# Patient Record
Sex: Female | Born: 1935 | Race: White | Hispanic: No | Marital: Married | Smoking: Never smoker
Health system: Southern US, Community
[De-identification: ages and names within clinical notes are randomized; demographics above are authoritative.]

## PROBLEM LIST (undated history)

## (undated) DIAGNOSIS — E875 Hyperkalemia: Secondary | ICD-10-CM

## (undated) DIAGNOSIS — K589 Irritable bowel syndrome without diarrhea: Secondary | ICD-10-CM

## (undated) DIAGNOSIS — I10 Essential (primary) hypertension: Secondary | ICD-10-CM

## (undated) DIAGNOSIS — M5412 Radiculopathy, cervical region: Secondary | ICD-10-CM

## (undated) DIAGNOSIS — C9590 Leukemia, unspecified not having achieved remission: Secondary | ICD-10-CM

## (undated) DIAGNOSIS — C801 Malignant (primary) neoplasm, unspecified: Secondary | ICD-10-CM

## (undated) DIAGNOSIS — D649 Anemia, unspecified: Secondary | ICD-10-CM

## (undated) DIAGNOSIS — E7800 Pure hypercholesterolemia, unspecified: Secondary | ICD-10-CM

## (undated) HISTORY — DX: Hyperkalemia: E87.5

## (undated) HISTORY — DX: Anemia, unspecified: D64.9

## (undated) HISTORY — DX: Irritable bowel syndrome, unspecified: K58.9

## (undated) HISTORY — DX: Pure hypercholesterolemia, unspecified: E78.00

## (undated) HISTORY — DX: Essential (primary) hypertension: I10

## (undated) HISTORY — PX: TONSILECTOMY, ADENOIDECTOMY, BILATERAL MYRINGOTOMY AND TUBES: SHX2538

## (undated) HISTORY — PX: OTHER SURGICAL HISTORY: SHX169

---

## 1997-10-27 ENCOUNTER — Other Ambulatory Visit: Admission: RE | Admit: 1997-10-27 | Discharge: 1997-10-27 | Payer: Self-pay | Admitting: Cardiology

## 1999-01-18 ENCOUNTER — Emergency Department (HOSPITAL_COMMUNITY): Admission: EM | Admit: 1999-01-18 | Discharge: 1999-01-18 | Payer: Self-pay | Admitting: Emergency Medicine

## 1999-01-19 ENCOUNTER — Encounter: Payer: Self-pay | Admitting: Neurology

## 1999-01-19 ENCOUNTER — Encounter: Payer: Self-pay | Admitting: Cardiology

## 1999-01-19 ENCOUNTER — Inpatient Hospital Stay (HOSPITAL_COMMUNITY): Admission: RE | Admit: 1999-01-19 | Discharge: 1999-01-23 | Payer: Self-pay | Admitting: Cardiology

## 1999-01-22 ENCOUNTER — Encounter: Payer: Self-pay | Admitting: Cardiology

## 1999-09-08 ENCOUNTER — Emergency Department (HOSPITAL_COMMUNITY): Admission: EM | Admit: 1999-09-08 | Discharge: 1999-09-08 | Payer: Self-pay | Admitting: Emergency Medicine

## 1999-09-09 ENCOUNTER — Encounter: Payer: Self-pay | Admitting: Emergency Medicine

## 2000-06-24 ENCOUNTER — Encounter: Payer: Self-pay | Admitting: Family Medicine

## 2000-06-24 ENCOUNTER — Encounter: Admission: RE | Admit: 2000-06-24 | Discharge: 2000-06-24 | Payer: Self-pay | Admitting: Family Medicine

## 2000-08-13 ENCOUNTER — Encounter (INDEPENDENT_AMBULATORY_CARE_PROVIDER_SITE_OTHER): Payer: Self-pay | Admitting: Specialist

## 2000-08-13 ENCOUNTER — Ambulatory Visit (HOSPITAL_COMMUNITY): Admission: RE | Admit: 2000-08-13 | Discharge: 2000-08-13 | Payer: Self-pay | Admitting: Gastroenterology

## 2001-11-27 ENCOUNTER — Encounter: Admission: RE | Admit: 2001-11-27 | Discharge: 2001-11-27 | Payer: Self-pay | Admitting: Gastroenterology

## 2001-11-27 ENCOUNTER — Encounter: Payer: Self-pay | Admitting: Gastroenterology

## 2002-02-23 ENCOUNTER — Encounter: Admission: RE | Admit: 2002-02-23 | Discharge: 2002-02-23 | Payer: Self-pay | Admitting: Family Medicine

## 2002-02-23 ENCOUNTER — Encounter: Payer: Self-pay | Admitting: Family Medicine

## 2002-12-30 ENCOUNTER — Ambulatory Visit (HOSPITAL_BASED_OUTPATIENT_CLINIC_OR_DEPARTMENT_OTHER): Admission: RE | Admit: 2002-12-30 | Discharge: 2002-12-30 | Payer: Self-pay | Admitting: Obstetrics and Gynecology

## 2002-12-30 ENCOUNTER — Encounter (INDEPENDENT_AMBULATORY_CARE_PROVIDER_SITE_OTHER): Payer: Self-pay | Admitting: Specialist

## 2003-07-13 ENCOUNTER — Inpatient Hospital Stay (HOSPITAL_COMMUNITY): Admission: EM | Admit: 2003-07-13 | Discharge: 2003-07-21 | Payer: Self-pay | Admitting: Emergency Medicine

## 2003-07-17 ENCOUNTER — Encounter (INDEPENDENT_AMBULATORY_CARE_PROVIDER_SITE_OTHER): Payer: Self-pay | Admitting: *Deleted

## 2003-07-25 ENCOUNTER — Encounter: Admission: RE | Admit: 2003-07-25 | Discharge: 2003-07-25 | Payer: Self-pay | Admitting: Family Medicine

## 2003-08-03 ENCOUNTER — Encounter: Admission: RE | Admit: 2003-08-03 | Discharge: 2003-08-03 | Payer: Self-pay | Admitting: Sports Medicine

## 2003-08-31 ENCOUNTER — Encounter: Admission: RE | Admit: 2003-08-31 | Discharge: 2003-08-31 | Payer: Self-pay | Admitting: Family Medicine

## 2003-09-20 ENCOUNTER — Encounter: Admission: RE | Admit: 2003-09-20 | Discharge: 2003-09-20 | Payer: Self-pay | Admitting: Sports Medicine

## 2003-10-05 ENCOUNTER — Encounter: Admission: RE | Admit: 2003-10-05 | Discharge: 2003-10-05 | Payer: Self-pay | Admitting: Sports Medicine

## 2003-11-22 ENCOUNTER — Encounter: Admission: RE | Admit: 2003-11-22 | Discharge: 2003-11-22 | Payer: Self-pay | Admitting: Sports Medicine

## 2003-12-29 ENCOUNTER — Encounter (INDEPENDENT_AMBULATORY_CARE_PROVIDER_SITE_OTHER): Payer: Self-pay | Admitting: *Deleted

## 2004-01-24 ENCOUNTER — Ambulatory Visit: Payer: Self-pay | Admitting: Sports Medicine

## 2004-02-23 ENCOUNTER — Emergency Department (HOSPITAL_COMMUNITY): Admission: EM | Admit: 2004-02-23 | Discharge: 2004-02-23 | Payer: Self-pay | Admitting: Emergency Medicine

## 2004-02-24 ENCOUNTER — Ambulatory Visit: Payer: Self-pay | Admitting: Sports Medicine

## 2004-03-14 ENCOUNTER — Ambulatory Visit: Payer: Self-pay | Admitting: Sports Medicine

## 2004-06-06 ENCOUNTER — Ambulatory Visit: Payer: Self-pay | Admitting: Sports Medicine

## 2004-07-04 ENCOUNTER — Ambulatory Visit: Payer: Self-pay | Admitting: Sports Medicine

## 2004-10-02 ENCOUNTER — Ambulatory Visit: Payer: Self-pay | Admitting: Sports Medicine

## 2005-01-29 ENCOUNTER — Ambulatory Visit: Payer: Self-pay | Admitting: Sports Medicine

## 2005-04-30 ENCOUNTER — Ambulatory Visit: Payer: Self-pay | Admitting: Sports Medicine

## 2005-08-06 ENCOUNTER — Ambulatory Visit: Payer: Self-pay | Admitting: Sports Medicine

## 2005-08-06 LAB — CONVERTED CEMR LAB
Cholesterol: 196 mg/dL
LDL Cholesterol: 112 mg/dL
Triglycerides: 76 mg/dL

## 2005-08-19 ENCOUNTER — Ambulatory Visit: Payer: Self-pay | Admitting: Family Medicine

## 2005-11-14 ENCOUNTER — Ambulatory Visit: Payer: Self-pay | Admitting: Family Medicine

## 2006-01-15 ENCOUNTER — Ambulatory Visit: Payer: Self-pay | Admitting: Family Medicine

## 2006-01-21 ENCOUNTER — Ambulatory Visit (HOSPITAL_COMMUNITY): Admission: RE | Admit: 2006-01-21 | Discharge: 2006-01-21 | Payer: Self-pay | Admitting: Family Medicine

## 2006-01-21 ENCOUNTER — Ambulatory Visit: Payer: Self-pay | Admitting: Sports Medicine

## 2006-02-04 ENCOUNTER — Ambulatory Visit: Payer: Self-pay | Admitting: Sports Medicine

## 2006-04-01 ENCOUNTER — Ambulatory Visit: Payer: Self-pay | Admitting: Sports Medicine

## 2006-06-26 DIAGNOSIS — G609 Hereditary and idiopathic neuropathy, unspecified: Secondary | ICD-10-CM | POA: Insufficient documentation

## 2006-06-26 DIAGNOSIS — E785 Hyperlipidemia, unspecified: Secondary | ICD-10-CM

## 2006-06-26 DIAGNOSIS — E119 Type 2 diabetes mellitus without complications: Secondary | ICD-10-CM

## 2006-06-26 DIAGNOSIS — I10 Essential (primary) hypertension: Secondary | ICD-10-CM

## 2006-06-26 DIAGNOSIS — E871 Hypo-osmolality and hyponatremia: Secondary | ICD-10-CM

## 2006-06-27 ENCOUNTER — Encounter (INDEPENDENT_AMBULATORY_CARE_PROVIDER_SITE_OTHER): Payer: Self-pay | Admitting: *Deleted

## 2006-07-17 ENCOUNTER — Encounter (INDEPENDENT_AMBULATORY_CARE_PROVIDER_SITE_OTHER): Payer: Self-pay | Admitting: Sports Medicine

## 2006-08-26 ENCOUNTER — Ambulatory Visit: Payer: Self-pay | Admitting: Sports Medicine

## 2006-08-27 ENCOUNTER — Telehealth: Payer: Self-pay | Admitting: *Deleted

## 2006-09-02 ENCOUNTER — Ambulatory Visit: Payer: Self-pay | Admitting: Vascular Surgery

## 2006-09-02 ENCOUNTER — Encounter: Payer: Self-pay | Admitting: Vascular Surgery

## 2006-09-02 ENCOUNTER — Ambulatory Visit (HOSPITAL_COMMUNITY): Admission: RE | Admit: 2006-09-02 | Discharge: 2006-09-02 | Payer: Self-pay | Admitting: Sports Medicine

## 2006-09-15 ENCOUNTER — Encounter (INDEPENDENT_AMBULATORY_CARE_PROVIDER_SITE_OTHER): Payer: Self-pay | Admitting: Sports Medicine

## 2006-10-27 ENCOUNTER — Ambulatory Visit: Payer: Self-pay | Admitting: Family Medicine

## 2006-10-27 LAB — CONVERTED CEMR LAB
ALT: 38 units/L — ABNORMAL HIGH (ref 0–35)
AST: 36 units/L (ref 0–37)
Alkaline Phosphatase: 25 units/L — ABNORMAL LOW (ref 39–117)
Potassium: 5.1 meq/L (ref 3.5–5.3)
Sodium: 142 meq/L (ref 135–145)
Total Bilirubin: 0.7 mg/dL (ref 0.3–1.2)
Total Protein: 6.9 g/dL (ref 6.0–8.3)

## 2006-10-28 ENCOUNTER — Encounter: Payer: Self-pay | Admitting: Family Medicine

## 2007-02-03 ENCOUNTER — Ambulatory Visit: Payer: Self-pay | Admitting: Family Medicine

## 2007-02-03 ENCOUNTER — Telehealth (INDEPENDENT_AMBULATORY_CARE_PROVIDER_SITE_OTHER): Payer: Self-pay | Admitting: *Deleted

## 2007-03-05 ENCOUNTER — Telehealth: Payer: Self-pay | Admitting: Family Medicine

## 2007-03-11 ENCOUNTER — Ambulatory Visit: Payer: Self-pay | Admitting: Family Medicine

## 2007-06-17 ENCOUNTER — Ambulatory Visit: Payer: Self-pay | Admitting: Family Medicine

## 2007-06-23 ENCOUNTER — Telehealth: Payer: Self-pay | Admitting: Family Medicine

## 2007-09-01 ENCOUNTER — Encounter: Payer: Self-pay | Admitting: Family Medicine

## 2007-09-02 ENCOUNTER — Encounter: Payer: Self-pay | Admitting: Family Medicine

## 2007-09-02 LAB — CONVERTED CEMR LAB
HDL: 62 mg/dL
LDL Cholesterol: 104 mg/dL
Total CHOL/HDL Ratio: 2.9
Triglycerides: 67 mg/dL
VLDL: 13 mg/dL

## 2007-09-22 ENCOUNTER — Ambulatory Visit: Payer: Self-pay | Admitting: Family Medicine

## 2007-09-22 DIAGNOSIS — F411 Generalized anxiety disorder: Secondary | ICD-10-CM | POA: Insufficient documentation

## 2007-10-19 ENCOUNTER — Telehealth: Payer: Self-pay | Admitting: *Deleted

## 2007-11-30 ENCOUNTER — Encounter: Payer: Self-pay | Admitting: Family Medicine

## 2008-01-07 ENCOUNTER — Encounter: Payer: Self-pay | Admitting: Family Medicine

## 2008-03-30 ENCOUNTER — Ambulatory Visit: Payer: Self-pay | Admitting: Family Medicine

## 2008-04-01 ENCOUNTER — Encounter: Payer: Self-pay | Admitting: Family Medicine

## 2008-04-08 ENCOUNTER — Encounter: Payer: Self-pay | Admitting: Family Medicine

## 2008-04-12 ENCOUNTER — Encounter: Payer: Self-pay | Admitting: Family Medicine

## 2008-04-12 ENCOUNTER — Telehealth: Payer: Self-pay | Admitting: *Deleted

## 2008-04-12 ENCOUNTER — Ambulatory Visit: Payer: Self-pay | Admitting: Family Medicine

## 2008-04-12 LAB — CONVERTED CEMR LAB
ALT: 21 units/L (ref 0–35)
Alkaline Phosphatase: 29 units/L — ABNORMAL LOW (ref 39–117)
Bilirubin Urine: NEGATIVE
CO2: 24 meq/L (ref 19–32)
Nitrite: NEGATIVE
Protein, U semiquant: NEGATIVE
Sodium: 140 meq/L (ref 135–145)
Total Bilirubin: 0.5 mg/dL (ref 0.3–1.2)
Total Protein: 7 g/dL (ref 6.0–8.3)
Urobilinogen, UA: 0.2

## 2008-04-25 ENCOUNTER — Encounter: Payer: Self-pay | Admitting: Family Medicine

## 2008-04-26 ENCOUNTER — Encounter: Payer: Self-pay | Admitting: Family Medicine

## 2008-09-09 ENCOUNTER — Encounter: Payer: Self-pay | Admitting: Family Medicine

## 2008-09-19 ENCOUNTER — Encounter: Payer: Self-pay | Admitting: Family Medicine

## 2008-09-29 ENCOUNTER — Ambulatory Visit: Payer: Self-pay | Admitting: Family Medicine

## 2008-09-29 ENCOUNTER — Telehealth: Payer: Self-pay | Admitting: *Deleted

## 2008-09-29 DIAGNOSIS — N952 Postmenopausal atrophic vaginitis: Secondary | ICD-10-CM

## 2008-09-29 LAB — CONVERTED CEMR LAB
Bilirubin Urine: NEGATIVE
Nitrite: POSITIVE
Protein, U semiquant: >=300
Specific Gravity, Urine: 1.015

## 2008-09-30 ENCOUNTER — Encounter: Payer: Self-pay | Admitting: Family Medicine

## 2008-10-02 ENCOUNTER — Telehealth: Payer: Self-pay | Admitting: Family Medicine

## 2008-10-03 ENCOUNTER — Telehealth: Payer: Self-pay | Admitting: Family Medicine

## 2008-10-06 ENCOUNTER — Telehealth: Payer: Self-pay | Admitting: Family Medicine

## 2008-10-06 ENCOUNTER — Encounter: Payer: Self-pay | Admitting: Family Medicine

## 2008-10-07 ENCOUNTER — Ambulatory Visit: Payer: Self-pay | Admitting: Family Medicine

## 2008-10-07 LAB — CONVERTED CEMR LAB
Bilirubin Urine: NEGATIVE
Glucose, Urine, Semiquant: NEGATIVE
Ketones, urine, test strip: NEGATIVE
Specific Gravity, Urine: <1.005
pH: 6

## 2008-10-10 ENCOUNTER — Telehealth: Payer: Self-pay | Admitting: Family Medicine

## 2008-10-13 ENCOUNTER — Encounter: Payer: Self-pay | Admitting: Family Medicine

## 2008-10-17 ENCOUNTER — Encounter: Payer: Self-pay | Admitting: Family Medicine

## 2008-10-19 ENCOUNTER — Encounter: Payer: Self-pay | Admitting: Family Medicine

## 2008-10-27 ENCOUNTER — Telehealth: Payer: Self-pay | Admitting: Family Medicine

## 2008-10-28 ENCOUNTER — Ambulatory Visit: Payer: Self-pay | Admitting: Family Medicine

## 2008-11-16 ENCOUNTER — Ambulatory Visit: Payer: Self-pay | Admitting: Family Medicine

## 2008-11-16 DIAGNOSIS — K644 Residual hemorrhoidal skin tags: Secondary | ICD-10-CM | POA: Insufficient documentation

## 2010-02-27 ENCOUNTER — Ambulatory Visit: Payer: Self-pay | Admitting: Cardiology

## 2010-02-27 ENCOUNTER — Encounter: Payer: Self-pay | Admitting: Family Medicine

## 2010-03-19 ENCOUNTER — Ambulatory Visit: Payer: Self-pay | Admitting: Family Medicine

## 2010-03-19 DIAGNOSIS — I10 Essential (primary) hypertension: Secondary | ICD-10-CM | POA: Insufficient documentation

## 2010-03-30 ENCOUNTER — Encounter: Payer: Self-pay | Admitting: Family Medicine

## 2010-04-02 ENCOUNTER — Encounter: Payer: Self-pay | Admitting: Family Medicine

## 2010-04-17 ENCOUNTER — Telehealth: Payer: Self-pay | Admitting: Family Medicine

## 2010-05-09 ENCOUNTER — Ambulatory Visit
Admission: RE | Admit: 2010-05-09 | Discharge: 2010-05-09 | Payer: Self-pay | Source: Home / Self Care | Attending: Family Medicine | Admitting: Family Medicine

## 2010-05-09 DIAGNOSIS — H6120 Impacted cerumen, unspecified ear: Secondary | ICD-10-CM | POA: Insufficient documentation

## 2010-05-17 ENCOUNTER — Encounter: Payer: Self-pay | Admitting: Cardiology

## 2010-05-17 DIAGNOSIS — E7800 Pure hypercholesterolemia, unspecified: Secondary | ICD-10-CM | POA: Insufficient documentation

## 2010-05-17 DIAGNOSIS — E875 Hyperkalemia: Secondary | ICD-10-CM | POA: Insufficient documentation

## 2010-05-17 DIAGNOSIS — K589 Irritable bowel syndrome without diarrhea: Secondary | ICD-10-CM | POA: Insufficient documentation

## 2010-05-27 LAB — CONVERTED CEMR LAB
CO2: 30 meq/L (ref 19–32)
GFR calc non Af Amer: 79.78 mL/min (ref >60)
Glucose, Bld: 98 mg/dL (ref 70–99)
Potassium: 5 meq/L (ref 3.5–5.1)
Sodium: 136 meq/L (ref 135–145)

## 2010-05-31 NOTE — Letter (Signed)
Summary: [facility]  [facility]   Imported By: Maryln Gottron 04/05/2010 13:05:48  _____________________________________________________________________  External Attachment:    Type:   Image     Comment:   External Document

## 2010-05-31 NOTE — Progress Notes (Signed)
Summary: bone density results  Phone Note Call from Patient Call back at Home Phone [phone]   Caller: Patient----triage vm Reason for Call: Lab or Test Results Summary of Call: need bone density results. Initial call taken by: Warnell Forester,  April 17, 2010 9:38 AM  Follow-up for Phone Call        bone density scanned to chart 11/21 [facility] LPN  April 18, 2010 9:37 AM Osteopenia but no osteoporosis.  cont with Fosamax, Ca , Vit D and exercise. Repeat DEXA in 2 years. Follow-up by: Evelena Peat MD,  April 19, 2010 8:42 AM  Additional Follow-up for Phone Call Additional follow up Details #1::        Pt informed Additional Follow-up by: Sid Falcon LPN,  April 19, 2010 9:47 AM

## 2010-05-31 NOTE — Assessment & Plan Note (Signed)
Summary: hearing loss one ear/dm   Vital Signs:  Patient profile:   77 year old female Menstrual status:  postmenopausal Weight:      106 pounds Temp:     98.3 degrees F oral BP sitting:   150 / 68  (left arm) Cuff size:   regular  Vitals Entered By: Sid Falcon LPN (May 09, 2010 1:02 PM)  History of Present Illness: Decrease hearing R ear for one week. No dizziness.  No L ear symptoms. Chronic sensorineural hearing loss with bil hearing aids.  Hx cerumen build up in past.  Hypertension History:      She denies headache, chest pain, palpitations, dyspnea with exertion, peripheral edema, visual symptoms, neurologic problems, and syncope.        Positive major cardiovascular risk factors include female age 27 years old or older, diabetes, hyperlipidemia, and hypertension.  Negative major cardiovascular risk factors include non-tobacco-user status.        Further assessment for target organ damage reveals no history of ASHD, stroke/TIA, or peripheral vascular disease.     Allergies (verified): No Known Drug Allergies  Past History:  Past Medical History: Last updated: 03/19/2010 hematuria--dr. Earlene Plater,  hyperkalemia-- ace?,  hyponatremia --siadh (hctz),  increased ck on statin/glucophage--now tolerating low dose simvastatin Ol (04/2007), peripheral neuropathy ?osteoporosis Hypertension PMH reviewed for relevance  Physical Exam  General:  Well-developed,well-nourished,in no acute distress; alert,appropriate and cooperative throughout examination Ears:  Cerumen R canal.  L with minimal cerumen. Removed with irrigation.  Mouth:  Oral mucosa and oropharynx without lesions or exudates.  Teeth in good repair. Neck:  No deformities, masses, or tenderness noted. Lungs:  Normal respiratory effort, chest expands symmetrically. Lungs are clear to auscultation, no crackles or wheezes. Heart:  Normal rate and regular rhythm. S1 and S2 normal without gallop, murmur, click, rub or  other extra sounds.   Impression & Recommendations:  Problem # 1:  CERUMEN IMPACTION (ICD-380.4) will irrigate to remove.  Problem # 2:  HYPERTENSION (ICD-401.9)  Her updated medication list for this problem includes:    Lopressor 50 Mg Tabs (Metoprolol tartrate) .Marland Kitchen... 1 by mouth bid    Norvasc 10 Mg Tabs (Amlodipine besylate) .Marland Kitchen... 1 tablet by mouth daily for blood pressure  Complete Medication List: 1)  Aspirin Childrens 81 Mg Chew (Aspirin) .... Take 1 tablet by mouth once a day 2)  Fosamax 70 Mg Tabs (Alendronate sodium) .Marland Kitchen.. 1 by mouth q week 3)  Lopressor 50 Mg Tabs (Metoprolol tartrate) .Marland Kitchen.. 1 by mouth bid 4)  Onetouch Ultra Test Strp (Glucose blood) .... Test daily day as directed dx diabetes mellitus 250.0 5)  Onetouch Ultrasoft Lancets Misc (Lancets) .... Daily and as needed 6)  Simvastatin 5 Mg Tabs (Simvastatin) .Marland Kitchen.. 1 by mouth qd 7)  Norvasc 10 Mg Tabs (Amlodipine besylate) .Marland Kitchen.. 1 tablet by mouth daily for blood pressure 8)  Triamcinolone Acetonide 0.1 % Crea (Triamcinolone acetonide) .... Apply to itchy area two times a day until improved, as needed 9)  Proctozone-hc 2.5 % Crea (Hydrocortisone) .Marland Kitchen.. 1 applicator full per rectum two times a day for 3-5 days as needed hemorrhoid pain as needed 10)  Gabapentin 100 Mg Caps (Gabapentin) .... Sig 1 tab by mouth at bedtime; may increase to two tabs by mouth at bedtime if needed  Hypertension Assessment/Plan:      The patient's hypertensive risk group is category C: Target organ damage and/or diabetes.  Her calculated 10 year risk of coronary heart disease is 17 %.  Today's blood pressure is 150/68.     Orders Added: 1)  Est. Patient Level III [16109]

## 2010-05-31 NOTE — Assessment & Plan Note (Signed)
Summary: BRAND NEW PT/TO EST/PT REQ CPX/PT BRINGING LAB RESULTS TO OV/C[facility]   Vital Signs:  Patient profile:   77 year old female Menstrual status:  postmenopausal Height:      64.25 inches Weight:      106 pounds BMI:     18.12 Temp:     99.0 degrees F oral Pulse rate:   80 / minute Pulse rhythm:   regular Resp:     12 per minute BP sitting:   150 / 68  (left arm) Cuff size:   regular  Vitals Entered By: Sid Falcon LPN (March 19, 2010 10:53 AM)  History of Present Illness: Patient is new to this clinic to establish care.  She has chronic problems including history of type 2 diabetes which is diet controlled, hyperlipidemia, hypertension, history of hyponatremia, and reported history of peripheral neuropathy. Her chronic medications are reviewed. She is no longer taking gabapentin secondary to sedation. Neuropathy pains are minimal.  Her lipids are followed by cardiologist as she had recent lipid panel about 2 weeks ago  She is requesting Medicare wellness evaluation. Mammogram scheduled for December. She takes Fosamax and tolerating well . Last DEXA she states over 2 years ago. Takes some calcium and vitamin D. Her vaccine hx is reviewed. Pneumovax less than 5 years ago.  Here for Medicare AWV:  1.   Risk factors based on Past M, S, F history:  As above, hx hypertension, hyperlipidemia, Type 2 DM, osteoporosis.  No hx of smoking or ETOH. 2.   Physical Activities: walks frequently 3.   Depression/mood: No recent depression or anxiety concerns. 4.   Hearing: No recent changes.  No problems with gross hearing. 5.   ADL's: Fully  independent in all. 6.   Fall Risk: Low.  She walks regularly and has no orthopedic, vestibular, or visual major risks. 7.   Home Safety: no concerns identified. 8.   Height, weight, &visual acuity:  She is new but reports no recent weight changes or visual changes. 9.   Counseling: Cont with weight bearing exercise and adeq Calcium and Vit D  intake. 10.   Labs ordered based on risk factors: BMP, A1C. 11.           Referral Coordination  None needed at this time. 12.           Care Plan  Mammogram for Dec and will order DEXA for same time.  Immunizations and colonoscopy are up to date. 13.            Cognitive Assessment  No impariment in long or short term memory.  Mood is stabel. No impairment in reasoning or cognitive skills.   Diabetes Management History:      She has not been enrolled in the "Diabetic Education Program".  She states understanding of dietary principles and is following her diet appropriately.  No sensory loss is reported.  Self foot exams are being performed.  She is checking home blood sugars.  She says that she is exercising.  Type of exercise includes: walking.  Duration of exercise is estimated to be 20 min.  She is doing this 5 times per week.        Hypoglycemic symptoms are not occurring.  No hyperglycemic symptoms are reported.    Hypertension History:      She denies headache, chest pain, palpitations, dyspnea with exertion, orthopnea, PND, peripheral edema, visual symptoms, and syncope.        Positive major cardiovascular risk  factors include female age 51 years old or older, diabetes, hyperlipidemia, and hypertension.  Negative major cardiovascular risk factors include non-tobacco-user status.        Further assessment for target organ damage reveals no history of ASHD, stroke/TIA, or peripheral vascular disease.    Lipid Management History:      Positive NCEP/ATP III risk factors include female age 20 years old or older, diabetes, and hypertension.  Negative NCEP/ATP III risk factors include HDL cholesterol greater than 60, non-tobacco-user status, no ASHD (atherosclerotic heart disease), no prior stroke/TIA, no peripheral vascular disease, and no history of aortic aneurysm.      Clinical Review Panels:  Prevention   Last Mammogram:  normal (01/28/2008)   Last Pap Smear:  not indicated  (03/30/2008)   Last Colonoscopy:  normal (04/29/2006)  Immunizations   Last Flu Vaccine:  Historical (02/27/2010)   Last Pneumovax:  Pneumovax (08/26/2006)   Last Zoster Vaccine:  Zostavax (08/26/2006)   Allergies (verified): No Known Drug Allergies  Past History:  Family History: Last updated: 08/26/2006 Family History of Cardiovascular disorder  Social History: Last updated: 06/26/2006 Lives with husband who is disabled; very functional; no smoking or alcohol  Risk Factors: Exercise: yes (08/26/2006)  Risk Factors: Smoking Status: never (11/16/2008)  Past Medical History: hematuria--dr. Earlene Plater,  hyperkalemia-- ace?,  hyponatremia --siadh (hctz),  increased ck on statin/glucophage--now tolerating low dose simvastatin Ol (04/2007), peripheral neuropathy ?osteoporosis Hypertension  Review of Systems  The patient denies anorexia, fever, weight loss, weight gain, vision loss, decreased hearing, hoarseness, chest pain, syncope, dyspnea on exertion, peripheral edema, prolonged cough, headaches, hemoptysis, abdominal pain, melena, hematochezia, severe indigestion/heartburn, hematuria, incontinence, genital sores, muscle weakness, suspicious skin lesions, transient blindness, difficulty walking, depression, unusual weight change, abnormal bleeding, enlarged lymph nodes, and breast masses.    Physical Exam  General:  Well-developed,well-nourished,in no acute distress; alert,appropriate and cooperative throughout examination Head:  Normocephalic and atraumatic without obvious abnormalities. No apparent alopecia or balding. Eyes:  pupils equal, pupils round, and pupils reactive to light.   Ears:  minimal cerumen in both ear canals Mouth:  Oral mucosa and oropharynx without lesions or exudates.  Teeth in good repair. Neck:  No deformities, masses, or tenderness noted. Breasts:  No mass, nodules, thickening, tenderness, bulging, retraction, inflamation, nipple discharge or skin  changes noted.   Lungs:  Normal respiratory effort, chest expands symmetrically. Lungs are clear to auscultation, no crackles or wheezes. Heart:  normal rate and regular rhythm.   Abdomen:  soft, non-tender, and no masses.   Genitalia:  deferred sec to age (no indication for pap) Msk:  No deformity or scoliosis noted of thoracic or lumbar spine.   Extremities:  No clubbing, cyanosis, edema, or deformity noted with normal full range of motion of all joints.   Neurologic:  alert & oriented X3, cranial nerves II-XII intact, strength normal in all extremities, and sensation intact to light touch.   Skin:  no rashes and no suspicious lesions.   Cervical Nodes:  No lymphadenopathy noted Psych:  normally interactive, good eye contact, not anxious appearing, and not depressed appearing.    Diabetes Management Exam:    Foot Exam (with socks and/or shoes not present):       Sensory-Pinprick/Light touch:          Left medial foot (L-4): normal          Left dorsal foot (L-5): normal          Left lateral foot (S-1): normal  Right medial foot (L-4): normal          Right dorsal foot (L-5): normal          Right lateral foot (S-1): normal       Sensory-Monofilament:          Left foot: normal          Right foot: normal       Inspection:          Left foot: normal          Right foot: normal       Nails:          Left foot: normal          Right foot: normal    Eye Exam:       Eye Exam done here today          Results: normal   Impression & Recommendations:  Problem # 1:  Preventive Health Care (ICD-V70.0) Mammogram and DEXA.  Cont calcium and Vit D.  Cont weight bearing exercise.  Problem # 2:  HYPERTENSION (ICD-401.9)  Her updated medication list for this problem includes:    Lopressor 50 Mg Tabs (Metoprolol tartrate) .Marland Kitchen... 1 by mouth bid    Norvasc 10 Mg Tabs (Amlodipine besylate) .Marland Kitchen... 1 tablet by mouth daily for blood pressure  Problem # 3:  HYPONATREMIA  (ICD-276.1)  Orders: TLB-BMP (Basic Metabolic Panel-BMET) (80048-METABOL)  Problem # 4:  HYPERLIPIDEMIA (ICD-272.4)  Her updated medication list for this problem includes:    Simvastatin 5 Mg Tabs (Simvastatin) .Marland Kitchen... 1 by mouth qd  Orders: Venipuncture (16109) Specimen Handling (60454)  Problem # 5:  DIABETES MELLITUS II, UNCOMPLICATED (ICD-250.00)  Her updated medication list for this problem includes:    Aspirin Childrens 81 Mg Chew (Aspirin) .Marland Kitchen... Take 1 tablet by mouth once a day  Orders: Venipuncture (09811) Specimen Handling (91478) TLB-A1C / Hgb A1C (Glycohemoglobin) (83036-A1C)  Complete Medication List: 1)  Aspirin Childrens 81 Mg Chew (Aspirin) .... Take 1 tablet by mouth once a day 2)  Fosamax 70 Mg Tabs (Alendronate sodium) .Marland Kitchen.. 1 by mouth q week 3)  Lopressor 50 Mg Tabs (Metoprolol tartrate) .Marland Kitchen.. 1 by mouth bid 4)  Onetouch Ultra Test Strp (Glucose blood) .... Use to check blood sugar two times a day as directed dx diabetes mellitus 250.0 5)  Onetouch Ultrasoft Lancets Misc (Lancets) .... Daily and as needed 6)  Simvastatin 5 Mg Tabs (Simvastatin) .Marland Kitchen.. 1 by mouth qd 7)  Norvasc 10 Mg Tabs (Amlodipine besylate) .Marland Kitchen.. 1 tablet by mouth daily for blood pressure 8)  Triamcinolone Acetonide 0.1 % Crea (Triamcinolone acetonide) .... Apply to itchy area two times a day until improved, as needed 9)  Proctozone-hc 2.5 % Crea (Hydrocortisone) .Marland Kitchen.. 1 applicator full per rectum two times a day for 3-5 days as needed hemorrhoid pain as needed 10)  Gabapentin 100 Mg Caps (Gabapentin) .... Sig 1 tab by mouth at bedtime; may increase to two tabs by mouth at bedtime if needed  Other Orders: Medicare -1st Annual Wellness Visit [phone])  Diabetes Management Assessment/Plan:      The following lipid goals have been established for the patient: Total cholesterol goal of 200; LDL cholesterol goal of 100; HDL cholesterol goal of 40; Triglyceride goal of 150.    Hypertension  Assessment/Plan:      The patient's hypertensive risk group is category C: Target organ damage and/or diabetes.  Her calculated 10 year risk of coronary heart disease is 17 %.  Today's blood pressure is 150/68.    Lipid Assessment/Plan:      Based on NCEP/ATP III, the patient's risk factor category is "history of diabetes".  The patient's lipid goals are as follows: Total cholesterol goal is 200; LDL cholesterol goal is 100; HDL cholesterol goal is 40; Triglyceride goal is 150.    Patient Instructions: 1)  Please schedule a follow-up appointment in 4 months .    Orders Added: 1)  Venipuncture [13244] 2)  Specimen Handling [99000] 3)  TLB-BMP (Basic Metabolic Panel-BMET) [80048-METABOL] 4)  TLB-A1C / Hgb A1C (Glycohemoglobin) [83036-A1C] 5)  Medicare -1st Annual Wellness Visit [G0438] 6)  New Patient Level III [01027]   Immunization History:  Influenza Immunization History:    Influenza:  historical (02/27/2010)   Immunization History:  Influenza Immunization History:    Influenza:  Historical (02/27/2010)  Preventive Care Screening  Colonoscopy:    Date:  04/29/2006    Results:  normal

## 2010-06-25 ENCOUNTER — Encounter: Payer: Self-pay | Admitting: Family Medicine

## 2010-06-25 ENCOUNTER — Ambulatory Visit (INDEPENDENT_AMBULATORY_CARE_PROVIDER_SITE_OTHER): Payer: MEDICARE | Admitting: Family Medicine

## 2010-06-25 VITALS — BP 170/72 | Temp 98.8°F | Ht 65.0 in | Wt 103.0 lb

## 2010-06-25 DIAGNOSIS — S80819A Abrasion, unspecified lower leg, initial encounter: Secondary | ICD-10-CM

## 2010-06-25 DIAGNOSIS — E119 Type 2 diabetes mellitus without complications: Secondary | ICD-10-CM

## 2010-06-25 DIAGNOSIS — IMO0002 Reserved for concepts with insufficient information to code with codable children: Secondary | ICD-10-CM

## 2010-06-25 NOTE — Progress Notes (Signed)
  Subjective:    Patient ID: Meredith Rodriguez, female    DOB: [birth year], 77 y.o.   MRN: [id number]  HPI  patient seen with abrasion right shin. Occurred almost one week ago. Patient has type II diabetes. She fell going down some stairs. No syncope. Large abrasion which is for the most part healing well. Mild surrounding erythema. No pain with ambulation. Chronic neuropathy presumably secondary to type 2 diabetes. Recent blood sugar last night postprandial 211. No symptoms of hyperglycemia. Last tetanus less than 10 years ago   Review of Systems     Objective:   Physical Exam . Patient is alert and in no distress Chest clear to auscultation Heart regular rhythm and rate  right lower extremity reveals large abrasion. She has area of minimal erythema surrounding the wound.  No drainage. Minimally tender to palpation. Not warm to touch       Assessment & Plan:   abrasion right shin. Minimal surrounding erythema. Reassess 2 days.  Consider antibiotic for any progressive erythema. Type 2 diabetes which has been well controlled recent A1c 6.5%   Wound care estrogen given. Because of the water. Leave off alcohol or peroxide. Frequent elevation and heating pad the leg. Recess 2 days

## 2010-06-25 NOTE — Patient Instructions (Signed)
Keep wound clean with soap and water. No peroxide or alcohol. Keep leg elevated as much as possible. Follow up in 2 days and sooner if any fever or progressive redness.

## 2010-06-27 ENCOUNTER — Encounter: Payer: Self-pay | Admitting: Family Medicine

## 2010-06-27 ENCOUNTER — Ambulatory Visit (INDEPENDENT_AMBULATORY_CARE_PROVIDER_SITE_OTHER): Payer: MEDICARE | Admitting: Family Medicine

## 2010-06-27 DIAGNOSIS — E785 Hyperlipidemia, unspecified: Secondary | ICD-10-CM

## 2010-06-27 DIAGNOSIS — S80819A Abrasion, unspecified lower leg, initial encounter: Secondary | ICD-10-CM

## 2010-06-27 DIAGNOSIS — IMO0002 Reserved for concepts with insufficient information to code with codable children: Secondary | ICD-10-CM

## 2010-06-27 DIAGNOSIS — I10 Essential (primary) hypertension: Secondary | ICD-10-CM

## 2010-06-27 NOTE — Progress Notes (Signed)
  Subjective:    Patient ID: Meredith Rodriguez, female    DOB: 1926-04-03, 77 y.o.   MRN: [id number]  HPI  Patient here for followup.   Leg abrasion healing well. Never went on antibiotics. Less erythema. She started developed some itching. Minimal pain. No fever or chills. No drainage. Cleaning with soap and water.    patient has history osteoporosis. Several questions regarding Fosamax. Has taken this for many years consecutively. Remains on calcium and vitamin D. No recent fractures. DEXA scan last December T score -2.2 the hip. Patient would like to come off Fosamax for short-term  Hyperlipidemia treated with very low dose simvastatin 5 mg. No side effects. This has been prescribed by cardiologist.   Review of Systems  Constitutional: Negative for fever, chills, activity change and appetite change.  Eyes: Negative for visual disturbance.  Respiratory: Negative for cough and shortness of breath.   Cardiovascular: Negative for chest pain and palpitations.  Gastrointestinal: Negative for abdominal pain.  Neurological: Negative for weakness.  Psychiatric/Behavioral: Negative for confusion and dysphoric mood.     Objective:   Physical Exam    patient is alert and in no distress blood pressure. Rest 150/68 Oropharynx is clear Neck supple no adenopathy Chest clear to auscultation Heart regular rhythm and rate Extremities right anterior shin reveals elongated eschar. Pinkish discoloration about 1 cm periphery but no warmth       Assessment & Plan:   #1 leg abrasion healing well with no evidence for cystic cellulitis  #2 osteoporosis history. Actually osteopenia by recent DEXA scan.   She will consider leaving off Fosamax after next month at least for the next year or 2. Discussed other measures to reduce osteoporosis risk #3 hyperlipidemia continue low-dose  Simvastatin. #4 hypertension

## 2010-06-27 NOTE — Patient Instructions (Signed)
Leave off Fosamax for now. Continue good wound care as discussed.

## 2010-07-18 ENCOUNTER — Encounter: Payer: Self-pay | Admitting: Cardiology

## 2010-07-18 ENCOUNTER — Ambulatory Visit (INDEPENDENT_AMBULATORY_CARE_PROVIDER_SITE_OTHER): Payer: MEDICARE | Admitting: Cardiology

## 2010-07-18 ENCOUNTER — Other Ambulatory Visit: Payer: Self-pay | Admitting: Cardiology

## 2010-07-18 DIAGNOSIS — E7800 Pure hypercholesterolemia, unspecified: Secondary | ICD-10-CM

## 2010-07-18 DIAGNOSIS — I10 Essential (primary) hypertension: Secondary | ICD-10-CM

## 2010-07-18 DIAGNOSIS — I119 Hypertensive heart disease without heart failure: Secondary | ICD-10-CM

## 2010-07-18 DIAGNOSIS — E119 Type 2 diabetes mellitus without complications: Secondary | ICD-10-CM

## 2010-07-18 DIAGNOSIS — Z79899 Other long term (current) drug therapy: Secondary | ICD-10-CM

## 2010-07-18 LAB — LIPID PANEL
LDL Cholesterol: 73 mg/dL (ref 0–99)
Triglycerides: 63 mg/dL (ref <150)

## 2010-07-18 LAB — COMPREHENSIVE METABOLIC PANEL
ALT: 19 U/L (ref 0–35)
Albumin: 4.5 g/dL (ref 3.5–5.2)
Alkaline Phosphatase: 19 U/L — ABNORMAL LOW (ref 39–117)
CO2: 27 mEq/L (ref 19–32)
Glucose, Bld: 119 mg/dL — ABNORMAL HIGH (ref 70–99)
Potassium: 4.7 mEq/L (ref 3.5–5.3)
Sodium: 139 mEq/L (ref 135–145)
Total Protein: 6.7 g/dL (ref 6.0–8.3)

## 2010-07-18 LAB — HEMOGLOBIN A1C
Hgb A1c MFr Bld: 6.4 % — ABNORMAL HIGH (ref <5.7)
Mean Plasma Glucose: 137 mg/dL — ABNORMAL HIGH (ref <117)

## 2010-07-18 NOTE — Progress Notes (Signed)
History of Present Illness: This pleasant elderly Caucasian female is seen for a four-month followup office visit.  She has a history of essential hypertension and diabetes mellitus.  She has an intolerance to ACE inhibitors because of hyperkalemia.  Since last visit she has had a GI workup from Dr. Randa Evens for complaints of irritable bowel and questionable giardia parasites infestation.  She has had a trial of Flagyl which has helped somewhat.  She is scheduled for colonoscopy on 08/14/10.  She is diabetic.  She has not been expressing any hypoglycemic episodes.  She denies cough or sputum production.  She's had no chest pain or shortness of breath.  She has had frequent urinary tract infections but is not presently on any antibiotics.  She denies cough or sputum production.  Current Outpatient Prescriptions  Medication Sig Dispense Refill  . alendronate (FOSAMAX) 70 MG tablet Take 70 mg by mouth every 7 (seven) days. Take in the morning with a full glass of water, on an empty stomach, and do not take anything else by mouth or lie down for the next 30 min.       Marland Kitchen amLODipine (NORVASC) 5 MG tablet Take 5 mg by mouth daily.        . Ascorbic Acid (VITAMIN C) 500 MG tablet Take 500 mg by mouth daily.        Marland Kitchen aspirin 81 MG tablet Take 81 mg by mouth daily.        . metoprolol (LOPRESSOR) 50 MG tablet Take 50 mg by mouth 2 (two) times daily.        . MULTIPLE VITAMIN PO Take by mouth daily.        . Omega-3 Fatty Acids (FISH OIL) 1000 MG CAPS Take by mouth.        . simvastatin (ZOCOR) 5 MG tablet Take 5 mg by mouth at bedtime.          Allergies  Allergen Reactions  . Amoxicillin (Amoxil)     Upset stomah  . Cephalexin     nausea  . Metformin And Related     Gi problems  . Norvasc (Amlodipine Besylate)     edema  . Phenergan (Promethazine Hcl)   . Procardia (Nifedipine)     dizziness    Patient Active Problem List  Diagnoses  . DIABETES MELLITUS II, UNCOMPLICATED  . HYPERLIPIDEMIA  .  HYPONATREMIA  . ANXIETY  . NEUROPATHY, PERIPHERAL  . HYPERTENSION, BENIGN SYSTEMIC  . HYPERTENSION  . EXTERNAL HEMORRHOIDS WITHOUT MENTION COMP  . VAGINITIS, ATROPHIC  . Diabetes mellitus  . Hyperkalemia  . IBS (irritable bowel syndrome)  . Hypercholesterolemia  . CERUMEN IMPACTION    History  Smoking status  . Never Smoker   Smokeless tobacco  . Not on file    History  Alcohol Use     History reviewed. No pertinent family history.  Review of Systems: Constitutional: no fever chills diaphoresis or fatigue or change in weight.  Head and neck: no hearing loss, no epistaxis, no photophobia or visual disturbance. Respiratory: No cough, shortness of breath or wheezing. Cardiovascular: No chest pain peripheral edema, palpitations. Gastrointestinal: No abdominal distention, no abdominal pain, no change in bowel habits hematochezia or melena. Genitourinary: No dysuria, no frequency, no urgency, no nocturia. Musculoskeletal:No arthralgias, no back pain, no gait disturbance or myalgias. Neurological: No dizziness, no headaches, no numbness, no seizures, no syncope, no weakness, no tremors. Hematologic: No lymphadenopathy, no easy bruising. Psychiatric: No confusion, no hallucinations, no sleep  disturbance.    Physical Exam: Weight 103, down 2 pounds.  Blood pressure 150/60.  Pulse 76 and regular.  The general appearance reveals a thin Caucasian woman in no acute distress.Pupils equal and reactive.   Extraocular Movements are full.  There is no scleral icterus.  The mouth and pharynx are normal.  The neck is supple.  The carotids reveal no bruits.  The jugular venous pressure is normal.  The thyroid is not enlarged.  There is no lymphadenopathy.The chest is clear to percussion and auscultation. There are no rales or rhonchi. Expansion of the chest is symmetrical.The precordium is quiet.  The first heart sound is normal.  The second heart sound is physiologically split.  There is no  murmur gallop rub or click.  There is no abnormal lift or heave.The abdomen is soft and nontender. Bowel sounds are normal. The liver and spleen are not enlarged. There Are no abdominal masses. There are no bruits.  Bowel sounds are loud.The pedal pulses are good.  There is no phlebitis or edema.  There is no cyanosis or clubbing.  She has a healing skin ulcer on the anterior surface of the right lower leg.  Dr. Caryl Never has been treating that.Strength is normal and symmetrical in all extremities.  There is no lateralizing weakness.  There are no sensory deficits. Assessment / Plan:  The patient is to continue her present medication.  She will continue careful diabetic diet. This has to be balanced against preventing further weight loss.Blood work today is pending.  She will be rechecked in 4 months for followup office visit and lab work.

## 2010-07-20 ENCOUNTER — Telehealth: Payer: Self-pay | Admitting: *Deleted

## 2010-07-20 NOTE — Telephone Encounter (Signed)
Message copied by Regis Bill on Fri Jul 20, 2010  2:01 PM ------      Message from: Cassell Clement      Created: Thu Jul 19, 2010  1:53 PM       Lab work better. CSD,

## 2010-07-22 ENCOUNTER — Other Ambulatory Visit: Payer: Self-pay | Admitting: Family Medicine

## 2010-07-23 ENCOUNTER — Ambulatory Visit: Payer: Self-pay | Admitting: Family Medicine

## 2010-07-24 NOTE — Telephone Encounter (Signed)
Advised patient of labs 

## 2010-08-31 ENCOUNTER — Telehealth: Payer: Self-pay | Admitting: Cardiology

## 2010-08-31 NOTE — Telephone Encounter (Signed)
Beta blockers such as Lopressor or metoprolol can sometimes cause nightmares.  She could try cutting down to just half the usual dose for a week or 2 and see if nightmares resolved

## 2010-08-31 NOTE — Telephone Encounter (Signed)
Wants to know if any of her medications could be causing nightmares.  She has had them in the past however she has been having them more frequently.  No new medication changes nor does she use anything at bedtime to help her sleep.  Please advise

## 2010-08-31 NOTE — Telephone Encounter (Signed)
ALL PT WOULD SAY IS HAS A QUESTION FOR MELINDA. CHART PLACED IN BOX.

## 2010-08-31 NOTE — Telephone Encounter (Signed)
Spoke with Dr. Patty Sermons after he sent below message and he realized she was taking metoprolol bid and he said to have her discontinue evening dose.  Advised patient to discontinue evening dose for 2 weeks and let us know how nightmares are doing

## 2010-09-07 ENCOUNTER — Encounter: Payer: Self-pay | Admitting: Cardiology

## 2010-09-11 ENCOUNTER — Other Ambulatory Visit: Payer: Self-pay | Admitting: *Deleted

## 2010-09-11 MED ORDER — AMLODIPINE BESYLATE 5 MG PO TABS: 5.0000 mg | ORAL_TABLET | Freq: Every day | ORAL | Status: DC

## 2010-09-11 NOTE — Telephone Encounter (Signed)
Refill done.  

## 2010-09-14 NOTE — Op Note (Signed)
   NAME:  SHEMECA, Meredith Rodriguez                               ACCOUNT NO.:  [id number]   MEDICAL RECORD NO.:  [id number]                   PATIENT TYPE:  AMB   LOCATION:  [facility]                                 FACILITY:  [facility]   PHYSICIAN:  Katherine Roan, M.D.               DATE OF BIRTH:  03-23-1926   DATE OF PROCEDURE:  DATE OF DISCHARGE:                                 OPERATIVE REPORT   PREOPERATIVE DIAGNOSIS:  Vaginal lesion, rule out adenocarcinoma of the  vagina.   POSTOPERATIVE DIAGNOSIS:  Probable atrophic vaginitis.   DESCRIPTION OF PROCEDURE:  The patient was placed in the lithotomy position  and prepped and draped in the usual fashion.  The left-sided lesion was  identified and excised under general anesthesia.  About 5 mL of 1% Xylocaine  was instilled into the vagina and the lesion was excised.  Hemostasis with  the Bovie and 3-0 Vicryl sutures were used for closure.  No unusual blood  loss occurred.  [name] tolerated the procedure well and was sent to  recovery in good condition.                                               Katherine Roan, M.D.    SDM/MEDQ  D:  12/30/2002  T:  12/30/2002  Job:  [id number]

## 2010-09-14 NOTE — Procedures (Signed)
[facility]  Patient:    Meredith Rodriguez, Meredith Rodriguez                            MRN: [id number] Proc. Date: 08/13/00 Adm. Date:  [id number] Attending:  Orland Mustard CC:         Dyanne Carrel, M.D.  Thomas A. Patty Sermons, M.D.   Procedure Report  PROCEDURE:  Colonoscopy with biopsy.  MEDICATIONS:  Fentanyl 75 mcg, Versed 6 mg IV.  SCOPE:  Olympus pediatric video colonoscope.  INDICATIONS FOR PROCEDURE:  Strong family history of colon cancer, sister died of colon cancer. She has had a change of bowel habits with severe diarrhea.  DESCRIPTION OF PROCEDURE:  The procedure had been explained to the patient and consent obtained. With the patient in the left lateral decubitus position, the Olympus pediatric video colonoscope was inserted and advanced under direct visualization. The prep was excellent. We were able to advance around to the cecum without a great deal of difficulty after placing the patient in various positions. The ileocecal valve and appendiceal orifice were seen. The scope was withdrawn and the cecum, ascending colon, hepatic flexure, transverse colon, splenic flexure, descending colon, and sigmoid colon were seen well. There were no polyps, no diverticular disease. Multiple random biopsies were taken throughout the colon to look for any evidence of microscopic colitis. The scope continued to be withdrawn. No other lesions were found. The patient tolerated the procedure quite well.  ASSESSMENT: 1. No evidence of polyps or neoplasia in a woman with a strong family history    of colon cancer. 2. No gross evidence of colitis.  PLAN:  Will check pathological results and will plan on seeing the patient back in the office in two months. Will plan on repeating this colonoscopy in five years. DD:  08/13/00 TD:  08/13/00 Job: [id number]

## 2010-09-14 NOTE — Discharge Summary (Signed)
NAMEELISABETH, Meredith Rodriguez                               ACCOUNT NO.:  [id number]   MEDICAL RECORD NO.:  [id number]                   PATIENT TYPE:  INP   LOCATION:  5524                                 FACILITY:  [facility]   PHYSICIAN:  Leighton Roach McDiarmid, M.D.             DATE OF BIRTH:  1925-05-20   DATE OF ADMISSION:  07/13/2003  DATE OF DISCHARGE:  07/21/2003                                 DISCHARGE SUMMARY   DISCHARGE DIAGNOSES:  1. Hyponatremia secondary to hypovolemia and syndrome of inappropriate     antidiuretic hormone secretion.  2. Rhabdomyolysis.  3. Seizures secondary to hyponatremia.  4. Type 2 diabetes.  5. Hypertension.  6. Hematuria.   DISCHARGE MEDICATIONS:  1. Toprol XL 100 mg p.o. daily.  2. Aspirin 81 mg p.o. daily.  3. Actos 30 mg p.o. daily.   FOLLOWUP:  The patient has appointments with:  1. Rebecca L. Jolinda Croak, M.D., on Wednesday, August 03, 2003, at 3 p.m.  2. On Friday, August 05, 2003, at 1:15 p.m. with Windy Fast L. Earlene Plater, M.D., of     urology.   SPECIAL INSTRUCTIONS:  1. The patient was instructed to drink no more than 1200 mL of water per     day.  2. She was also told to discuss with Dr. Jolinda Croak referral to the diabetes     nutrition center.  3. The patient was told to go to [facility] on Monday, July 25, 2003, to have blood drawn.   CONSULTS:  None.   PROCEDURES:  1. Head CT.  2. Renal ultrasound.   BRIEF ADMISSION HISTORY:  This is a 77 year old white female who presented  with altered mental status and questionable seizure activity.  The patient  had been acting differently per family.  On admission, the patient's blood  pressure was 178/88 with a heart rate of 82 and a respiratory rate of 22.  She was afebrile.   PHYSICAL EXAMINATION:  Physical exam was benign.   ADMISSION LABORATORIES:  Notable for a white count of 6.7, a hemoglobin of  12.8, a hematocrit of 37.4, and platelets of 207.  An INR of 1.  A CO2 of  24, a  glucose of 227, a BUN of 7, a creatinine of 0.8, a sodium of 106, an  AST of 114, an ALT of 40, an alkaline phosphatase of 32, a total bilirubin  of 1.2, a potassium of 2.6, and a chloride of 72.   HOSPITAL COURSE:  #1 - ALTERED MENTAL STATUS:  Was thought to be secondary  to the patient's hyponatremia.  During the hospitalization, it was  determined that the patient's hyponatremia was secondary to a combination of  things, hyponatremic hypovolemia, as well as syndrome of inappropriate  antidiuretic hormone production.  Initially during the hospital course for  her hyponatremia, the patient was placed on normal saline.  She was placed  on 70 mL/hr initially, which was calculated to not over correct sodium  within a 24-hour period.  The goal initially was to not remove more than 8  mEq in the first 24 hours that the patient was here to avoid central pontine  myelitis.  The patient was fully advanced on IV fluids up to a rate of 150  mL by hospital day #3.  The patient's sodium during the hospitalization went  no higher than 131.  After receiving Lasix 40 mg x 1, the patient's sodium  began to increase rapidly. At that time, it was still thought that the  patient's hyponatremia was solely secondary to hyponatremic hypovolemia  considering the patient drinks large amounts of fluid and does not eat.  She  has some obsessive-compulsive traits.  The patient has diabetes and  cholesterol and will not eat any foods that have sugar, sodium, or fat in  them and predominantly drinks water at home.  On looking at the patient's  osmolality, her initial urine osmolality was 438, which was within normal  limits.  After reviewing the patient's laboratories during the hospital  stay, it was determined that urine osmolalities did not fully fit the  picture of hypovolemia, so it was determined that the patient will be given  Lasix 20 mg and sodium chloride tablets.  On hospital days #7 and #8 with  that we  saw improvements in sodium and urine osmolality looked more  appropriate.  The last urine osmolality was 646 on July 18, 2003, prior to  the first dose of Lasix.  So on hospital days #7 and #8, the patient  received 40 mg of Lasix and sodium chloride tablet 1 g b.i.d.  this was to  get her sodium to 131.  It was determined at that point that the patient's  sodium would not go higher than that as it did not go higher than that with  the normal saline early in hospitalization.  It was determined that the  patient's normal values are probably reset.  Now that she has completed, it  was determined to see what her body would do on its own.  There was no  etiology for the SIADH.  The patient had chest x-ray which did not reveal  anything.  CT of the head was negative.  No source of infection is there.  The patient was afebrile during hospitalization.  The patient was taking  hydrochlorothiazide at the time prior to hospitalization.  That was  discontinued.  We also held her ARB.  The patient was maintained on Actos  and metoprolol during the hospitalization.  Those were the only medications  that were given and do not interfere with her electrolyte status.  The  patient was set up for laboratory checks the Monday after discharge to check  her sodium level.  At that time, the outpatient physician will determine  whether the patient needs to take sodium chloride tablets on a regular basis  or if the patient is stable without any adjuvant medications.   #2 - RHABDOMYOLYSIS:  Secondary to what was believed to be a seizure that  occurred at home prior to hospitalization.  The patient's CK went up to  22,000 during hospitalization.  The last value that was checked before  discharge was on July 18, 2003, and was 2507 at that time.  We felt that it  was an appropriate decline and did not check any further laboratories.  The patient's creatinine  remained stable during her hospitalization, ranging   anywhere from 0.6-0.8.  The seizure was felt to be secondary to the  patient's electrolyte imbalance.   #3 - DIABETES:  The patient was well controlled on the Actos during the  hospitalization.  Actos was held for a number of days during the  hospitalization to ensure that it was not effecting her electrolyte balance.  The patient was restarted on the Actos at discharge.  She was maintained  otherwise during the hospitalization on sensitive sliding scale insulin of  regular.  Blood sugars during the hospitalization range anywhere from 90-  130.  Her hemoglobin A1C during the hospitalization was 6.8.  The patient  received a nutrition consult for her lack of eating during hospitalization.  It was discussed that she would be set up with the diabetes nutrition center  as an outpatient.   #4 - HYPERCHOLESTEROLEMIA:  The patient came in on Zocor.  Considering her  rhabdomyolysis at the time, we held the Zocor.  We got a fasting lipid panel  during the hospitalization.  The cholesterol was 163, triglycerides 61, HDL  46, and LDL 105.   #5 - HEMATURIA:  The patient had been worked up on an outpatient basis for  persistent hematuria.  On admission, the patient's UA did show 7-10 red  blood cells per field.  A repeat urinalysis during the hospitalization was  negative for blood.  The patient did have a renal ultrasound done  considering the patient had been scheduled for a cystogram with her  urologist during the time of her hospitalization and missed that  appointment.  Renal ultrasound was negative and full report was faxed to Dr.  Earlene Plater' office at the urology center.  The patient was set up with a followup  appointment with him at discharge.   OTHER LABORATORIES DURING HOSPITALIZATION:  The patient's TSH was 2.05.  The  patient had a cortisol level of 35.4 and that was drawn at 5:30 a.m.  The  a.m. range for normal is 4.2-22.4.  The patient did have a set of cardiac  enzymes to rule out  ischemic event as the inciting event for electrolyte  abnormality and she had an initial troponin of 0.1.  A repeat troponin was  0.08.  This was felt to be secondary to her rhabdomyolysis.  She had no EKG  changes.  No further workup was done.  The patient had blood cultures done  which were no growth x 5 days at two different sites.      Anastasio Auerbach, MD                          Leighton Roach McDiarmid, M.D.    AD/MEDQ  D:  07/21/2003  T:  07/23/2003  Job:  [id number]   cc:   Cassell Clement, M.D.  [address], [address]  [place]  [place] [address]  Fax: [phone]   Randon Goldsmith. Jolinda Croak, M.D.  Fax: [phone]

## 2010-09-14 NOTE — H&P (Signed)
NAMETY, OSHIMA NO.:  [id number]   MEDICAL RECORD NO.:  [id number]                   PATIENT TYPE:  INP   LOCATION:  2005                                 FACILITY:  [facility]   PHYSICIAN:  Marlan Palau, M.D.               DATE OF BIRTH:  August 27, 1925   DATE OF ADMISSION:  07/13/2003  DATE OF DISCHARGE:                                HISTORY & PHYSICAL   HISTORY OF PRESENT ILLNESS:  Meredith Rodriguez is a 77 year old right-handed white  female born 12/13/1925 with a history of diabetes, hypertension, and  hypercholesterolemia.  This patient was brought to [facility] Room  for evaluation of what appeared to be a fairly sudden onset of mental status  change around 11:45 p.m. on the 15th of March.  The patient had been feeling  nauseated earlier.  She was given some promethazine to take.  The patient  was able to eat and drink a little bit this evening.  The patient apparently  woke her husband by making an unusual noise.  The patient seemed to twitch  or jerk a little bit and would not speak or respond after that point.  The  patient seemed to be able to move all four extremities and was mildly  agitated.  She was brought to the emergency room for an evaluation.   CT scan of the brain shows evidence of a right subinsular cortex infarct and  a left deep frontal white matter infarct of indeterminate age.  The patient  is able to whisper brief phrases, claiming she does not know her name.  The  patient, otherwise, is bright alert, but again remains slightly agitated.  Neurology was called for further evaluation.   No prior history of hypoglycemic episodes.  The patient apparently has had a  history of cerebrovascular disease and had episodes of slurred speech,  drooling from the mouth in 2000.   PAST MEDICAL HISTORY:  1. New onset of speech disturbance, altered mental status, possible stroke     event.  2. Hypertension.  3. Diabetes.  4.  History of prior cerebrovascular disease.  5. Varicose vein surgery in the past.  6. Diabetic peripheral neuropathy.  7. Hypercholesterolemia.  8. The patient has had some recent workup for hematuria.   MEDICATIONS:  1. Toprol XL 100 mg daily.  2. Hyzaar 100/25 mg daily.  3. Zocor 20 mg a day.  4. Aspirin 81 mg a day.  5. Actos 30 mg a day.  6. Promethazine p.r.n.   ALLERGIES:  No known drug allergies.   She does not smoke or drink.   SOCIAL HISTORY:  The patient is married and lives in the [place] area.  She has five children, all alive and well.  The patient is not currently  working.   FAMILY HISTORY:  Mother died with stroke, heart disease.  Father died from  an MI, had a history of diabetes.  The patient has had seven sisters and two  brothers.  Two brothers died with an MI.  A sister also had an MI and  another sister with stroke and another with renal failure.   REVIEW OF SYMPTOMS:  Cannot be directly obtained.  The patient will not  verbalize significantly.   PHYSICAL EXAMINATION:  VITAL SIGNS:  Blood pressure is currently 178/88;  heart rate 82; respiratory rate 22; temperature afebrile.  GENERAL:  The patient is a fairly well-developed white female who is alert,  will not verbalize significantly at the time of the examination.  HEENT:  Head is atraumatic.  Pupils are round and reactive to light.  Discs  are flat bilaterally.  NECK:  Supple.  No carotid bruits noted.  RESPIRATORY:  Clear.  CARDIOVASCULAR:  Regular rate and rhythm.  No obvious murmurs or rubs noted.  ABDOMEN:  Positive bowel sounds, no organomegaly or tenderness noted.  EXTREMITIES:  Without significant edema.  NEUROLOGIC:  Cranial nerves as above.  Facial asymmetry is present.  The  patient the ability to blink to threat bilaterally.  The patient will state  that she does not know her name.  She has ability to state whether she feels  pinprick sensation or not.  Otherwise, the patient will not  verbalize at  home.  Motor testing reveals that the patient moves all fours fairly  symmetrically.  She will withdraw from pain in all fours.  The patient,  otherwise, does not follow commands well.  She will not perform cerebellar  testing.  Deep tendon reflexes remain symmetric.  Toes are apparently  upgoing bilaterally.  The patient was not ambulating.   LABORATORY VALUES:  Notable for white count of 6.7, hemoglobin 12.8,  hematocrit 37.4, MCV 93.2, platelets 207.  INR 1, CO2 24, glucose 227, BUN  7, creatinine 0.8, calcium 8.1, total protein 6.4, albumin 3.5, AST 114,  ALT40, alkaline phosphatase 32, total bilirubin 1.2.   CT of the head is as above.  Chest x-ray and EKG are pending.   IMPRESSION:  1. Altered mental status, rule out cerebrovascular infarction.  2. Diabetes.  3. Hypertension.  4. Hypercholesterolemia.   This patient has had onset of problems with altered mental status.  CT of  the head does show evidence of cerebrovascular disease.  Recent lab data  reveals a sodium of 106, potassium 2.6, chloride of 72.  Electrolyte  imbalance is the likely cause of her mental status changes.  The patient may  have suffered a seizure event tonight.  She will need treatment for the  above electrolyte disturbance.  We will obtain an EG study in the a.m.                                                Marlan Palau, M.D.    CKW/MEDQ  D:  07/13/2003  T:  07/15/2003  Job:  [id number]   cc:   [facility]  [address]  [address]   Cassell Clement, M.D.  [address], [address]  [place]  [place] [address]  Fax: [phone]   Bryan Lemma. Manus Gunning, M.D.  [address] [place]  [place] [address]  Fax: [phone]

## 2010-09-26 ENCOUNTER — Ambulatory Visit (INDEPENDENT_AMBULATORY_CARE_PROVIDER_SITE_OTHER): Payer: Medicare Other | Admitting: Family Medicine

## 2010-09-26 ENCOUNTER — Encounter: Payer: Self-pay | Admitting: Family Medicine

## 2010-09-26 DIAGNOSIS — E119 Type 2 diabetes mellitus without complications: Secondary | ICD-10-CM

## 2010-09-26 DIAGNOSIS — M25519 Pain in unspecified shoulder: Secondary | ICD-10-CM

## 2010-09-26 DIAGNOSIS — R634 Abnormal weight loss: Secondary | ICD-10-CM

## 2010-09-26 LAB — CBC WITH DIFFERENTIAL/PLATELET
Basophils Relative: 0.1 % (ref 0.0–3.0)
Eosinophils Relative: 2.5 % (ref 0.0–5.0)
Hemoglobin: 11.2 g/dL — ABNORMAL LOW (ref 12.0–15.0)
Lymphocytes Relative: 21 % (ref 12.0–46.0)
Monocytes Relative: 0.6 % — ABNORMAL LOW (ref 3.0–12.0)
Neutro Abs: 5.8 10*3/uL (ref 1.4–7.7)
Neutrophils Relative %: 75.8 % (ref 43.0–77.0)
RBC: 3.04 Mil/uL — ABNORMAL LOW (ref 3.87–5.11)
WBC: 7.7 10*3/uL (ref 4.5–10.5)

## 2010-09-26 LAB — BASIC METABOLIC PANEL
CO2: 30 mEq/L (ref 19–32)
Calcium: 10.2 mg/dL (ref 8.4–10.5)
GFR: 79.68 mL/min (ref >60.00)
Sodium: 136 mEq/L (ref 135–145)

## 2010-09-26 LAB — GLUCOSE, POCT (MANUAL RESULT ENTRY): POC Glucose: 127

## 2010-09-26 LAB — HEPATIC FUNCTION PANEL
Albumin: 4.6 g/dL (ref 3.5–5.2)
Alkaline Phosphatase: 20 U/L — ABNORMAL LOW (ref 39–117)
Bilirubin, Direct: 0.1 mg/dL (ref 0.0–0.3)
Total Bilirubin: 1 mg/dL (ref 0.3–1.2)

## 2010-09-26 LAB — TSH: TSH: 2.31 u[IU]/mL (ref 0.35–5.50)

## 2010-09-26 MED ORDER — TRAMADOL HCL 50 MG PO TABS: 50.0000 mg | ORAL_TABLET | Freq: Four times a day (QID) | ORAL | Status: AC | PRN

## 2010-09-26 NOTE — Progress Notes (Signed)
Subjective:    Patient ID: Meredith Rodriguez, female    DOB: 1925-05-27, 77 y.o.   MRN: [id number]  HPI Patient seen with weight loss. She has chronic problems of hyperlipidemia, history of peripheral neuropathy, hypertension, type 2 diabetes. Blood sugars recently and occasionally as high as 250 2 hours postprandial. Fasting today 127. Occasional thirst. Minimal urine frequency. Takes no medications for diabetes. Strong family history diabetes. Does have some decrease in appetite. Denies any abdominal pain, headache, diarrhea.  Colonoscopy March 2012. Mammogram December 2011 normal. She denies any fever, chills, night sweats. No cough. Nonsmoker.  She thinks her weight loss may be secondary to some chronic pain issues. Recently saw orthopedist and reportedly had steroid injection left shoulder. Still has considerable pain. No progressive weakness. Tried aspirin without relief. Very reluctant to take medications.  Patient denies any depressive symptoms  Past Medical History  Diagnosis Date  . Hypertension   . Diabetes mellitus   . Hyperkalemia   . IBS (irritable bowel syndrome)   . Hypercholesterolemia    Past Surgical History  Procedure Date  . Varicose veins     surgey 1959  . Tonsilectomy, adenoidectomy, bilateral myringotomy and tubes     reports that she has never smoked. She does not have any smokeless tobacco history on file. Her alcohol and drug histories not on file. family history is not on file. Allergies  Allergen Reactions  . Amoxicillin (Amoxil)     Upset stomah  . Cephalexin     nausea  . Metformin And Related     Gi problems  . Norvasc (Amlodipine Besylate)     edema  . Phenergan (Promethazine Hcl)   . Procardia (Nifedipine)     dizziness      Review of Systems  Constitutional: Positive for appetite change, fatigue and unexpected weight change. Negative for fever, chills and activity change.  HENT: Negative for ear pain, sore throat and trouble swallowing.     Eyes: Negative for visual disturbance.  Respiratory: Negative for cough, shortness of breath and wheezing.   Cardiovascular: Negative for chest pain, palpitations and leg swelling.  Gastrointestinal: Negative for abdominal pain, blood in stool and abdominal distention.  Genitourinary: Negative for dysuria and hematuria.  Skin: Negative for rash.  Neurological: Negative for dizziness and headaches.  Hematological: Negative for adenopathy. Does not bruise/bleed easily.       Objective:   Physical Exam  Constitutional: She is oriented to person, place, and time. She appears well-developed.       Patient is thin, alert cooperative in no distress  HENT:  Head: Normocephalic and atraumatic.  Right Ear: External ear normal.  Left Ear: External ear normal.  Mouth/Throat: Oropharynx is clear and moist. No oropharyngeal exudate.  Eyes: Pupils are equal, round, and reactive to light.  Neck: Neck supple. No thyromegaly present.  Cardiovascular: Normal rate and regular rhythm.   Pulmonary/Chest: Effort normal and breath sounds normal. No respiratory distress. She has no wheezes. She has no rales.  Abdominal: Soft. Bowel sounds are normal. She exhibits no distension and no mass. There is no tenderness. There is no guarding.       No hepatomegaly or splenomegaly  Musculoskeletal: She exhibits no edema.  Lymphadenopathy:    She has no cervical adenopathy.  Neurological: She is alert and oriented to person, place, and time.  Skin: No rash noted.  Psychiatric: She has a normal mood and affect. Her behavior is normal.  Assessment & Plan:  #1 weight loss. Questionable etiology. Question moist related to his recent pain. Pain further labs with CBC, TSH, hepatic panel, and basic metabolic panel.  Followup scheduled in one month.  Dietary suggestions given for increasing caloric intake without excessive carbohydrates #2 type 2 diabetes. History of good control. Repeat A1c #3 chronic  shoulder pain. Trial of tramadol 50 mg one to 2 every 6 hours as needed.  She is encouraged to follow up with orthopedist

## 2010-09-27 NOTE — Progress Notes (Signed)
Quick Note:  LMTCB on only home phone ______

## 2010-09-27 NOTE — Progress Notes (Signed)
Quick Note:  Pt informed and she is scheduling repeat labs for tomorrow now ______

## 2010-09-28 ENCOUNTER — Other Ambulatory Visit (INDEPENDENT_AMBULATORY_CARE_PROVIDER_SITE_OTHER): Payer: Medicare Other

## 2010-09-28 DIAGNOSIS — D518 Other vitamin B12 deficiency anemias: Secondary | ICD-10-CM

## 2010-09-28 DIAGNOSIS — I10 Essential (primary) hypertension: Secondary | ICD-10-CM

## 2010-09-28 DIAGNOSIS — D519 Vitamin B12 deficiency anemia, unspecified: Secondary | ICD-10-CM

## 2010-09-28 LAB — BASIC METABOLIC PANEL
BUN: 19 mg/dL (ref 6–23)
Chloride: 101 mEq/L (ref 96–112)
Glucose, Bld: 152 mg/dL — ABNORMAL HIGH (ref 70–99)
Potassium: 5 mEq/L (ref 3.5–5.1)

## 2010-09-28 LAB — VITAMIN B12: Vitamin B-12: >1500 pg/mL — ABNORMAL HIGH (ref 211–911)

## 2010-09-28 NOTE — Progress Notes (Signed)
Quick Note:  Pt informed ______ 

## 2010-10-11 ENCOUNTER — Telehealth: Payer: Self-pay | Admitting: Family Medicine

## 2010-10-11 NOTE — Telephone Encounter (Signed)
[facility] called req status of surgical clearance form that was faxed to Dr Caryl Never yesterday a.m. Pt is sch to have surgery on spine 10/17/10. Need this info faxed faxed back asap or a doctors order on script pad that says surgery is ok. Fax# [phone]

## 2010-10-11 NOTE — Telephone Encounter (Signed)
Do you have this form already?

## 2010-10-15 ENCOUNTER — Encounter (HOSPITAL_COMMUNITY)
Admission: RE | Admit: 2010-10-15 | Discharge: 2010-10-15 | Disposition: A | Discharge status: Home / Self Care | Payer: Medicare Other | Source: Ambulatory Visit | Attending: Orthopedic Surgery | Admitting: Orthopedic Surgery

## 2010-10-15 ENCOUNTER — Other Ambulatory Visit (HOSPITAL_COMMUNITY): Payer: Self-pay | Admitting: Orthopedic Surgery

## 2010-10-15 ENCOUNTER — Other Ambulatory Visit: Payer: Self-pay | Admitting: *Deleted

## 2010-10-15 ENCOUNTER — Encounter: Payer: Self-pay | Admitting: *Deleted

## 2010-10-15 ENCOUNTER — Telehealth: Payer: Self-pay | Admitting: Cardiology

## 2010-10-15 ENCOUNTER — Ambulatory Visit (HOSPITAL_COMMUNITY)
Admission: RE | Admit: 2010-10-15 | Discharge: 2010-10-15 | Disposition: A | Discharge status: Home / Self Care | Payer: Medicare Other | Source: Ambulatory Visit | Attending: Orthopedic Surgery | Admitting: Orthopedic Surgery

## 2010-10-15 DIAGNOSIS — M5020 Other cervical disc displacement, unspecified cervical region: Secondary | ICD-10-CM

## 2010-10-15 DIAGNOSIS — Z01818 Encounter for other preprocedural examination: Secondary | ICD-10-CM | POA: Insufficient documentation

## 2010-10-15 DIAGNOSIS — J449 Chronic obstructive pulmonary disease, unspecified: Secondary | ICD-10-CM | POA: Insufficient documentation

## 2010-10-15 DIAGNOSIS — Z01812 Encounter for preprocedural laboratory examination: Secondary | ICD-10-CM | POA: Insufficient documentation

## 2010-10-15 DIAGNOSIS — E7800 Pure hypercholesterolemia, unspecified: Secondary | ICD-10-CM

## 2010-10-15 DIAGNOSIS — I10 Essential (primary) hypertension: Secondary | ICD-10-CM

## 2010-10-15 DIAGNOSIS — I700 Atherosclerosis of aorta: Secondary | ICD-10-CM | POA: Insufficient documentation

## 2010-10-15 DIAGNOSIS — Z01810 Encounter for preprocedural cardiovascular examination: Secondary | ICD-10-CM | POA: Insufficient documentation

## 2010-10-15 DIAGNOSIS — J4489 Other specified chronic obstructive pulmonary disease: Secondary | ICD-10-CM | POA: Insufficient documentation

## 2010-10-15 DIAGNOSIS — E119 Type 2 diabetes mellitus without complications: Secondary | ICD-10-CM

## 2010-10-15 LAB — COMPREHENSIVE METABOLIC PANEL
Albumin: 4.4 g/dL (ref 3.5–5.2)
BUN: 17 mg/dL (ref 6–23)
Chloride: 101 mEq/L (ref 96–112)
Creatinine, Ser: 0.58 mg/dL (ref 0.50–1.10)
Total Bilirubin: 0.6 mg/dL (ref 0.3–1.2)

## 2010-10-15 LAB — DIFFERENTIAL
Basophils Absolute: 0 10*3/uL (ref 0.0–0.1)
Eosinophils Absolute: 0.2 10*3/uL (ref 0.0–0.7)
Eosinophils Relative: 4 % (ref 0–5)

## 2010-10-15 LAB — CBC
Platelets: 532 10*3/uL — ABNORMAL HIGH (ref 150–400)
RDW: 16.1 % — ABNORMAL HIGH (ref 11.5–15.5)
WBC: 6 10*3/uL (ref 4.0–10.5)

## 2010-10-15 LAB — TYPE AND SCREEN
ABO/RH(D): AB NEG
Antibody Screen: NEGATIVE

## 2010-10-15 LAB — URINALYSIS, ROUTINE W REFLEX MICROSCOPIC
Bilirubin Urine: NEGATIVE
Ketones, ur: NEGATIVE mg/dL
Nitrite: NEGATIVE
Urobilinogen, UA: 0.2 mg/dL (ref 0.0–1.0)

## 2010-10-15 LAB — APTT: aPTT: 33 seconds (ref 24–37)

## 2010-10-15 LAB — PROTIME-INR
INR: 1.02 (ref 0.00–1.49)
Prothrombin Time: 13.6 seconds (ref 11.6–15.2)

## 2010-10-15 LAB — SURGICAL PCR SCREEN: Staphylococcus aureus: NEGATIVE

## 2010-10-15 NOTE — Progress Notes (Signed)
Needs lexiscan for surgical clearance with [facility]

## 2010-10-15 NOTE — Telephone Encounter (Signed)
Fax: OV

## 2010-10-15 NOTE — Telephone Encounter (Signed)
I have not seen these forms.

## 2010-10-17 ENCOUNTER — Ambulatory Visit (HOSPITAL_COMMUNITY): Admission: RE | Admit: 2010-10-17 | Payer: Medicare Other | Source: Ambulatory Visit | Admitting: Orthopedic Surgery

## 2010-10-23 ENCOUNTER — Ambulatory Visit (HOSPITAL_COMMUNITY): Payer: Medicare Other | Attending: Cardiology | Admitting: Radiology

## 2010-10-23 DIAGNOSIS — E119 Type 2 diabetes mellitus without complications: Secondary | ICD-10-CM | POA: Insufficient documentation

## 2010-10-23 DIAGNOSIS — R0602 Shortness of breath: Secondary | ICD-10-CM

## 2010-10-23 DIAGNOSIS — E7800 Pure hypercholesterolemia, unspecified: Secondary | ICD-10-CM | POA: Insufficient documentation

## 2010-10-23 DIAGNOSIS — Z01818 Encounter for other preprocedural examination: Secondary | ICD-10-CM

## 2010-10-23 DIAGNOSIS — I10 Essential (primary) hypertension: Secondary | ICD-10-CM | POA: Insufficient documentation

## 2010-10-23 DIAGNOSIS — Z01810 Encounter for preprocedural cardiovascular examination: Secondary | ICD-10-CM

## 2010-10-23 MED ORDER — TECHNETIUM TC 99M TETROFOSMIN IV KIT
33.0000 | PACK | Freq: Once | INTRAVENOUS | Status: AC | PRN
  Administered 2010-10-23: 33 via INTRAVENOUS

## 2010-10-23 MED ORDER — REGADENOSON 0.4 MG/5ML IV SOLN
0.4000 mg | Freq: Once | INTRAVENOUS | Status: AC
  Administered 2010-10-23: 0.4 mg via INTRAVENOUS

## 2010-10-23 MED ORDER — TECHNETIUM TC 99M TETROFOSMIN IV KIT
11.0000 | PACK | Freq: Once | INTRAVENOUS | Status: AC | PRN
  Administered 2010-10-23: 11 via INTRAVENOUS

## 2010-10-23 NOTE — Progress Notes (Signed)
[facility] SITE 3 NUCLEAR MED [address] [place] [address] [phone]  Cardiology Nuclear Med Study  Meredith Rodriguez is a 77 y.o. female [id number] 1925/11/11   Nuclear Med Background Indication for Stress Test:  Evaluation for Ischemia, Abnormal EKG with Preop screening and Pending Surgical Clearance for neck surgery with Dr. Yevette Edwards History: 04 Myocardial Perfusion Study:EF=82% and normal Cardiac Risk Factors:History of CVA, Family History - CAD, Hypertension, Lipids and NIDDM  Symptoms:  Fatigue, Palpitations, Rapid HR and SOB   Nuclear Pre-Procedure Caffeine/Decaff Intake:  None NPO After: 8:00pm   Lungs:  clear IV 0.9% NS with Angio Cath:  20g  IV Site: R Antecubital  IV Started by:  Stanton Kidney, EMT-P  Chest Size (in):  34 Cup Size: B  Height: 5\' 5"  (1.651 m)  Weight:  102 lb (46.267 kg)  BMI:  Body mass index is 16.97 kg/(m^2). Tech Comments:  Metoprolol held 12 hrs per patient    Nuclear Med Study 1 or 2 day study: 1 day  Stress Test Type:  Treadmill/Lexiscan  Reading MD: Kristeen Miss, MD  Order Authorizing Provider:  Villa Herb  Resting Radionuclide: Technetium 37m Tetrofosmin  Resting Radionuclide Dose: 11.0 mCi   Stress Radionuclide:  Technetium 91m Tetrofosmin  Stress Radionuclide Dose: 33.0 mCi           Stress Protocol Rest HR: 73 Stress HR: 114  Rest BP: 155/65 Stress BP: 212/76  Exercise Time (min): 2:00 METS: 1.7   Predicted Max HR: 138 bpm % Max HR: 82.61 bpm Rate Pressure Product: 46962   Dose of Adenosine (mg):  n/a Dose of Lexiscan: 0.4 mg  Dose of Atropine (mg): n/a Dose of Dobutamine: n/a mcg/kg/min (at max HR)  Stress Test Technologist: Cathlyn Parsons, RN  Nuclear Technologist:  Domenic Polite, CNMT     Rest Procedure:  Myocardial perfusion imaging was performed at rest 45 minutes following the intravenous administration of Technetium 66m Tetrofosmin. Rest ECG: NSR  Stress Procedure:  The patient  received IV Lexiscan 0.4 mg over 15-seconds with concurrent low level exercise and then Technetium 71m Tetrofosmin was injected at 30-seconds while the patient continued walking one more minute.  There were no significant changes with Lexiscan. Patient had occasional PAC's.  Quantitative spect images were obtained after a 45-minute delay. Stress ECG: Insignificant upsloping ST segment depression.  QPS Raw Data Images:  Normal; no motion artifact; normal heart/lung ratio. Stress Images:  Normal homogeneous uptake in all areas of the myocardium. Rest Images:  Normal homogeneous uptake in all areas of the myocardium. Subtraction (SDS):  No evidence of ischemia. Transient Ischemic Dilatation (Normal <1.22):  1.07 Lung/Heart Ratio (Normal <0.45):  0.27  Quantitative Gated Spect Images QGS EDV:  60 ml QGS ESV:  15 ml QGS cine images:  NL LV Function; NL Wall Motion QGS EF: 74%  Impression Exercise Capacity:  Lexiscan with low level exercise. BP Response:  Hypertensive blood pressure response. Clinical Symptoms:  No chest pain. ECG Impression:  Insignificant upsloping ST segment depression. Comparison with Prior Nuclear Study: No previous nuclear study performed  Overall Impression:  Normal stress nuclear study.  No evidence of ischemia.  Normal LV function.    Elyn Aquas., MD, [facility]

## 2010-10-24 ENCOUNTER — Ambulatory Visit: Payer: Medicare Other | Admitting: Family Medicine

## 2010-10-24 NOTE — Progress Notes (Signed)
Advised and sent surgical clearance

## 2010-10-24 NOTE — Progress Notes (Signed)
Please report.  The stress test is normal.  No evidence of ischemia.  The patient is cleared for surgery.   Cassell Clement

## 2010-10-24 NOTE — Progress Notes (Signed)
Nuc med study routed to Dr. Patty Sermons 10/24/10 Domenic Polite

## 2010-10-26 ENCOUNTER — Encounter: Payer: Self-pay | Admitting: Cardiology

## 2010-10-29 ENCOUNTER — Encounter: Payer: Self-pay | Admitting: Cardiology

## 2010-11-07 ENCOUNTER — Encounter: Payer: Self-pay | Admitting: Cardiology

## 2010-11-07 ENCOUNTER — Encounter: Payer: Self-pay | Admitting: Cardiovascular Disease

## 2010-12-13 ENCOUNTER — Ambulatory Visit (INDEPENDENT_AMBULATORY_CARE_PROVIDER_SITE_OTHER): Payer: Medicare Other | Admitting: Cardiology

## 2010-12-13 ENCOUNTER — Encounter: Payer: Self-pay | Admitting: Cardiology

## 2010-12-13 VITALS — BP 140/70 | HR 66 | Wt 104.0 lb

## 2010-12-13 DIAGNOSIS — E785 Hyperlipidemia, unspecified: Secondary | ICD-10-CM

## 2010-12-13 DIAGNOSIS — E119 Type 2 diabetes mellitus without complications: Secondary | ICD-10-CM

## 2010-12-13 DIAGNOSIS — E7800 Pure hypercholesterolemia, unspecified: Secondary | ICD-10-CM

## 2010-12-13 DIAGNOSIS — K589 Irritable bowel syndrome without diarrhea: Secondary | ICD-10-CM

## 2010-12-13 NOTE — Assessment & Plan Note (Signed)
Patient denies any hypoglycemic reactions.  Her weight is up 1 pound since last visit.  Her appetite is fair

## 2010-12-13 NOTE — Progress Notes (Signed)
Meredith Rodriguez Date of Birth:  1925-09-21 [facility] Cardiology / [facility] [address]   [address] [place], [place]  [address] [phone]           Fax   [phone]  History of Present Illness: This pleasant 77 year old woman is seen for a scheduled followup office visit.  She has a history of essential hypertension and history of diabetes mellitus.  She has an intolerance to ACE inhibitors because of hyperkalemia.  She does not have any history of ischemic heart disease.  She had a normal stress Cardiolite on 07/20/02.  She has had a problem with pain in her left arm and the left side of her neck.  She has been told that she has a pinched nerve and that she will need neurosurgery to relieve her symptoms.  In preparation for preop evaluation she underwent a nuclear stress test on 618 12 which was negative for ischemia.  However, by the time she did her stress test her other preoperative blood work had expired and so she had not bothered to contact her surgeon about rescheduling her for surgery.  Her symptoms are about the same as they have been and has not improved on their own.  The patient has not been experiencing any increased shortness of breath.  She's not having any hypoglycemic reactions from her diabetes.  She denies any chest pain or angina.  Current Outpatient Prescriptions  Medication Sig Dispense Refill  . amLODipine (NORVASC) 5 MG tablet Take 1 tablet (5 mg total) by mouth daily.  90 tablet  3  . Ascorbic Acid (VITAMIN C) 500 MG tablet Take 500 mg by mouth daily.        Marland Kitchen aspirin 81 MG tablet Take 81 mg by mouth daily.        . metoprolol (LOPRESSOR) 50 MG tablet Take 50 mg by mouth 2 (two) times daily.        . MULTIPLE VITAMIN PO Take by mouth daily.        . Omega-3 Fatty Acids (FISH OIL) 1000 MG CAPS Take by mouth.        Marland Kitchen PREMARIN vaginal cream       . simvastatin (ZOCOR) 5 MG tablet Take 5 mg by mouth at bedtime.        . traMADol (ULTRAM) 50 MG tablet As needed          Allergies  Allergen Reactions  . Amoxicillin (Amoxil)     Upset stomah  . Cephalexin     nausea  . Metformin And Related     Gi problems  . Norvasc (Amlodipine Besylate)     edema  . Phenergan (Promethazine Hcl)   . Procardia (Nifedipine)     dizziness    Patient Active Problem List  Diagnoses  . DIABETES MELLITUS II, UNCOMPLICATED  . HYPERLIPIDEMIA  . HYPONATREMIA  . ANXIETY  . NEUROPATHY, PERIPHERAL  . HYPERTENSION, BENIGN SYSTEMIC  . HYPERTENSION  . EXTERNAL HEMORRHOIDS WITHOUT MENTION COMP  . VAGINITIS, ATROPHIC  . Diabetes mellitus  . Hyperkalemia  . IBS (irritable bowel syndrome)  . Hypercholesterolemia  . CERUMEN IMPACTION    History  Smoking status  . Never Smoker   Smokeless tobacco  . Not on file    History  Alcohol Use     No family history on file.  Review of Systems: Constitutional: no fever chills diaphoresis or fatigue or change in weight.  Head and neck: no hearing loss, no epistaxis, no  photophobia or visual disturbance. Respiratory: No cough, shortness of breath or wheezing. Cardiovascular: No chest pain peripheral edema, palpitations. Gastrointestinal: No abdominal distention, no abdominal pain, no change in bowel habits hematochezia or melena. Genitourinary: No dysuria, no frequency, no urgency, no nocturia. Musculoskeletal:No arthralgias, no back pain, no gait disturbance or myalgias. Neurological: No dizziness, no headaches, no numbness, no seizures, no syncope, no weakness, no tremors. Hematologic: No lymphadenopathy, no easy bruising. Psychiatric: No confusion, no hallucinations, no sleep disturbance.    Physical Exam: Filed Vitals:   12/13/10 0937  BP: 140/70  Pulse: 66  The general appearance reveals a thin elderly woman in no acute distress.Pupils equal and reactive.   Extraocular Movements are full.  There is no scleral icterus.  The mouth and pharynx are normal.  The neck is supple.  The carotids reveal no bruits.   The jugular venous pressure is normal.  The thyroid is not enlarged.  There is no lymphadenopathy.  The chest is clear to percussion and auscultation. There are no rales or rhonchi. Expansion of the chest is symmetrical.  The precordium is quiet.  The first heart sound is normal.  The second heart sound is physiologically split.  There is no murmur gallop rub or click.  There is no abnormal lift or heave.  The abdomen is soft and nontender. Bowel sounds are normal. The liver and spleen are not enlarged. There Are no abdominal masses. There are no bruits.    Normal extremity without phlebitis or edema.  Pedal pulses are present.The skin is warm and dry.  There is no rash.  Strength is normal and symmetrical in all extremities.  There is no lateralizing weakness.  There are no sensory deficits.     Assessment / Plan: I reviewed her recent stress Myoview with her.  It showed no ischemia.  From a cardiac standpoint she is a satisfactory risk for surgery on her neck.  I told her that I would send a copy of this note to her surgeon Dr. Yevette Edwards  who may wish to reschedule the surgery Recheck here in 4 months for office visit and fasting lab work.

## 2010-12-13 NOTE — Assessment & Plan Note (Signed)
The patient has a history of hypercholesterolemia.  She has not done well in general with statins at the present time she is tolerating simvastatin 5 mg once a day.

## 2010-12-13 NOTE — Assessment & Plan Note (Signed)
The patient has a past history of irritable bowel syndrome.  She states that those symptoms are actually improved over the past several months.

## 2010-12-26 ENCOUNTER — Ambulatory Visit: Payer: MEDICARE | Admitting: Family Medicine

## 2010-12-27 ENCOUNTER — Ambulatory Visit (INDEPENDENT_AMBULATORY_CARE_PROVIDER_SITE_OTHER): Payer: Medicare Other | Admitting: Family Medicine

## 2010-12-27 ENCOUNTER — Encounter: Payer: Self-pay | Admitting: Family Medicine

## 2010-12-27 DIAGNOSIS — L309 Dermatitis, unspecified: Secondary | ICD-10-CM

## 2010-12-27 DIAGNOSIS — L259 Unspecified contact dermatitis, unspecified cause: Secondary | ICD-10-CM

## 2010-12-27 DIAGNOSIS — E119 Type 2 diabetes mellitus without complications: Secondary | ICD-10-CM

## 2010-12-27 DIAGNOSIS — I10 Essential (primary) hypertension: Secondary | ICD-10-CM

## 2010-12-27 DIAGNOSIS — H6120 Impacted cerumen, unspecified ear: Secondary | ICD-10-CM

## 2010-12-27 MED ORDER — MOMETASONE FUROATE 0.1 % EX SOLN: Freq: Every day | CUTANEOUS | Status: DC

## 2010-12-27 NOTE — Progress Notes (Signed)
  Subjective:    Patient ID: Meredith Rodriguez, female    DOB: 11-Aug-1925, 77 y.o.   MRN: [id number]  HPI Medical followup. She has history of type 2 diabetes, hyperlipidemia, peripheral neuropathy, hypertension, IBS. Recent problems with left-sided neck pain with left radiculopathy symptoms. Plan upcoming surgery. Recently saw cardiologist. Medications reviewed. Compliant with all. Not monitoring blood pressures at home. Blood sugars usually low 100s fasting with recent A1c 6.4%.  Recently complained of some fullness in both ears. History of cerumen impaction previously. Also has persistent dry scaly pruritic rash both ear canals with no alleviating factors.  Past Medical History  Diagnosis Date  . Hypertension   . Diabetes mellitus   . Hyperkalemia   . IBS (irritable bowel syndrome)   . Hypercholesterolemia    Past Surgical History  Procedure Date  . Varicose veins     surgey 1959  . Tonsilectomy, adenoidectomy, bilateral myringotomy and tubes     reports that she has never smoked. She does not have any smokeless tobacco history on file. Her alcohol and drug histories not on file. family history is not on file. Allergies  Allergen Reactions  . Amoxicillin (Amoxil)     Upset stomah  . Cephalexin     nausea  . Metformin And Related     Gi problems  . Norvasc (Amlodipine Besylate)     edema  . Phenergan (Promethazine Hcl)   . Procardia (Nifedipine)     dizziness      Review of Systems  Constitutional: Negative for fatigue and unexpected weight change.  Eyes: Negative for visual disturbance.  Respiratory: Negative for cough, chest tightness, shortness of breath and wheezing.   Cardiovascular: Negative for chest pain, palpitations and leg swelling.  Neurological: Negative for dizziness, seizures, syncope, weakness, light-headedness and headaches.       Objective:   Physical Exam  Constitutional: She is oriented to person, place, and time. She appears well-developed and  well-nourished.  HENT:  Head: Normocephalic and atraumatic.       Patient is cerumen bilaterally. Removed with curet without difficulty  Eyes: EOM are normal. Pupils are equal, round, and reactive to light.  Neck: Normal range of motion. Neck supple. No thyromegaly present.  Cardiovascular: Normal rate, regular rhythm and normal heart sounds.   No murmur heard. Pulmonary/Chest: Breath sounds normal. No respiratory distress. She has no wheezes. She has no rales.  Abdominal: Soft. Bowel sounds are normal.  Musculoskeletal: Normal range of motion. She exhibits no edema.  Lymphadenopathy:    She has no cervical adenopathy.  Neurological: She is alert and oriented to person, place, and time. No cranial nerve deficit.  Skin: No rash noted.  Psychiatric: She has a normal mood and affect. Her behavior is normal. Judgment and thought content normal.          Assessment & Plan:  #1 bilateral cerumen impaction. Removed with curet without difficulty. #2 chronic eczema ear canals. Elocon lotion daily for when necessary use #3 elevated blood pressure. Monitor closely at home. Be in touch if consistently over 140/90. Return in 3 months for reassessment bring home cuff then #4 type 2 diabetes. Reassess A1c at followup

## 2010-12-27 NOTE — Patient Instructions (Signed)
Monitor blood pressure and be in touch if blood pressure consistently over 140/90.

## 2011-03-14 ENCOUNTER — Other Ambulatory Visit (INDEPENDENT_AMBULATORY_CARE_PROVIDER_SITE_OTHER): Payer: Medicare Other

## 2011-03-14 DIAGNOSIS — E785 Hyperlipidemia, unspecified: Secondary | ICD-10-CM

## 2011-03-14 LAB — LIPID PANEL
HDL: 50.1 mg/dL (ref >39.00)
Total CHOL/HDL Ratio: 2
VLDL: 14.4 mg/dL (ref 0.0–40.0)

## 2011-03-14 LAB — BASIC METABOLIC PANEL
CO2: 30 mEq/L (ref 19–32)
Chloride: 106 mEq/L (ref 96–112)
Glucose, Bld: 119 mg/dL — ABNORMAL HIGH (ref 70–99)
Potassium: 5 mEq/L (ref 3.5–5.1)
Sodium: 142 mEq/L (ref 135–145)

## 2011-03-14 LAB — HEPATIC FUNCTION PANEL
AST: 22 U/L (ref 0–37)
Total Bilirubin: 1.1 mg/dL (ref 0.3–1.2)

## 2011-03-18 ENCOUNTER — Ambulatory Visit (INDEPENDENT_AMBULATORY_CARE_PROVIDER_SITE_OTHER): Payer: Medicare Other | Admitting: Internal Medicine

## 2011-03-18 ENCOUNTER — Encounter: Payer: Self-pay | Admitting: Internal Medicine

## 2011-03-18 DIAGNOSIS — J069 Acute upper respiratory infection, unspecified: Secondary | ICD-10-CM

## 2011-03-18 DIAGNOSIS — I10 Essential (primary) hypertension: Secondary | ICD-10-CM

## 2011-03-18 DIAGNOSIS — F411 Generalized anxiety disorder: Secondary | ICD-10-CM

## 2011-03-18 DIAGNOSIS — E119 Type 2 diabetes mellitus without complications: Secondary | ICD-10-CM

## 2011-03-18 MED ORDER — PROMETHAZINE HCL 12.5 MG PO TABS: 12.5000 mg | ORAL_TABLET | Freq: Four times a day (QID) | ORAL | Status: AC | PRN

## 2011-03-18 NOTE — Progress Notes (Signed)
  Subjective:    Patient ID: Meredith Rodriguez, female    DOB: 1925-07-24, 77 y.o.   MRN: [id number]  HPI  77 year old patient who has a history of impaired glucose tolerance. Her diabetes is controlled without medications. For the past 2 days she has had increasing fever chills and some nausea. She has weakness hypersomnolence and just a general sense of unwellness. Denies any cough or shortness of breath; random blood sugar was elevated to 180. Recent laboratory studies were performed and a fasting blood sugar 119    Review of Systems  Constitutional: Positive for fever, chills, activity change, appetite change and fatigue.  HENT: Negative for hearing loss, congestion, sore throat, rhinorrhea, dental problem, sinus pressure and tinnitus.   Eyes: Negative for pain, discharge and visual disturbance.  Respiratory: Negative for cough and shortness of breath.   Cardiovascular: Negative for chest pain, palpitations and leg swelling.  Gastrointestinal: Positive for nausea. Negative for vomiting, abdominal pain, diarrhea, constipation, blood in stool and abdominal distention.  Genitourinary: Negative for dysuria, urgency, frequency, hematuria, flank pain, vaginal bleeding, vaginal discharge, difficulty urinating, vaginal pain and pelvic pain.  Musculoskeletal: Negative for joint swelling, arthralgias and gait problem.  Skin: Negative for rash.  Neurological: Positive for weakness. Negative for dizziness, syncope, speech difficulty, numbness and headaches.  Hematological: Negative for adenopathy.  Psychiatric/Behavioral: Negative for behavioral problems, dysphoric mood and agitation. The patient is not nervous/anxious.        Objective:   Physical Exam  Constitutional: She is oriented to person, place, and time. She appears well-developed and well-nourished.  HENT:  Head: Normocephalic.  Right Ear: External ear normal.  Left Ear: External ear normal.  Mouth/Throat: Oropharynx is clear and moist.    Eyes: Conjunctivae and EOM are normal. Pupils are equal, round, and reactive to light.  Neck: Normal range of motion. Neck supple. No thyromegaly present.  Cardiovascular: Normal rate, regular rhythm, normal heart sounds and intact distal pulses.   Pulmonary/Chest: Effort normal and breath sounds normal.  Abdominal: Soft. Bowel sounds are normal. She exhibits no mass. There is no tenderness.  Musculoskeletal: Normal range of motion.  Lymphadenopathy:    She has no cervical adenopathy.  Neurological: She is alert and oriented to person, place, and time.  Skin: Skin is warm and dry. No rash noted.  Psychiatric: She has a normal mood and affect. Her behavior is normal.          Assessment & Plan:    Probable mild viral syndrome Glucose intolerance. Slight aggravation with acute illness Hypertension stable  We'll treat symptomatically. Will call if unimproved

## 2011-03-18 NOTE — Patient Instructions (Signed)
Drink as much fluid as you  can tolerate over the next few days  Call or return to clinic prn if these symptoms worsen or fail to improve as anticipated.  

## 2011-03-19 ENCOUNTER — Telehealth: Payer: Self-pay | Admitting: *Deleted

## 2011-03-19 NOTE — Telephone Encounter (Signed)
Advised and printed copy for office visit

## 2011-03-19 NOTE — Telephone Encounter (Signed)
Message copied by Burnell Blanks on Tue Mar 19, 2011 11:30 AM ------      Message from: Cassell Clement      Created: Sun Mar 17, 2011  8:42 PM       Please report.  The labs are stable.  Continue same meds.  Continue careful diet.      Make a copy of these labs for office visit.

## 2011-03-22 ENCOUNTER — Ambulatory Visit (INDEPENDENT_AMBULATORY_CARE_PROVIDER_SITE_OTHER): Payer: Medicare Other | Admitting: Family Medicine

## 2011-03-22 ENCOUNTER — Encounter: Payer: Self-pay | Admitting: Family Medicine

## 2011-03-22 VITALS — BP 122/70 | Temp 101.4°F | Wt 110.0 lb

## 2011-03-22 DIAGNOSIS — R509 Fever, unspecified: Secondary | ICD-10-CM

## 2011-03-22 DIAGNOSIS — E119 Type 2 diabetes mellitus without complications: Secondary | ICD-10-CM

## 2011-03-22 DIAGNOSIS — R05 Cough: Secondary | ICD-10-CM

## 2011-03-22 LAB — CBC WITH DIFFERENTIAL/PLATELET
Eosinophils Absolute: 0.1 10*3/uL (ref 0.0–0.7)
Hemoglobin: 7.1 g/dL — ABNORMAL LOW (ref 12.0–15.0)
Lymphocytes Relative: 9 % — ABNORMAL LOW (ref 12–46)
Lymphs Abs: 1.2 10*3/uL (ref 0.7–4.0)
MCH: 36.4 pg — ABNORMAL HIGH (ref 26.0–34.0)
Monocytes Relative: 0 % — ABNORMAL LOW (ref 3–12)
Neutrophils Relative %: 89 % — ABNORMAL HIGH (ref 43–77)
Platelets: 574 10*3/uL — ABNORMAL HIGH (ref 150–400)
RBC: 1.95 MIL/uL — ABNORMAL LOW (ref 3.87–5.11)
WBC: 13.4 10*3/uL — ABNORMAL HIGH (ref 4.0–10.5)

## 2011-03-22 MED ORDER — ONDANSETRON HCL 4 MG PO TABS: 4.0000 mg | ORAL_TABLET | Freq: Three times a day (TID) | ORAL | Status: AC | PRN

## 2011-03-22 MED ORDER — LEVOFLOXACIN 500 MG PO TABS: 500.0000 mg | ORAL_TABLET | Freq: Every day | ORAL | Status: AC

## 2011-03-22 NOTE — Patient Instructions (Signed)
Follow up immediately for any vomiting, shortness or breath, or any other worsening symptoms.

## 2011-03-22 NOTE — Progress Notes (Signed)
  Subjective:    Patient ID: Meredith Rodriguez, female    DOB: 01-Jul-1925, 77 y.o.   MRN: [id number]  HPI  Acute visit. Patient gives history of onset this past Sunday of fatigue and some chills and decreased appetite. She did not have any definite fever at that point. She had some bodyaches. Went to urgent care negative influenza testing. Urinalysis unremarkable. Felt to be viral. Seen for followup here next day and suspected viral illness. No fever at that time.  Type 2 diabetes diet-controlled. Generally well controlled. Blood sugars over the past week have elevated around 200. New glucose meter. No symptoms of hyperglycemia.  Patient had fever over the past few days. Cough is nonproductive. No dyspnea at rest. No significant nasal congestion. Mild nausea with no vomiting. No diarrhea. No history of smoking.  Past Medical History  Diagnosis Date  . Hypertension   . Diabetes mellitus   . Hyperkalemia   . IBS (irritable bowel syndrome)   . Hypercholesterolemia    Past Surgical History  Procedure Date  . Varicose veins     surgey 1959  . Tonsilectomy, adenoidectomy, bilateral myringotomy and tubes     reports that she has never smoked. She does not have any smokeless tobacco history on file. Her alcohol and drug histories not on file. family history is not on file. Allergies  Allergen Reactions  . Amoxicillin (Amoxil)     Upset stomah  . Cephalexin     nausea  . Metformin And Related     Gi problems  . Norvasc (Amlodipine Besylate)     edema  . Phenergan (Promethazine Hcl)   . Procardia (Nifedipine)     dizziness      Review of Systems  Constitutional: Positive for fever, chills, appetite change and fatigue.  HENT: Negative for sore throat, neck stiffness, voice change and sinus pressure.   Respiratory: Positive for cough. Negative for wheezing.   Cardiovascular: Negative for chest pain.  Gastrointestinal: Negative for abdominal pain.  Genitourinary: Negative for dysuria.    Skin: Negative for rash.  Neurological: Negative for syncope and headaches.       Objective:   Physical Exam  Constitutional: She is oriented to person, place, and time. She appears well-developed and well-nourished.  HENT:  Right Ear: External ear normal.  Left Ear: External ear normal.  Mouth/Throat: Oropharynx is clear and moist.  Neck: Neck supple.  Cardiovascular: Normal rate and regular rhythm.   Pulmonary/Chest:       Normal respiratory rate. No retractions. No wheezes. Question of some faint rales left base.  Musculoskeletal: She exhibits no edema.  Lymphadenopathy:    She has no cervical adenopathy.  Neurological: She is alert and oriented to person, place, and time.  Skin: No rash noted.          Assessment & Plan:  Febrile illness with cough. Unable to get chest x-ray this time (as outpatient-4:45 PM). Check CBC. Clinically suspicious for possible early left lower lobe pneumonia. Probably following viral illness. No evidence for respiratory distress. Pulse oximetry 94%. Start Levaquin 500 milligrams daily for 10 days. Zofran 4 mg every 8 hours as needed for nausea. Reassess Monday and sooner as needed.  She knows to go to ER if any worsening symptoms over weekend.  Type 2 diabetes. Recent exacerbation possibly secondary to illness. Continue close monitoring.

## 2011-03-25 ENCOUNTER — Encounter: Payer: Self-pay | Admitting: Family Medicine

## 2011-03-25 ENCOUNTER — Ambulatory Visit (INDEPENDENT_AMBULATORY_CARE_PROVIDER_SITE_OTHER)
Admission: RE | Admit: 2011-03-25 | Discharge: 2011-03-25 | Disposition: A | Discharge status: Home / Self Care | Payer: Medicare Other | Source: Ambulatory Visit | Attending: Family Medicine | Admitting: Family Medicine

## 2011-03-25 ENCOUNTER — Telehealth: Payer: Self-pay

## 2011-03-25 ENCOUNTER — Ambulatory Visit (INDEPENDENT_AMBULATORY_CARE_PROVIDER_SITE_OTHER): Payer: Medicare Other | Admitting: Family Medicine

## 2011-03-25 VITALS — BP 122/60 | Temp 97.7°F | Wt 110.0 lb

## 2011-03-25 DIAGNOSIS — R05 Cough: Secondary | ICD-10-CM

## 2011-03-25 DIAGNOSIS — R509 Fever, unspecified: Secondary | ICD-10-CM

## 2011-03-25 DIAGNOSIS — Z79899 Other long term (current) drug therapy: Secondary | ICD-10-CM

## 2011-03-25 DIAGNOSIS — D649 Anemia, unspecified: Secondary | ICD-10-CM

## 2011-03-25 LAB — CBC WITH DIFFERENTIAL/PLATELET
Basophils Relative: 0.5 % (ref 0.0–3.0)
Eosinophils Relative: 1.3 % (ref 0.0–5.0)
HCT: 21.3 % — CL (ref 36.0–46.0)
Hemoglobin: 7 g/dL — CL (ref 12.0–15.0)
Lymphs Abs: 0.8 10*3/uL (ref 0.7–4.0)
MCV: 111.2 fl — ABNORMAL HIGH (ref 78.0–100.0)
Monocytes Absolute: 0 10*3/uL — ABNORMAL LOW (ref 0.1–1.0)
Monocytes Relative: 0.2 % — ABNORMAL LOW (ref 3.0–12.0)
Neutro Abs: 9.7 10*3/uL — ABNORMAL HIGH (ref 1.4–7.7)
Platelets: 481 10*3/uL — ABNORMAL HIGH (ref 150.0–400.0)
RBC: 1.92 Mil/uL — ABNORMAL LOW (ref 3.87–5.11)
WBC: 10.7 10*3/uL — ABNORMAL HIGH (ref 4.5–10.5)

## 2011-03-25 LAB — FERRITIN: Ferritin: 302.1 ng/mL — ABNORMAL HIGH (ref 10.0–291.0)

## 2011-03-25 NOTE — Telephone Encounter (Signed)
Tonya from [facility] called and stated pt has a hemoglobin of 7.0 and hematocrit of 21.3

## 2011-03-25 NOTE — Telephone Encounter (Signed)
hgb unchanged from Friday.  Will get back in to see GI soon.  Her WBC is somewhat improved since last week.

## 2011-03-25 NOTE — Progress Notes (Addendum)
  Subjective:    Patient ID: Meredith Rodriguez, female    DOB: November 01, 1925, 77 y.o.   MRN: [id number]  HPI  Patient seen for followup. Refer to prior note. Presented with febrile illness and cough. We started her Levaquin to cover for possible pneumonia. She presented here just before 5 PM and unable to get chest x-ray. Her fever has resolved. Still has cough unchanged. Lab work significant for elevated white blood count with neutrophils at 89%. Hemoglobin 7.1 which is down from several months ago. She's had some mild dizziness but no syncope. Patient has chronic elevated MCV with history of normal thyroid, normal B12, and normal folate recently.  Denies any abdominal pain. No melena. No hematemesis. Patient relates colonoscopy last spring we cannot find record of this. She relates this was normal.  Past Medical History  Diagnosis Date  . Hypertension   . Diabetes mellitus   . Hyperkalemia   . IBS (irritable bowel syndrome)   . Hypercholesterolemia    Past Surgical History  Procedure Date  . Varicose veins     surgey 1959  . Tonsilectomy, adenoidectomy, bilateral myringotomy and tubes     reports that she has never smoked. She does not have any smokeless tobacco history on file. Her alcohol and drug histories not on file. family history is not on file. Allergies  Allergen Reactions  . Amoxicillin (Amoxil)     Upset stomah  . Cephalexin     nausea  . Metformin And Related     Gi problems  . Norvasc (Amlodipine Besylate)     edema  . Phenergan (Promethazine Hcl)   . Procardia (Nifedipine)     dizziness       Review of Systems  Constitutional: Positive for fatigue. Negative for fever, chills, appetite change and unexpected weight change.  Respiratory: Positive for cough. Negative for shortness of breath and wheezing.   Cardiovascular: Negative for chest pain.  Gastrointestinal: Negative for abdominal pain and blood in stool.  Genitourinary: Negative for hematuria.  Neurological:  Negative for dizziness and syncope.  Hematological: Does not bruise/bleed easily.       Objective:   Physical Exam  Constitutional: She is oriented to person, place, and time. She appears well-developed and well-nourished. No distress.  HENT:  Mouth/Throat: Oropharynx is clear and moist.  Eyes:       Conjunctiva are pale  Neck: Neck supple.  Cardiovascular: Normal rate and regular rhythm.   Pulmonary/Chest: Effort normal and breath sounds normal.       Slightly decreased aeration left base compared to right  Abdominal: Soft. Bowel sounds are normal. There is no tenderness.  Lymphadenopathy:    She has no cervical adenopathy.  Neurological: She is alert and oriented to person, place, and time.          Assessment & Plan:  #1 febrile illness. Question community acquired pneumonia. Sent for chest x-ray. Overall improved with resolution of fever. Continue Levaquin. Repeat CBC #2 anemia. Macrocytic. History of normal folate, B12, and thyroid. No alcohol use. Check Hemoccults. Check iron studies. Repeat B12 level. May need to consider transfusion if hemoglobin below 7. Patient does not appear to have any acute loss as she has no acute symptoms of anemia or orthostasis  Hemoglobin stable at 7.0. Normal B12. Low serum iron. Elevated ferritin probably acute phase reactant from recent infection. Hemoccults given. Schedule followup with gastroenterology as soon as possible. She's had recent colonoscopy but may need EGD

## 2011-03-25 NOTE — Progress Notes (Signed)
Addended by: [name] on: 03/25/2011 06:48 PM   Modules accepted: Orders

## 2011-03-26 NOTE — Telephone Encounter (Signed)
Please advise 

## 2011-03-26 NOTE — Telephone Encounter (Signed)
Pt need chest xray results

## 2011-03-26 NOTE — Telephone Encounter (Signed)
Faint bilateral infiltrates.  Suspect she had/has pneumonia and we treated her for that with Levaquin. I do NOT think her anemia and cough/fever are related and she needs to get in to see her GI doctor as soon as possible.

## 2011-03-26 NOTE — Telephone Encounter (Signed)
Pt informed and she voiced her understanding.  GI referral appt pending.

## 2011-03-27 NOTE — Progress Notes (Signed)
Quick Note:  Pt informed and she voiced her understanding ______ 

## 2011-03-28 ENCOUNTER — Ambulatory Visit: Payer: Medicare Other | Admitting: Family Medicine

## 2011-03-29 ENCOUNTER — Encounter (HOSPITAL_BASED_OUTPATIENT_CLINIC_OR_DEPARTMENT_OTHER): Payer: Self-pay | Admitting: *Deleted

## 2011-03-29 ENCOUNTER — Other Ambulatory Visit: Payer: Self-pay

## 2011-03-29 ENCOUNTER — Emergency Department (INDEPENDENT_AMBULATORY_CARE_PROVIDER_SITE_OTHER): Payer: Medicare Other

## 2011-03-29 ENCOUNTER — Telehealth: Payer: Self-pay | Admitting: Family Medicine

## 2011-03-29 ENCOUNTER — Inpatient Hospital Stay (HOSPITAL_BASED_OUTPATIENT_CLINIC_OR_DEPARTMENT_OTHER)
Admission: EM | Admit: 2011-03-29 | Discharge: 2011-04-02 | DRG: 602 | Disposition: A | Discharge status: Home / Self Care | Payer: Medicare Other | Attending: Internal Medicine | Admitting: Internal Medicine

## 2011-03-29 DIAGNOSIS — D72829 Elevated white blood cell count, unspecified: Secondary | ICD-10-CM

## 2011-03-29 DIAGNOSIS — L03115 Cellulitis of right lower limb: Secondary | ICD-10-CM

## 2011-03-29 DIAGNOSIS — E119 Type 2 diabetes mellitus without complications: Secondary | ICD-10-CM

## 2011-03-29 DIAGNOSIS — F411 Generalized anxiety disorder: Secondary | ICD-10-CM

## 2011-03-29 DIAGNOSIS — R05 Cough: Secondary | ICD-10-CM

## 2011-03-29 DIAGNOSIS — N952 Postmenopausal atrophic vaginitis: Secondary | ICD-10-CM

## 2011-03-29 DIAGNOSIS — I509 Heart failure, unspecified: Secondary | ICD-10-CM | POA: Diagnosis present

## 2011-03-29 DIAGNOSIS — E875 Hyperkalemia: Secondary | ICD-10-CM

## 2011-03-29 DIAGNOSIS — E785 Hyperlipidemia, unspecified: Secondary | ICD-10-CM

## 2011-03-29 DIAGNOSIS — J449 Chronic obstructive pulmonary disease, unspecified: Secondary | ICD-10-CM

## 2011-03-29 DIAGNOSIS — K589 Irritable bowel syndrome without diarrhea: Secondary | ICD-10-CM

## 2011-03-29 DIAGNOSIS — R600 Localized edema: Secondary | ICD-10-CM

## 2011-03-29 DIAGNOSIS — R609 Edema, unspecified: Secondary | ICD-10-CM

## 2011-03-29 DIAGNOSIS — K644 Residual hemorrhoidal skin tags: Secondary | ICD-10-CM

## 2011-03-29 DIAGNOSIS — I5033 Acute on chronic diastolic (congestive) heart failure: Secondary | ICD-10-CM | POA: Diagnosis present

## 2011-03-29 DIAGNOSIS — IMO0001 Reserved for inherently not codable concepts without codable children: Secondary | ICD-10-CM | POA: Diagnosis present

## 2011-03-29 DIAGNOSIS — G609 Hereditary and idiopathic neuropathy, unspecified: Secondary | ICD-10-CM

## 2011-03-29 DIAGNOSIS — L02419 Cutaneous abscess of limb, unspecified: Principal | ICD-10-CM | POA: Diagnosis present

## 2011-03-29 DIAGNOSIS — J189 Pneumonia, unspecified organism: Secondary | ICD-10-CM

## 2011-03-29 DIAGNOSIS — R0609 Other forms of dyspnea: Secondary | ICD-10-CM

## 2011-03-29 DIAGNOSIS — I10 Essential (primary) hypertension: Secondary | ICD-10-CM

## 2011-03-29 DIAGNOSIS — E7800 Pure hypercholesterolemia, unspecified: Secondary | ICD-10-CM

## 2011-03-29 DIAGNOSIS — D649 Anemia, unspecified: Secondary | ICD-10-CM

## 2011-03-29 DIAGNOSIS — E871 Hypo-osmolality and hyponatremia: Secondary | ICD-10-CM

## 2011-03-29 DIAGNOSIS — J90 Pleural effusion, not elsewhere classified: Secondary | ICD-10-CM

## 2011-03-29 DIAGNOSIS — L03119 Cellulitis of unspecified part of limb: Secondary | ICD-10-CM | POA: Diagnosis present

## 2011-03-29 DIAGNOSIS — H6120 Impacted cerumen, unspecified ear: Secondary | ICD-10-CM

## 2011-03-29 HISTORY — DX: Radiculopathy, cervical region: M54.12

## 2011-03-29 LAB — PRO B NATRIURETIC PEPTIDE: Pro B Natriuretic peptide (BNP): 4890 pg/mL — ABNORMAL HIGH (ref 0–450)

## 2011-03-29 LAB — CBC
MCHC: 34.7 g/dL (ref 30.0–36.0)
MCV: 107.4 fL — ABNORMAL HIGH (ref 78.0–100.0)
Platelets: 634 10*3/uL — ABNORMAL HIGH (ref 150–400)
RDW: 18.1 % — ABNORMAL HIGH (ref 11.5–15.5)
WBC: 22.5 10*3/uL — ABNORMAL HIGH (ref 4.0–10.5)

## 2011-03-29 LAB — BASIC METABOLIC PANEL
Calcium: 9.7 mg/dL (ref 8.4–10.5)
Creatinine, Ser: 0.5 mg/dL (ref 0.50–1.10)
GFR calc non Af Amer: 87 mL/min — ABNORMAL LOW (ref >90)
Glucose, Bld: 174 mg/dL — ABNORMAL HIGH (ref 70–99)
Sodium: 133 mEq/L — ABNORMAL LOW (ref 135–145)

## 2011-03-29 LAB — CARDIAC PANEL(CRET KIN+CKTOT+MB+TROPI)
CK, MB: 2.6 ng/mL (ref 0.3–4.0)
Relative Index: INVALID (ref 0.0–2.5)
Total CK: 28 U/L (ref 7–177)

## 2011-03-29 MED ORDER — DEXTROSE 5 % IV SOLN
1.0000 g | Freq: Once | INTRAVENOUS | Status: AC
  Administered 2011-03-29: 1 g via INTRAVENOUS
  Filled 2011-03-29: qty 10

## 2011-03-29 MED ORDER — SIMVASTATIN 5 MG PO TABS: 5.0000 mg | ORAL_TABLET | Freq: Every day | ORAL | Status: DC

## 2011-03-29 MED ORDER — DEXTROSE 5 % IV SOLN
500.0000 mg | Freq: Once | INTRAVENOUS | Status: AC
  Administered 2011-03-29: 500 mg via INTRAVENOUS
  Filled 2011-03-29: qty 500

## 2011-03-29 MED ORDER — INSULIN ASPART 100 UNIT/ML ~~LOC~~ SOLN
0.0000 [IU] | Freq: Three times a day (TID) | SUBCUTANEOUS | Status: DC
  Administered 2011-03-30 (×2): 3 [IU] via SUBCUTANEOUS
  Administered 2011-03-30: 2 [IU] via SUBCUTANEOUS
  Filled 2011-03-29 (×2): qty 3

## 2011-03-29 MED ORDER — INSULIN ASPART 100 UNIT/ML ~~LOC~~ SOLN: 0.0000 [IU] | Freq: Every day | SUBCUTANEOUS | Status: DC

## 2011-03-29 MED ORDER — FUROSEMIDE 10 MG/ML IJ SOLN
20.0000 mg | Freq: Once | INTRAMUSCULAR | Status: AC
  Administered 2011-03-29: 20 mg via INTRAVENOUS
  Filled 2011-03-29: qty 2

## 2011-03-29 NOTE — Telephone Encounter (Signed)
I called pt, she is reporting no improvement since Monday, and has 3 Levaquin left, taking 1 a day.  She has ongoing cough, denies fever.  The past few days she has had increased swelling to bilateral lower legs and ankles, "I can't hardly walk, they are very painful". Please advise

## 2011-03-29 NOTE — ED Provider Notes (Signed)
History     CSN: [id number] Arrival date & time: 03/29/2011  6:00 PM   First MD Initiated Contact with Patient 03/29/11 1825      Chief Complaint  Patient presents with  . Leg Swelling   the patient reports swelling in the bilateral lower extremities since Monday. She did see her primary doctor on Monday and had a chest x-ray done which showed COPD and possible pneumonia. She has been on antibiotics. Patient complains of minimal shortness of breath, denies any chest pain. She's had no back pain. Patient is unsure whether she has had any fevers. She did note the edema around her inner thighs as well.  Patient does have a history of anemia, for which there was a workup in progress.  (Consider location/radiation/quality/duration/timing/severity/associated sxs/prior treatment) HPI  Past Medical History  Diagnosis Date  . Hypertension   . Diabetes mellitus   . Hyperkalemia   . IBS (irritable bowel syndrome)   . Hypercholesterolemia     Past Surgical History  Procedure Date  . Varicose veins     surgey 1959  . Tonsilectomy, adenoidectomy, bilateral myringotomy and tubes     History reviewed. No pertinent family history.  History  Substance Use Topics  . Smoking status: Never Smoker   . Smokeless tobacco: Not on file  . Alcohol Use: No    OB History    Grav Para Term Preterm Abortions TAB SAB Ect Mult Living                  Review of Systems  All other systems reviewed and are negative.    Allergies  Amoxicillin; Cephalexin; Metformin and related; Norvasc; Phenergan; and Procardia  Home Medications   Current Outpatient Rx  Name Route Sig Dispense Refill  . AMLODIPINE BESYLATE 5 MG PO TABS Oral Take 1 tablet (5 mg total) by mouth daily. 90 tablet 3  . VITAMIN C 500 MG PO TABS Oral Take 500 mg by mouth daily.      . ASPIRIN 81 MG PO TABS Oral Take 81 mg by mouth daily.      Marland Kitchen LEVOFLOXACIN 500 MG PO TABS Oral Take 1 tablet (500 mg total) by mouth daily. 10 tablet  0  . METOPROLOL TARTRATE 50 MG PO TABS Oral Take 50 mg by mouth 2 (two) times daily.      . MOMETASONE FUROATE 0.1 % EX SOLN Topical Apply topically daily. 60 mL 2  . MULTIPLE VITAMIN PO Oral Take by mouth daily.      Marland Kitchen FISH OIL 1000 MG PO CAPS Oral Take by mouth.      . ONDANSETRON HCL 4 MG PO TABS Oral Take 1 tablet (4 mg total) by mouth every 8 (eight) hours as needed for nausea. 12 tablet 0  . PREMARIN 0.625 MG/GM VA CREA      . SIMVASTATIN 5 MG PO TABS Oral Take 1 tablet (5 mg total) by mouth at bedtime. 90 tablet 1  . TRAMADOL HCL 50 MG PO TABS  As needed      BP 171/63  Pulse 98  Temp(Src) 97.9 F (36.6 C) (Oral)  Resp 18  SpO2 99%  Physical Exam  Constitutional: She is oriented to person, place, and time. She appears well-developed and well-nourished.  HENT:  Head: Normocephalic and atraumatic.  Eyes: Conjunctivae and EOM are normal. Pupils are equal, round, and reactive to light.  Neck: Neck supple.  Cardiovascular: Normal rate and regular rhythm.  Exam reveals no gallop and  no friction rub.   No murmur heard. Pulmonary/Chest: Breath sounds normal. She has no wheezes. She has no rales. She exhibits no tenderness.  Abdominal: Soft. Bowel sounds are normal. She exhibits no distension. There is no tenderness. There is no rebound and no guarding.  Musculoskeletal: Normal range of motion. She exhibits edema.       1+ pitting edema in the lower extremities and mild edema in the upper thighs., Soft tissues  Neurological: She is alert and oriented to person, place, and time. No cranial nerve deficit. Coordination normal.  Skin: Skin is warm and dry. No rash noted.  Psychiatric: She has a normal mood and affect.    ED Course  Procedures (including critical care time)  Labs Reviewed - No data to display No results found.   No diagnosis found.    MDM  Pt is seen and examined;  Initial history and physical completed.  Will follow.          Damonte Frieson A. Patrica Duel,  MD 03/31/11 [phone]

## 2011-03-29 NOTE — ED Notes (Signed)
MD at bedside. 

## 2011-03-29 NOTE — ED Notes (Signed)
Secondary Assessment- Pt reports "losing blood" and generalized weakness.  She has been seen by PMD and has an appointment with GI next week, however feels weak today.  Bilateral medial thighs are red and warm to touch.  She reports recent swelling that is improved today.  Denies blood in urine or stool.  Denies pain.  Lungs are diminished in bilateral bases.  She is presently being treated for pneumonia.

## 2011-03-29 NOTE — H&P (Signed)
PCP:   [name], MD  Cardiology: Leonarda Salon cardiology GI: Adelene Amas  Chief Complaint:  Weak/tired for months, leg swelling for 1 wk  HPI: Meredith Rodriguez is a 77 y.o. female   has a past medical history of Hypertension; Diabetes mellitus; Hyperkalemia; IBS (irritable bowel syndrome); Hypercholesterolemia; and Cervical radiculopathy.   Presented with  Tired for the past one week, she had presented to urgent care last week and was diagnosed with pneumonia she was started on Levaquin. For past one week she also noticed low extremity edema and particularly worse between her thighs with some redness. She denies having fevers although had occasional chills. Her regular doctor noted that she had severe anemia of hemoglobin down to some 0.0 and she was supposed to have this worked up she is supposed to have an endoscopy done by her 5 GI specialist on Monday. Since she did develop edema at this week she was asked by her PCP office to come to emergency department to be evaluated for this.  She has also noticed some orthopnea as well. No chest pain.  Concerning her anemia she does feel weak she denies any blood in his stool. She states she had a colonoscopy can't remember when. Concerning her recent diagnoses of pneumonia sounds if she has been on Levaquin for almost a week or so the cough is starting to improve.  Review of Systems:    Pertinent Positives: Chills, leg swelling/redness, orthopnea  Pertinent Negatives: No fever no chest pain no nausea no vomiting no diarrhea constipation no localized neurological complaints Otherwise ROS are negative except for above, 10 systems were reviewed  Past Medical History: Past Medical History  Diagnosis Date  . Hypertension   . Diabetes mellitus   . Hyperkalemia   . IBS (irritable bowel syndrome)   . Hypercholesterolemia   . Cervical radiculopathy    Past Surgical History  Procedure Date  . Varicose veins     surgey 1959  .  Tonsilectomy, adenoidectomy, bilateral myringotomy and tubes      Medications: Prior to Admission medications   Medication Sig Start Date End Date Taking? Authorizing Provider  amLODipine (NORVASC) 5 MG tablet Take 1 tablet (5 mg total) by mouth daily. 09/11/10  Yes Cassell Clement, MD  Ascorbic Acid (VITAMIN C) 500 MG tablet Take 500 mg by mouth daily.     Yes Historical Provider, MD  aspirin 81 MG tablet Take 81 mg by mouth daily.     Yes Historical Provider, MD  levofloxacin (LEVAQUIN) 500 MG tablet Take 1 tablet (500 mg total) by mouth daily. 03/22/11 04/01/11 Yes Bruce W Burchette  metoprolol (LOPRESSOR) 50 MG tablet Take 50 mg by mouth 2 (two) times daily.     Yes Historical Provider, MD  mometasone (ELOCON) 0.1 % lotion Apply 1 application topically daily.   12/27/10 12/27/11 Yes Bruce W Burchette  MULTIPLE VITAMIN PO Take 1 tablet by mouth daily.    Yes Historical Provider, MD  Omega-3 Fatty Acids (FISH OIL) 1000 MG CAPS Take 1,000 mg by mouth daily.    Yes Historical Provider, MD  ondansetron (ZOFRAN) 4 MG tablet Take 1 tablet (4 mg total) by mouth every 8 (eight) hours as needed for nausea. 03/22/11 03/29/11 Yes Bruce W Burchette  simvastatin (ZOCOR) 5 MG tablet Take 1 tablet (5 mg total) by mouth at bedtime. 03/29/11  Yes Cassell Clement, MD  traMADol (ULTRAM) 50 MG tablet Take 50 mg by mouth every 6 (six) hours as needed. For  pain. 09/12/10  Yes Historical Provider, MD    Allergies:   Allergies  Allergen Reactions  . Amoxicillin (Amoxil)     Upset stomach  . Cephalexin Nausea Only  . Metformin And Related     Gi problems  . Norvasc (Amlodipine Besylate)     edema  . Phenergan (Promethazine Hcl)   . Procardia (Nifedipine)     dizziness    Social History:  reports that she has never smoked. She does not have any smokeless tobacco history on file. She reports that she does not drink alcohol or use illicit drugs.   Family History: family history includes Cancer in her  daughter and sisters; Diabetes in her sister; Hypertension in her father; and Stroke in her mother.    Physical Exam: Patient Vitals for the past 24 hrs:  BP Temp Temp src Pulse Resp SpO2 Height Weight  03/29/11 2252 132/64 mmHg 98.1 F (36.7 C) Oral 116  20  90 % 5\' 4"  (1.626 m) 47.582 kg (104 lb 14.4 oz)  03/29/11 2147 142/55 mmHg 98.6 F (37 C) - - 17  - - -  03/29/11 2111 148/44 mmHg - - - 19  100 % - -  03/29/11 2013 147/51 mmHg - - - 18  100 % - -  03/29/11 1905 151/55 mmHg - - 92  16  98 % - -  03/29/11 1813 171/63 mmHg 97.9 F (36.6 C) Oral 98  18  99 % - -    Alert and Oriented in No Acute distress Mucous Membranes and Skin: Moist mucous membranes normal skin turgor Head Non traumatic neck supple Heart: Rapid but regular no murmurs could be appreciated Lungs: I do not appreciate any significant crackles but they're somewhat diminished air movement throughout Abdomen: Nontender nondistended Lower extremities: No peripheral edema at this time could be appreciated she does have some redness on the thighs bilaterally with some pitting edema which would be more consistent with localized cellulitis Neurologically Grossly intact:  Skin clean Dry and intact: Redness present in between the thighs bilaterally  body mass index is 18.01 kg/(m^2).   Labs on Admission:   [facility] 03/29/11 1838  NA 133*  K 4.1  CL 94*  CO2 28  GLUCOSE 174*  BUN 14  CREATININE 0.50  CALCIUM 9.7  MG --  PHOS --   No results found for this basename: AST:2,ALT:2,ALKPHOS:2,BILITOT:2,PROT:2,ALBUMIN:2 in the last 72 hours No results found for this basename: LIPASE:2,AMYLASE:2 in the last 72 hours  Basename 03/29/11 1838  WBC 22.5*  NEUTROABS --  HGB 7.0*  HCT 20.2*  MCV 107.4*  PLT 634*    Basename 03/29/11 1838  CKTOTAL 28  CKMB 2.6  CKMBINDEX --  TROPONINI <0.30   No results found for this basename: TSH,T4TOTAL,FREET3,T3FREE,THYROIDAB in the last 72 hours No results found for this  basename: VITAMINB12:2,FOLATE:2,FERRITIN:2,TIBC:2,IRON:2,RETICCTPCT:2 in the last 72 hours Lab Results  Component Value Date   HGBA1C 7.0* 09/26/2010    Estimated Creatinine Clearance: 40 ml/min (by C-G formula based on Cr of 0.5). ABG No results found for this basename: phart, pco2, po2, hco3, tco2, acidbasedef, o2sat     No results found for this basename: DDIMER     Other results: ED ECG REPORT  Rate: 100  Rhythm: Normal sinus rhythm ST&T Change: Possible left atrial enlargement no ischemic changes    Blood Culture No results found for this basename: sdes, specrequest, cult, reptstatus       Radiological Exams on Admission: Dg Chest 2  View  03/29/2011  *RADIOLOGY REPORT*  Clinical Data: Cough.  Difficulty breathing.  CHEST - 2 VIEW  Comparison: 03/25/2011  Findings: There is hyperinflation of the lungs compatible with COPD.  There are bilateral pleural effusions and bibasilar atelectasis, similar to prior study.  Heart is mildly enlarged.  No change since prior study.  No acute bony abnormality.  IMPRESSION: COPD.  Small effusions and bibasilar atelectasis.  No change.  Original Report Authenticated By: Cyndie Chime, M.D.    Assessment/Plan  Present on Admission:    .Anemia - etiology unclear at this point we'll order an anemia panel Hemoccult stool we'll transfuse her 2 units since she is symptomatic with tachycardia and shortness of breath as well as feeling of fatigue. Will need a GI consult during this admission if Hemoccult positive she, of note she does carry diagnosis of iron deficient anemia baseline hemoglobin is around 10  .Edema - peripheral edema at this point has resolved she states that improved with bedrest. Cora Collum for possible heart failure, will stop her Norvasc as this can induce edema  .Cellulitis of leg - this swelling in her thighs more consistent with cellulitis will cover with vancomycin  .CHF (congestive heart failure) - elevated BNP orthopnea  mild fluid overload and peripheral edema all indicative of possible heart failure will obtain 2-D echo to father a party this, will cycle cardiac enzymes and obtain serial EKG, check TSH.  If abnormal echogram would recommend [facility] cardiology consult as she is well known to Dr. Patty Sermons  .Leukocytosis - [place] secondary to cellulitis versus pneumonia  .Pneumonia - I think at this point it's improving will cover favor Rocephin and azithromycin .DIABETES MELLITUS II, UNCOMPLICATED - will put on sliding scale, hemoglobin A1c 7.0 .HYPERTENSION, BENIGN SYSTEMIC Prophylaxis:  Protonix and SCD  CODE STATUS: Patient wishes to BE full code   Kale Rondeau 03/29/2011, 11:45 PM

## 2011-03-29 NOTE — ED Notes (Signed)
Pt report given to Renae Fickle, Charity fundraiser with [facility].

## 2011-03-29 NOTE — Telephone Encounter (Signed)
Pt still isnt feeling well and is requesting you contact her

## 2011-03-29 NOTE — ED Notes (Signed)
Patient transported to X-ray and returned 

## 2011-03-29 NOTE — Telephone Encounter (Signed)
I spoke with pt and told her the gastro Appt is in the works with Deboraha Sprang, Camelia Eng will look into it today.  She is experiencing same SOB she had at OV Monday, denies fever.  She is elevating her legs, but is having so much pain she can hardly walk to the bathroom.  Pt was instructed to go to the ER, even call for transport is necessary this weekend if her sx progress.  She was scheduled for OV Monday, however understands if she goes to the ER, we can cancel it Monday

## 2011-03-29 NOTE — ED Notes (Signed)
[facility] here for pt transport. IV site unremarkable, IV zithromax infusing without diff. Pt alert and oriented upon transfer, SR on monitor, pt denies pain.

## 2011-03-29 NOTE — ED Notes (Signed)
Several days of lower extremity swelling pt has appt with endo on Monday next week due to low hemoglobin counts

## 2011-03-29 NOTE — Telephone Encounter (Signed)
Make sure she has appointment with gastroenterology regarding her anemia. Given her age and anemia high risk for congestive heart failure. If she has any increased associated dyspnea may need ER evaluation. If no dyspnea and no fever elevate legs frequently and let's plan to assess this Monday and sooner as needed

## 2011-03-30 LAB — RETICULOCYTES
Retic Count, Absolute: 49.1 10*3/uL (ref 19.0–186.0)
Retic Ct Pct: 2.7 % (ref 0.4–3.1)

## 2011-03-30 LAB — DIFFERENTIAL
Basophils Absolute: 0 10*3/uL (ref 0.0–0.1)
Eosinophils Relative: 1 % (ref 0–5)
Lymphocytes Relative: 9 % — ABNORMAL LOW (ref 12–46)
Lymphs Abs: 1.5 10*3/uL (ref 0.7–4.0)
Monocytes Relative: 1 % — ABNORMAL LOW (ref 3–12)
Neutrophils Relative %: 89 % — ABNORMAL HIGH (ref 43–77)
WBC Morphology: INCREASED

## 2011-03-30 LAB — CBC
MCV: 97.6 fL (ref 78.0–100.0)
Platelets: 598 10*3/uL — ABNORMAL HIGH (ref 150–400)
RBC: 3.32 MIL/uL — ABNORMAL LOW (ref 3.87–5.11)
RDW: 22.3 % — ABNORMAL HIGH (ref 11.5–15.5)
WBC: 16.6 10*3/uL — ABNORMAL HIGH (ref 4.0–10.5)

## 2011-03-30 LAB — IRON AND TIBC
Iron: 64 ug/dL (ref 42–135)
TIBC: 204 ug/dL — ABNORMAL LOW (ref 250–470)

## 2011-03-30 LAB — GLUCOSE, CAPILLARY
Glucose-Capillary: 167 mg/dL — ABNORMAL HIGH (ref 70–99)
Glucose-Capillary: 136 mg/dL — ABNORMAL HIGH (ref 70–99)
Glucose-Capillary: 164 mg/dL — ABNORMAL HIGH (ref 70–99)

## 2011-03-30 LAB — BASIC METABOLIC PANEL
CO2: 31 mEq/L (ref 19–32)
Calcium: 9.3 mg/dL (ref 8.4–10.5)
Chloride: 97 mEq/L (ref 96–112)
Creatinine, Ser: 0.67 mg/dL (ref 0.50–1.10)
GFR calc Af Amer: >90 mL/min (ref >90)
Sodium: 137 mEq/L (ref 135–145)

## 2011-03-30 LAB — TSH: TSH: 2.906 u[IU]/mL (ref 0.350–4.500)

## 2011-03-30 LAB — VITAMIN B12: Vitamin B-12: >2000 pg/mL — ABNORMAL HIGH (ref 211–911)

## 2011-03-30 LAB — PREPARE RBC (CROSSMATCH)

## 2011-03-30 LAB — CARDIAC PANEL(CRET KIN+CKTOT+MB+TROPI): Troponin I: <0.3 ng/mL (ref <0.30)

## 2011-03-30 LAB — FERRITIN: Ferritin: 442 ng/mL — ABNORMAL HIGH (ref 10–291)

## 2011-03-30 MED ORDER — METOPROLOL TARTRATE 50 MG PO TABS
50.0000 mg | ORAL_TABLET | Freq: Two times a day (BID) | ORAL | Status: DC
  Administered 2011-03-30 – 2011-04-02 (×7): 50 mg via ORAL
  Filled 2011-03-30 (×8): qty 1

## 2011-03-30 MED ORDER — FUROSEMIDE 40 MG PO TABS
40.0000 mg | ORAL_TABLET | Freq: Every day | ORAL | Status: DC
  Administered 2011-03-31 – 2011-04-02 (×3): 40 mg via ORAL
  Filled 2011-03-30 (×3): qty 1

## 2011-03-30 MED ORDER — ONDANSETRON HCL 4 MG/2ML IJ SOLN: 4.0000 mg | Freq: Four times a day (QID) | INTRAMUSCULAR | Status: DC | PRN

## 2011-03-30 MED ORDER — VANCOMYCIN HCL 1000 MG IV SOLR
750.0000 mg | INTRAVENOUS | Status: DC
  Administered 2011-03-30 – 2011-04-01 (×3): 750 mg via INTRAVENOUS
  Filled 2011-03-30 (×4): qty 750

## 2011-03-30 MED ORDER — ALBUTEROL SULFATE (5 MG/ML) 0.5% IN NEBU: 2.5000 mg | INHALATION_SOLUTION | RESPIRATORY_TRACT | Status: DC | PRN

## 2011-03-30 MED ORDER — INSULIN ASPART 100 UNIT/ML ~~LOC~~ SOLN
0.0000 [IU] | Freq: Three times a day (TID) | SUBCUTANEOUS | Status: DC
  Administered 2011-03-31 – 2011-04-01 (×4): 2 [IU] via SUBCUTANEOUS
  Administered 2011-04-01: 1 [IU] via SUBCUTANEOUS
  Administered 2011-04-02: 3 [IU] via SUBCUTANEOUS
  Administered 2011-04-02: 1 [IU] via SUBCUTANEOUS
  Filled 2011-03-30: qty 3

## 2011-03-30 MED ORDER — PANTOPRAZOLE SODIUM 40 MG IV SOLR
40.0000 mg | Freq: Two times a day (BID) | INTRAVENOUS | Status: DC
  Administered 2011-03-30 – 2011-04-02 (×5): 40 mg via INTRAVENOUS
  Filled 2011-03-30 (×9): qty 40

## 2011-03-30 MED ORDER — ZOLPIDEM TARTRATE 5 MG PO TABS
5.0000 mg | ORAL_TABLET | Freq: Every evening | ORAL | Status: DC | PRN
  Administered 2011-03-30 – 2011-04-01 (×3): 5 mg via ORAL
  Filled 2011-03-30 (×3): qty 1

## 2011-03-30 MED ORDER — FUROSEMIDE 10 MG/ML IJ SOLN
20.0000 mg | Freq: Once | INTRAMUSCULAR | Status: AC
  Administered 2011-03-30: 20 mg via INTRAVENOUS
  Filled 2011-03-30: qty 2

## 2011-03-30 MED ORDER — GUAIFENESIN-DM 100-10 MG/5ML PO SYRP
5.0000 mL | ORAL_SOLUTION | ORAL | Status: DC | PRN
  Filled 2011-03-30: qty 5

## 2011-03-30 MED ORDER — ONDANSETRON HCL 4 MG PO TABS: 4.0000 mg | ORAL_TABLET | Freq: Three times a day (TID) | ORAL | Status: DC | PRN

## 2011-03-30 MED ORDER — DEXTROSE 5 % IV SOLN
1.0000 g | INTRAVENOUS | Status: DC
  Administered 2011-03-30 – 2011-03-31 (×2): 1 g via INTRAVENOUS
  Filled 2011-03-30 (×3): qty 10

## 2011-03-30 MED ORDER — HYDROCODONE-ACETAMINOPHEN 5-325 MG PO TABS: 1.0000 | ORAL_TABLET | ORAL | Status: DC | PRN

## 2011-03-30 MED ORDER — ACETAMINOPHEN 650 MG RE SUPP: 650.0000 mg | Freq: Four times a day (QID) | RECTAL | Status: DC | PRN

## 2011-03-30 MED ORDER — ACETAMINOPHEN 325 MG PO TABS
650.0000 mg | ORAL_TABLET | Freq: Once | ORAL | Status: AC
  Administered 2011-03-30: 650 mg via ORAL
  Filled 2011-03-30: qty 2

## 2011-03-30 MED ORDER — DIPHENHYDRAMINE HCL 25 MG PO CAPS
25.0000 mg | ORAL_CAPSULE | Freq: Once | ORAL | Status: AC
  Administered 2011-03-30: 25 mg via ORAL
  Filled 2011-03-30: qty 1

## 2011-03-30 MED ORDER — DEXTROSE 5 % IV SOLN
500.0000 mg | INTRAVENOUS | Status: DC
  Administered 2011-03-30 – 2011-03-31 (×2): 500 mg via INTRAVENOUS
  Filled 2011-03-30 (×3): qty 500

## 2011-03-30 MED ORDER — ALUM & MAG HYDROXIDE-SIMETH 200-200-20 MG/5ML PO SUSP: 30.0000 mL | Freq: Four times a day (QID) | ORAL | Status: DC | PRN

## 2011-03-30 MED ORDER — SODIUM CHLORIDE 0.9 % IV SOLN
500.0000 mL | Freq: Once | INTRAVENOUS | Status: AC
  Administered 2011-03-30: 500 mL via INTRAVENOUS

## 2011-03-30 MED ORDER — ONDANSETRON HCL 4 MG PO TABS: 4.0000 mg | ORAL_TABLET | Freq: Four times a day (QID) | ORAL | Status: DC | PRN

## 2011-03-30 MED ORDER — ACETAMINOPHEN 325 MG PO TABS
650.0000 mg | ORAL_TABLET | Freq: Four times a day (QID) | ORAL | Status: DC | PRN
  Administered 2011-03-30: 650 mg via ORAL
  Filled 2011-03-30: qty 2

## 2011-03-30 MED ORDER — SIMVASTATIN 5 MG PO TABS
5.0000 mg | ORAL_TABLET | Freq: Every day | ORAL | Status: DC
  Administered 2011-03-30 – 2011-04-01 (×3): 5 mg via ORAL
  Filled 2011-03-30 (×4): qty 1

## 2011-03-30 NOTE — Progress Notes (Signed)
ANTIBIOTIC CONSULT NOTE - INITIAL  Pharmacy Consult for Vancomycin Indication: leg cellulitis  Allergies  Allergen Reactions  . Amoxicillin (Amoxil)     Upset stomach  . Cephalexin Nausea Only  . Metformin And Related     Gi problems  . Norvasc (Amlodipine Besylate)     edema  . Phenergan (Promethazine Hcl)   . Procardia (Nifedipine)     dizziness    Patient Measurements: Height: 5\' 4"  (162.6 cm) Weight: 104 lb 14.4 oz (47.582 kg) IBW/kg (Calculated) : 54.7   Vital Signs: Temp: 98.1 F (36.7 C) (11/30 2252) Temp src: Oral (11/30 2252) BP: 132/64 mmHg (11/30 2252) Pulse Rate: 116  (11/30 2252)  Intake/Output from previous day: 11/30 0701 - 12/01 0700 In: -  Out: 1675 [Urine:1675] Intake/Output from this shift: Total I/O In: -  Out: 1675 [Urine:1675]  Labs:  [facility] 03/29/11 1838  WBC 22.5*  HGB 7.0*  PLT 634*  LABCREA --  CREATININE 0.50   Estimated Creatinine Clearance: 40 ml/min (by C-G formula based on Cr of 0.5).  Microbiology: No results found for this or any previous visit (from the past 720 hour(s)).  Medical History: Past Medical History  Diagnosis Date  . Hypertension   . Diabetes mellitus   . Hyperkalemia   . IBS (irritable bowel syndrome)   . Hypercholesterolemia   . Cervical radiculopathy     Assessment: 78 yo F to start Vancomycin for LE cellulitis.  Noted wt ~ 47kg, CrCl~40, WBC 22.5  Goal of Therapy:  Vancomycin trough level 10-15 mcg/ml  Plan:  Start Vancomycin 750 mg IV Q24 hours.   Follow renal function, culture data, and clinical progress. Check Vancomycin trough at 5-7 days if therapy continues.  [employer], Pharm.D., BCPS Clinical Pharmacist Pager [phone]  03/30/2011,2:27 AM

## 2011-03-30 NOTE — Progress Notes (Signed)
Subjective: Patient gives history of bilateral leg swellings on going for a week and more recent onset of pain between her thighs with associated redness. She had dyspnea and cough. Appetite was poor. Since admission patient says she's feeling better. She is tolerated her breakfast well without any nausea which was present right to admission. She denies history of vomiting, melena or blood in stools. She was scheduled to see the gastroenterologist on Monday for evaluation of her anemia. She denies chest pain.  Objective: Blood pressure 157/74, pulse 86, temperature 98.5 F (36.9 C), temperature source Oral, resp. rate 18, height 5\' 4"  (1.626 m), weight 47.582 kg (104 lb 14.4 oz), SpO2 100.00%.  Intake/Output Summary (Last 24 hours) at 03/30/11 1732 Last data filed at 03/30/11 1205  Gross per 24 hour  Intake  967.5 ml  Output   1675 ml  Net -707.5 ml   General exam: Pleasant and comfortable. Respiratory system: Clear. No increased work of breathing Cardiovascular system: First and second heart sounds heard, regular. No JVD or murmurs. Gastrointestinal system: Abdomen is nondistended, soft and normal bowel sounds heard. Central nervous system: Alert and oriented. No focal neurological deficits. Extremities: Bilateral pitting 1+ pitting edema. Small area of redness and medial aspect of the bilateral upper thighs right greater than the left with mild warmth but no other acute findings.  Lab Results: Basic Metabolic Panel:  Basename 03/29/11 1838  NA 133*  K 4.1  CL 94*  CO2 28  GLUCOSE 174*  BUN 14  CREATININE 0.50  CALCIUM 9.7  MG --  PHOS --   Liver Function Tests: No results found for this basename: AST:2,ALT:2,ALKPHOS:2,BILITOT:2,PROT:2,ALBUMIN:2 in the last 72 hours No results found for this basename: LIPASE:2,AMYLASE:2 in the last 72 hours No results found for this basename: AMMONIA:2 in the last 72 hours CBC:  Basename 03/29/11 1838  WBC 22.5*  NEUTROABS --  HGB 7.0*    HCT 20.2*  MCV 107.4*  PLT 634*   Cardiac Enzymes:  Basename 03/30/11 0048 03/29/11 1838  CKTOTAL 30 28  CKMB 2.2 2.6  CKMBINDEX -- --  TROPONINI <0.30 <0.30   BNP:  Basename 03/29/11 1853  POCBNP 4890.0*   D-Dimer: No results found for this basename: DDIMER:2 in the last 72 hours CBG:  Basename 03/30/11 1448 03/30/11 1206 03/30/11 0813  GLUCAP 167* 197* 168*   Hemoglobin A1C:  Basename 03/30/11 0050  HGBA1C 7.7*   Fasting Lipid Panel: No results found for this basename: CHOL,HDL,LDLCALC,TRIG,CHOLHDL,LDLDIRECT in the last 72 hours Thyroid Function Tests:  Basename 03/30/11 0050  TSH 2.906  T4TOTAL --  FREET4 --  T3FREE --  THYROIDAB --   Anemia Panel:  Basename 03/30/11 0050  VITAMINB12 >2000*  FOLATE >20.0  FERRITIN 442*  TIBC 204*  IRON 64  RETICCTPCT 2.7   Coagulation: No results found for this basename: LABPROT:2,INR:2 in the last 72 hours Urine Drug Screen: Drugs of Abuse  No results found for this basename: labopia, cocainscrnur, labbenz, amphetmu, thcu, labbarb    Alcohol Level: No results found for this basename: ETH:2 in the last 72 hours Urinalysis:  Misc. Labs:   Micro Results: No results found for this or any previous visit (from the past 240 hour(s)).  Studies/Results: Dg Chest 2 View  03/29/2011  *RADIOLOGY REPORT*  Clinical Data: Cough.  Difficulty breathing.  CHEST - 2 VIEW  Comparison: 03/25/2011  Findings: There is hyperinflation of the lungs compatible with COPD.  There are bilateral pleural effusions and bibasilar atelectasis, similar to prior study.  Heart is mildly enlarged.  No change since prior study.  No acute bony abnormality.  IMPRESSION: COPD.  Small effusions and bibasilar atelectasis.  No change.  Original Report Authenticated By: Cyndie Chime, M.D.   Dg Chest 2 View  03/25/2011  *RADIOLOGY REPORT*  Clinical Data: Coughing and fever.  Shortness of breath.  CHEST - 2 VIEW  Comparison: 10/15/2010.  Findings: The  cardiac silhouette is normal size and shape. Ectasia and nonaneurysmal calcification of the thoracic aorta are seen. There is generalized overall hyperinflation configuration with flattening of diaphragm on lateral image.  Basilar atelectasis and infiltrative densities are seen with associated small pleural effusions more on the left in the right.  There is osteopenic appearance of the bones.  Changes of degenerative disc disease and degenerative spondylosis.  IMPRESSION: COPD configuration.  Basilar atelectasis and infiltrative densities.  Associated small pleural effusions more on the left than the right.  Original Report Authenticated By: Crawford Givens, M.D.    Medications: Scheduled Meds:   . sodium chloride  500 mL Intravenous Once  . acetaminophen  650 mg Oral Once  . azithromycin  500 mg Intravenous Once  . azithromycin  500 mg Intravenous Q24H  . cefTRIAXone (ROCEPHIN)  IV  1 g Intravenous Once  . cefTRIAXone (ROCEPHIN) IVPB 1 gram/50 mL D5W  1 g Intravenous Q24H  . diphenhydrAMINE  25 mg Oral Once  . furosemide  20 mg Intravenous Once  . furosemide  20 mg Intravenous Once  . insulin aspart  0-15 Units Subcutaneous TID WC  . insulin aspart  0-5 Units Subcutaneous QHS  . metoprolol  50 mg Oral BID  . pantoprazole (PROTONIX) IV  40 mg Intravenous Q12H  . simvastatin  5 mg Oral QHS  . vancomycin  750 mg Intravenous Q24H   Continuous Infusions:  PRN Meds:.acetaminophen, acetaminophen, albuterol, alum & mag hydroxide-simeth, guaiFENesin-dextromethorphan, HYDROcodone-acetaminophen, ondansetron (ZOFRAN) IV, ondansetron, zolpidem, DISCONTD: ondansetron  Assessment/Plan: 1. Symptomatic anemia: Question chronic disease. Patient says that she had a colonoscopy in April of 2012 which she says did not show anything abnormal. Patient is status post 2 units of packed red blood cell transfusions. We'll follow posttransfusion CBCs. Gastroenterology has been consulted for further evaluation. Patient  does not recall having the EGD. 2. Lower extremity edema complicated by bilateral medial upper thigh cellulitis. Continue intravenous antibiotics. Leg edema may be secondary to acute on chronic diastolic congestive heart failure. Minimize fluids. Check 2-D echocardiogram. Consider diuretics. 3. Pneumonia: Clinically improving. 4. Type 2 diabetes mellitus: Continue sliding scale insulin. 5. Hypertension: Continue metoprolol 6. Leukocytosis: Secondary to ongoing infection. Follow daily CBCs.  Discussed patient's care at length with her extended family including sisters and daughters.   Keara Pagliarulo 03/30/2011, 5:32 PM

## 2011-03-30 NOTE — Consult Note (Signed)
Referring Provider: Dr. Waymon Amato Primary Care Physician:  [name], MD Primary Gastroenterologist:  Dr. Randa Evens  Reason for Consultation:  Anemia  HPI: [name] is a 77 y.o. female who was admitted yesterday because of lower extremity edema and cellulitis and was found to have a Hgb 7.0. She denies a previous history of anemia but was scheduled to see Dr. Evette Cristal (for Dr. Randa Evens) on 04/01/11 for this anemia. She denies any abdominal pain, N/V/heartburn/melena/hematochezia/hematemesis. Denies NSAIDs. Normal colonoscopy in 07/2010 that was done due to a family history of colon cancer (sister). Iron studies reveal normal ferritin, iron sat and iron level. WBC 22.5. Hgb 7.0.  Past Medical History  Diagnosis Date  . Hypertension   . Diabetes mellitus   . Hyperkalemia   . IBS (irritable bowel syndrome)   . Hypercholesterolemia   . Cervical radiculopathy     Past Surgical History  Procedure Date  . Varicose veins     surgey 1959  . Tonsilectomy, adenoidectomy, bilateral myringotomy and tubes     Prior to Admission medications   Medication Sig Start Date End Date Taking? Authorizing Provider  amLODipine (NORVASC) 5 MG tablet Take 1 tablet (5 mg total) by mouth daily. 09/11/10  Yes Cassell Clement, MD  Ascorbic Acid (VITAMIN C) 500 MG tablet Take 500 mg by mouth daily.     Yes Historical Provider, MD  aspirin 81 MG tablet Take 81 mg by mouth daily.     Yes Historical Provider, MD  levofloxacin (LEVAQUIN) 500 MG tablet Take 1 tablet (500 mg total) by mouth daily. 03/22/11 04/01/11 Yes Bruce W Burchette  metoprolol (LOPRESSOR) 50 MG tablet Take 50 mg by mouth 2 (two) times daily.     Yes Historical Provider, MD  mometasone (ELOCON) 0.1 % lotion Apply 1 application topically daily.   12/27/10 12/27/11 Yes Bruce W Burchette  MULTIPLE VITAMIN PO Take 1 tablet by mouth daily.    Yes Historical Provider, MD  Omega-3 Fatty Acids (FISH OIL) 1000 MG CAPS Take 1,000 mg by mouth daily.    Yes  Historical Provider, MD  ondansetron (ZOFRAN) 4 MG tablet Take 1 tablet (4 mg total) by mouth every 8 (eight) hours as needed for nausea. 03/22/11 03/29/11 Yes Bruce W Burchette  simvastatin (ZOCOR) 5 MG tablet Take 1 tablet (5 mg total) by mouth at bedtime. 03/29/11  Yes Cassell Clement, MD  traMADol (ULTRAM) 50 MG tablet Take 50 mg by mouth every 6 (six) hours as needed. For pain. 09/12/10  Yes Historical Provider, MD    Current Facility-Administered Medications  Medication Dose Route Frequency Provider Last Rate Last Dose  . 0.9 %  sodium chloride infusion  500 mL Intravenous Once Anastassia Doutova 20 mL/hr at 03/30/11 0253 500 mL at 03/30/11 0253  . acetaminophen (TYLENOL) tablet 650 mg  650 mg Oral Q6H PRN Anastassia Doutova       Or  . acetaminophen (TYLENOL) suppository 650 mg  650 mg Rectal Q6H PRN Anastassia Doutova      . acetaminophen (TYLENOL) tablet 650 mg  650 mg Oral Once Anastassia Doutova   650 mg at 03/30/11 0252  . albuterol (PROVENTIL) (5 MG/ML) 0.5% nebulizer solution 2.5 mg  2.5 mg Nebulization Q2H PRN Anastassia Doutova      . alum & mag hydroxide-simeth (MAALOX/MYLANTA) 200-200-20 MG/5ML suspension 30 mL  30 mL Oral Q6H PRN Anastassia Doutova      . azithromycin (ZITHROMAX) 500 mg in dextrose 5 % 250 mL IVPB  500 mg Intravenous  Once [employer]. Tucich, MD 250 mL/hr at 03/29/11 2108 500 mg at 03/29/11 2108  . azithromycin (ZITHROMAX) 500 mg in dextrose 5 % 250 mL IVPB  500 mg Intravenous Q24H Anastassia Doutova      . cefTRIAXone (ROCEPHIN) 1 g in dextrose 5 % 50 mL IVPB  1 g Intravenous Once Peter A. Patrica Duel, MD   1 g at 03/29/11 2009  . cefTRIAXone (ROCEPHIN) 1 g in dextrose 5 % 50 mL IVPB  1 g Intravenous Q24H Anastassia Doutova      . diphenhydrAMINE (BENADRYL) capsule 25 mg  25 mg Oral Once Anastassia Doutova   25 mg at 03/30/11 0252  . furosemide (LASIX) injection 20 mg  20 mg Intravenous Once Peter A. Tucich, MD   20 mg at 03/29/11 2009  . furosemide (LASIX) injection  20 mg  20 mg Intravenous Once Anastassia Doutova   20 mg at 03/30/11 3086  . furosemide (LASIX) tablet 40 mg  40 mg Oral Daily Anand Hongalgi      . guaiFENesin-dextromethorphan (ROBITUSSIN DM) 100-10 MG/5ML syrup 5 mL  5 mL Oral Q4H PRN Anastassia Doutova      . HYDROcodone-acetaminophen (NORCO) 5-325 MG per tablet 1-2 tablet  1-2 tablet Oral Q4H PRN Anastassia Doutova      . insulin aspart (novoLOG) injection 0-9 Units  0-9 Units Subcutaneous TID WC Anand Hongalgi      . metoprolol (LOPRESSOR) tablet 50 mg  50 mg Oral BID Anastassia Doutova   50 mg at 03/30/11 1121  . ondansetron (ZOFRAN) tablet 4 mg  4 mg Oral Q6H PRN Anastassia Doutova       Or  . ondansetron (ZOFRAN) injection 4 mg  4 mg Intravenous Q6H PRN Anastassia Doutova      . pantoprazole (PROTONIX) injection 40 mg  40 mg Intravenous Q12H Anastassia Doutova   40 mg at 03/30/11 1331  . simvastatin (ZOCOR) tablet 5 mg  5 mg Oral QHS Anastassia Doutova      . vancomycin (VANCOCIN) 750 mg in sodium chloride 0.9 % 150 mL IVPB  750 mg Intravenous Q24H Kimberly Ballard Hammons, PHARMD   750 mg at 03/30/11 0755  . zolpidem (AMBIEN) tablet 5 mg  5 mg Oral QHS PRN Anastassia Doutova      . DISCONTD: insulin aspart (novoLOG) injection 0-15 Units  0-15 Units Subcutaneous TID WC Anastassia Doutova   2 Units at 03/30/11 1831  . DISCONTD: insulin aspart (novoLOG) injection 0-5 Units  0-5 Units Subcutaneous QHS Anastassia Doutova      . DISCONTD: ondansetron (ZOFRAN) tablet 4 mg  4 mg Oral Q8H PRN Anastassia Doutova        Allergies as of 03/29/2011 - Review Complete 03/29/2011  Allergen Reaction Noted  . Amoxicillin (amoxil)  05/17/2010  . Cephalexin Nausea Only 05/17/2010  . Metformin and related  05/17/2010  . Norvasc (amlodipine besylate)  05/17/2010  . Phenergan (promethazine hcl)  05/17/2010  . Procardia (nifedipine)  05/17/2010    Family History  Problem Relation Age of Onset  . Stroke Mother   . Hypertension Father   . Cancer  Sister   . Cancer Sister   . Diabetes Sister   . Cancer Sister     colon cancer  . Cancer Daughter     breast cancer    History   Social History  . Marital Status: Married    Spouse Name: N/A    Number of Children: N/A  . Years of Education: N/A   Occupational  History  . Not on file.   Social History Main Topics  . Smoking status: Never Smoker   . Smokeless tobacco: Not on file  . Alcohol Use: No  . Drug Use: No  . Sexually Active: No   Other Topics Concern  . Not on file   Social History Narrative  . No narrative on file    Review of Systems: All negative except as stated above in HPI.  Physical Exam: Vital signs: Filed Vitals:   03/30/11 1334  BP: 157/74  Pulse: 86  Temp: 98.5 F (36.9 C)  Resp: 18   Last BM Date: 03/29/11 General:   Alert,  Elderly, thin, pleasant and cooperative in NAD Lungs:  Clear throughout to auscultation.   No wheezes, crackles, or rhonchi. No acute distress. Heart:  Regular rate and rhythm; no murmurs, clicks, rubs,  or gallops. Abdomen: soft, NT, ND, +BS Rectal:  Deferred  GI:  Lab Results:  Basename 03/29/11 1838  WBC 22.5*  HGB 7.0*  HCT 20.2*  PLT 634*   BMET  Basename 03/29/11 1838  NA 133*  K 4.1  CL 94*  CO2 28  GLUCOSE 174*  BUN 14  CREATININE 0.50  CALCIUM 9.7   LFT No results found for this basename: PROT,ALBUMIN,AST,ALT,ALKPHOS,BILITOT,BILIDIR,IBILI in the last 72 hours PT/INR No results found for this basename: LABPROT:2,INR:2 in the last 72 hours   Studies/Results: Dg Chest 2 View  03/29/2011  *RADIOLOGY REPORT*  Clinical Data: Cough.  Difficulty breathing.  CHEST - 2 VIEW  Comparison: 03/25/2011  Findings: There is hyperinflation of the lungs compatible with COPD.  There are bilateral pleural effusions and bibasilar atelectasis, similar to prior study.  Heart is mildly enlarged.  No change since prior study.  No acute bony abnormality.  IMPRESSION: COPD.  Small effusions and bibasilar  atelectasis.  No change.  Original Report Authenticated By: Cyndie Chime, M.D.    Impression: 77yo WF with severe macrocytic anemia without any overt bleeding. Will consider an EGD when CHF stable. No indication for a repeat colonoscopy. Likely will need hematology workup if EGD unrevealing. Would not recommend a capsule endoscopy based on this presentation if EGD unrevealing. Plan: NPO p MN on Sunday night for possible EGD on Monday 04/01/11. Supportive care. Diuresis per primary team.   LOS: 1 day   Simranjit Thayer C.  03/30/2011, 6:41 PM

## 2011-03-31 DIAGNOSIS — I059 Rheumatic mitral valve disease, unspecified: Secondary | ICD-10-CM

## 2011-03-31 LAB — BASIC METABOLIC PANEL
Calcium: 8.8 mg/dL (ref 8.4–10.5)
Chloride: 96 mEq/L (ref 96–112)
Creatinine, Ser: 0.63 mg/dL (ref 0.50–1.10)
GFR calc Af Amer: >90 mL/min (ref >90)
GFR calc non Af Amer: 81 mL/min — ABNORMAL LOW (ref >90)

## 2011-03-31 LAB — CBC
MCH: 33.9 pg (ref 26.0–34.0)
MCHC: 34.5 g/dL (ref 30.0–36.0)
MCV: 98.1 fL (ref 78.0–100.0)
Platelets: 544 10*3/uL — ABNORMAL HIGH (ref 150–400)
RDW: 21.3 % — ABNORMAL HIGH (ref 11.5–15.5)
WBC: 13.3 10*3/uL — ABNORMAL HIGH (ref 4.0–10.5)

## 2011-03-31 LAB — TYPE AND SCREEN: Unit division: 0

## 2011-03-31 NOTE — Progress Notes (Signed)
  Echocardiogram 2D Echocardiogram has been performed.  Juanita Laster Yarnell Kozloski, RDCS 03/31/2011, 3:54 PM

## 2011-03-31 NOTE — Progress Notes (Signed)
Subjective: Overall says she's feeling better. She gives history of orthopnea at home and leg swelling. Pain and redness between her thighs is decreasing. She feels stronger. She had a normal colored stool. No nausea or vomiting.  Objective: Blood pressure 150/71, pulse 87, temperature 98.8 F (37.1 C), temperature source Oral, resp. rate 18, height 5\' 4"  (1.626 m), weight 46.1 kg (101 lb 10.1 oz), SpO2 100.00%.  Intake/Output Summary (Last 24 hours) at 03/31/11 1600 Last data filed at 03/31/11 1300  Gross per 24 hour  Intake    240 ml  Output      0 ml  Net    240 ml   General exam: Looks improved and comfortable. Pleasant. Respiratory system: Slightly reduced breath sounds in the bases. Rest of the lung fields are clear. No increased work of breathing. Cardiovascular system: First and second heart sounds heard, regular. No JVD or murmurs. Gastrointestinal system: Abdomen is nondistended, soft and normal bowel sounds heard. Central nervous system: Alert and oriented. No focal neurological deficits. Extremities: Bilateral pitting 1+ pitting edema. Small area of redness and medial aspect of the bilateral upper thighs right greater than the left with mild warmth but no other acute findings. This is improving compared to yesterday.  Lab Results: Basic Metabolic Panel:  Basename 03/31/11 0745 03/30/11 1845  NA 133* 137  K 4.4 3.7  CL 96 97  CO2 31 31  GLUCOSE 180* 141*  BUN 17 17  CREATININE 0.63 0.67  CALCIUM 8.8 9.3  MG -- --  PHOS -- --   Liver Function Tests: No results found for this basename: AST:2,ALT:2,ALKPHOS:2,BILITOT:2,PROT:2,ALBUMIN:2 in the last 72 hours No results found for this basename: LIPASE:2,AMYLASE:2 in the last 72 hours No results found for this basename: AMMONIA:2 in the last 72 hours CBC:  Basename 03/31/11 0745 03/30/11 1845  WBC 13.3* 16.6*  NEUTROABS -- 14.7*  HGB 10.5* 11.1*  HCT 30.4* 32.4*  MCV 98.1 97.6  PLT 544* 598*   Cardiac  Enzymes:  Basename 03/30/11 0048 03/29/11 1838  CKTOTAL 30 28  CKMB 2.2 2.6  CKMBINDEX -- --  TROPONINI <0.30 <0.30   BNP:  Basename 03/29/11 1853  POCBNP 4890.0*   D-Dimer: No results found for this basename: DDIMER:2 in the last 72 hours CBG:  Basename 03/31/11 1226 03/31/11 0822 03/30/11 2233 03/30/11 1723 03/30/11 1448 03/30/11 1206  GLUCAP 175* 164* 164* 136* 167* 197*   Hemoglobin A1C:  Basename 03/30/11 0050  HGBA1C 7.7*   Fasting Lipid Panel: No results found for this basename: CHOL,HDL,LDLCALC,TRIG,CHOLHDL,LDLDIRECT in the last 72 hours Thyroid Function Tests:  Basename 03/30/11 0050  TSH 2.906  T4TOTAL --  FREET4 --  T3FREE --  THYROIDAB --   Anemia Panel:  Basename 03/30/11 0050  VITAMINB12 >2000*  FOLATE >20.0  FERRITIN 442*  TIBC 204*  IRON 64  RETICCTPCT 2.7   Coagulation: No results found for this basename: LABPROT:2,INR:2 in the last 72 hours Urine Drug Screen: Drugs of Abuse  No results found for this basename: labopia,  cocainscrnur,  labbenz,  amphetmu,  thcu,  labbarb    Alcohol Level: No results found for this basename: ETH:2 in the last 72 hours Urinalysis:  Misc. Labs:   Micro Results: No results found for this or any previous visit (from the past 240 hour(s)).  Studies/Results: Dg Chest 2 View  03/29/2011  *RADIOLOGY REPORT*  Clinical Data: Cough.  Difficulty breathing.  CHEST - 2 VIEW  Comparison: 03/25/2011  Findings: There is hyperinflation of the lungs  compatible with COPD.  There are bilateral pleural effusions and bibasilar atelectasis, similar to prior study.  Heart is mildly enlarged.  No change since prior study.  No acute bony abnormality.  IMPRESSION: COPD.  Small effusions and bibasilar atelectasis.  No change.  Original Report Authenticated By: Cyndie Chime, M.D.   Dg Chest 2 View  03/25/2011  *RADIOLOGY REPORT*  Clinical Data: Coughing and fever.  Shortness of breath.  CHEST - 2 VIEW  Comparison: 10/15/2010.   Findings: The cardiac silhouette is normal size and shape. Ectasia and nonaneurysmal calcification of the thoracic aorta are seen. There is generalized overall hyperinflation configuration with flattening of diaphragm on lateral image.  Basilar atelectasis and infiltrative densities are seen with associated small pleural effusions more on the left in the right.  There is osteopenic appearance of the bones.  Changes of degenerative disc disease and degenerative spondylosis.  IMPRESSION: COPD configuration.  Basilar atelectasis and infiltrative densities.  Associated small pleural effusions more on the left than the right.  Original Report Authenticated By: Crawford Givens, M.D.    Medications: Scheduled Meds:    . azithromycin  500 mg Intravenous Q24H  . cefTRIAXone (ROCEPHIN) IVPB 1 gram/50 mL D5W  1 g Intravenous Q24H  . furosemide  40 mg Oral Daily  . insulin aspart  0-9 Units Subcutaneous TID WC  . metoprolol  50 mg Oral BID  . pantoprazole (PROTONIX) IV  40 mg Intravenous Q12H  . simvastatin  5 mg Oral QHS  . vancomycin  750 mg Intravenous Q24H  . DISCONTD: insulin aspart  0-15 Units Subcutaneous TID WC  . DISCONTD: insulin aspart  0-5 Units Subcutaneous QHS   Continuous Infusions:  PRN Meds:.acetaminophen, acetaminophen, albuterol, alum & mag hydroxide-simeth, guaiFENesin-dextromethorphan, HYDROcodone-acetaminophen, ondansetron (ZOFRAN) IV, ondansetron, zolpidem  Assessment/Plan: 1. Symptomatic anemia: Question chronic disease. No peripheral smear was sent prior to transfusion. This is improved status post 2 units of packed red blood cells. Consider outpatient hematology consultation. GI consultation appreciated. Plan for EGD tomorrow. 2. Lower extremity edema complicated by bilateral medial upper thigh cellulitis. Continue IV vancomycin. This is improving. Consider switching to oral antibiotics tomorrow. 3. Possible acute on chronic diastolic congestive heart failure. Minimize fluids. Follow  2-D echocardiogram results. Lasix was started today. 4. Pneumonia: Clinically improving. Continue IV Rocephin and Zithromax. Follow chest x-ray tomorrow. 5. Type 2 diabetes mellitus: Continue sliding scale insulin. 6. Hypertension: Continue metoprolol    Meredith Rodriguez 03/31/2011, 4:00 PM

## 2011-03-31 NOTE — Progress Notes (Signed)
Contacted [place] PA with post transfusion lab results.  Notes Hgb elevated to 11.1.  No new orders given.  Will continue to monitor.

## 2011-04-01 ENCOUNTER — Inpatient Hospital Stay (HOSPITAL_COMMUNITY): Payer: Medicare Other

## 2011-04-01 ENCOUNTER — Ambulatory Visit: Payer: Medicare Other | Admitting: Family Medicine

## 2011-04-01 ENCOUNTER — Encounter (HOSPITAL_COMMUNITY): Payer: Self-pay | Admitting: Gastroenterology

## 2011-04-01 ENCOUNTER — Encounter (HOSPITAL_COMMUNITY)
Admission: EM | Disposition: A | Discharge status: Home / Self Care | Payer: Self-pay | Source: Home / Self Care | Attending: Internal Medicine

## 2011-04-01 HISTORY — PX: ESOPHAGOGASTRODUODENOSCOPY: SHX5428

## 2011-04-01 LAB — BASIC METABOLIC PANEL
BUN: 19 mg/dL (ref 6–23)
CO2: 32 mEq/L (ref 19–32)
Calcium: 9.2 mg/dL (ref 8.4–10.5)
Chloride: 94 mEq/L — ABNORMAL LOW (ref 96–112)
Creatinine, Ser: 0.65 mg/dL (ref 0.50–1.10)
Glucose, Bld: 157 mg/dL — ABNORMAL HIGH (ref 70–99)

## 2011-04-01 LAB — CBC
HCT: 31 % — ABNORMAL LOW (ref 36.0–46.0)
MCH: 34 pg (ref 26.0–34.0)
MCV: 99.4 fL (ref 78.0–100.0)
Platelets: 609 10*3/uL — ABNORMAL HIGH (ref 150–400)
RDW: 19.8 % — ABNORMAL HIGH (ref 11.5–15.5)

## 2011-04-01 LAB — GLUCOSE, CAPILLARY: Glucose-Capillary: 217 mg/dL — ABNORMAL HIGH (ref 70–99)

## 2011-04-01 SURGERY — EGD (ESOPHAGOGASTRODUODENOSCOPY)
Anesthesia: Moderate Sedation

## 2011-04-01 MED ORDER — MIDAZOLAM HCL 10 MG/2ML IJ SOLN
INTRAMUSCULAR | Status: DC | PRN
  Administered 2011-04-01 (×2): 2 mg via INTRAVENOUS

## 2011-04-01 MED ORDER — HYDRALAZINE HCL 20 MG/ML IJ SOLN
10.0000 mg | Freq: Four times a day (QID) | INTRAMUSCULAR | Status: DC | PRN
  Filled 2011-04-01: qty 0.5

## 2011-04-01 MED ORDER — DOXYCYCLINE HYCLATE 100 MG PO TABS
100.0000 mg | ORAL_TABLET | Freq: Two times a day (BID) | ORAL | Status: DC
  Administered 2011-04-01 – 2011-04-02 (×2): 100 mg via ORAL
  Filled 2011-04-01 (×3): qty 1

## 2011-04-01 MED ORDER — FENTANYL CITRATE 0.05 MG/ML IJ SOLN
INTRAMUSCULAR | Status: AC
  Filled 2011-04-01: qty 2

## 2011-04-01 MED ORDER — FENTANYL NICU IV SYRINGE 50 MCG/ML
INJECTION | INTRAMUSCULAR | Status: DC | PRN
  Administered 2011-04-01 (×2): 20 ug via INTRAVENOUS

## 2011-04-01 MED ORDER — BUTAMBEN-TETRACAINE-BENZOCAINE 2-2-14 % EX AERO
INHALATION_SPRAY | CUTANEOUS | Status: DC | PRN
  Administered 2011-04-01: 2 via TOPICAL

## 2011-04-01 MED ORDER — MOXIFLOXACIN HCL 400 MG PO TABS
400.0000 mg | ORAL_TABLET | Freq: Every day | ORAL | Status: DC
  Filled 2011-04-01: qty 1

## 2011-04-01 MED ORDER — MIDAZOLAM HCL 10 MG/2ML IJ SOLN
INTRAMUSCULAR | Status: AC
  Filled 2011-04-01: qty 2

## 2011-04-01 MED ORDER — SODIUM CHLORIDE 0.9 % IV SOLN
INTRAVENOUS | Status: DC
  Administered 2011-04-01: 500 mL via INTRAVENOUS

## 2011-04-01 NOTE — Progress Notes (Signed)
Physical Therapy Evaluation Patient Details Name: Meredith Rodriguez MRN: [id number] DOB: 20-Apr-1926 Today's Date: 04/01/2011  Problem List:  Patient Active Problem List  Diagnoses  . DIABETES MELLITUS II, UNCOMPLICATED  . HYPERLIPIDEMIA  . HYPONATREMIA  . ANXIETY  . NEUROPATHY, PERIPHERAL  . HYPERTENSION, BENIGN SYSTEMIC  . HYPERTENSION  . EXTERNAL HEMORRHOIDS WITHOUT MENTION COMP  . VAGINITIS, ATROPHIC  . Diabetes mellitus  . Hyperkalemia  . IBS (irritable bowel syndrome)  . Hypercholesterolemia  . CERUMEN IMPACTION  . Anemia  . Edema  . Cellulitis of leg  . Leukocytosis  . Pneumonia    Past Medical History:  Past Medical History  Diagnosis Date  . Hypertension   . Diabetes mellitus   . Hyperkalemia   . IBS (irritable bowel syndrome)   . Hypercholesterolemia   . Cervical radiculopathy    Past Surgical History:  Past Surgical History  Procedure Date  . Varicose veins     surgey 1959  . Tonsilectomy, adenoidectomy, bilateral myringotomy and tubes     PT Assessment/Plan/Recommendation PT Assessment Clinical Impression Statement: Pt very pleasant and eager to return to her daily routine. Pt ambulating and performing transfers and gross balance assessment without difficulty. Pt and spouse agreeable to no further needs. Pt encouraged to continue ambulation. PT Recommendation/Assessment: Patent does not need any further PT services No Skilled PT: Patient at baseline level of functioning PT Recommendation Follow Up Recommendations: None Pt lives with spouse who is available 24 hours as needed. PT Goals     PT Evaluation Precautions/Restrictions    Prior Functioning  Home Living Lives With: Spouse Type of Home: House Home Layout: One level Home Access: Stairs to enter [employer] of Steps: 1 Bathroom Shower/Tub: Forensic scientist: Standard Home Adaptive Equipment: None Prior Function Level of Independence: Independent with basic  ADLs;Independent with transfers;Independent with homemaking with ambulation;Independent with gait Driving: Yes Vocation: Retired Producer, television/film/video: Awake/alert Overall Cognitive Status: Appears within functional limits for tasks assessed Orientation Level: Oriented X4 Sensation/Coordination   Extremity Assessment RUE Assessment RUE Assessment: Within Functional Limits LUE Assessment LUE Assessment: Within Functional Limits RLE Assessment RLE Assessment: Within Functional Limits LLE Assessment LLE Assessment: Within Functional Limits Mobility (including Balance) Bed Mobility Bed Mobility: Yes Supine to Sit: 7: Independent Transfers Transfers: Yes Sit to Stand: 7: Independent;From bed;From toilet Stand to Sit: 7: Independent;To toilet Ambulation/Gait Ambulation/Gait: Yes Ambulation/Gait Assistance: 7: Independent Ambulation Distance (Feet): 600 Feet Assistive device: None Gait Pattern: Within Functional Limits Stairs: Yes Stairs Assistance: 6: Modified independent (Device/Increase time) Stair Management Technique: One rail Right Number of Stairs: 5  Height of Stairs: 8  Wheelchair Mobility Wheelchair Mobility: No  Posture/Postural Control Posture/Postural Control: No significant limitations Balance Balance Assessed: Yes Dynamic Standing Balance Dynamic Standing - Balance Activities:  (Pt turned 360 degrees, picked object off floor and SLS 6 sec) Exercise    End of Session PT - End of Session Activity Tolerance: Patient tolerated treatment well Patient left: in bed;with call bell in reach;with family/visitor present General Behavior During Session: [facility] for tasks performed Cognition: [facility] for tasks performed  Delorse Lek 04/01/2011, 5:07 PM  Toney Sang, PT [phone]

## 2011-04-01 NOTE — Progress Notes (Signed)
Patient interviewed and examined. Discussed with Ms. York, PA and agree with her notes.  Patient denies dyspnea or cough. She is lying comfortably supine and in no obvious distress.  Medial thighs look significantly improved and just a trace of redness but no tenderness or warmth. Edema seems to be decreasing. Although the right knee is asymmetrically swollen compared to the left she has no pain and there are no acute findings.  Anemia: Unclear etiology. Status post 2 units of packed red blood cell transfusion. EGD this admission was negative an outpatient recent colonoscopy was negative. Recommend outpatient hematology consultation.  Acute on chronic diastolic congestive heart failure: 2-D echo shows left rectal ejection fraction of 65%. Improving.  Pneumonia: Resolved. Discontinue IV Rocephin and Zithromax  Bilateral medial thigh cellulitis: Improving. Discontinue vancomycin and change to oral doxycycline  Discussed patient's care at length with her as well as her spouse was at the bedside. Answered all their questions.  Disposition: Possible discharge tomorrow.

## 2011-04-01 NOTE — Progress Notes (Signed)
[facility] Progress Note  Subjective: Patient complaints of neuropathy pain or lower extremities no GI complaints.  Objective: Vital signs in last 24 hours: Temp:  [98.2 F (36.8 C)-99.3 F (37.4 C)] 99.3 F (37.4 C) (12/03 0937) Pulse Rate:  [87-95] 94  (12/03 0546) Resp:  [18-21] 21  (12/03 0937) BP: (150-184)/(67-79) 181/79 mmHg (12/03 0937) SpO2:  [95 %-100 %] 95 % (12/03 0546) Weight:  [49.896 kg (110 lb)] 110 lb (49.896 kg) (12/03 0937) Weight change:    PE: No change.  Lab Results: Results for orders placed during the hospital encounter of 03/29/11 (from the past 24 hour(s))  GLUCOSE, CAPILLARY     Status: Abnormal   Collection Time   03/31/11 12:26 PM      Component Value Range   Glucose-Capillary 175 (*) 70 - 99 (mg/dL)  GLUCOSE, CAPILLARY     Status: Abnormal   Collection Time   03/31/11  5:25 PM      Component Value Range   Glucose-Capillary 169 (*) 70 - 99 (mg/dL)  GLUCOSE, CAPILLARY     Status: Abnormal   Collection Time   03/31/11 10:45 PM      Component Value Range   Glucose-Capillary 205 (*) 70 - 99 (mg/dL)   Comment 1 Notify RN    BASIC METABOLIC PANEL     Status: Abnormal   Collection Time   04/01/11  7:00 AM      Component Value Range   Sodium 134 (*) 135 - 145 (mEq/L)   Potassium 3.8  3.5 - 5.1 (mEq/L)   Chloride 94 (*) 96 - 112 (mEq/L)   CO2 32  19 - 32 (mEq/L)   Glucose, Bld 157 (*) 70 - 99 (mg/dL)   BUN 19  6 - 23 (mg/dL)   Creatinine, Ser 4.09  0.50 - 1.10 (mg/dL)   Calcium 9.2  8.4 - 81.1 (mg/dL)   GFR calc non Af Amer 80 (*) >90 (mL/min)   GFR calc Af Amer >90  >90 (mL/min)  CBC     Status: Abnormal   Collection Time   04/01/11  7:00 AM      Component Value Range   WBC 12.5 (*) 4.0 - 10.5 (K/uL)   RBC 3.12 (*) 3.87 - 5.11 (MIL/uL)   Hemoglobin 10.6 (*) 12.0 - 15.0 (g/dL)   HCT 91.4 (*) 78.2 - 46.0 (%)   MCV 99.4  78.0 - 100.0 (fL)   MCH 34.0  26.0 - 34.0 (pg)   MCHC 34.2  30.0 - 36.0 (g/dL)   RDW 95.6 (*) 21.3 - 15.5 (%)    Platelets 609 (*) 150 - 400 (K/uL)  GLUCOSE, CAPILLARY     Status: Abnormal   Collection Time   04/01/11  8:06 AM      Component Value Range   Glucose-Capillary 146 (*) 70 - 99 (mg/dL)    Studies/Results: EGD: Completely normal.   Assessment: Anemia, no evidence of GI blood loss by colonoscopy or EGD.  Plan: No further workup for anemia unless confirmed GI blood loss. Will sign off for now.    Hanaa Payes C 04/01/2011, 10:27 AM

## 2011-04-01 NOTE — Progress Notes (Signed)
Patient ID: Meredith Rodriguez, female   DOB: 1925/08/29, 77 y.o.   MRN: [id number] Subjective: Patient sleepy but has lots of questions, Ex. Where did my cellulitis come from? will the swelling in my knee go down?  Patient complains of neuropathic pain in her feet but she's afraid to take medications for it because she has difficulty with balance and is worried they will make her fall.  Objective: Weight change:   Intake/Output Summary (Last 24 hours) at 04/01/11 1548 Last data filed at 03/31/11 2317  Gross per 24 hour  Intake    420 ml  Output      0 ml  Net    420 ml   Blood pressure 165/68, pulse 86, temperature 97.3 F (36.3 C), temperature source Axillary, resp. rate 20, height 5\' 5"  (1.651 m), weight 49.896 kg (110 lb), SpO2 93.00%. Filed Vitals:   04/01/11 1040 04/01/11 1050 04/01/11 1100 04/01/11 1210  BP: 139/52 129/54 139/52 165/68  Pulse:    86  Temp:    97.3 F (36.3 C)  TempSrc:    Axillary  Resp: 19 20 20 20   Height:      Weight:      SpO2: 93% 94% 94% 93%   Telemetry:  Pulse 88 in 1st degree block  Physical Exam: General: No acute distress.  Sleeping, awakens easily to my voice. Lungs: Clear to auscultation bilaterally without wheezes or crackles Cardiovascular: Regular rate and rhythm without murmur gallop or rub normal S1 and S2 Abdomen: slightly tender to palpation. nondistended, soft, bowel sounds positive, no rebound, no ascites, no appreciable mass Extremities: Right inner thigh is light pink in color, Rt knee substantially swollen compared to left.  Basic Metabolic Panel:  Lab 04/01/11 9528 03/31/11 0745  NA 134* 133*  K 3.8 4.4  CL 94* 96  CO2 32 31  GLUCOSE 157* 180*  BUN 19 17  CREATININE 0.65 0.63  CALCIUM 9.2 8.8  MG -- --  PHOS -- --   CBC:  Lab 04/01/11 0700 03/31/11 0745 03/30/11 1845  WBC 12.5* 13.3* --  NEUTROABS -- -- 14.7*  HGB 10.6* 10.5* --  HCT 31.0* 30.4* --  MCV 99.4 98.1 --  PLT 609* 544* --   Cardiac Enzymes:  Lab 03/30/11  0048 03/29/11 1838  CKTOTAL 30 28  CKMB 2.2 2.6  CKMBINDEX -- --  TROPONINI <0.30 <0.30   BNP:  Lab 03/29/11 1853  POCBNP 4890.0*   D-Dimer: No results found for this basename: DDIMER:2 in the last 168 hours CBG:  Lab 04/01/11 1201 04/01/11 0806 03/31/11 2245 03/31/11 1725 03/31/11 1226 03/31/11 0822  GLUCAP 136* 146* 205* 169* 175* 164*   Hemoglobin A1C:  Lab 03/30/11 0050  HGBA1C 7.7*   Thyroid Function Tests:  Lab 03/30/11 0050  TSH 2.906  T4TOTAL --  FREET4 --  T3FREE --  THYROIDAB --   Anemia Panel:  Lab 03/30/11 0050  VITAMINB12 >2000*  FOLATE >20.0  FERRITIN 442*  TIBC 204*  IRON 64  RETICCTPCT 2.7   Urine Drug Screen:  Studies/Results:  EGD/COLON completed:  No Source of Anemia Found.    Scheduled Meds:   . furosemide  40 mg Oral Daily  . insulin aspart  0-9 Units Subcutaneous TID WC  . metoprolol  50 mg Oral BID  . moxifloxacin  400 mg Oral q1800  . pantoprazole (PROTONIX) IV  40 mg Intravenous Q12H  . simvastatin  5 mg Oral QHS  . vancomycin  750 mg Intravenous Q24H  .  DISCONTD: azithromycin  500 mg Intravenous Q24H  . DISCONTD: cefTRIAXone (ROCEPHIN) IVPB 1 gram/50 mL D5W  1 g Intravenous Q24H   Continuous Infusions:   . DISCONTD: sodium chloride 200 mL/hr at 04/01/11 1100   PRN Meds:.acetaminophen, acetaminophen, albuterol, alum & mag hydroxide-simeth, guaiFENesin-dextromethorphan, hydrALAZINE, HYDROcodone-acetaminophen, ondansetron (ZOFRAN) IV, ondansetron, zolpidem, DISCONTD: butamben-tetracaine-benzocaine, DISCONTD: fentaNYL, DISCONTD: midazolam  Assessment/Plan: Principal Problem:  *Pneumonia Active Problems:  DIABETES MELLITUS II, UNCOMPLICATED  HYPERTENSION, BENIGN SYSTEMIC  Anemia  Edema  Cellulitis of leg  Leukocytosis  1. Symptomatic anemia: Question chronic disease. No peripheral smear was sent prior to transfusion. This is improved status post 2 units of packed red blood cells. Consider outpatient hematology  consultation. Endoscopic procedures did not reveal source of anemia.  Consider Heme out patient consultation. 2. Lower extremity edema complicated by bilateral medial upper thigh cellulitis. DC Vanc and start PO Doxy 3. Possible acute on chronic diastolic congestive heart failure. Minimize fluids.  Lasix was started inpatient at 40mg  daily.  No 2D Echo results. 4. Pneumonia: Clinically improving. Treated will DC antibiotics. 5. Type 2 diabetes mellitus: Continue sliding scale insulin. 6. Hypertension: Continue metoprolol    LOS: 3 days   YORK,Argie Lober L 04/01/2011, 3:48 PM

## 2011-04-01 NOTE — Progress Notes (Signed)
04/01/2011 Tanicia Wolaver SPARKS Case Management Note [phone]       Utilization review completed.  

## 2011-04-02 LAB — BASIC METABOLIC PANEL
BUN: 17 mg/dL (ref 6–23)
Calcium: 8.9 mg/dL (ref 8.4–10.5)
Chloride: 98 mEq/L (ref 96–112)
Creatinine, Ser: 0.68 mg/dL (ref 0.50–1.10)
GFR calc Af Amer: >90 mL/min (ref >90)

## 2011-04-02 MED ORDER — DOXYCYCLINE HYCLATE 100 MG PO TABS: 100.0000 mg | ORAL_TABLET | Freq: Two times a day (BID) | ORAL | Status: DC

## 2011-04-02 MED ORDER — FUROSEMIDE 40 MG PO TABS: 40.0000 mg | ORAL_TABLET | Freq: Every day | ORAL | Status: DC

## 2011-04-02 NOTE — Discharge Summary (Signed)
Discharge Summary  MASHA ORBACH MR#: [id number]  DOB:05/26/1925  Date of Admission: 03/29/2011 Date of Discharge: 04/02/2011  Patient's PCP: [name], MD  Attending Physician:Quetzalli Clos  Consults: 1. Gastroenterology: Shirley Friar, MD & Barrie Folk, MD   Discharge Diagnoses: 1. Symptomatic anemia. Recommend outpatient evaluation. 2. Bilateral lower extremity cellulitis, improved 3. Pneumonia, treated 4. Hypertension 5. Leg edema, ? Acute heart failure secondary to anemia versus diastolic heart failure 6. Type 2 diabetes mellitus    Brief Admitting History and Physical and hospital course Patient is an 77 year old female with history of hypertension, diet-controlled type 2 diabetes mellitus, hypercholesterolemia who was recently started on Levaquin for presumed pneumonia. She presented to the hospital do to generalized weakness, lower extremity swelling, pain and redness between her thighs, orthopnea. She had recently been seen by her primary care physician and was found to be severely anemic in the 7 g/dL range. She had a recent colonoscopy which was negative. She was supposed to see the gastroenterologist again in a couple of days. Patient was mildly tachycardic but in no obvious distress. There was some diminished breath sounds and features of edema and cellulitis in her thighs. Her admitting hemoglobin was 7, white blood cell 22.5, platelets 634.  Anemia: She did not give any history suggestive of GI bleed. Her anemia panel was suggestive of anemia of chronic disease. Recent colonoscopy was negative. Gastroenterology was consulted and they performed an EGD and did not find a source of bleeding. Patient was transfused 2 units of packed red blood cells with appropriate response. Her hemoglobins were stable. Recommend outpatient hematology consultation for further evaluation.  Bilateral lower extremity cellulitis: Patient was initially empirically treated with IV  vancomycin. Subsequently she was switched to oral doxycycline. This is almost resolved. Will complete a total weeks course of antibiotics.  Pneumonia: Patient had been started on Levaquin on November 23. Initially she was placed on IV Rocephin and Zithromax. Her repeat chest x-ray will had improved and she was also symptomatically better. Her antibiotics for this indication were discontinued. Recommend repeating chest x-ray in a couple of weeks to ensure complete resolution.  Hypertension: Reasonably controlled.  Uncontrolled type 2 diabetes mellitus: Patient is intolerant to metformin. Recommend starting her on some oral medication as an outpatient as deemed necessary, if diet control has not worked adequately.  Acute congestive heart failure: This may have been precipitated by the anemia versus diastolic dysfunction. Clinically patient has improved and is able to lie supine. She is discharged home on Lasix and will need to follow BMP when she sees her M.D. in the next few days. She can followup with Dr. Patty Sermons her primary cardiologist.  Discharge Medications Current Discharge Medication List    START taking these medications   Details  doxycycline (VIBRA-TABS) 100 MG tablet Take 1 tablet (100 mg total) by mouth every 12 (twelve) hours. Qty: 6 tablet, Refills: 0    furosemide (LASIX) 40 MG tablet Take 1 tablet (40 mg total) by mouth daily. Qty: 30 tablet, Refills: 0      CONTINUE these medications which have NOT CHANGED   Details  amLODipine (NORVASC) 5 MG tablet Take 1 tablet (5 mg total) by mouth daily. Qty: 90 tablet, Refills: 3    Ascorbic Acid (VITAMIN C) 500 MG tablet Take 500 mg by mouth daily.      aspirin 81 MG tablet Take 81 mg by mouth daily.      metoprolol (LOPRESSOR) 50 MG tablet Take 50 mg by mouth  2 (two) times daily.      mometasone (ELOCON) 0.1 % lotion Apply 1 application topically daily.      MULTIPLE VITAMIN PO Take 1 tablet by mouth daily.     Omega-3  Fatty Acids (FISH OIL) 1000 MG CAPS Take 1,000 mg by mouth daily.     simvastatin (ZOCOR) 5 MG tablet Take 1 tablet (5 mg total) by mouth at bedtime. Qty: 90 tablet, Refills: 1    traMADol (ULTRAM) 50 MG tablet Take 50 mg by mouth every 6 (six) hours as needed. For pain.      STOP taking these medications     levofloxacin (LEVAQUIN) 500 MG tablet Comments:  Reason for Stopping:       ondansetron (ZOFRAN) 4 MG tablet Comments:  Reason for Stopping:          Day of Discharge BP 128/50  Pulse 95  Temp(Src) 98.1 F (36.7 C) (Oral)  Resp 18  Ht 5\' 5"  (1.651 m)  Wt 49.896 kg (110 lb)  BMI 18.31 kg/m2  SpO2 97%  General exam: Patient is sitting comfortably on the chair after having taken a bath. She is in no obvious distress. Respiratory system: Clear. No increased work of breathing. Cardiovascular system: First and second heart sounds heard, regular. No JVD. Gastrointestinal system: Abdomen is nondistended, soft and normal bowel sounds heard. Central nervous system: Alert and oriented. No focal neurological deficits Extremities: Trace bilateral ankle edema but improving. Right knee mildly asymmetrically swollen compared to the left but no acute findings and is possibly chronic from osteoarthritis. Bilateral medial thigh area significantly improved. There is no warmth or tenderness. There is trace redness on the right side. No open wounds. Patient was examined each day with her female nurse in the room.  Results for orders placed during the hospital encounter of 03/29/11 (from the past 48 hour(s))  GLUCOSE, CAPILLARY     Status: Abnormal   Collection Time   03/31/11  5:25 PM      Component Value Range Comment   Glucose-Capillary 169 (*) 70 - 99 (mg/dL)   GLUCOSE, CAPILLARY     Status: Abnormal   Collection Time   03/31/11 10:45 PM      Component Value Range Comment   Glucose-Capillary 205 (*) 70 - 99 (mg/dL)    Comment 1 Notify RN     BASIC METABOLIC PANEL     Status: Abnormal    Collection Time   04/01/11  7:00 AM      Component Value Range Comment   Sodium 134 (*) 135 - 145 (mEq/L)    Potassium 3.8  3.5 - 5.1 (mEq/L)    Chloride 94 (*) 96 - 112 (mEq/L)    CO2 32  19 - 32 (mEq/L)    Glucose, Bld 157 (*) 70 - 99 (mg/dL)    BUN 19  6 - 23 (mg/dL)    Creatinine, Ser 0.45  0.50 - 1.10 (mg/dL)    Calcium 9.2  8.4 - 10.5 (mg/dL)    GFR calc non Af Amer 80 (*) >90 (mL/min)    GFR calc Af Amer >90  >90 (mL/min)   CBC     Status: Abnormal   Collection Time   04/01/11  7:00 AM      Component Value Range Comment   WBC 12.5 (*) 4.0 - 10.5 (K/uL)    RBC 3.12 (*) 3.87 - 5.11 (MIL/uL)    Hemoglobin 10.6 (*) 12.0 - 15.0 (g/dL)    HCT 40.9 (*)  36.0 - 46.0 (%)    MCV 99.4  78.0 - 100.0 (fL)    MCH 34.0  26.0 - 34.0 (pg)    MCHC 34.2  30.0 - 36.0 (g/dL)    RDW 30.8 (*) 65.7 - 15.5 (%)    Platelets 609 (*) 150 - 400 (K/uL)   GLUCOSE, CAPILLARY     Status: Abnormal   Collection Time   04/01/11  8:06 AM      Component Value Range Comment   Glucose-Capillary 146 (*) 70 - 99 (mg/dL)   GLUCOSE, CAPILLARY     Status: Abnormal   Collection Time   04/01/11 12:01 PM      Component Value Range Comment   Glucose-Capillary 136 (*) 70 - 99 (mg/dL)   GLUCOSE, CAPILLARY     Status: Abnormal   Collection Time   04/01/11  5:33 PM      Component Value Range Comment   Glucose-Capillary 190 (*) 70 - 99 (mg/dL)    Comment 1 Documented in Chart      Comment 2 Notify RN     GLUCOSE, CAPILLARY     Status: Abnormal   Collection Time   04/01/11  9:50 PM      Component Value Range Comment   Glucose-Capillary 217 (*) 70 - 99 (mg/dL)   BASIC METABOLIC PANEL     Status: Abnormal   Collection Time   04/02/11  6:05 AM      Component Value Range Comment   Sodium 138  135 - 145 (mEq/L)    Potassium 3.7  3.5 - 5.1 (mEq/L)    Chloride 98  96 - 112 (mEq/L)    CO2 33 (*) 19 - 32 (mEq/L)    Glucose, Bld 144 (*) 70 - 99 (mg/dL)    BUN 17  6 - 23 (mg/dL)    Creatinine, Ser 8.46  0.50 - 1.10  (mg/dL)    Calcium 8.9  8.4 - 10.5 (mg/dL)    GFR calc non Af Amer 79 (*) >90 (mL/min)    GFR calc Af Amer >90  >90 (mL/min)   GLUCOSE, CAPILLARY     Status: Abnormal   Collection Time   04/02/11  7:54 AM      Component Value Range Comment   Glucose-Capillary 136 (*) 70 - 99 (mg/dL)   GLUCOSE, CAPILLARY     Status: Abnormal   Collection Time   04/02/11 12:09 PM      Component Value Range Comment   Glucose-Capillary 203 (*) 70 - 99 (mg/dL)    pro BNP on admission was 4890   Dg Chest 2 View  04/01/2011  *RADIOLOGY REPORT*  Clinical Data: Follow up pneumonia, cough, weakness  CHEST - 2 VIEW  Comparison: Chest x-ray of 03/29/2011  Findings: There is little change in small pleural effusions.  No pneumonia is seen.  The lungs remain hyperaerated consistent with COPD.  Mediastinal contours are stable.  The heart is stable in size.  The bones are osteopenic.  IMPRESSION: Persistent small pleural effusions.  COPD.  No definite pneumonia.  Original Report Authenticated By: Juline Patch, M.D.   Dg Chest 2 View  03/29/2011  *RADIOLOGY REPORT*  Clinical Data: Cough.  Difficulty breathing.  CHEST - 2 VIEW  Comparison: 03/25/2011  Findings: There is hyperinflation of the lungs compatible with COPD.  There are bilateral pleural effusions and bibasilar atelectasis, similar to prior study.  Heart is mildly enlarged.  No change since prior study.  No acute bony abnormality.  IMPRESSION: COPD.  Small effusions and bibasilar atelectasis.  No change.  Original Report Authenticated By: Cyndie Chime, M.D.   Dg Chest 2 View  03/25/2011  *RADIOLOGY REPORT*  Clinical Data: Coughing and fever.  Shortness of breath.  CHEST - 2 VIEW  Comparison: 10/15/2010.  Findings: The cardiac silhouette is normal size and shape. Ectasia and nonaneurysmal calcification of the thoracic aorta are seen. There is generalized overall hyperinflation configuration with flattening of diaphragm on lateral image.  Basilar atelectasis and  infiltrative densities are seen with associated small pleural effusions more on the left in the right.  There is osteopenic appearance of the bones.  Changes of degenerative disc disease and degenerative spondylosis.  IMPRESSION: COPD configuration.  Basilar atelectasis and infiltrative densities.  Associated small pleural effusions more on the left than the right.  Original Report Authenticated By: Crawford Givens, M.D.   2-D echocardiogram  Study Conclusions  - Left ventricle: The cavity size was normal. Wall thickness was normal. The estimated ejection fraction was 65%. Wall motion was normal; there were no regional wall motion abnormalities. - Mitral valve: Mild regurgitation.      Disposition: discharged home in stable condition   Diet:  diabetic and heart healthy   Activity: increase activity gradually  Follow-up Appts: Discharge Orders    Future Appointments: Provider: Department: Dept Phone: Center:   04/18/2011 8:30 AM Lorenda Cahill [facility] [phone] [facility]   04/19/2011 8:45 AM Cassell Clement, MD [facility] [phone] None     Future Orders Please Complete By Expires   Diet - low sodium heart healthy      Diet Carb Modified      Increase activity slowly      No wound care      Call MD for:  severe uncontrolled pain      Call MD for:  extreme fatigue      Call MD for:  difficulty breathing, headache or visual disturbances         TESTS THAT NEED FOLLOW-UP  CBC and BMP in the next 5-7 days from hospital discharge when she sees her primary M.D.  Time spent on discharge, talking to the patient, and coordinating care:  35  mins.   SignedMarcellus Scott, MD 04/02/2011, 2:26 PM

## 2011-04-02 NOTE — Progress Notes (Signed)
IV removed, site unremarkable. Pressure and dressing applied. Discharge instructions, prescriptions, and follow-up appointments given. All belongings sent with patient and all questions answered. Pt transported in wheelchair by tech and traveled home with husband by private vehicle.

## 2011-04-02 NOTE — Progress Notes (Signed)
   CARE MANAGEMENT NOTE 04/02/2011  Patient:  Meredith Rodriguez, Meredith Rodriguez   Account Number:  [id number]  Date Initiated:  04/02/2011  Documentation initiated by:  [facility]  Subjective/Objective Assessment:   Admitted with anemia. Lives with spouse who is availble 24hrs.     Action/Plan:   PT eval- no home PT needs   Anticipated DC Date:  04/02/2011   Anticipated DC Plan:  HOME/SELF CARE      DC Planning Services  CM consult      Choice offered to / List presented to:             Status of service:  Completed, signed off Medicare Important Message given?   (If response is "NO", the following Medicare IM given date fields will be blank) Date Medicare IM given:   Date Additional Medicare IM given:    Discharge Disposition:  HOME/SELF CARE  Per UR Regulation:  Reviewed for med. necessity/level of care/duration of stay  Comments:  PCP Dr. Evelena Peat

## 2011-04-03 ENCOUNTER — Encounter (HOSPITAL_COMMUNITY): Payer: Self-pay | Admitting: Gastroenterology

## 2011-04-03 ENCOUNTER — Telehealth: Payer: Self-pay | Admitting: Family Medicine

## 2011-04-03 NOTE — Telephone Encounter (Signed)
Pt called and said that she was discharged from hosp yesterday and needed to sch a hosp fup to see pcp in 5-7 days, re: pneumonia and loss of blood.Pt also has an order from hosp to get cbc and bmp drawn. Pt is schd to come in to see Dr Caryl Never on Mon 04/08/11 at 9:30, because pts spouse is off work that day and could bring the pt to appt. Pt is bringing all hosp discharge papers and orders with her to ov.

## 2011-04-03 NOTE — Telephone Encounter (Signed)
FYI only.

## 2011-04-08 ENCOUNTER — Encounter: Payer: Self-pay | Admitting: Family Medicine

## 2011-04-08 ENCOUNTER — Ambulatory Visit (INDEPENDENT_AMBULATORY_CARE_PROVIDER_SITE_OTHER): Payer: Medicare Other | Admitting: Family Medicine

## 2011-04-08 DIAGNOSIS — R609 Edema, unspecified: Secondary | ICD-10-CM

## 2011-04-08 DIAGNOSIS — E119 Type 2 diabetes mellitus without complications: Secondary | ICD-10-CM

## 2011-04-08 DIAGNOSIS — L03119 Cellulitis of unspecified part of limb: Secondary | ICD-10-CM

## 2011-04-08 DIAGNOSIS — J189 Pneumonia, unspecified organism: Secondary | ICD-10-CM

## 2011-04-08 DIAGNOSIS — D649 Anemia, unspecified: Secondary | ICD-10-CM

## 2011-04-08 LAB — BASIC METABOLIC PANEL
BUN: 28 mg/dL — ABNORMAL HIGH (ref 6–23)
Chloride: 98 mEq/L (ref 96–112)
Glucose, Bld: 114 mg/dL — ABNORMAL HIGH (ref 70–99)
Potassium: 5.3 mEq/L — ABNORMAL HIGH (ref 3.5–5.1)

## 2011-04-08 LAB — CBC WITH DIFFERENTIAL/PLATELET
Basophils Relative: 0.1 % (ref 0.0–3.0)
Eosinophils Relative: 1.4 % (ref 0.0–5.0)
Hemoglobin: 9.8 g/dL — ABNORMAL LOW (ref 12.0–15.0)
Lymphocytes Relative: 12.3 % (ref 12.0–46.0)
MCV: 102.1 fl — ABNORMAL HIGH (ref 78.0–100.0)
Monocytes Absolute: 0 10*3/uL — ABNORMAL LOW (ref 0.1–1.0)
Neutrophils Relative %: 86 % — ABNORMAL HIGH (ref 43.0–77.0)
RBC: 2.93 Mil/uL — ABNORMAL LOW (ref 3.87–5.11)
WBC: 6.7 10*3/uL (ref 4.5–10.5)

## 2011-04-08 MED ORDER — GLIMEPIRIDE 2 MG PO TABS: 2.0000 mg | ORAL_TABLET | ORAL | Status: DC

## 2011-04-08 NOTE — Progress Notes (Signed)
  Subjective:    Patient ID: Meredith Rodriguez, female    DOB: 01/24/1926, 77 y.o.   MRN: [id number]  HPI  Hospital followup. Patient had presented here recently with fatigue and weakness and cough. Was noted to have probable pneumonia by chest x-ray and started on Levaquin. She called back after starting Levaquin with increased peripheral edema. Recent labs had revealed hemoglobin 7.0 and we were suspicious that he may have high output heart failure related to that. She was having some dyspnea. We recommended emergency room evaluation and she was admitted with pneumonia, anemia, and noted to have bilateral thigh cellulitis-which was a new finding. Patient transfused 2 units red blood cells. EGD revealed no evidence for bleeding. Recent colonoscopy unremarkable.  Patient had thrombocytosis with platelet count 634,000 on admission and white blood count 22.5 thousand likely related to her pneumonia and cellulitis. No peripheral smear done to evaluate anemia and anemia panel suggestive of anemia of chronic disease. Recommendation for outpatient hematology consultation to further evaluate.  Patient treated with IV Rocephin and Zithromax initially for pneumonia. Chest x-ray did show some improvement. Denies any cough or shortness of breath at this time. Blood sugars are elevated during hospitalization and currently around 130 fasting with over 200 later that day. A1c 7.7% hospital was increased for her. Her thigh cellulitis briefly treated with Vancomycin and pt discharged on Doxycycline.  Now off antibiotics.  Peripheral edema treated with Lasix 40 mg daily. Question of diastolic heart failure versus related to anemia  Review of Systems  Constitutional: Positive for fatigue. Negative for fever and chills.  Respiratory: Negative for cough, shortness of breath and wheezing.   Cardiovascular: Negative for chest pain, palpitations and leg swelling.  Gastrointestinal: Negative for nausea, vomiting, abdominal pain,  diarrhea, constipation and blood in stool.  Genitourinary: Negative for dysuria.  Neurological: Positive for weakness. Negative for dizziness and syncope.  Hematological: Negative for adenopathy.  Psychiatric/Behavioral: Negative for confusion.       Objective:   Physical Exam  Constitutional: She is oriented to person, place, and time. She appears well-developed and well-nourished.  HENT:  Right Ear: External ear normal.  Left Ear: External ear normal.  Mouth/Throat: Oropharynx is clear and moist.  Neck: Neck supple. No thyromegaly present.  Cardiovascular: Normal rate and regular rhythm.   Pulmonary/Chest: Effort normal and breath sounds normal. No respiratory distress. She has no wheezes. She has no rales.  Abdominal: Soft. There is no tenderness.  Musculoskeletal: She exhibits no edema.  Lymphadenopathy:    She has no cervical adenopathy.  Neurological: She is alert and oriented to person, place, and time.  Psychiatric: She has a normal mood and affect. Her behavior is normal.          Assessment & Plan:  #1 recent pneumonia clinically improved. Repeat chest x-ray in one to 2 weeks to reevaluate and document clearing #2 anemia with recent unremarkable colonoscopy and EGD and hgb down to 7 pre-transfusion. Hematology referral to further evaluate.  She has hx of macrocytosis with normal thyroid, folate, and B12 levels. #3 recent cellulitis involving both thighs clinically improved no further antibiotics indicated at this time #4 peripheral edema likely related to combination of cellulitis and question some CHF related to diastolic failure and possibly component of high output cardiac failure clinically improved #5 type 2 diabetes poorly controlled. Recent increased A1c. Add low dose glimepiride 2 mg daily

## 2011-04-08 NOTE — Patient Instructions (Signed)
Follow up immediately for any fever, increased dizziness, progressive weakness, or any other concerns

## 2011-04-09 NOTE — Progress Notes (Signed)
Quick Note:  Pt informed and she voiced her understanding. She reports she does not take K supplement. There is 80 mg potassium in her womens ultra vitamin daily. She does eat a bananas and there is potassium in her cereal? Please advise ______

## 2011-04-10 ENCOUNTER — Telehealth: Payer: Self-pay | Admitting: *Deleted

## 2011-04-10 NOTE — Telephone Encounter (Signed)
patient confirmed over the phone about appointment for 04-18-2011 starting at 12:00pm with labs and fc

## 2011-04-11 ENCOUNTER — Telehealth: Payer: Self-pay | Admitting: Oncology

## 2011-04-11 NOTE — Telephone Encounter (Signed)
Referred by Dr. Caryl Never Dx-Anemia/Thrombocytosis

## 2011-04-12 NOTE — Progress Notes (Signed)
Quick Note:  Pt informed, will stop multi vit until further notice ______

## 2011-04-17 ENCOUNTER — Encounter: Payer: Self-pay | Admitting: Cardiology

## 2011-04-17 ENCOUNTER — Ambulatory Visit: Payer: Medicare Other | Admitting: Cardiology

## 2011-04-17 ENCOUNTER — Ambulatory Visit (INDEPENDENT_AMBULATORY_CARE_PROVIDER_SITE_OTHER): Payer: Medicare Other | Admitting: Cardiology

## 2011-04-17 VITALS — BP 110/60 | HR 80 | Ht 65.0 in | Wt 102.0 lb

## 2011-04-17 DIAGNOSIS — I119 Hypertensive heart disease without heart failure: Secondary | ICD-10-CM

## 2011-04-17 DIAGNOSIS — E7800 Pure hypercholesterolemia, unspecified: Secondary | ICD-10-CM

## 2011-04-17 DIAGNOSIS — I10 Essential (primary) hypertension: Secondary | ICD-10-CM

## 2011-04-17 DIAGNOSIS — R634 Abnormal weight loss: Secondary | ICD-10-CM

## 2011-04-17 DIAGNOSIS — E119 Type 2 diabetes mellitus without complications: Secondary | ICD-10-CM

## 2011-04-17 NOTE — Assessment & Plan Note (Signed)
Patient is on simvastatin 5 mg daily.  She is not having any adverse effects from the low-dose statin therapy .

## 2011-04-17 NOTE — Progress Notes (Signed)
Meredith Rodriguez Date of Birth:  1926-02-11 [facility] [address]   [address] [place], [place]  [address] [phone]           Fax   [phone]  History of Present Illness: This pleasant 77 year old woman is seen for a scheduled followup office visit.  As her last saw her she is hospitalized with probable pneumonia by chest x-ray and was also found to have bilateral cellulitis of the thighs which was a new finding.  She also had unexplained anemia in the hospital and was transfused 2 units of packed red cells.  GI workup revealed no evidence of bleeding.  Recent colonoscopy was unremarkable.  Her white count was 22,000.  Platelet count was 634,000 on admission.  She has a outpatient hematology consultation scheduled tomorrow with Dr. Welton Flakes.  She still feels very weak as a result of the previous hospitalization and anemia.  From the cardiac standpoint she has not been expressing any chest pain or angina pectoris.  She's had no TIA symptoms.  Current Outpatient Prescriptions  Medication Sig Dispense Refill  . amLODipine (NORVASC) 5 MG tablet Take 1 tablet (5 mg total) by mouth daily.  90 tablet  3  . Ascorbic Acid (VITAMIN C) 500 MG tablet Take 500 mg by mouth daily.        Marland Kitchen aspirin 81 MG tablet Take 81 mg by mouth daily.        Marland Kitchen glimepiride (AMARYL) 2 MG tablet Take 1 tablet (2 mg total) by mouth every morning.  30 tablet  11  . metoprolol (LOPRESSOR) 50 MG tablet Take 50 mg by mouth 2 (two) times daily.        . mometasone (ELOCON) 0.1 % lotion Apply 1 application topically daily.        . Omega-3 Fatty Acids (FISH OIL) 1000 MG CAPS Take 1,000 mg by mouth daily.       . simvastatin (ZOCOR) 5 MG tablet Take 1 tablet (5 mg total) by mouth at bedtime.  90 tablet  1  . traMADol (ULTRAM) 50 MG tablet Take 50 mg by mouth every 6 (six) hours as needed. For pain.        Allergies  Allergen Reactions  . Amoxicillin (Amoxil)     Upset stomach  . Cephalexin Nausea Only   . Metformin And Related     Gi problems  . Norvasc (Amlodipine Besylate)     edema  . Phenergan (Promethazine Hcl)   . Procardia (Nifedipine)     dizziness    Patient Active Problem List  Diagnoses  . DIABETES MELLITUS II, UNCOMPLICATED  . HYPERLIPIDEMIA  . HYPONATREMIA  . ANXIETY  . NEUROPATHY, PERIPHERAL  . HYPERTENSION, BENIGN SYSTEMIC  . HYPERTENSION  . EXTERNAL HEMORRHOIDS WITHOUT MENTION COMP  . VAGINITIS, ATROPHIC  . Diabetes mellitus  . Hyperkalemia  . IBS (irritable bowel syndrome)  . Hypercholesterolemia  . CERUMEN IMPACTION  . Anemia  . Edema  . Cellulitis of leg  . Leukocytosis  . Pneumonia    History  Smoking status  . Never Smoker   Smokeless tobacco  . Not on file    History  Alcohol Use No    Family History  Problem Relation Age of Onset  . Stroke Mother   . Hypertension Father   . Cancer Sister   . Cancer Sister   . Diabetes Sister   . Cancer Sister     colon cancer  . Cancer Daughter  breast cancer    Review of Systems: Constitutional: no fever chills diaphoresis or fatigue or change in weight.  Head and neck: no hearing loss, no epistaxis, no photophobia or visual disturbance. Respiratory: No cough, shortness of breath or wheezing. Cardiovascular: No chest pain peripheral edema, palpitations. Gastrointestinal: No abdominal distention, no abdominal pain, no change in bowel habits hematochezia or melena. Genitourinary: No dysuria, no frequency, no urgency, no nocturia. Musculoskeletal:No arthralgias, no back pain, no gait disturbance or myalgias. Neurological: No dizziness, no headaches, no numbness, no seizures, no syncope, no weakness, no tremors. Hematologic: No lymphadenopathy, no easy bruising. Psychiatric: No confusion, no hallucinations, no sleep disturbance.    Physical Exam: Filed Vitals:   04/17/11 0912  BP: 110/60  Pulse: 80   the general appearance reveals a thin elderly woman in no distress.Pupils equal and  reactive.   Extraocular Movements are full.  There is no scleral icterus.  The mouth and pharynx are normal.  The neck is supple.  The carotids reveals a soft right carotid bruit.  The jugular venous pressure is normal.  The thyroid is not enlarged.  There is no lymphadenopathy.The chest is clear to percussion and auscultation. There are no rales or rhonchi. Expansion of the chest is symmetrical.  The precordium is quiet.  The first heart sound is normal.  The second heart sound is physiologically split.  There is no murmur gallop rub or click.  There is no abnormal lift or heave.  The abdomen is soft and nontender. Bowel sounds are normal. The liver and spleen are not enlarged. There Are no abdominal masses. There are no bruits.  The pedal pulses are good.  There is no phlebitis or edema.  There is no cyanosis or clubbing. Strength is normal and symmetrical in all extremities.  There is no lateralizing weakness.  There are no sensory deficits.  The skin is warm and dry.  There is no rash.      Assessment / Plan: Continue same medication.  Results of hematology evaluation.  Recheck in 4 months for office visit EKG and fasting lab work

## 2011-04-17 NOTE — Patient Instructions (Addendum)
Your physician recommends that you continue on your current medications as directed. Please refer to the Current Medication list given to you today. Your physician wants you to follow-up in: 4 months You will receive a reminder letter in the mail two months in advance. If you don't receive a letter, please call our office to schedule the follow-up appointment.  

## 2011-04-17 NOTE — Assessment & Plan Note (Signed)
The patient has not been experiencing any hypoglycemic episodes. 

## 2011-04-17 NOTE — Assessment & Plan Note (Signed)
Blood pressure is stable on current therapy.  Is not experiencing any headaches or dizziness.  No syncope

## 2011-04-18 ENCOUNTER — Other Ambulatory Visit: Payer: Medicare Other | Admitting: *Deleted

## 2011-04-18 ENCOUNTER — Other Ambulatory Visit: Payer: Medicare Other | Admitting: Lab

## 2011-04-18 ENCOUNTER — Ambulatory Visit: Payer: Medicare Other

## 2011-04-18 ENCOUNTER — Ambulatory Visit (HOSPITAL_BASED_OUTPATIENT_CLINIC_OR_DEPARTMENT_OTHER): Payer: Medicare Other

## 2011-04-18 ENCOUNTER — Other Ambulatory Visit: Payer: Self-pay | Admitting: Oncology

## 2011-04-18 ENCOUNTER — Ambulatory Visit (HOSPITAL_BASED_OUTPATIENT_CLINIC_OR_DEPARTMENT_OTHER): Payer: Medicare Other | Admitting: Oncology

## 2011-04-18 VITALS — BP 149/73 | HR 74 | Temp 97.3°F | Ht 65.0 in | Wt 101.6 lb

## 2011-04-18 DIAGNOSIS — D649 Anemia, unspecified: Secondary | ICD-10-CM

## 2011-04-18 DIAGNOSIS — E785 Hyperlipidemia, unspecified: Secondary | ICD-10-CM

## 2011-04-18 DIAGNOSIS — I10 Essential (primary) hypertension: Secondary | ICD-10-CM

## 2011-04-18 DIAGNOSIS — Z862 Personal history of diseases of the blood and blood-forming organs and certain disorders involving the immune mechanism: Secondary | ICD-10-CM

## 2011-04-18 LAB — CBC & DIFF AND RETIC
Basophils Absolute: 0 10*3/uL (ref 0.0–0.1)
Eosinophils Absolute: 0.2 10*3/uL (ref 0.0–0.5)
HGB: 8.4 g/dL — ABNORMAL LOW (ref 11.6–15.9)
LYMPH%: 25.8 % (ref 14.0–49.7)
MCV: 100.8 fL (ref 79.5–101.0)
MONO%: 0.5 % (ref 0.0–14.0)
NEUT#: 4.1 10*3/uL (ref 1.5–6.5)
Platelets: 763 10*3/uL — ABNORMAL HIGH (ref 145–400)
RBC: 2.59 10*6/uL — ABNORMAL LOW (ref 3.70–5.45)

## 2011-04-18 LAB — TECHNOLOGIST REVIEW

## 2011-04-18 NOTE — Patient Instructions (Signed)
Bone Marrow Aspiration and Bone Biopsy Examination of the bone marrow is a valuable test to diagnose blood disorders. A bone marrow biopsy takes a sample of bone and a small amount of fluid and cells from inside the bone. A bone marrow aspiration removes only the marrow. Bone marrow aspiration and bone biopsies are used to stage different disorders of the blood, such as leukemia. Staging will help your caregiver understand how far the disease has progressed.  The tests are also useful in diagnosing:  Fever of unknown origin (FUO).   Bacterial infections and other widespread fungal infections.   Cancers that have spread (metastasized) to the bone marrow.   Diseases that are characterized by a deficiency of an enzyme (storage diseases). This includes:   Niemann-Pick disease.   Gaucher disease.  PROCEDURE  Sites used to get samples include:   Back of your hip bone (posterior iliac crest).   Both aspiration and biopsy.   Front of your hip bone (anterior iliac crest).   Both aspiration and biopsy.   Breastbone (sternum).   Aspiration from your breastbone (done only in adults). This method is rarely used.  When you get a hip bone aspiration:  You are placed lying on your side with the upper knee brought up and flexed with the lower leg straight.   The site is prepared, cleaned with an antiseptic scrub, and draped. This keeps the biopsy area clean.   The skin and the area down to the lining of the bone (periosteum) are made numb with a local anesthetic.   The bone marrow aspiration needle is inserted. You will feel pressure on your bone.   Once inside the marrow cavity, a sample of bone marrow is sucked out (aspirated) for pathology slides.   The material collected for bone marrow slides is processed immediately by a technologist.   The technician selects the marrow particles to make the slides for pathology.   The marrow aspiration needle is removed. Then pressure is applied to  the site with gauze until bleeding has stopped.  Following an aspiration, a bone marrow biopsy may be performed as well. The technique for this is very similar. A dressing is then applied.  RISKS AND COMPLICATIONS  The main complications of a bone marrow aspiration and biopsy include infection and bleeding.   Complications are uncommon. The procedure may not be performed in patients with bleeding tendencies.   A very rare complication from the procedure is injury to the heart during a breastbone (sternal) marrow aspiration. Only bone marrow aspirations are performed in this area.   Long-lasting pain at the site of the bone marrow aspiration and biopsy is uncommon.  Your caregiver will let you know when you are to get your results and will discuss them with you. You may make an appointment with your caregiver to find out the results. Do not assume everything is normal if you have not heard from your caregiver or the medical facility. It is important for you to follow up on all of your test results. Document Released: 04/18/2004 Document Revised: 12/26/2010 Document Reviewed: 04/12/2008 [employer] Patient Information 2012 [employer]. 

## 2011-04-19 ENCOUNTER — Telehealth: Payer: Self-pay | Admitting: *Deleted

## 2011-04-19 ENCOUNTER — Ambulatory Visit: Payer: Medicare Other | Admitting: Cardiology

## 2011-04-19 LAB — COMPREHENSIVE METABOLIC PANEL
ALT: 17 U/L (ref 0–35)
AST: 22 U/L (ref 0–37)
BUN: 17 mg/dL (ref 6–23)
Creatinine, Ser: 0.68 mg/dL (ref 0.50–1.10)
Total Bilirubin: 0.8 mg/dL (ref 0.3–1.2)

## 2011-04-19 LAB — IRON AND TIBC
%SAT: 83 % — ABNORMAL HIGH (ref 20–55)
TIBC: 288 ug/dL (ref 250–470)

## 2011-04-19 LAB — ERYTHROPOIETIN: Erythropoietin: 197 m[IU]/mL — ABNORMAL HIGH (ref 2.6–34.0)

## 2011-04-19 LAB — VITAMIN B12: Vitamin B-12: >2000 pg/mL — ABNORMAL HIGH (ref 211–911)

## 2011-04-19 LAB — LACTATE DEHYDROGENASE: LDH: 223 U/L (ref 94–250)

## 2011-04-19 NOTE — Telephone Encounter (Signed)
gave patient appointment for 04-2011

## 2011-04-24 NOTE — Progress Notes (Signed)
Meredith Rodriguez [id number] [birth year] 77 y.o.  CC: Dr. Evelena Peat  REASON FOR CONSULTATION:  77 yo with profound anemia, S/P blood transfusions   REFERRING PHYSICIAN: Dr. Evelena Peat  HISTORY OF PRESENT ILLNESS:  Meredith Rodriguez is a 77 y.o. female with multiple medical problem who was recently admitted to [facility]: With weakness and fatigue 4 months and left lower extremity swelling for about a week. I did in addition patient had a CBC performed that showed a hemoglobin of 7.0. She had a full workup performed including colonoscopy and endoscopy which were unremarkable. Patient does have a history of macrocytosis in the past. However workup for thyroid folate and B12 levels have all been normal. During patient's hospitalization she did receive 2 units of packed red cells. Her hemoglobin upon discharge came back to 11.1 from 7.0. Most recently she was seen by Dr. Caryl Never who performed another CBC on her and at that time on 04/08/2011 patient's hemoglobin had drifted down to 9.8 hematocrit was 29.9 platelets 687,000 MCV was 102.1. Because of ongoing anemia patient is referred to hematology for further evaluation and management.   Past Medical History  Diagnosis Date  . Hypertension   . Diabetes mellitus   . Hyperkalemia   . IBS (irritable bowel syndrome)   . Hypercholesterolemia   . Cervical radiculopathy     Past Surgical History  Procedure Date  . Varicose veins     surgey 1959  . Tonsilectomy, adenoidectomy, bilateral myringotomy and tubes   . Esophagogastroduodenoscopy 04/01/2011    Procedure: ESOPHAGOGASTRODUODENOSCOPY (EGD);  Surgeon: Barrie Folk, MD;  Location: [facility] ENDOSCOPY;  Service: Endoscopy;  Laterality: N/A;    Family History  Problem Relation Age of Onset  . Stroke Mother   . Hypertension Father   . Cancer Sister   . Cancer Sister   . Diabetes Sister   . Cancer Sister     colon cancer  . Cancer Daughter     breast cancer    Social History History  Substance  Use Topics  . Smoking status: Never Smoker   . Smokeless tobacco: Not on file  . Alcohol Use: No    Allergies  Allergen Reactions  . Amoxicillin (Amoxil)     Upset stomach  . Cephalexin Nausea Only  . Metformin And Related     Gi problems  . Norvasc (Amlodipine Besylate)     edema  . Phenergan (Promethazine Hcl)   . Procardia (Nifedipine)     dizziness    Current Outpatient Prescriptions  Medication Sig Dispense Refill  . amLODipine (NORVASC) 5 MG tablet Take 1 tablet (5 mg total) by mouth daily.  90 tablet  3  . Ascorbic Acid (VITAMIN C) 500 MG tablet Take 500 mg by mouth daily.        Marland Kitchen aspirin 81 MG tablet Take 81 mg by mouth daily.        Marland Kitchen glimepiride (AMARYL) 2 MG tablet Take 1 tablet (2 mg total) by mouth every morning.  30 tablet  11  . metoprolol (LOPRESSOR) 50 MG tablet Take 50 mg by mouth 2 (two) times daily.        . mometasone (ELOCON) 0.1 % lotion Apply 1 application topically daily.        . Omega-3 Fatty Acids (FISH OIL) 1000 MG CAPS Take 1,000 mg by mouth daily.       . simvastatin (ZOCOR) 5 MG tablet Take 1 tablet (5 mg total) by mouth at bedtime.  90  tablet  1  . traMADol (ULTRAM) 50 MG tablet Take 50 mg by mouth every 6 (six) hours as needed. For pain.        REVIEW OF SYSTEMS:  Constitutional: positive for fatigue and malaise Eyes: positive for sclera is anicteric conjunctiva pallor is noted Ears, nose, mouth, throat, and face: negative Respiratory: negative Cardiovascular: negative Gastrointestinal: negative Genitourinary:negative Hematologic/lymphatic: positive for Patient is pale Musculoskeletal:positive for arthralgias and stiff joints Neurological: negative  ECOG performance Status: 1  PHYSICAL EXAMINATION: Blood pressure 149/73, pulse 74, temperature 97.3 F (36.3 C), temperature source Oral, height 5\' 5"  (1.651 m), weight 101 lb 9.6 oz (46.085 kg). HQI:ONGEX, healthy, no distress, cachectic, pale and smiling SKIN: skin color, texture,  turgor are normal, no rashes or significant lesions HEAD: Normocephalic, No masses, lesions, tenderness or abnormalities EYES: normal, PERRLA, EOMI, sclera clear EARS: External ears normal OROPHARYNX:no exudate, no erythema and lips, buccal mucosa, and tongue normal  NECK: supple, no adenopathy, no bruits, no JVD, thyroid normal size, non-tender, without nodularity LYMPH:  no palpable lymphadenopathy, no hepatosplenomegaly BREAST:not examined LUNGS: clear to auscultation , and palpation, clear to auscultation and percussion HEART: regular rate & rhythm, no gallops, S1 normal and S2 normal ABDOMEN:abdomen soft, non-tender and normal bowel sounds BACK: Back symmetric, no curvature., No CVA tenderness, Range of motion is normal EXTREMITIES:no joint deformities, effusion, or inflammation, no skin discoloration, no clubbing, no cyanosis, +1 edema  NEURO: alert & oriented x 3 with fluent speech, no focal motor/sensory deficits, gait normal, reflexes normal and symmetric    STUDIES/RESULTS: Dg Chest 2 View  04/01/2011  *RADIOLOGY REPORT*  Clinical Data: Follow up pneumonia, cough, weakness  CHEST - 2 VIEW  Comparison: Chest x-ray of 03/29/2011  Findings: There is little change in small pleural effusions.  No pneumonia is seen.  The lungs remain hyperaerated consistent with COPD.  Mediastinal contours are stable.  The heart is stable in size.  The bones are osteopenic.  IMPRESSION: Persistent small pleural effusions.  COPD.  No definite pneumonia.  Original Report Authenticated By: Juline Patch, M.D.   Dg Chest 2 View  03/29/2011  *RADIOLOGY REPORT*  Clinical Data: Cough.  Difficulty breathing.  CHEST - 2 VIEW  Comparison: 03/25/2011  Findings: There is hyperinflation of the lungs compatible with COPD.  There are bilateral pleural effusions and bibasilar atelectasis, similar to prior study.  Heart is mildly enlarged.  No change since prior study.  No acute bony abnormality.  IMPRESSION: COPD.  Small  effusions and bibasilar atelectasis.  No change.  Original Report Authenticated By: Cyndie Chime, M.D.   Dg Chest 2 View  03/25/2011  *RADIOLOGY REPORT*  Clinical Data: Coughing and fever.  Shortness of breath.  CHEST - 2 VIEW  Comparison: 10/15/2010.  Findings: The cardiac silhouette is normal size and shape. Ectasia and nonaneurysmal calcification of the thoracic aorta are seen. There is generalized overall hyperinflation configuration with flattening of diaphragm on lateral image.  Basilar atelectasis and infiltrative densities are seen with associated small pleural effusions more on the left in the right.  There is osteopenic appearance of the bones.  Changes of degenerative disc disease and degenerative spondylosis.  IMPRESSION: COPD configuration.  Basilar atelectasis and infiltrative densities.  Associated small pleural effusions more on the left than the right.  Original Report Authenticated By: Crawford Givens, M.D.     ASSESSMENT    77 year old female with multiple medical problems including hypertension hyperlipidemia diabetes and high output heart failure. Patient recently admitted  with a hemoglobin of 7.0. She is status post 2 units of packed red cells. Patient does have a previous history of macrocytosis. Her anemia could be multifactorial including anemia of chronic disease versus iron deficiency or macrocytosis. She did have a full GI workup performed which was unrevealing for blood loss    PLAN:    We will check a full iron panel in and get a B12 folate level as well as erythropoietin level. I did discuss the possibility of this being myelodysplastic syndrome in an 77 year old and she may possibly need a bone marrow biopsy and aspirate to rule out early myelodysplastic syndrome. The plan was discussed with both the patient as well as her daughter I will see her back after I have the results of her laboratory tests.     All questions were answered. The patient knows to call the clinic  with any problems, questions or concerns. We can certainly see the patient much sooner if necessary.  Thank you so much for allowing me to participate in the care of Meredith Rodriguez. I will continue to follow up the patient with you and assist in her care.  I spent 40 minutes counseling the patient face to face. The total time spent in the appointment was 60 minutes. Drue Second, MD Medical/Oncology [facility] [phone] (beeper) [phone] (Office)  04/24/2011, 11:05 AM 04/24/2011, 11:05 AM

## 2011-05-07 ENCOUNTER — Telehealth: Payer: Self-pay | Admitting: *Deleted

## 2011-05-07 NOTE — Telephone Encounter (Signed)
left voice message to inform the patient of the new date and time on 05-21-2011 starting at 8:30am

## 2011-05-09 ENCOUNTER — Encounter: Payer: Self-pay | Admitting: Family Medicine

## 2011-05-09 ENCOUNTER — Ambulatory Visit (INDEPENDENT_AMBULATORY_CARE_PROVIDER_SITE_OTHER): Payer: Medicare Other | Admitting: Family Medicine

## 2011-05-09 DIAGNOSIS — R609 Edema, unspecified: Secondary | ICD-10-CM

## 2011-05-09 DIAGNOSIS — R600 Localized edema: Secondary | ICD-10-CM

## 2011-05-09 DIAGNOSIS — E119 Type 2 diabetes mellitus without complications: Secondary | ICD-10-CM

## 2011-05-09 DIAGNOSIS — D649 Anemia, unspecified: Secondary | ICD-10-CM

## 2011-05-09 MED ORDER — GLUCOSE BLOOD VI STRP: ORAL_STRIP | Status: DC

## 2011-05-09 NOTE — Progress Notes (Signed)
  Subjective:    Patient ID: Meredith Rodriguez, female    DOB: [birth year], 78 y.o.   MRN: [id number]  HPI  Medical followup. Patient has multiple chronic medical problems. Recent admission for pneumonia and cellulitis. She had anemia with EGD unremarkable. Recent colonoscopy unremarkable. She has macrocytosis. Referral to hematologist and workup underway. Bone marrow biopsy has been scheduled. She also has a chronic thrombocytosis. She feels very fatigued overall. No orthostasis. She has had improved appetite weight is up 5 pounds since last visit. She's not had any hematemesis or melena.  Recent initiation Amaryl 2 mg. Blood sugars have been up postprandial but not checking fasting. She's not sure of accuracy of current meter. Given new glucose meter today. No symptoms of hyper or hypoglycemia.  Recent peripheral edema on low dose furosemide initially and she has now discontinued.  No dyspnea.  Past Medical History  Diagnosis Date  . Hypertension   . Diabetes mellitus   . Hyperkalemia   . IBS (irritable bowel syndrome)   . Hypercholesterolemia   . Cervical radiculopathy    Past Surgical History  Procedure Date  . Varicose veins     surgey 1959  . Tonsilectomy, adenoidectomy, bilateral myringotomy and tubes   . Esophagogastroduodenoscopy 04/01/2011    Procedure: ESOPHAGOGASTRODUODENOSCOPY (EGD);  Surgeon: Barrie Folk, MD;  Location: [facility] ENDOSCOPY;  Service: Endoscopy;  Laterality: N/A;    reports that she has never smoked. She does not have any smokeless tobacco history on file. She reports that she does not drink alcohol or use illicit drugs. family history includes Cancer in her daughter and sisters; Diabetes in her sister; Hypertension in her father; and Stroke in her mother. Allergies  Allergen Reactions  . Amoxicillin (Amoxil)     Upset stomach  . Cephalexin Nausea Only  . Metformin And Related     Gi problems  . Norvasc (Amlodipine Besylate)     edema  . Phenergan (Promethazine  Hcl)   . Procardia (Nifedipine)     dizziness     Review of Systems  Constitutional: Positive for fatigue. Negative for fever, chills, appetite change and unexpected weight change.  Respiratory: Negative for cough and shortness of breath.   Cardiovascular: Negative for chest pain and leg swelling.  Gastrointestinal: Negative for abdominal pain and blood in stool.  Genitourinary: Negative for dysuria.  Skin: Negative for rash.  Neurological: Positive for weakness. Negative for dizziness and syncope.  Hematological: Negative for adenopathy. Does not bruise/bleed easily.  Psychiatric/Behavioral: Negative for confusion.       Objective:   Physical Exam  Constitutional: She is oriented to person, place, and time.       Patient in and slightly pale in no distress  HENT:  Right Ear: External ear normal.  Left Ear: External ear normal.  Mouth/Throat: Oropharynx is clear and moist.  Neck: Neck supple.  Cardiovascular: Normal rate and regular rhythm.   Pulmonary/Chest: Effort normal and breath sounds normal. No respiratory distress. She has no wheezes. She has no rales.  Musculoskeletal: She exhibits no edema.  Neurological: She is alert and oriented to person, place, and time.          Assessment & Plan:  #1 macrocytic anemia. Evaluation under way by oncology. #2 type 2 diabetes with history of recent poor control. Continue Amaryl. New meter given. Call with fasting readings next week. Titrate as necessary. Repeat A1c in 3 months #3 history of recent peripheral edema resolved off furosemide

## 2011-05-09 NOTE — Patient Instructions (Signed)
Check fasting blood sugars regularly over the next week and call with readings.

## 2011-05-15 ENCOUNTER — Ambulatory Visit: Payer: Medicare Other | Admitting: Oncology

## 2011-05-15 ENCOUNTER — Other Ambulatory Visit: Payer: Medicare Other | Admitting: Lab

## 2011-05-21 ENCOUNTER — Other Ambulatory Visit: Payer: Self-pay | Admitting: Family

## 2011-05-21 ENCOUNTER — Encounter (HOSPITAL_COMMUNITY)
Admission: RE | Admit: 2011-05-21 | Discharge: 2011-05-21 | Disposition: A | Discharge status: Home / Self Care | Payer: Medicare Other | Source: Ambulatory Visit | Attending: Oncology | Admitting: Oncology

## 2011-05-21 ENCOUNTER — Ambulatory Visit: Payer: Medicare Other

## 2011-05-21 ENCOUNTER — Ambulatory Visit: Payer: Medicare Other | Admitting: Family Medicine

## 2011-05-21 ENCOUNTER — Telehealth: Payer: Self-pay | Admitting: Oncology

## 2011-05-21 ENCOUNTER — Other Ambulatory Visit: Payer: Self-pay | Admitting: *Deleted

## 2011-05-21 ENCOUNTER — Telehealth: Payer: Self-pay | Admitting: *Deleted

## 2011-05-21 ENCOUNTER — Other Ambulatory Visit: Payer: Self-pay | Admitting: Certified Registered Nurse Anesthetist

## 2011-05-21 ENCOUNTER — Ambulatory Visit (HOSPITAL_BASED_OUTPATIENT_CLINIC_OR_DEPARTMENT_OTHER): Payer: Medicare Other

## 2011-05-21 ENCOUNTER — Ambulatory Visit: Payer: Medicare Other | Admitting: Oncology

## 2011-05-21 ENCOUNTER — Other Ambulatory Visit (HOSPITAL_BASED_OUTPATIENT_CLINIC_OR_DEPARTMENT_OTHER): Payer: Medicare Other | Admitting: Lab

## 2011-05-21 VITALS — BP 174/74 | HR 70 | Temp 94.5°F | Resp 16

## 2011-05-21 VITALS — BP 155/64 | HR 81 | Temp 97.5°F | Ht 65.0 in | Wt 106.2 lb

## 2011-05-21 DIAGNOSIS — D649 Anemia, unspecified: Secondary | ICD-10-CM

## 2011-05-21 DIAGNOSIS — I119 Hypertensive heart disease without heart failure: Secondary | ICD-10-CM

## 2011-05-21 DIAGNOSIS — E7800 Pure hypercholesterolemia, unspecified: Secondary | ICD-10-CM

## 2011-05-21 LAB — IRON AND TIBC
Iron: 270 ug/dL — ABNORMAL HIGH (ref 42–145)
UIBC: <15 ug/dL — ABNORMAL LOW (ref 125–400)

## 2011-05-21 LAB — CBC WITH DIFFERENTIAL/PLATELET
Basophils Absolute: 0 10*3/uL (ref 0.0–0.1)
Eosinophils Absolute: 0.1 10*3/uL (ref 0.0–0.5)
HGB: 5.9 g/dL — CL (ref 11.6–15.9)
NEUT#: 4.5 10*3/uL (ref 1.5–6.5)
RDW: 23.3 % — ABNORMAL HIGH (ref 11.2–14.5)
WBC: 5.6 10*3/uL (ref 3.9–10.3)
lymph#: 1 10*3/uL (ref 0.9–3.3)
nRBC: 0 % (ref 0–0)

## 2011-05-21 LAB — TYPE AND SCREEN
ABO/RH(D): AB NEG
Antibody Screen: NEGATIVE
Unit division: 0

## 2011-05-21 LAB — ABO/RH: ABO/RH(D): AB NEG

## 2011-05-21 LAB — FERRITIN: Ferritin: 456 ng/mL — ABNORMAL HIGH (ref 10–291)

## 2011-05-21 MED ORDER — ACETAMINOPHEN 325 MG PO TABS
650.0000 mg | ORAL_TABLET | Freq: Once | ORAL | Status: AC
  Administered 2011-05-21: 650 mg via ORAL

## 2011-05-21 MED ORDER — DIPHENHYDRAMINE HCL 50 MG/ML IJ SOLN
25.0000 mg | Freq: Once | INTRAMUSCULAR | Status: AC
  Administered 2011-05-21: 25 mg via INTRAVENOUS

## 2011-05-21 NOTE — Telephone Encounter (Signed)
gve the pt her jan 2013 appt calendar °

## 2011-05-21 NOTE — Patient Instructions (Signed)
Call md for problems 

## 2011-05-21 NOTE — Progress Notes (Signed)
OFFICE PROGRESS NOTE  CC  Meredith Covey, MD, MD [address] [place] [address]  DIAGNOSIS: 78-year-old female with severe and profound anemia likely due to a primary bone marrow problem. Differential does include myelodysplastic syndrome and anemia of chronic disease or  PRIOR THERAPY:  #1 patient is status post packed red cell transfusion during recent hospitalization.  CURRENT THERAPY:Patient to received 2 units of packed red cells today. She will also undergo a bone marrow biopsy and aspirate on 05/28/2011  INTERVAL HISTORY: Meredith Rodriguez 78 y.o. female returns for Followup visit today. She was last seen by me on 04/24/2011. Today patient is significantly more pale. She has complaints of weakness fatigue and no energy. She is unable to carry on any work activities do to weakness. She has had a little bit of diminishment in her appetite. She is having some shortness of breath on exertion. She is denying any pain. Patient has had some irritable bowel syndrome episodes with diarrhea chronically. She denies having noticed any bleeding problems in the you're in your stool. She has no nausea vomiting fevers chills night sweats headaches no chest pains or palpitations. Remainder of the 10 point review of systems is negative.  MEDICAL HISTORY: Past Medical History  Diagnosis Date  . Hypertension   . Diabetes mellitus   . Hyperkalemia   . IBS (irritable bowel syndrome)   . Hypercholesterolemia   . Cervical radiculopathy     ALLERGIES:  is allergic to amoxicillin; cephalexin; metformin and related; norvasc; phenergan; and procardia.  MEDICATIONS:  Current Outpatient Prescriptions  Medication Sig Dispense Refill  . amLODipine (NORVASC) 5 MG tablet Take 1 tablet (5 mg total) by mouth daily.  90 tablet  3  . Ascorbic Acid (VITAMIN C) 500 MG tablet Take 500 mg by mouth daily.        Marland Kitchen aspirin 81 MG tablet Take 81 mg by mouth daily.        Marland Kitchen glimepiride (AMARYL) 2 MG tablet  Take 1 tablet (2 mg total) by mouth every morning.  30 tablet  11  . glucose blood test strip Twice daily as instructed  100 each  3  . metoprolol (LOPRESSOR) 50 MG tablet Take 50 mg by mouth 2 (two) times daily.        . mometasone (ELOCON) 0.1 % lotion Apply 1 application topically as needed.       . Omega-3 Fatty Acids (FISH OIL) 1000 MG CAPS Take 1,000 mg by mouth daily.       . simvastatin (ZOCOR) 5 MG tablet Take 1 tablet (5 mg total) by mouth at bedtime.  90 tablet  1  . traMADol (ULTRAM) 50 MG tablet Take 50 mg by mouth every 6 (six) hours as needed. For pain.        SURGICAL HISTORY:  Past Surgical History  Procedure Date  . Varicose veins     surgey 1959  . Tonsilectomy, adenoidectomy, bilateral myringotomy and tubes   . Esophagogastroduodenoscopy 04/01/2011    Procedure: ESOPHAGOGASTRODUODENOSCOPY (EGD);  Surgeon: Barrie Folk, MD;  Location: [facility] ENDOSCOPY;  Service: Endoscopy;  Laterality: N/A;    REVIEW OF SYSTEMS:  Pertinent items are noted in HPI.   PHYSICAL EXAMINATION: General appearance: alert, cooperative, appears stated age, flushed, mild distress and pale Head: Normocephalic, without obvious abnormality, atraumatic Neck: no adenopathy, no carotid bruit, no JVD, supple, symmetrical, trachea midline and thyroid not enlarged, symmetric, no tenderness/mass/nodules Lymph nodes: Cervical, supraclavicular, and axillary nodes normal. Resp: clear  to auscultation bilaterally and normal percussion bilaterally Back: symmetric, no curvature. ROM normal. No CVA tenderness. Cardio: regular rate and rhythm, S1, S2 normal, no murmur, click, rub or gallop and normal apical impulse GI: soft, non-tender; bowel sounds normal; no masses,  no organomegaly Extremities: extremities normal, atraumatic, no cyanosis or edema Neurologic: Alert and oriented X 3, normal strength and tone. Normal symmetric reflexes. Normal coordination and gait  ECOG PERFORMANCE STATUS: 2 - Symptomatic, <50%  confined to bed  Blood pressure 155/64, pulse 81, temperature 97.5 F (36.4 C), temperature source Oral, height 5\' 5"  (1.651 m), weight 106 lb 3.2 oz (48.172 kg).  LABORATORY DATA: Lab Results  Component Value Date   WBC 5.6 05/21/2011   HGB 5.9* 05/21/2011   HCT 18.6* 05/21/2011   MCV 104.5* 05/21/2011   PLT 422* 05/21/2011      Chemistry      Component Value Date/Time   NA 138 04/18/2011 1349   K 5.0 04/18/2011 1349   CL 101 04/18/2011 1349   CO2 28 04/18/2011 1349   BUN 17 04/18/2011 1349   CREATININE 0.68 04/18/2011 1349   CREATININE 0.69 07/18/2010 1202      Component Value Date/Time   CALCIUM 10.1 04/18/2011 1349   ALKPHOS 28* 04/18/2011 1349   AST 22 04/18/2011 1349   ALT 17 04/18/2011 1349   BILITOT 0.8 04/18/2011 1349       RADIOGRAPHIC STUDIES:  No results found.  ASSESSMENT: 78 year old female with severe anemia likely secondary to a primary bone marrow process such as myelodysplastic syndrome or even anemia of chronic disease due to patient's multiple medical problems. Patient had iron studies performed on her last visit she is noted to have a total ferritin of 500. B12 is greater than 2000 and folate is normal. The erythropoietin level was also performed on her last visit and it is elevated at 197. All of this indicating that patient most likely has refractory anemia. Today she is very anemic and symptomatic from her anemia her hemoglobin is only 5.9. She denies having any bleeding problems or   PLAN:    #1 we will transfuse HER-2 units of packed red cells for symptomatic anemia.    #2 I have recommended proceeding with doing a bone marrow biopsy and aspirate to evaluate the bone marrow. If indeed patient has myelodysplastic syndrome she would be able to benefit from ESA therapy and ongoing supportive therapy.  #3 all questions were answered today I have explained the procedure to them that is the bone marrow biopsy and aspirate to have written material from  it there last visit with me. She will have her bone marrow done on 05/28/2011.   All questions were answered. The patient knows to call the clinic with any problems, questions or concerns. We can certainly see the patient much sooner if necessary.  I spent 25 minutes counseling the patient face to face. The total time spent in the appointment was 30 minutes.    Drue Second, MD Medical/Oncology [facility] [phone] (beeper) [phone] (Office)  05/21/2011, 10:01 AM

## 2011-05-22 ENCOUNTER — Telehealth: Payer: Self-pay | Admitting: *Deleted

## 2011-05-22 MED ORDER — ACCU-CHEK SOFT TOUCH LANCETS MISC: Status: DC

## 2011-05-22 NOTE — Telephone Encounter (Signed)
Pt. Need lancets called to [facility])

## 2011-05-27 ENCOUNTER — Other Ambulatory Visit: Payer: Self-pay | Admitting: *Deleted

## 2011-05-27 MED ORDER — METOPROLOL TARTRATE 50 MG PO TABS: 50.0000 mg | ORAL_TABLET | Freq: Two times a day (BID) | ORAL | Status: DC

## 2011-05-28 ENCOUNTER — Telehealth: Payer: Self-pay | Admitting: *Deleted

## 2011-05-28 ENCOUNTER — Encounter (HOSPITAL_BASED_OUTPATIENT_CLINIC_OR_DEPARTMENT_OTHER): Payer: Medicare Other | Admitting: Lab

## 2011-05-28 ENCOUNTER — Ambulatory Visit: Payer: Medicare Other

## 2011-05-28 ENCOUNTER — Other Ambulatory Visit (HOSPITAL_COMMUNITY)
Admission: RE | Admit: 2011-05-28 | Discharge: 2011-05-28 | Disposition: A | Discharge status: Home / Self Care | Payer: Medicare Other | Source: Ambulatory Visit | Attending: Oncology | Admitting: Oncology

## 2011-05-28 ENCOUNTER — Other Ambulatory Visit: Payer: Self-pay | Admitting: Oncology

## 2011-05-28 ENCOUNTER — Encounter (HOSPITAL_BASED_OUTPATIENT_CLINIC_OR_DEPARTMENT_OTHER): Payer: Medicare Other | Admitting: Oncology

## 2011-05-28 VITALS — BP 151/61 | HR 74 | Temp 97.6°F

## 2011-05-28 DIAGNOSIS — D649 Anemia, unspecified: Secondary | ICD-10-CM

## 2011-05-28 DIAGNOSIS — D72819 Decreased white blood cell count, unspecified: Secondary | ICD-10-CM | POA: Insufficient documentation

## 2011-05-28 LAB — CBC WITH DIFFERENTIAL/PLATELET
Basophils Absolute: 0 10*3/uL (ref 0.0–0.1)
Eosinophils Absolute: 0.1 10*3/uL (ref 0.0–0.5)
HGB: 9.8 g/dL — ABNORMAL LOW (ref 11.6–15.9)
NEUT#: 2.8 10*3/uL (ref 1.5–6.5)
RDW: 19.3 % — ABNORMAL HIGH (ref 11.2–14.5)
WBC: 3.7 10*3/uL — ABNORMAL LOW (ref 3.9–10.3)
lymph#: 0.8 10*3/uL — ABNORMAL LOW (ref 0.9–3.3)

## 2011-05-28 NOTE — Patient Instructions (Signed)
Reviewed Dressing must stay x24hrs, do not get wet.. Check for drainage. Reinforce in needed. Call with concerns.  Pt discharged via ambulatory with family. Pt verbalized understanding.

## 2011-05-28 NOTE — Progress Notes (Signed)
Bone Marrow Biopsy and Aspiration Procedure Note   Informed consent was obtained and potential risks including bleeding, infection and pain were reviewed with the patient.   Posterior iliac crest(s) prepped with Betadine.   Lidocaine 2% local anesthesia infiltrated into the subcutaneous tissue.  Right bone marrow biopsy and right bone marrow aspirate was obtained.   The procedure was tolerated well and there were no complications.  Specimens sent for: routine histopathologic stains and sectioning, flow cytometry and cytogenetics   Drue Second, MD Medical/Oncology [facility] [phone] (beeper) [phone] (Office)  05/28/2011, 10:51 AM

## 2011-05-28 NOTE — Progress Notes (Signed)
Time out performed, BM BX completed.  Pt in supine position x 30 mins.  Pt dressing checked, clean dry and intact. VSS.  Reviewed with pt to keep dressing on x24hr. Check prior to going to bed and upon waking for any drainage.  To reinforce with gauze if drainage on dressing.  Call if concerns/questions. Pt verbalized understanding.

## 2011-05-28 NOTE — Telephone Encounter (Signed)
Per orders from 05-28-2011

## 2011-06-04 ENCOUNTER — Ambulatory Visit (HOSPITAL_BASED_OUTPATIENT_CLINIC_OR_DEPARTMENT_OTHER): Payer: Medicare Other | Admitting: Family

## 2011-06-04 ENCOUNTER — Telehealth: Payer: Self-pay | Admitting: Oncology

## 2011-06-04 ENCOUNTER — Other Ambulatory Visit (HOSPITAL_BASED_OUTPATIENT_CLINIC_OR_DEPARTMENT_OTHER): Payer: Medicare Other

## 2011-06-04 ENCOUNTER — Encounter: Payer: Self-pay | Admitting: Family

## 2011-06-04 VITALS — BP 136/79 | HR 80 | Temp 98.4°F | Ht 65.0 in | Wt 103.9 lb

## 2011-06-04 DIAGNOSIS — D649 Anemia, unspecified: Secondary | ICD-10-CM

## 2011-06-04 LAB — CBC WITH DIFFERENTIAL/PLATELET
Eosinophils Absolute: 0.2 10*3/uL (ref 0.0–0.5)
HCT: 26.1 % — ABNORMAL LOW (ref 34.8–46.6)
LYMPH%: 17.4 % (ref 14.0–49.7)
MONO#: 0 10*3/uL — ABNORMAL LOW (ref 0.1–0.9)
NEUT#: 4.6 10*3/uL (ref 1.5–6.5)
Platelets: 618 10*3/uL — ABNORMAL HIGH (ref 145–400)
RBC: 2.66 10*6/uL — ABNORMAL LOW (ref 3.70–5.45)
WBC: 5.8 10*3/uL (ref 3.9–10.3)
lymph#: 1 10*3/uL (ref 0.9–3.3)
nRBC: 0 % (ref 0–0)

## 2011-06-04 NOTE — Telephone Encounter (Signed)
gve the pt her feb 2013 appt calendar °

## 2011-06-04 NOTE — Progress Notes (Signed)
OFFICE PROGRESS NOTE  CC: [name], MD [address] [place] [address]  DIAGNOSIS:  Profound anemia likely due to a primary bone marrow problem. Recent bone marrow biopsy shows fibrosis.   PRIOR THERAPY: 1. Two units PRBC's January 22.  2. Bone marrow biopsy and aspirate 05/28/2011.  CURRENT THERAPY:Observation. Blood transfusions as needed.   INTERVAL HISTORY: Last seen by Dr. Welton Flakes 05/20/10 and received 2 units PRBC's. Felt well for approximately 1 week, has been weak and tired for the last week. Dyspnea on exertion. No pain. Reports irritable bowel syndrome episodes with diarrhea chronically. No blood in the stool. No nausea, vomiting, fevers, chills, night sweats, headaches, chest pains or palpitations. Remainder of the 10 point review of systems is negative.  MEDICAL HISTORY: Past Medical History  Diagnosis Date  . Hypertension   . Diabetes mellitus   . Hyperkalemia   . IBS (irritable bowel syndrome)   . Hypercholesterolemia   . Cervical radiculopathy     ALLERGIES:  Amoxicillin; cephalexin; metformin and related; norvasc; phenergan; and procardia.  MEDICATIONS:  Current Outpatient Prescriptions  Medication Sig Dispense Refill  . amLODipine (NORVASC) 5 MG tablet Take 1 tablet (5 mg total) by mouth daily.  90 tablet  3  . Ascorbic Acid (VITAMIN C) 500 MG tablet Take 500 mg by mouth daily.        Marland Kitchen aspirin 81 MG tablet Take 81 mg by mouth daily.        Marland Kitchen glimepiride (AMARYL) 2 MG tablet Take 1 tablet (2 mg total) by mouth every morning.  30 tablet  11  . glucose blood test strip Twice daily as instructed  100 each  3  . Lancets (ACCU-CHEK SOFT TOUCH) lancets Use as instructed twice daily  100 each  12  . metoprolol (LOPRESSOR) 50 MG tablet Take 1 tablet (50 mg total) by mouth 2 (two) times daily.  180 tablet  3  . mometasone (ELOCON) 0.1 % lotion Apply 1 application topically as needed.       . Omega-3 Fatty Acids (FISH OIL) 1000 MG CAPS Take 1,000  mg by mouth daily.       . simvastatin (ZOCOR) 5 MG tablet Take 1 tablet (5 mg total) by mouth at bedtime.  90 tablet  1  . traMADol (ULTRAM) 50 MG tablet Take 50 mg by mouth every 6 (six) hours as needed. For pain.        SURGICAL HISTORY:  Past Surgical History  Procedure Date  . Varicose veins     surgey 1959  . Tonsilectomy, adenoidectomy, bilateral myringotomy and tubes   . Esophagogastroduodenoscopy 04/01/2011    Procedure: ESOPHAGOGASTRODUODENOSCOPY (EGD);  Surgeon: Barrie Folk, MD;  Location: [facility] ENDOSCOPY;  Service: Endoscopy;  Laterality: N/A;    REVIEW OF SYSTEMS:  Pertinent items are noted in HPI.   PHYSICAL EXAMINATION: General appearance: alert, cooperative, appears stated age, pale Head: Normocephalic, without obvious abnormality, atraumatic Neck: no adenopathy, no carotid bruit, no JVD, supple, symmetrical, trachea midline and thyroid not enlarged, symmetric, no tenderness/mass/nodules Lymph nodes: Cervical, supraclavicular, and axillary nodes normal. Resp: Clear to auscultation bilaterally and normal respiratiory movement.  Back: Symmetric, no curvature. ROM normal. No CVA tenderness. Cardio: Regular rate and rhythm, S1, S2 normal, no murmur, click, rub or gallop and normal apical impulse GI: Soft, non-tender; bowel sounds normal; no masses,  no organomegaly Extremities: Normal, atraumatic, no cyanosis or edema Neurologic:  Normal strength and tone. Normal symmetric reflexes. Normal coordination and gait Psych:  Alert and oriented X 3, ECOG PERFORMANCE STATUS: 2 - Symptomatic, <50% confined to bed  Blood pressure 136/79, pulse 80, temperature 98.4 F (36.9 C), temperature source Oral, height 5\' 5"  (1.651 m), weight 103 lb 14.4 oz (47.129 kg).  LABORATORY DATA: Lab Results  Component Value Date   WBC 5.8 06/04/2011   HGB 8.2* 06/04/2011   HCT 26.1* 06/04/2011   MCV 98.1 06/04/2011   PLT 618 Large & giant platelets* 06/04/2011  Iron studies performed on recent visit showed  total ferritin 500. B12 greater than 2000 and folate normal. Erythropoietin level elevated at 197.  ASSESSMENT: 78 year old female with:  1. Anemia likely secondary to a primary bone marrow process, blood transfusion 2 weeks ago.  PLAN:  1. Return to clinic in 1 week for repeat lab. We will not transfuse today since she is relatively asymptomatic.    All questions were answered. The patient knows to call the clinic with any problems, questions or concerns. We can certainly see the patient much sooner if necessary.  Dr. Welton Flakes and I spent 25 minutes counseling the patient face to face. The total time spent in the appointment was 30 minutes.    Drue Second, MD Medical/Oncology [facility] [phone] (beeper) [phone] (Office)  06/04/2011, 1:29 PM

## 2011-06-05 ENCOUNTER — Ambulatory Visit (INDEPENDENT_AMBULATORY_CARE_PROVIDER_SITE_OTHER): Payer: Medicare Other | Admitting: Family Medicine

## 2011-06-05 DIAGNOSIS — Z23 Encounter for immunization: Secondary | ICD-10-CM

## 2011-06-05 DIAGNOSIS — Z299 Encounter for prophylactic measures, unspecified: Secondary | ICD-10-CM

## 2011-06-05 MED ORDER — TETANUS-DIPHTH-ACELL PERTUSSIS 5-2.5-18.5 LF-MCG/0.5 IM SUSP: 0.5000 mL | Freq: Once | INTRAMUSCULAR | Status: DC

## 2011-06-06 ENCOUNTER — Ambulatory Visit: Payer: Medicare Other | Admitting: Oncology

## 2011-06-11 ENCOUNTER — Encounter (HOSPITAL_COMMUNITY)
Admission: RE | Admit: 2011-06-11 | Discharge: 2011-06-11 | Disposition: A | Discharge status: Home / Self Care | Payer: Medicare Other | Source: Ambulatory Visit | Attending: Oncology | Admitting: Oncology

## 2011-06-11 ENCOUNTER — Other Ambulatory Visit (HOSPITAL_BASED_OUTPATIENT_CLINIC_OR_DEPARTMENT_OTHER): Payer: Medicare Other | Admitting: Lab

## 2011-06-11 ENCOUNTER — Ambulatory Visit: Payer: Medicare Other | Admitting: Lab

## 2011-06-11 ENCOUNTER — Ambulatory Visit (HOSPITAL_BASED_OUTPATIENT_CLINIC_OR_DEPARTMENT_OTHER): Payer: Medicare Other | Admitting: Family

## 2011-06-11 VITALS — BP 147/58 | HR 76 | Temp 98.0°F | Ht 65.0 in | Wt 104.5 lb

## 2011-06-11 DIAGNOSIS — D649 Anemia, unspecified: Secondary | ICD-10-CM

## 2011-06-11 LAB — CBC WITH DIFFERENTIAL/PLATELET
Basophils Absolute: 0 10*3/uL (ref 0.0–0.1)
EOS%: 2.3 % (ref 0.0–7.0)
Eosinophils Absolute: 0.2 10*3/uL (ref 0.0–0.5)
HCT: 23.3 % — ABNORMAL LOW (ref 34.8–46.6)
HGB: 7.5 g/dL — ABNORMAL LOW (ref 11.6–15.9)
MCH: 31.1 pg (ref 25.1–34.0)
MONO#: 0 10*3/uL — ABNORMAL LOW (ref 0.1–0.9)
NEUT#: 7.2 10*3/uL — ABNORMAL HIGH (ref 1.5–6.5)
NEUT%: 83 % — ABNORMAL HIGH (ref 38.4–76.8)
lymph#: 1.2 10*3/uL (ref 0.9–3.3)

## 2011-06-11 LAB — TECHNOLOGIST REVIEW

## 2011-06-11 LAB — HOLD TUBE, BLOOD BANK

## 2011-06-12 ENCOUNTER — Telehealth: Payer: Self-pay | Admitting: Oncology

## 2011-06-12 ENCOUNTER — Encounter: Payer: Self-pay | Admitting: Family

## 2011-06-12 NOTE — Progress Notes (Signed)
OFFICE PROGRESS NOTE  CC: [name], MD [address] [place] [address]  DIAGNOSIS:  Profound anemia likely due to a primary bone marrow problem. Recent bone marrow biopsy shows fibrosis.   PRIOR THERAPY: 1. Two units PRBC's January 22.  2. Bone marrow biopsy and aspirate 05/28/2011.  CURRENT THERAPY:Observation. Blood transfusions as needed.   INTERVAL HISTORY: Last seen by me 06/04/11, hgb 8.2. Last week, began to be fatigued with dyspnea on exertion. No pain. Reports irritable bowel syndrome episodes with diarrhea chronically. Is diabetic. No blood in the stool. No nausea, vomiting, fevers, chills, night sweats, headaches, chest pains or palpitations. Appetite is good. Remainder of the 10 point review of systems is negative.  MEDICAL HISTORY: Past Medical History  Diagnosis Date  . Hypertension   . Diabetes mellitus   . Hyperkalemia   . IBS (irritable bowel syndrome)   . Hypercholesterolemia   . Cervical radiculopathy     ALLERGIES:  Amoxicillin; cephalexin; metformin and related; norvasc; phenergan; and procardia.  MEDICATIONS:  Current Outpatient Prescriptions  Medication Sig Dispense Refill  . amLODipine (NORVASC) 5 MG tablet Take 1 tablet (5 mg total) by mouth daily.  90 tablet  3  . Ascorbic Acid (VITAMIN C) 500 MG tablet Take 500 mg by mouth daily.        Marland Kitchen aspirin 81 MG tablet Take 81 mg by mouth daily.        Marland Kitchen glimepiride (AMARYL) 2 MG tablet Take 1 tablet (2 mg total) by mouth every morning.  30 tablet  11  . glucose blood test strip Twice daily as instructed  100 each  3  . Lancets (ACCU-CHEK SOFT TOUCH) lancets Use as instructed twice daily  100 each  12  . metoprolol (LOPRESSOR) 50 MG tablet Take 1 tablet (50 mg total) by mouth 2 (two) times daily.  180 tablet  3  . mometasone (ELOCON) 0.1 % lotion Apply 1 application topically as needed.       . Omega-3 Fatty Acids (FISH OIL) 1000 MG CAPS Take 1,000 mg by mouth daily.       .  simvastatin (ZOCOR) 5 MG tablet Take 1 tablet (5 mg total) by mouth at bedtime.  90 tablet  1  . traMADol (ULTRAM) 50 MG tablet Take 50 mg by mouth every 6 (six) hours as needed. For pain.       SURGICAL HISTORY:  Past Surgical History  Procedure Date  . Varicose veins     surgey 1959  . Tonsilectomy, adenoidectomy, bilateral myringotomy and tubes   . Esophagogastroduodenoscopy 04/01/2011    Procedure: ESOPHAGOGASTRODUODENOSCOPY (EGD);  Surgeon: Barrie Folk, MD;  Location: [facility] ENDOSCOPY;  Service: Endoscopy;  Laterality: N/A;    REVIEW OF SYSTEMS:  Pertinent items are noted in HPI.   PHYSICAL EXAMINATION: General appearance: alert, cooperative, appears stated age, pale Head: Normocephalic, without obvious abnormality, atraumatic Neck: no adenopathy, no carotid bruit, no JVD, supple, symmetrical, trachea midline and thyroid not enlarged, symmetric, no tenderness/mass/nodules Lymph nodes: Cervical, supraclavicular, and axillary nodes normal. Resp: Clear to auscultation bilaterally and normal respiratiory movement.  Back: Symmetric, no curvature. ROM normal. No CVA tenderness. Cardio: Regular rate and rhythm, S1, S2 normal, no murmur, click, rub or gallop and normal apical impulse GI: Soft, non-tender; bowel sounds normal; no masses,  no organomegaly Extremities: Normal, atraumatic, no cyanosis or edema Neurologic:  Normal strength and tone. Normal symmetric reflexes. Normal coordination and gait Psych: Alert and oriented X 3, ECOG PERFORMANCE  STATUS: 2 - Symptomatic, <50% confined to bed  Blood pressure 147/58, pulse 76, temperature 98 F (36.7 C), temperature source Oral, height 5\' 5"  (1.651 m), weight 104 lb 8 oz (47.401 kg).  LABORATORY DATA: Lab Results  Component Value Date   WBC 8.7 06/11/2011   HGB 7.5* 06/11/2011   HCT 23.3* 06/11/2011   MCV 96.7 06/11/2011   PLT 621* 06/11/2011  Iron studies performed on recent visit showed total ferritin 500. B12 greater than 2000 and folate  normal. Erythropoietin level elevated at 197.  ASSESSMENT: 78 year old female with:  1. Anemia likely secondary to a primary bone marrow process, last blood transfusion 05/21/11.  PLAN:  1. Type and cross for transfusion in the next few days, she is symptomatic.  2. Return in 2 weeks for lab and appt with Dr. Welton Flakes.   All questions were answered. The patient knows to call the clinic with any problems, questions or concerns. We can certainly see the patient much sooner if necessary.

## 2011-06-12 NOTE — Telephone Encounter (Signed)
S/w the pt's dtr and she is aware of the 2 week f/u appt

## 2011-06-14 ENCOUNTER — Ambulatory Visit (HOSPITAL_BASED_OUTPATIENT_CLINIC_OR_DEPARTMENT_OTHER): Payer: Medicare Other

## 2011-06-14 VITALS — BP 154/65 | HR 75 | Temp 98.1°F | Resp 16

## 2011-06-14 DIAGNOSIS — D649 Anemia, unspecified: Secondary | ICD-10-CM

## 2011-06-14 MED ORDER — DIPHENHYDRAMINE HCL 50 MG/ML IJ SOLN
25.0000 mg | Freq: Once | INTRAMUSCULAR | Status: AC
  Administered 2011-06-14: 25 mg via INTRAVENOUS

## 2011-06-14 MED ORDER — ACETAMINOPHEN 325 MG PO TABS
650.0000 mg | ORAL_TABLET | Freq: Once | ORAL | Status: AC
  Administered 2011-06-14: 650 mg via ORAL

## 2011-06-14 NOTE — Patient Instructions (Signed)
Instructed to call if any problems. Pt knows when next appt is

## 2011-06-15 LAB — TYPE AND SCREEN
ABO/RH(D): AB NEG
Antibody Screen: NEGATIVE
Unit division: 0

## 2011-06-25 ENCOUNTER — Ambulatory Visit: Payer: Medicare Other | Admitting: Lab

## 2011-06-25 ENCOUNTER — Encounter: Payer: Self-pay | Admitting: Family

## 2011-06-25 ENCOUNTER — Telehealth: Payer: Self-pay | Admitting: Oncology

## 2011-06-25 ENCOUNTER — Ambulatory Visit (HOSPITAL_BASED_OUTPATIENT_CLINIC_OR_DEPARTMENT_OTHER): Payer: Medicare Other | Admitting: Family

## 2011-06-25 VITALS — BP 163/54 | HR 80 | Temp 98.8°F | Ht 65.0 in | Wt 104.0 lb

## 2011-06-25 DIAGNOSIS — D649 Anemia, unspecified: Secondary | ICD-10-CM

## 2011-06-25 LAB — CBC & DIFF AND RETIC
EOS%: 4.3 % (ref 0.0–7.0)
Eosinophils Absolute: 0.3 10*3/uL (ref 0.0–0.5)
LYMPH%: 25.4 % (ref 14.0–49.7)
MCH: 30.7 pg (ref 25.1–34.0)
MCV: 95.3 fL (ref 79.5–101.0)
MONO%: 0.5 % (ref 0.0–14.0)
Platelets: 508 10*3/uL — ABNORMAL HIGH (ref 145–400)
RBC: 3.19 10*6/uL — ABNORMAL LOW (ref 3.70–5.45)
RDW: 17.4 % — ABNORMAL HIGH (ref 11.2–14.5)
Retic %: 0.98 % (ref 0.70–2.10)
nRBC: 0 % (ref 0–0)

## 2011-06-25 LAB — TECHNOLOGIST REVIEW

## 2011-06-25 NOTE — Progress Notes (Signed)
OFFICE PROGRESS NOTE  CC: [name], MD [address] [place] [address]  DIAGNOSIS:  Profound anemia likely due to a primary bone marrow problem. Recent bone marrow biopsy shows fibrosis.   PRIOR THERAPY: 1. Two units PRBC's January 22.  2. Bone marrow biopsy and aspirate 05/28/2011.  CURRENT THERAPY:Observation. Blood transfusions as needed.   INTERVAL HISTORY: Dyspnea on exertion slightly improved after 2 units PRBC's 06/13/12. No pain. Reports irritable bowel syndrome episodes with diarrhea chronically. No blood in the stool. Diabetic, no nausea, vomiting, fevers, chills, night sweats, headaches, chest pains or palpitations. Appetite is good. Remainder of the 10 point review of systems is negative.  MEDICAL HISTORY: Past Medical History  Diagnosis Date  . Hypertension   . Diabetes mellitus   . Hyperkalemia   . IBS (irritable bowel syndrome)   . Hypercholesterolemia   . Cervical radiculopathy   . Anemia     ALLERGIES:  Amoxicillin; cephalexin; metformin and related; norvasc; phenergan; and procardia.  MEDICATIONS:  Current Outpatient Prescriptions  Medication Sig Dispense Refill  . amLODipine (NORVASC) 5 MG tablet Take 1 tablet (5 mg total) by mouth daily.  90 tablet  3  . Ascorbic Acid (VITAMIN C) 500 MG tablet Take 500 mg by mouth daily.        Marland Kitchen aspirin 81 MG tablet Take 81 mg by mouth daily.        Marland Kitchen glimepiride (AMARYL) 2 MG tablet Take 1 tablet (2 mg total) by mouth every morning.  30 tablet  11  . glucose blood test strip Twice daily as instructed  100 each  3  . Lancets (ACCU-CHEK SOFT TOUCH) lancets Use as instructed twice daily  100 each  12  . metoprolol (LOPRESSOR) 50 MG tablet Take 1 tablet (50 mg total) by mouth 2 (two) times daily.  180 tablet  3  . mometasone (ELOCON) 0.1 % lotion Apply 1 application topically as needed.       . Omega-3 Fatty Acids (FISH OIL) 1000 MG CAPS Take 1,000 mg by mouth daily.       . simvastatin (ZOCOR) 5  MG tablet Take 1 tablet (5 mg total) by mouth at bedtime.  90 tablet  1  . traMADol (ULTRAM) 50 MG tablet Take 50 mg by mouth every 6 (six) hours as needed. For pain.       REVIEW OF SYSTEMS:  Pertinent items are noted in HPI.   PHYSICAL EXAMINATION: General appearance: alert, cooperative, appears stated age, pale. Accompanied by son and daughter. Head: Normocephalic, without obvious abnormality, atraumatic Neck: no adenopathy, no carotid bruit, no JVD, supple, symmetrical, trachea midline and thyroid not enlarged, symmetric, no tenderness/mass/nodules Lymph nodes: Cervical, supraclavicular, and axillary nodes normal. Resp: Clear to auscultation bilaterally and normal respiratiory movement.  Back: Symmetric, no curvature. ROM normal. No CVA tenderness. Cardio: Regular rate and rhythm, S1, S2 normal, no murmur, click, rub or gallop and normal apical impulse GI: Soft, non-tender; bowel sounds normal; no masses,  no organomegaly Extremities: Normal, atraumatic, no cyanosis or edema Neurologic:  Normal strength and tone. Normal symmetric reflexes. Normal coordination and gait Psych: Alert and oriented X 3, ECOG PERFORMANCE STATUS: 2 - Symptomatic, <50% confined to bed  Blood pressure 163/54, pulse 80, temperature 98.8 F (37.1 C), temperature source Oral, height 5\' 5"  (1.651 m), weight 104 lb (47.174 kg).  LABORATORY DATA: Lab Results  Component Value Date   WBC 5.8 06/25/2011   HGB 9.8* 06/25/2011   HCT 30.4* 06/25/2011  MCV 95.3 06/25/2011   PLT 508* 06/25/2011    ASSESSMENT: 78 year old female with:  1. Anemia likely secondary to a primary bone marrow process, last blood transfusion 06/14/11.  PLAN:  1.. Return in 3 weeks for lab and appt with Dr. Welton Flakes. 2. Check renal function on next visit to rule out as contributing factor to anemia.    All questions were answered. The patient knows to call the clinic with any problems, questions or concerns. We can certainly see the patient much  sooner if necessary.

## 2011-06-25 NOTE — Patient Instructions (Signed)
Return in 3 weeks. Call the office sooner for significant fatigue or shortness of breath for expedited appointment.

## 2011-06-25 NOTE — Telephone Encounter (Signed)
gve the pt her march 2013 appt calendar 

## 2011-07-17 ENCOUNTER — Telehealth: Payer: Self-pay | Admitting: *Deleted

## 2011-07-17 ENCOUNTER — Encounter: Payer: Self-pay | Admitting: Oncology

## 2011-07-17 ENCOUNTER — Ambulatory Visit: Payer: Medicare Other | Admitting: Lab

## 2011-07-17 ENCOUNTER — Other Ambulatory Visit (HOSPITAL_BASED_OUTPATIENT_CLINIC_OR_DEPARTMENT_OTHER): Payer: Medicare Other | Admitting: Lab

## 2011-07-17 ENCOUNTER — Ambulatory Visit (HOSPITAL_BASED_OUTPATIENT_CLINIC_OR_DEPARTMENT_OTHER): Payer: Medicare Other

## 2011-07-17 ENCOUNTER — Ambulatory Visit (HOSPITAL_BASED_OUTPATIENT_CLINIC_OR_DEPARTMENT_OTHER): Payer: Medicare Other | Admitting: Oncology

## 2011-07-17 ENCOUNTER — Encounter (HOSPITAL_COMMUNITY)
Admission: RE | Admit: 2011-07-17 | Discharge: 2011-07-17 | Disposition: A | Discharge status: Home / Self Care | Payer: Medicare Other | Source: Ambulatory Visit | Attending: Oncology | Admitting: Oncology

## 2011-07-17 ENCOUNTER — Other Ambulatory Visit: Payer: Self-pay | Admitting: Medical Oncology

## 2011-07-17 VITALS — BP 134/64 | HR 80 | Temp 97.6°F | Ht 65.0 in | Wt 105.7 lb

## 2011-07-17 VITALS — BP 156/55 | HR 71 | Temp 97.9°F | Resp 18

## 2011-07-17 DIAGNOSIS — E119 Type 2 diabetes mellitus without complications: Secondary | ICD-10-CM

## 2011-07-17 DIAGNOSIS — D649 Anemia, unspecified: Secondary | ICD-10-CM | POA: Insufficient documentation

## 2011-07-17 DIAGNOSIS — D4620 Refractory anemia with excess of blasts, unspecified: Secondary | ICD-10-CM

## 2011-07-17 LAB — CBC WITH DIFFERENTIAL/PLATELET
Basophils Absolute: 0 10*3/uL (ref 0.0–0.1)
HCT: 23.1 % — ABNORMAL LOW (ref 34.8–46.6)
HGB: 7.4 g/dL — ABNORMAL LOW (ref 11.6–15.9)
LYMPH%: 15.5 % (ref 14.0–49.7)
MCHC: 32 g/dL (ref 31.5–36.0)
MONO#: 0 10*3/uL — ABNORMAL LOW (ref 0.1–0.9)
NEUT%: 80.4 % — ABNORMAL HIGH (ref 38.4–76.8)
Platelets: 694 10*3/uL — ABNORMAL HIGH (ref 145–400)
WBC: 5.8 10*3/uL (ref 3.9–10.3)
lymph#: 0.9 10*3/uL (ref 0.9–3.3)

## 2011-07-17 LAB — BASIC METABOLIC PANEL
BUN: 16 mg/dL (ref 6–23)
CO2: 27 mEq/L (ref 19–32)
Chloride: 101 mEq/L (ref 96–112)
Creatinine, Ser: 0.75 mg/dL (ref 0.50–1.10)
Glucose, Bld: 266 mg/dL — ABNORMAL HIGH (ref 70–99)
Potassium: 5.7 mEq/L — ABNORMAL HIGH (ref 3.5–5.3)

## 2011-07-17 LAB — TYPE & CROSSMATCH - CHCC

## 2011-07-17 MED ORDER — ACETAMINOPHEN 325 MG PO TABS
650.0000 mg | ORAL_TABLET | Freq: Once | ORAL | Status: AC
  Administered 2011-07-17: 650 mg via ORAL

## 2011-07-17 MED ORDER — SODIUM CHLORIDE 0.9 % IV SOLN
250.0000 mL | Freq: Once | INTRAVENOUS | Status: AC
  Administered 2011-07-17: 250 mL via INTRAVENOUS

## 2011-07-17 MED ORDER — FUROSEMIDE 10 MG/ML IJ SOLN: 20.0000 mg | Freq: Once | INTRAMUSCULAR | Status: DC

## 2011-07-17 MED ORDER — DIPHENHYDRAMINE HCL 25 MG PO CAPS
25.0000 mg | ORAL_CAPSULE | Freq: Once | ORAL | Status: AC
  Administered 2011-07-17: 25 mg via ORAL

## 2011-07-17 NOTE — Telephone Encounter (Signed)
error 

## 2011-07-17 NOTE — Telephone Encounter (Signed)
placed on the lab on list patient needs to be type and crossed today patient is recieving blood transions on 07-17-2011

## 2011-07-17 NOTE — Progress Notes (Signed)
OFFICE PROGRESS NOTE  CC  [name], MD, MD [address] [place] [address]  DIAGNOSIS: 78-year-old female with severe and profound anemia likely due to a primary bone marrow problem. Differential does include myelodysplastic syndrome and anemia of chronic disease or  PRIOR THERAPY:  #1 patient is status post packed red cell transfusion during recent hospitalization.  CURRENT THERAPY: supportive  blood transfusions INTERVAL HISTORY: Meredith Rodriguez 78 y.o. female returns for Followup visit today.Patient is tired today. She has not had any kind of bleeding. She tells me that her blood sugars have been running high and she is concerned about this. She is monitoring her blood sugars very carefully and follows a very strict diet. Remainder of the 10 point review of systems is negative.  MEDICAL HISTORY: Past Medical History  Diagnosis Date  . Hypertension   . Diabetes mellitus   . Hyperkalemia   . IBS (irritable bowel syndrome)   . Hypercholesterolemia   . Cervical radiculopathy   . Anemia     ALLERGIES:  is allergic to amoxicillin; cephalexin; metformin and related; norvasc; phenergan; and procardia.  MEDICATIONS:  Current Outpatient Prescriptions  Medication Sig Dispense Refill  . amLODipine (NORVASC) 5 MG tablet Take 1 tablet (5 mg total) by mouth daily.  90 tablet  3  . Ascorbic Acid (VITAMIN C) 500 MG tablet Take 500 mg by mouth daily.        Marland Kitchen aspirin 81 MG tablet Take 81 mg by mouth daily.        Marland Kitchen estrogens, conjugated, (PREMARIN) 0.3 MG tablet Take 0.3 mg by mouth daily. Take daily for 21 days then do not take for 7 days.      Marland Kitchen glucose blood test strip Twice daily as instructed  100 each  3  . Lancets (ACCU-CHEK SOFT TOUCH) lancets Use as instructed twice daily  100 each  12  . metoprolol (LOPRESSOR) 50 MG tablet Take 1 tablet (50 mg total) by mouth 2 (two) times daily.  180 tablet  3  . mometasone (ELOCON) 0.1 % lotion Apply 1 application topically as  needed.       . Omega-3 Fatty Acids (FISH OIL) 1000 MG CAPS Take 1,000 mg by mouth daily.       . simvastatin (ZOCOR) 5 MG tablet Take 1 tablet (5 mg total) by mouth at bedtime.  90 tablet  1  . traMADol (ULTRAM) 50 MG tablet Take 50 mg by mouth every 6 (six) hours as needed. For pain.       Current Facility-Administered Medications  Medication Dose Route Frequency Provider Last Rate Last Dose  . TDaP (BOOSTRIX) injection 0.5 mL  0.5 mL Intramuscular Once [name], MD        SURGICAL HISTORY:  Past Surgical History  Procedure Date  . Varicose veins     surgey 1959  . Tonsilectomy, adenoidectomy, bilateral myringotomy and tubes   . Esophagogastroduodenoscopy 04/01/2011    Procedure: ESOPHAGOGASTRODUODENOSCOPY (EGD);  Surgeon: Barrie Folk, MD;  Location: [facility] ENDOSCOPY;  Service: Endoscopy;  Laterality: N/A;    REVIEW OF SYSTEMS:  Pertinent items are noted in HPI.   PHYSICAL EXAMINATION: General appearance: alert, cooperative, appears stated age, flushed, mild distress and pale Head: Normocephalic, without obvious abnormality, atraumatic Neck: no adenopathy, no carotid bruit, no JVD, supple, symmetrical, trachea midline and thyroid not enlarged, symmetric, no tenderness/mass/nodules Lymph nodes: Cervical, supraclavicular, and axillary nodes normal. Resp: clear to auscultation bilaterally and normal percussion bilaterally Back: symmetric,  no curvature. ROM normal. No CVA tenderness. Cardio: regular rate and rhythm, S1, S2 normal, no murmur, click, rub or gallop and normal apical impulse GI: soft, non-tender; bowel sounds normal; no masses,  no organomegaly Extremities: extremities normal, atraumatic, no cyanosis or edema Neurologic: Alert and oriented X 3, normal strength and tone. Normal symmetric reflexes. Normal coordination and gait  ECOG PERFORMANCE STATUS: 2 - Symptomatic, <50% confined to bed  Blood pressure 134/64, pulse 80, temperature 97.6 F (36.4 C), height 5\' 5"   (1.651 m), weight 105 lb 11.2 oz (47.945 kg).  LABORATORY DATA: Lab Results  Component Value Date   WBC 5.8 07/17/2011   HGB 7.4* 07/17/2011   HCT 23.1* 07/17/2011   MCV 95.5 07/17/2011   PLT 694* 07/17/2011      Chemistry      Component Value Date/Time   NA 138 04/18/2011 1349   K 5.0 04/18/2011 1349   CL 101 04/18/2011 1349   CO2 28 04/18/2011 1349   BUN 17 04/18/2011 1349   CREATININE 0.68 04/18/2011 1349   CREATININE 0.69 07/18/2010 1202      Component Value Date/Time   CALCIUM 10.1 04/18/2011 1349   ALKPHOS 28* 04/18/2011 1349   AST 22 04/18/2011 1349   ALT 17 04/18/2011 1349   BILITOT 0.8 04/18/2011 1349       RADIOGRAPHIC STUDIES:  No results found.  ASSESSMENT: 78 year old female with:  1.  severe anemia due to primary refractory anemia Patient is receiving supportive therapy. Declined ESA support (aranesp)  2. Patient and family aware that she is at risk for iron overload and that we will continue to monitor this.  3. Diabetes  PLAN:    1. we will transfuse 2 units of packed red cells for symptomatic anemia.   2. We will check your iron studies on your next visit.  3. Please follow up with your primary care MD for your diabetes  4. I will se you back on 08/16/11   5. Call with any problems or concerns at [phone] and ask for Dr. Rosine Beat nurse.  All questions were answered. The patient knows to call the clinic with any problems, questions or concerns. We can certainly see the patient much sooner if necessary.  I spent 25 minutes counseling the patient face to face. The total time spent in the appointment was 30 minutes.    Drue Second, MD Medical/Oncology [facility] [phone] (beeper) [phone] (Office)  07/17/2011, 10:04 AM

## 2011-07-17 NOTE — Patient Instructions (Signed)
1. You will receive a blood transfusion today.  2. We will check your iron studies on your next visit.  3. Please follow up with your primary care MD for your diabetes  4. I will se you back on 08/16/11   5. Call with any problems or concerns at [phone] and ask for Dr. Rosine Beat nurse.

## 2011-07-17 NOTE — Telephone Encounter (Signed)
called patient confirmed over the phone the new date and time of the chemo class on 07-18-2011 arrival 10:30a, patient will pick up her schdule for the injections after the chemo class

## 2011-07-18 LAB — TYPE AND SCREEN: Unit division: 0

## 2011-07-19 ENCOUNTER — Telehealth: Payer: Self-pay | Admitting: *Deleted

## 2011-07-19 NOTE — Telephone Encounter (Signed)
Pt states she has an appt on 4/18 for a follow up.  However her blood sugars have been running high and wants to know if she can come in some time next week.  She had a blood transfusion yesterday and Dr. Park Breed suggested she call and let Dr. Caryl Never know about her sugar levels running high.

## 2011-07-19 NOTE — Telephone Encounter (Signed)
Yes-let's get in next week.

## 2011-07-19 NOTE — Telephone Encounter (Signed)
Please advise 

## 2011-07-22 NOTE — Telephone Encounter (Signed)
Pt is sch for 07-25-2011 1045am

## 2011-07-22 NOTE — Telephone Encounter (Signed)
lmom for pt to call back and schedule

## 2011-07-22 NOTE — Telephone Encounter (Signed)
Meredith Rodriguez, would you be so kind as to schedule this pt for sometime next week.  Thank you

## 2011-07-25 ENCOUNTER — Encounter: Payer: Self-pay | Admitting: Family Medicine

## 2011-07-25 ENCOUNTER — Ambulatory Visit (INDEPENDENT_AMBULATORY_CARE_PROVIDER_SITE_OTHER): Payer: Medicare Other | Admitting: Family Medicine

## 2011-07-25 VITALS — BP 140/68 | Temp 98.2°F | Wt 105.0 lb

## 2011-07-25 DIAGNOSIS — I10 Essential (primary) hypertension: Secondary | ICD-10-CM

## 2011-07-25 DIAGNOSIS — E119 Type 2 diabetes mellitus without complications: Secondary | ICD-10-CM

## 2011-07-25 DIAGNOSIS — D649 Anemia, unspecified: Secondary | ICD-10-CM

## 2011-07-25 NOTE — Progress Notes (Signed)
  Subjective:    Patient ID: Meredith Rodriguez, female    DOB: [birth year], 78 y.o.   MRN: [id number]  HPI  Patient is seen for medical followup. She has history of type 2 diabetes, hypertension, hyperlipidemia, irritable bowel syndrome, and anemia. She has been followed closely by hematology. Had several transfusions. Probable myelodysplasia. She had EGD back in December of 2012 which was unremarkable. She's had full evaluation including blood work and bone marrow biopsy. She generally feels better following transfusions with less fatigue. No recent syncope or dizziness.  She's had some recent increase in blood sugars occasionally over 200. No symptoms of hyperglycemia. No recent A1c. Briefly was on Amaryl 2 mg daily but did not see much improvement in blood sugars. She has not been on other medical treatments for diabetes previously  Past Medical History  Diagnosis Date  . Hypertension   . Diabetes mellitus   . Hyperkalemia   . IBS (irritable bowel syndrome)   . Hypercholesterolemia   . Cervical radiculopathy   . Anemia    Past Surgical History  Procedure Date  . Varicose veins     surgey 1959  . Tonsilectomy, adenoidectomy, bilateral myringotomy and tubes   . Esophagogastroduodenoscopy 04/01/2011    Procedure: ESOPHAGOGASTRODUODENOSCOPY (EGD);  Surgeon: Barrie Folk, MD;  Location: [facility] ENDOSCOPY;  Service: Endoscopy;  Laterality: N/A;    reports that she has never smoked. She does not have any smokeless tobacco history on file. She reports that she does not drink alcohol or use illicit drugs. family history includes Cancer in her daughter and sisters; Diabetes in her sister; Hypertension in her father; and Stroke in her mother. Allergies  Allergen Reactions  . Amoxicillin (Amoxil)     Upset stomach  . Cephalexin Nausea Only  . Metformin And Related     Gi problems  . Phenergan (Promethazine Hcl)   . Procardia (Nifedipine)     dizziness      Review of Systems  Constitutional:  Positive for fatigue.  Respiratory: Negative for cough and shortness of breath.   Cardiovascular: Negative for chest pain, palpitations and leg swelling.  Gastrointestinal: Negative for abdominal pain and blood in stool.  Genitourinary: Negative for dysuria.  Neurological: Positive for weakness. Negative for dizziness and syncope.  Hematological: Negative for adenopathy. Does not bruise/bleed easily.       Objective:   Physical Exam  Constitutional: She appears well-developed and well-nourished.  HENT:  Mouth/Throat: Oropharynx is clear and moist.  Neck: Neck supple.  Cardiovascular: Normal rate and regular rhythm.   Pulmonary/Chest: Effort normal and breath sounds normal. No respiratory distress. She has no wheezes. She has no rales.  Musculoskeletal: She exhibits no edema.  Lymphadenopathy:    She has no cervical adenopathy.  Psychiatric: She has a normal mood and affect. Her behavior is normal.          Assessment & Plan:  #1 history of diabetes. Poor control by home readings. She forgot to bring in readings today. Repeat A1c. Consider low-dose long-acting insulin if significantly elevated  #2 anemia with probable bone marrow disorder. Continue close hematology followup  #3 hypertension stable

## 2011-07-29 NOTE — Progress Notes (Signed)
Quick Note:  Pt informed on VM, copy mailed to pt home ______ 

## 2011-08-01 LAB — HM MAMMOGRAPHY: HM Mammogram: NEGATIVE

## 2011-08-02 ENCOUNTER — Encounter: Payer: Self-pay | Admitting: Family Medicine

## 2011-08-09 ENCOUNTER — Telehealth: Payer: Self-pay | Admitting: *Deleted

## 2011-08-09 NOTE — Telephone Encounter (Signed)
PT. HAS BEEN TIRED FOR TWO TO THREE DAYS. NO ACTIVE BLEEDING. SHE GETS SHORT OF BREATH WITH EXERTION. VERBAL ORDER AND READ BACK TO DR.RUBIN- PT. SHOULD FORCE FLUIDS AND REST. IF CONDITION WORSEN PT. NEEDS TO GO TO THE EMERGENCY ROOM. ON Monday CALL DR.KHAN'S OFFICE WITH AN UPDATE. DR.KHAN'S NURSE, [facility] GARNER.RN WILL NOTIFY PT. WITH THE ABOVE INSTRUCTIONS.

## 2011-08-09 NOTE — Telephone Encounter (Signed)
Triage here regarding pt call c/o weak/tired. Pt last seen 07/17/11. Next MD appt is 04/19. Reviewed with Dr. Donnie Coffin in Dr. Milta Deiters absence Per Dr. Donnie Coffin, notified pt get get plenty of rest, fluids, eat healthy as history of high sugars.  If her symptoms worsen pt is to be further evaluated at ED. Pt to call back on Monday to advise how she is feeling.  Pt verbalized understanding the instructions given by Dr. Donnie Coffin and confirmed she would follow them.

## 2011-08-15 ENCOUNTER — Ambulatory Visit: Payer: Medicare Other | Admitting: Family Medicine

## 2011-08-16 ENCOUNTER — Encounter: Payer: Self-pay | Admitting: Oncology

## 2011-08-16 ENCOUNTER — Other Ambulatory Visit (HOSPITAL_BASED_OUTPATIENT_CLINIC_OR_DEPARTMENT_OTHER): Payer: Medicare Other | Admitting: Lab

## 2011-08-16 ENCOUNTER — Ambulatory Visit (HOSPITAL_BASED_OUTPATIENT_CLINIC_OR_DEPARTMENT_OTHER): Payer: Medicare Other | Admitting: Oncology

## 2011-08-16 ENCOUNTER — Other Ambulatory Visit: Payer: Self-pay | Admitting: *Deleted

## 2011-08-16 VITALS — BP 144/62 | HR 74 | Temp 98.2°F | Ht 65.0 in | Wt 104.3 lb

## 2011-08-16 DIAGNOSIS — D649 Anemia, unspecified: Secondary | ICD-10-CM

## 2011-08-16 DIAGNOSIS — R5383 Other fatigue: Secondary | ICD-10-CM

## 2011-08-16 DIAGNOSIS — E119 Type 2 diabetes mellitus without complications: Secondary | ICD-10-CM

## 2011-08-16 DIAGNOSIS — D4620 Refractory anemia with excess of blasts, unspecified: Secondary | ICD-10-CM

## 2011-08-16 LAB — CBC WITH DIFFERENTIAL/PLATELET
BASO%: 0.3 % (ref 0.0–2.0)
Eosinophils Absolute: 0.2 10*3/uL (ref 0.0–0.5)
HCT: 23.6 % — ABNORMAL LOW (ref 34.8–46.6)
LYMPH%: 14.8 % (ref 14.0–49.7)
MCHC: 32.2 g/dL (ref 31.5–36.0)
MONO#: 0 10*3/uL — ABNORMAL LOW (ref 0.1–0.9)
NEUT%: 81.5 % — ABNORMAL HIGH (ref 38.4–76.8)
Platelets: 869 10*3/uL — ABNORMAL HIGH (ref 145–400)
WBC: 7.2 10*3/uL (ref 3.9–10.3)

## 2011-08-16 LAB — IRON AND TIBC: UIBC: <15 ug/dL — ABNORMAL LOW (ref 125–400)

## 2011-08-16 LAB — TECHNOLOGIST REVIEW

## 2011-08-16 NOTE — Patient Instructions (Signed)
1. You need a blood transfusion. We will make the arrangements for next week. You will get call from the office  2. I will see you back in 1 months time

## 2011-08-16 NOTE — Progress Notes (Signed)
OFFICE PROGRESS NOTE  CC  [name], MD, MD [address] [place] [address]  DIAGNOSIS: 78 year old female with severe and profound anemia likely due to a primary bone marrow problem. Differential does include myelodysplastic syndrome and anemia of chronic disease or  PRIOR THERAPY:  #1 patient is status post packed red cell transfusion during recent hospitalization.  CURRENT THERAPY: supportive  blood transfusions  INTERVAL HISTORY: Meredith Rodriguez 78 y.o. female returns for Followup visit today.Patient is tired today. She has not had any kind of bleeding. She tells me that her blood sugars have been running high and she is concerned about this. She is monitoring her blood sugars very carefully and follows a very strict diet. Remainder of the 10 point review of systems is negative.  MEDICAL HISTORY: Past Medical History  Diagnosis Date  . Hypertension   . Diabetes mellitus   . Hyperkalemia   . IBS (irritable bowel syndrome)   . Hypercholesterolemia   . Cervical radiculopathy   . Anemia     ALLERGIES:  is allergic to amoxicillin; cephalexin; metformin and related; phenergan; and procardia.  MEDICATIONS:  Current Outpatient Prescriptions  Medication Sig Dispense Refill  . amLODipine (NORVASC) 5 MG tablet Take 1 tablet (5 mg total) by mouth daily.  90 tablet  3  . Ascorbic Acid (VITAMIN C) 500 MG tablet Take 500 mg by mouth daily.        Marland Kitchen aspirin 81 MG tablet Take 81 mg by mouth daily.        Marland Kitchen estrogens, conjugated, (PREMARIN) 0.3 MG tablet Take 0.3 mg by mouth daily. Take daily for 21 days then do not take for 7 days.      Marland Kitchen glucose blood test strip Twice daily as instructed  100 each  3  . Lancets (ACCU-CHEK SOFT TOUCH) lancets Use as instructed twice daily  100 each  12  . metoprolol (LOPRESSOR) 50 MG tablet Take 1 tablet (50 mg total) by mouth 2 (two) times daily.  180 tablet  3  . mometasone (ELOCON) 0.1 % lotion Apply 1 application topically as needed.        . Omega-3 Fatty Acids (FISH OIL) 1000 MG CAPS Take 1,000 mg by mouth daily.       . simvastatin (ZOCOR) 5 MG tablet Take 1 tablet (5 mg total) by mouth at bedtime.  90 tablet  1  . traMADol (ULTRAM) 50 MG tablet Take 50 mg by mouth every 6 (six) hours as needed. For pain.      Marland Kitchen DISCONTD: glimepiride (AMARYL) 2 MG tablet Take 1 tablet (2 mg total) by mouth every morning.  30 tablet  11   Current Facility-Administered Medications  Medication Dose Route Frequency Provider Last Rate Last Dose  . TDaP (BOOSTRIX) injection 0.5 mL  0.5 mL Intramuscular Once [name], MD        SURGICAL HISTORY:  Past Surgical History  Procedure Date  . Varicose veins     surgey 1959  . Tonsilectomy, adenoidectomy, bilateral myringotomy and tubes   . Esophagogastroduodenoscopy 04/01/2011    Procedure: ESOPHAGOGASTRODUODENOSCOPY (EGD);  Surgeon: Barrie Folk, MD;  Location: [facility] ENDOSCOPY;  Service: Endoscopy;  Laterality: N/A;    REVIEW OF SYSTEMS:  Pertinent items are noted in HPI.   PHYSICAL EXAMINATION: General appearance: alert, cooperative, appears stated age, flushed, mild distress and pale Head: Normocephalic, without obvious abnormality, atraumatic Neck: no adenopathy, no carotid bruit, no JVD, supple, symmetrical, trachea midline and thyroid not  enlarged, symmetric, no tenderness/mass/nodules Lymph nodes: Cervical, supraclavicular, and axillary nodes normal. Resp: clear to auscultation bilaterally and normal percussion bilaterally Back: symmetric, no curvature. ROM normal. No CVA tenderness. Cardio: regular rate and rhythm, S1, S2 normal, no murmur, click, rub or gallop and normal apical impulse GI: soft, non-tender; bowel sounds normal; no masses,  no organomegaly Extremities: extremities normal, atraumatic, no cyanosis or edema Neurologic: Alert and oriented X 3, normal strength and tone. Normal symmetric reflexes. Normal coordination and gait  ECOG PERFORMANCE STATUS: 2 -  Symptomatic, <50% confined to bed  Blood pressure 144/62, pulse 74, temperature 98.2 F (36.8 C), height 5\' 5"  (1.651 m), weight 104 lb 4.8 oz (47.31 kg).  LABORATORY DATA: Lab Results  Component Value Date   WBC 7.2 08/16/2011   HGB 7.6* 08/16/2011   HCT 23.6* 08/16/2011   MCV 93.7 08/16/2011   PLT 869* 08/16/2011      Chemistry      Component Value Date/Time   NA 137 07/17/2011 0853   K 5.7* 07/17/2011 0853   CL 101 07/17/2011 0853   CO2 27 07/17/2011 0853   BUN 16 07/17/2011 0853   CREATININE 0.75 07/17/2011 0853   CREATININE 0.69 07/18/2010 1202      Component Value Date/Time   CALCIUM 9.3 07/17/2011 0853   ALKPHOS 28* 04/18/2011 1349   AST 22 04/18/2011 1349   ALT 17 04/18/2011 1349   BILITOT 0.8 04/18/2011 1349       RADIOGRAPHIC STUDIES:  No results found.  ASSESSMENT: 78 year old female with:  1.  severe anemia due to primary refractory anemia Patient is receiving supportive therapy. Declined ESA support (aranesp)  2. Patient and family aware that she is at risk for iron overload and that we will continue to monitor this.  3. Diabetes  PLAN:    1. we will transfuse 2 units of packed red cells for symptomatic anemia.   2. We will check your iron studies on your next visit.  3. Please follow up with your primary care MD for your diabetes  4. I will se you back on 09/13/11   5. Call with any problems or concerns at [phone] and ask for Dr. Rosine Beat nurse.  All questions were answered. The patient knows to call the clinic with any problems, questions or concerns. We can certainly see the patient much sooner if necessary.  I spent 25 minutes counseling the patient face to face. The total time spent in the appointment was 30 minutes.    Drue Second, MD Medical/Oncology [facility] [phone] (beeper) [phone] (Office)  08/16/2011, 11:16 AM

## 2011-08-19 ENCOUNTER — Encounter (HOSPITAL_COMMUNITY)
Admission: RE | Admit: 2011-08-19 | Discharge: 2011-08-19 | Disposition: A | Discharge status: Home / Self Care | Payer: Medicare Other | Source: Ambulatory Visit | Attending: Oncology | Admitting: Oncology

## 2011-08-19 ENCOUNTER — Telehealth: Payer: Self-pay | Admitting: Family Medicine

## 2011-08-19 DIAGNOSIS — D649 Anemia, unspecified: Secondary | ICD-10-CM | POA: Insufficient documentation

## 2011-08-19 NOTE — Telephone Encounter (Signed)
Pt Platelet count is high and pt has question because they told her to drink glucerna and she is worried because she has had issues in the past with her potassium levels and is requesting you contact her

## 2011-08-19 NOTE — Telephone Encounter (Signed)
Pt platelet count is 869.  Dr Luciano Cutter saw her on Friday and suggested she eat more beans and meat, however pt is not a big eater, so she suggested glucerna.  Pt reports she tried that last fall and her potassium levels became a problem.  Pt having a transfusion on Wed. Please advise

## 2011-08-19 NOTE — Telephone Encounter (Signed)
glucerna will have no lowering effect on her platelets so no compelling reason to take that if she can't tolerate.  Their suggestion for "beans and meat", I suspect, is to improve her protein intake.  Other strategy is eating small amounts more frequently (eg 5 small meals per day) and calorie dense foods like peanut butter.

## 2011-08-20 NOTE — Telephone Encounter (Signed)
Pt informed.  I will mail her suggestions for high protein foods

## 2011-08-21 ENCOUNTER — Ambulatory Visit: Payer: Medicare Other | Admitting: Lab

## 2011-08-21 ENCOUNTER — Ambulatory Visit (HOSPITAL_BASED_OUTPATIENT_CLINIC_OR_DEPARTMENT_OTHER): Payer: Medicare Other

## 2011-08-21 VITALS — BP 166/64 | HR 75 | Temp 98.9°F | Resp 18

## 2011-08-21 DIAGNOSIS — D649 Anemia, unspecified: Secondary | ICD-10-CM

## 2011-08-21 LAB — PREPARE RBC (CROSSMATCH)

## 2011-08-21 MED ORDER — ACETAMINOPHEN 325 MG PO TABS
650.0000 mg | ORAL_TABLET | Freq: Once | ORAL | Status: AC
  Administered 2011-08-21: 650 mg via ORAL

## 2011-08-21 MED ORDER — SODIUM CHLORIDE 0.9 % IV SOLN
250.0000 mL | Freq: Once | INTRAVENOUS | Status: AC
  Administered 2011-08-21: 250 mL via INTRAVENOUS

## 2011-08-21 MED ORDER — DIPHENHYDRAMINE HCL 25 MG PO CAPS
25.0000 mg | ORAL_CAPSULE | Freq: Once | ORAL | Status: AC
  Administered 2011-08-21: 25 mg via ORAL

## 2011-08-21 NOTE — Patient Instructions (Signed)
Blood Transfusion Information  WHAT IS A BLOOD TRANSFUSION?  A transfusion is the replacement of blood or some of its parts. Blood is made up of multiple cells which provide different functions.   Red blood cells carry oxygen and are used for blood loss replacement.   White blood cells fight against infection.   Platelets control bleeding.   Plasma helps clot blood.   Other blood products are available for specialized needs, such as hemophilia or other clotting disorders.  BEFORE THE TRANSFUSION   Who gives blood for transfusions?    You may be able to donate blood to be used at a later date on yourself (autologous donation).   Relatives can be asked to donate blood. This is generally not any safer than if you have received blood from a stranger. The same precautions are taken to ensure safety when a relative's blood is donated.   Healthy volunteers who are fully evaluated to make sure their blood is safe. This is blood bank blood.  Transfusion therapy is the safest it has ever been in the practice of medicine. Before blood is taken from a donor, a complete history is taken to make sure that person has no history of diseases nor engages in risky social behavior (examples are intravenous drug use or sexual activity with multiple partners). The donor's travel history is screened to minimize risk of transmitting infections, such as malaria. The donated blood is tested for signs of infectious diseases, such as HIV and hepatitis. The blood is then tested to be sure it is compatible with you in order to minimize the chance of a transfusion reaction. If you or a relative donates blood, this is often done in anticipation of surgery and is not appropriate for emergency situations. It takes many days to process the donated blood.  RISKS AND COMPLICATIONS  Although transfusion therapy is very safe and saves many lives, the main dangers of transfusion include:    Getting an infectious disease.   Developing a  transfusion reaction. This is an allergic reaction to something in the blood you were given. Every precaution is taken to prevent this.  The decision to have a blood transfusion has been considered carefully by your caregiver before blood is given. Blood is not given unless the benefits outweigh the risks.  AFTER THE TRANSFUSION   Right after receiving a blood transfusion, you will usually feel much better and more energetic. This is especially true if your red blood cells have gotten low (anemic). The transfusion raises the level of the red blood cells which carry oxygen, and this usually causes an energy increase.   The nurse administering the transfusion will monitor you carefully for complications.  HOME CARE INSTRUCTIONS   No special instructions are needed after a transfusion. You may find your energy is better. Speak with your caregiver about any limitations on activity for underlying diseases you may have.  SEEK MEDICAL CARE IF:    Your condition is not improving after your transfusion.   You develop redness or irritation at the intravenous (IV) site.  SEEK IMMEDIATE MEDICAL CARE IF:   Any of the following symptoms occur over the next 12 hours:   Shaking chills.   You have a temperature by mouth above 102 F (38.9 C), not controlled by medicine.   Chest, back, or muscle pain.   People around you feel you are not acting correctly or are confused.   Shortness of breath or difficulty breathing.   Dizziness and fainting.     You get a rash or develop hives.   You have a decrease in urine output.   Your urine turns a dark color or changes to pink, red, or brown.  Any of the following symptoms occur over the next 10 days:   You have a temperature by mouth above 102 F (38.9 C), not controlled by medicine.   Shortness of breath.   Weakness after normal activity.   The white part of the eye turns yellow (jaundice).   You have a decrease in the amount of urine or are urinating less often.   Your  urine turns a dark color or changes to pink, red, or brown.  Document Released: 04/12/2000 Document Revised: 04/04/2011 Document Reviewed: 11/30/2007  [employer] Patient Information 2012 [employer].

## 2011-08-22 LAB — TYPE AND SCREEN
Antibody Screen: NEGATIVE
Unit division: 0

## 2011-09-06 ENCOUNTER — Other Ambulatory Visit: Payer: Self-pay | Admitting: Cardiology

## 2011-09-06 NOTE — Telephone Encounter (Signed)
Refilled amlodipine 

## 2011-09-13 ENCOUNTER — Other Ambulatory Visit (HOSPITAL_BASED_OUTPATIENT_CLINIC_OR_DEPARTMENT_OTHER): Payer: Medicare Other | Admitting: Lab

## 2011-09-13 ENCOUNTER — Encounter: Payer: Self-pay | Admitting: Oncology

## 2011-09-13 ENCOUNTER — Telehealth: Payer: Self-pay | Admitting: *Deleted

## 2011-09-13 ENCOUNTER — Other Ambulatory Visit: Payer: Self-pay | Admitting: Family Medicine

## 2011-09-13 ENCOUNTER — Ambulatory Visit (HOSPITAL_BASED_OUTPATIENT_CLINIC_OR_DEPARTMENT_OTHER): Payer: Medicare Other | Admitting: Oncology

## 2011-09-13 ENCOUNTER — Telehealth: Payer: Self-pay | Admitting: Oncology

## 2011-09-13 VITALS — BP 144/56 | HR 81 | Temp 98.3°F | Ht 65.0 in | Wt 105.7 lb

## 2011-09-13 DIAGNOSIS — D649 Anemia, unspecified: Secondary | ICD-10-CM

## 2011-09-13 LAB — CBC WITH DIFFERENTIAL/PLATELET
Basophils Absolute: 0 10*3/uL (ref 0.0–0.1)
Eosinophils Absolute: 0.2 10*3/uL (ref 0.0–0.5)
HGB: 8.2 g/dL — ABNORMAL LOW (ref 11.6–15.9)
MCV: 92.3 fL (ref 79.5–101.0)
MONO%: 0.4 % (ref 0.0–14.0)
NEUT#: 9.3 10*3/uL — ABNORMAL HIGH (ref 1.5–6.5)
RDW: 18.4 % — ABNORMAL HIGH (ref 11.2–14.5)
lymph#: 1.6 10*3/uL (ref 0.9–3.3)

## 2011-09-13 LAB — FERRITIN: Ferritin: 890 ng/mL — ABNORMAL HIGH (ref 10–291)

## 2011-09-13 LAB — IRON AND TIBC: Iron: 245 ug/dL — ABNORMAL HIGH (ref 42–145)

## 2011-09-13 NOTE — Patient Instructions (Addendum)
1. You will come in on 5/20 for type and cross  2. You will need blood transfusion on 5/21.  3. I will see you back in 1 month time for follow up with labs.

## 2011-09-13 NOTE — Telephone Encounter (Signed)
gve the pt her may/june 2013 appt calendar along. Pt is aware to stop by the scheduling desk on Monday to pick up her blood transfusion appt

## 2011-09-13 NOTE — Progress Notes (Signed)
OFFICE PROGRESS NOTE  CC  [name], MD, MD [address] [place] [address]  DIAGNOSIS: 78 year old female with severe and profound anemia likely due to a primary bone marrow problem. Differential does include myelodysplastic syndrome and anemia of chronic disease or  PRIOR THERAPY:  #1 patient is status post packed red cell transfusion during recent hospitalization.  CURRENT THERAPY: supportive  blood transfusions  INTERVAL HISTORY: Meredith Rodriguez 78 y.o. female returns for Followup visit today.Patient is tired today. She has not had any kind of bleeding. She is monitoring her blood sugars very carefully and follows a very strict diet. Remainder of the 10 point review of systems is negative.  MEDICAL HISTORY: Past Medical History  Diagnosis Date  . Hypertension   . Diabetes mellitus   . Hyperkalemia   . IBS (irritable bowel syndrome)   . Hypercholesterolemia   . Cervical radiculopathy   . Anemia     ALLERGIES:  is allergic to amoxicillin; cephalexin; metformin and related; phenergan; and procardia.  MEDICATIONS:  Current Outpatient Prescriptions  Medication Sig Dispense Refill  . amLODipine (NORVASC) 5 MG tablet TAKE 1 TABLET BY MOUTH DAILY.  90 tablet  3  . Ascorbic Acid (VITAMIN C) 500 MG tablet Take 500 mg by mouth daily.        Marland Kitchen aspirin 81 MG tablet Take 81 mg by mouth daily.        Marland Kitchen estrogens, conjugated, (PREMARIN) 0.3 MG tablet Take 0.3 mg by mouth daily. Take daily for 21 days then do not take for 7 days.      Marland Kitchen glucose blood test strip Twice daily as instructed  100 each  3  . Lancets (ACCU-CHEK SOFT TOUCH) lancets Use as instructed twice daily  100 each  12  . metoprolol (LOPRESSOR) 50 MG tablet Take 1 tablet (50 mg total) by mouth 2 (two) times daily.  180 tablet  3  . mometasone (ELOCON) 0.1 % lotion Apply 1 application topically as needed.       . Omega-3 Fatty Acids (FISH OIL) 1000 MG CAPS Take 1,000 mg by mouth daily.       . simvastatin  (ZOCOR) 5 MG tablet Take 1 tablet (5 mg total) by mouth at bedtime.  90 tablet  1  . traMADol (ULTRAM) 50 MG tablet Take 50 mg by mouth every 6 (six) hours as needed. For pain.      Marland Kitchen amLODipine (NORVASC) 5 MG tablet TAKE 1 TABLET BY MOUTH DAILY.  90 tablet  3  . DISCONTD: glimepiride (AMARYL) 2 MG tablet Take 1 tablet (2 mg total) by mouth every morning.  30 tablet  11   Current Facility-Administered Medications  Medication Dose Route Frequency Provider Last Rate Last Dose  . TDaP (BOOSTRIX) injection 0.5 mL  0.5 mL Intramuscular Once [name], MD        SURGICAL HISTORY:  Past Surgical History  Procedure Date  . Varicose veins     surgey 1959  . Tonsilectomy, adenoidectomy, bilateral myringotomy and tubes   . Esophagogastroduodenoscopy 04/01/2011    Procedure: ESOPHAGOGASTRODUODENOSCOPY (EGD);  Surgeon: Barrie Folk, MD;  Location: [facility] ENDOSCOPY;  Service: Endoscopy;  Laterality: N/A;    REVIEW OF SYSTEMS:  Pertinent items are noted in HPI.   PHYSICAL EXAMINATION: General appearance: alert, cooperative, appears stated age, flushed, mild distress and pale Head: Normocephalic, without obvious abnormality, atraumatic Neck: no adenopathy, no carotid bruit, no JVD, supple, symmetrical, trachea midline and thyroid not enlarged, symmetric,  no tenderness/mass/nodules Lymph nodes: Cervical, supraclavicular, and axillary nodes normal. Resp: clear to auscultation bilaterally and normal percussion bilaterally Back: symmetric, no curvature. ROM normal. No CVA tenderness. Cardio: regular rate and rhythm, S1, S2 normal, no murmur, click, rub or gallop and normal apical impulse GI: soft, non-tender; bowel sounds normal; no masses,  no organomegaly Extremities: extremities normal, atraumatic, no cyanosis or edema Neurologic: Alert and oriented X 3, normal strength and tone. Normal symmetric reflexes. Normal coordination and gait  ECOG PERFORMANCE STATUS: 2 - Symptomatic, <50% confined to  bed  Blood pressure 144/56, pulse 81, temperature 98.3 F (36.8 C), temperature source Oral, height 5\' 5"  (1.651 m), weight 105 lb 11.2 oz (47.945 kg).  LABORATORY DATA: Lab Results  Component Value Date   WBC 11.1* 09/13/2011   HGB 8.2* 09/13/2011   HCT 25.2* 09/13/2011   MCV 92.3 09/13/2011   PLT 772* 09/13/2011      Chemistry      Component Value Date/Time   NA 137 07/17/2011 0853   K 5.7* 07/17/2011 0853   CL 101 07/17/2011 0853   CO2 27 07/17/2011 0853   BUN 16 07/17/2011 0853   CREATININE 0.75 07/17/2011 0853   CREATININE 0.69 07/18/2010 1202      Component Value Date/Time   CALCIUM 9.3 07/17/2011 0853   ALKPHOS 28* 04/18/2011 1349   AST 22 04/18/2011 1349   ALT 17 04/18/2011 1349   BILITOT 0.8 04/18/2011 1349       RADIOGRAPHIC STUDIES:  No results found.  ASSESSMENT: 78 year old female with:  1.  severe anemia due to primary refractory anemia Patient is receiving supportive therapy. Declined ESA support (aranesp)  2. Patient and family aware that she is at risk for iron overload and that we will continue to monitor this.  3. Diabetes  PLAN:    1. we will transfuse 2 units of packed red cells for symptomatic anemia.   2. We will check your iron studies on your next visit.   Call with any problems or concerns at [phone] and ask for Dr. Rosine Beat nurse.  All questions were answered. The patient knows to call the clinic with any problems, questions or concerns. We can certainly see the patient much sooner if necessary.  I spent 25 minutes counseling the patient face to face. The total time spent in the appointment was 30 minutes.    Drue Second, MD Medical/Oncology [facility] [phone] (beeper) [phone] (Office)  09/13/2011, 1:31 PM

## 2011-09-13 NOTE — Telephone Encounter (Signed)
Per staff message from [place], I have scheduled appt for the patient. JMW

## 2011-09-16 ENCOUNTER — Ambulatory Visit (HOSPITAL_BASED_OUTPATIENT_CLINIC_OR_DEPARTMENT_OTHER): Payer: Medicare Other | Admitting: Lab

## 2011-09-16 ENCOUNTER — Encounter (HOSPITAL_COMMUNITY): Payer: Self-pay | Admitting: Cardiology

## 2011-09-16 ENCOUNTER — Emergency Department (HOSPITAL_COMMUNITY): Payer: No Typology Code available for payment source

## 2011-09-16 ENCOUNTER — Emergency Department (HOSPITAL_COMMUNITY)
Admission: EM | Admit: 2011-09-16 | Discharge: 2011-09-16 | Disposition: A | Discharge status: Home / Self Care | Payer: No Typology Code available for payment source | Attending: Emergency Medicine | Admitting: Emergency Medicine

## 2011-09-16 ENCOUNTER — Encounter (HOSPITAL_COMMUNITY)
Admission: RE | Admit: 2011-09-16 | Discharge: 2011-09-16 | Disposition: A | Discharge status: Home / Self Care | Payer: Medicare Other | Source: Ambulatory Visit | Attending: Oncology | Admitting: Oncology

## 2011-09-16 DIAGNOSIS — M542 Cervicalgia: Secondary | ICD-10-CM | POA: Insufficient documentation

## 2011-09-16 DIAGNOSIS — D649 Anemia, unspecified: Secondary | ICD-10-CM | POA: Insufficient documentation

## 2011-09-16 DIAGNOSIS — S39012A Strain of muscle, fascia and tendon of lower back, initial encounter: Secondary | ICD-10-CM

## 2011-09-16 DIAGNOSIS — R509 Fever, unspecified: Secondary | ICD-10-CM | POA: Insufficient documentation

## 2011-09-16 DIAGNOSIS — S161XXA Strain of muscle, fascia and tendon at neck level, initial encounter: Secondary | ICD-10-CM

## 2011-09-16 DIAGNOSIS — Z7982 Long term (current) use of aspirin: Secondary | ICD-10-CM | POA: Insufficient documentation

## 2011-09-16 DIAGNOSIS — E119 Type 2 diabetes mellitus without complications: Secondary | ICD-10-CM | POA: Insufficient documentation

## 2011-09-16 DIAGNOSIS — IMO0002 Reserved for concepts with insufficient information to code with codable children: Secondary | ICD-10-CM | POA: Insufficient documentation

## 2011-09-16 DIAGNOSIS — M545 Low back pain, unspecified: Secondary | ICD-10-CM | POA: Insufficient documentation

## 2011-09-16 DIAGNOSIS — Z79899 Other long term (current) drug therapy: Secondary | ICD-10-CM | POA: Insufficient documentation

## 2011-09-16 DIAGNOSIS — S139XXA Sprain of joints and ligaments of unspecified parts of neck, initial encounter: Secondary | ICD-10-CM | POA: Insufficient documentation

## 2011-09-16 DIAGNOSIS — S335XXA Sprain of ligaments of lumbar spine, initial encounter: Secondary | ICD-10-CM | POA: Insufficient documentation

## 2011-09-16 DIAGNOSIS — I10 Essential (primary) hypertension: Secondary | ICD-10-CM | POA: Insufficient documentation

## 2011-09-16 DIAGNOSIS — E7800 Pure hypercholesterolemia, unspecified: Secondary | ICD-10-CM | POA: Insufficient documentation

## 2011-09-16 DIAGNOSIS — K589 Irritable bowel syndrome without diarrhea: Secondary | ICD-10-CM | POA: Insufficient documentation

## 2011-09-16 LAB — URINALYSIS, ROUTINE W REFLEX MICROSCOPIC
Glucose, UA: NEGATIVE mg/dL
Hgb urine dipstick: NEGATIVE
Leukocytes, UA: NEGATIVE
pH: 6 (ref 5.0–8.0)

## 2011-09-16 MED ORDER — OXYCODONE-ACETAMINOPHEN 5-325 MG PO TABS: 1.0000 | ORAL_TABLET | ORAL | Status: AC | PRN
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
## 2011-09-16 NOTE — ED Notes (Signed)
Pt placed back on cardiac monitor. No distress noted at this time.
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
## 2011-09-16 NOTE — ED Notes (Signed)
Pt remains out of room at this time
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
## 2011-09-16 NOTE — ED Notes (Signed)
Pt taken to CT and x-ray at this time. No distress noted.

## 2011-09-16 NOTE — ED Notes (Signed)
Pt moved to room #1.
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
## 2011-09-17 ENCOUNTER — Telehealth: Payer: Self-pay | Admitting: *Deleted

## 2011-09-17 LAB — PREPARE RBC (CROSSMATCH)
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
## 2011-09-18 ENCOUNTER — Ambulatory Visit (HOSPITAL_BASED_OUTPATIENT_CLINIC_OR_DEPARTMENT_OTHER): Payer: Medicare Other

## 2011-09-18 VITALS — BP 149/58 | HR 86 | Temp 98.6°F | Resp 18

## 2011-09-18 DIAGNOSIS — D649 Anemia, unspecified: Secondary | ICD-10-CM

## 2011-09-18 MED ORDER — DIPHENHYDRAMINE HCL 25 MG PO CAPS
25.0000 mg | ORAL_CAPSULE | Freq: Once | ORAL | Status: AC
  Administered 2011-09-18: 25 mg via ORAL

## 2011-09-18 MED ORDER — ACETAMINOPHEN 325 MG PO TABS
650.0000 mg | ORAL_TABLET | Freq: Once | ORAL | Status: AC
  Administered 2011-09-18: 650 mg via ORAL

## 2011-09-18 NOTE — Patient Instructions (Signed)
Blood Transfusion Information  WHAT IS A BLOOD TRANSFUSION?  A transfusion is the replacement of blood or some of its parts. Blood is made up of multiple cells which provide different functions.   Red blood cells carry oxygen and are used for blood loss replacement.   White blood cells fight against infection.   Platelets control bleeding.   Plasma helps clot blood.   Other blood products are available for specialized needs, such as hemophilia or other clotting disorders.  BEFORE THE TRANSFUSION   Who gives blood for transfusions?    You may be able to donate blood to be used at a later date on yourself (autologous donation).   Relatives can be asked to donate blood. This is generally not any safer than if you have received blood from a stranger. The same precautions are taken to ensure safety when a relative's blood is donated.   Healthy volunteers who are fully evaluated to make sure their blood is safe. This is blood bank blood.  Transfusion therapy is the safest it has ever been in the practice of medicine. Before blood is taken from a donor, a complete history is taken to make sure that person has no history of diseases nor engages in risky social behavior (examples are intravenous drug use or sexual activity with multiple partners). The donor's travel history is screened to minimize risk of transmitting infections, such as malaria. The donated blood is tested for signs of infectious diseases, such as HIV and hepatitis. The blood is then tested to be sure it is compatible with you in order to minimize the chance of a transfusion reaction. If you or a relative donates blood, this is often done in anticipation of surgery and is not appropriate for emergency situations. It takes many days to process the donated blood.  RISKS AND COMPLICATIONS  Although transfusion therapy is very safe and saves many lives, the main dangers of transfusion include:    Getting an infectious disease.   Developing a  transfusion reaction. This is an allergic reaction to something in the blood you were given. Every precaution is taken to prevent this.  The decision to have a blood transfusion has been considered carefully by your caregiver before blood is given. Blood is not given unless the benefits outweigh the risks.  AFTER THE TRANSFUSION   Right after receiving a blood transfusion, you will usually feel much better and more energetic. This is especially true if your red blood cells have gotten low (anemic). The transfusion raises the level of the red blood cells which carry oxygen, and this usually causes an energy increase.   The nurse administering the transfusion will monitor you carefully for complications.  HOME CARE INSTRUCTIONS   No special instructions are needed after a transfusion. You may find your energy is better. Speak with your caregiver about any limitations on activity for underlying diseases you may have.  SEEK MEDICAL CARE IF:    Your condition is not improving after your transfusion.   You develop redness or irritation at the intravenous (IV) site.  SEEK IMMEDIATE MEDICAL CARE IF:   Any of the following symptoms occur over the next 12 hours:   Shaking chills.   You have a temperature by mouth above 102 F (38.9 C), not controlled by medicine.   Chest, back, or muscle pain.   People around you feel you are not acting correctly or are confused.   Shortness of breath or difficulty breathing.   Dizziness and fainting.     You get a rash or develop hives.   You have a decrease in urine output.   Your urine turns a dark color or changes to pink, red, or brown.  Any of the following symptoms occur over the next 10 days:   You have a temperature by mouth above 102 F (38.9 C), not controlled by medicine.   Shortness of breath.   Weakness after normal activity.   The white part of the eye turns yellow (jaundice).   You have a decrease in the amount of urine or are urinating less often.   Your  urine turns a dark color or changes to pink, red, or brown.  Document Released: 04/12/2000 Document Revised: 04/04/2011 Document Reviewed: 11/30/2007  [employer] Patient Information 2012 [employer].

## 2011-09-19 ENCOUNTER — Ambulatory Visit (HOSPITAL_BASED_OUTPATIENT_CLINIC_OR_DEPARTMENT_OTHER): Payer: Medicare Other

## 2011-09-19 VITALS — BP 154/66 | HR 84 | Temp 98.1°F | Resp 18

## 2011-09-19 DIAGNOSIS — D649 Anemia, unspecified: Secondary | ICD-10-CM

## 2011-09-19 MED ORDER — ACETAMINOPHEN 325 MG PO TABS
650.0000 mg | ORAL_TABLET | Freq: Once | ORAL | Status: AC
  Administered 2011-09-19: 650 mg via ORAL

## 2011-09-19 MED ORDER — SODIUM CHLORIDE 0.9 % IV SOLN: 250.0000 mL | Freq: Once | INTRAVENOUS | Status: DC

## 2011-09-19 MED ORDER — DIPHENHYDRAMINE HCL 25 MG PO CAPS
25.0000 mg | ORAL_CAPSULE | Freq: Once | ORAL | Status: AC
  Administered 2011-09-19: 25 mg via ORAL

## 2011-09-20 LAB — TYPE AND SCREEN
ABO/RH(D): AB NEG
Antibody Screen: NEGATIVE
Unit division: 0

## 2011-09-24 ENCOUNTER — Telehealth: Payer: Self-pay | Admitting: Family Medicine
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
## 2011-09-25 ENCOUNTER — Ambulatory Visit (INDEPENDENT_AMBULATORY_CARE_PROVIDER_SITE_OTHER): Payer: Medicare Other | Admitting: Family Medicine

## 2011-09-25 ENCOUNTER — Encounter: Payer: Self-pay | Admitting: Family Medicine

## 2011-09-25 VITALS — BP 144/80 | HR 80 | Temp 98.1°F | Resp 16 | Wt 102.0 lb

## 2011-09-25 DIAGNOSIS — I10 Essential (primary) hypertension: Secondary | ICD-10-CM

## 2011-09-25 DIAGNOSIS — E119 Type 2 diabetes mellitus without complications: Secondary | ICD-10-CM

## 2011-09-25 DIAGNOSIS — S161XXA Strain of muscle, fascia and tendon at neck level, initial encounter: Secondary | ICD-10-CM

## 2011-09-25 DIAGNOSIS — S335XXA Sprain of ligaments of lumbar spine, initial encounter: Secondary | ICD-10-CM

## 2011-09-25 DIAGNOSIS — S139XXA Sprain of joints and ligaments of unspecified parts of neck, initial encounter: Secondary | ICD-10-CM

## 2011-09-25 DIAGNOSIS — S39012A Strain of muscle, fascia and tendon of lower back, initial encounter: Secondary | ICD-10-CM
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
## 2011-10-14 ENCOUNTER — Other Ambulatory Visit: Payer: Self-pay | Admitting: Cardiology

## 2011-10-14 NOTE — Telephone Encounter (Signed)
-  refilled simvastatin 

## 2011-10-17 ENCOUNTER — Encounter (HOSPITAL_COMMUNITY)
Admission: RE | Admit: 2011-10-17 | Discharge: 2011-10-17 | Disposition: A | Discharge status: Home / Self Care | Payer: Medicare Other | Source: Ambulatory Visit | Attending: Oncology | Admitting: Oncology

## 2011-10-17 ENCOUNTER — Ambulatory Visit (HOSPITAL_COMMUNITY)
Admission: RE | Admit: 2011-10-17 | Discharge: 2011-10-17 | Disposition: A | Discharge status: Home / Self Care | Payer: Medicare Other | Source: Ambulatory Visit | Attending: Oncology | Admitting: Oncology

## 2011-10-17 ENCOUNTER — Telehealth: Payer: Self-pay | Admitting: Oncology

## 2011-10-17 ENCOUNTER — Other Ambulatory Visit (HOSPITAL_BASED_OUTPATIENT_CLINIC_OR_DEPARTMENT_OTHER): Payer: Medicare Other | Admitting: Lab

## 2011-10-17 ENCOUNTER — Ambulatory Visit (HOSPITAL_BASED_OUTPATIENT_CLINIC_OR_DEPARTMENT_OTHER): Payer: Medicare Other | Admitting: Oncology

## 2011-10-17 ENCOUNTER — Ambulatory Visit: Payer: Medicare Other

## 2011-10-17 ENCOUNTER — Encounter: Payer: Self-pay | Admitting: Oncology

## 2011-10-17 VITALS — BP 136/56 | HR 97 | Temp 99.1°F | Ht 65.0 in | Wt 103.6 lb

## 2011-10-17 DIAGNOSIS — I517 Cardiomegaly: Secondary | ICD-10-CM | POA: Insufficient documentation

## 2011-10-17 DIAGNOSIS — D4620 Refractory anemia with excess of blasts, unspecified: Secondary | ICD-10-CM

## 2011-10-17 DIAGNOSIS — J4489 Other specified chronic obstructive pulmonary disease: Secondary | ICD-10-CM | POA: Insufficient documentation

## 2011-10-17 DIAGNOSIS — E119 Type 2 diabetes mellitus without complications: Secondary | ICD-10-CM

## 2011-10-17 DIAGNOSIS — D649 Anemia, unspecified: Secondary | ICD-10-CM

## 2011-10-17 DIAGNOSIS — R05 Cough: Secondary | ICD-10-CM | POA: Insufficient documentation

## 2011-10-17 DIAGNOSIS — D638 Anemia in other chronic diseases classified elsewhere: Secondary | ICD-10-CM

## 2011-10-17 DIAGNOSIS — R059 Cough, unspecified: Secondary | ICD-10-CM | POA: Insufficient documentation

## 2011-10-17 DIAGNOSIS — J449 Chronic obstructive pulmonary disease, unspecified: Secondary | ICD-10-CM | POA: Insufficient documentation

## 2011-10-17 DIAGNOSIS — R062 Wheezing: Secondary | ICD-10-CM | POA: Insufficient documentation

## 2011-10-17 LAB — CBC WITH DIFFERENTIAL/PLATELET
Eosinophils Absolute: 0.3 10*3/uL (ref 0.0–0.5)
MONO#: 0 10*3/uL — ABNORMAL LOW (ref 0.1–0.9)
MONO%: 0.1 % (ref 0.0–14.0)
NEUT#: 15.2 10*3/uL — ABNORMAL HIGH (ref 1.5–6.5)
RBC: 2.81 10*6/uL — ABNORMAL LOW (ref 3.70–5.45)
RDW: 16.1 % — ABNORMAL HIGH (ref 11.2–14.5)
WBC: 16.8 10*3/uL — ABNORMAL HIGH (ref 3.9–10.3)
lymph#: 1.3 10*3/uL (ref 0.9–3.3)

## 2011-10-17 LAB — FERRITIN: Ferritin: 1794 ng/mL — ABNORMAL HIGH (ref 10–291)

## 2011-10-17 MED ORDER — AZITHROMYCIN 250 MG PO TABS: ORAL_TABLET | ORAL | Status: AC

## 2011-10-17 MED ORDER — CETIRIZINE HCL 10 MG PO CHEW: 10.0000 mg | CHEWABLE_TABLET | Freq: Every day | ORAL | Status: DC

## 2011-10-17 NOTE — Telephone Encounter (Signed)
gve the pt her July 2013 appt calendar. Pt is aware she will be contacted with the blood transfusion appt. Sent michelle a staff message

## 2011-10-17 NOTE — Patient Instructions (Addendum)
1. Transfuse blood on Friday 10/18/11  2. Azithromycin at the pharmacy use as directed  3. Chest X-ray today  4. Take zyrtec at nighttime

## 2011-10-17 NOTE — Telephone Encounter (Signed)
Pt has her cxr referral for wl at today

## 2011-10-17 NOTE — Progress Notes (Signed)
OFFICE PROGRESS NOTE  CC  [name], MD [address] [place] [address]  DIAGNOSIS: 78 year old female with severe and profound anemia likely due to a primary bone marrow problem. Differential does include myelodysplastic syndrome and anemia of chronic disease or  PRIOR THERAPY:  #1 patient is status post packed red cell transfusion during recent hospitalization.  CURRENT THERAPY: supportive  blood transfusions  INTERVAL HISTORY: Meredith Rodriguez 78 y.o. female returns for Followup visit today.she is accompanied by her daughter today. She is complaining of aches and pains she has also complaining of some shortness of breath and a cough. She otherwise has no nausea or vomiting she is she tired fatigued. She states that she feels like she may need another transfusion. She had a CBC performed today. She has not had any bleeding problems no hematuria hematochezia melena hemoptysis or hematemesis. Remainder of the 10 point review of systems is negative. MEDICAL HISTORY: Past Medical History  Diagnosis Date  . Hypertension   . Diabetes mellitus   . Hyperkalemia   . IBS (irritable bowel syndrome)   . Hypercholesterolemia   . Cervical radiculopathy   . Anemia     ALLERGIES:  is allergic to amoxicillin; cephalexin; metformin and related; phenergan; and procardia.  MEDICATIONS:  Current Outpatient Prescriptions  Medication Sig Dispense Refill  . amLODipine (NORVASC) 5 MG tablet       . Ascorbic Acid (VITAMIN C) 500 MG tablet Take 500 mg by mouth daily.        Marland Kitchen aspirin 81 MG tablet Take 81 mg by mouth daily.        Marland Kitchen conjugated estrogens (PREMARIN) vaginal cream Place 0.5 g vaginally once a week. Sunday.      Marland Kitchen glucose blood test strip Twice daily as instructed  100 each  3  . Lancets (ACCU-CHEK SOFT TOUCH) lancets Use as instructed twice daily  100 each  12  . metoprolol (LOPRESSOR) 50 MG tablet Take 50 mg by mouth 2 (two) times daily.      . Omega-3 Fatty Acids (FISH  OIL) 1000 MG CAPS Take 1,000 mg by mouth daily.       . simvastatin (ZOCOR) 5 MG tablet TAKE 1 TABLET BY MOUTH AT BEDTIME  90 tablet  3  . azithromycin (ZITHROMAX Z-PAK) 250 MG tablet Take 2 today then 1 a day until all finished  6 each  0  . cetirizine (ZYRTEC) 10 MG chewable tablet Chew 1 tablet (10 mg total) by mouth daily.  30 tablet  1  . DISCONTD: glimepiride (AMARYL) 2 MG tablet Take 1 tablet (2 mg total) by mouth every morning.  30 tablet  11   Current Facility-Administered Medications  Medication Dose Route Frequency Provider Last Rate Last Dose  . TDaP (BOOSTRIX) injection 0.5 mL  0.5 mL Intramuscular Once [name], MD        SURGICAL HISTORY:  Past Surgical History  Procedure Date  . Varicose veins     surgey 1959  . Tonsilectomy, adenoidectomy, bilateral myringotomy and tubes   . Esophagogastroduodenoscopy 04/01/2011    Procedure: ESOPHAGOGASTRODUODENOSCOPY (EGD);  Surgeon: Barrie Folk, MD;  Location: [facility] ENDOSCOPY;  Service: Endoscopy;  Laterality: N/A;    REVIEW OF SYSTEMS:  Pertinent items are noted in HPI.   PHYSICAL EXAMINATION: General appearance: alert, cooperative, appears stated age, flushed, mild distress and pale Head: Normocephalic, without obvious abnormality, atraumatic Neck: no adenopathy, no carotid bruit, no JVD, supple, symmetrical, trachea midline and thyroid not  enlarged, symmetric, no tenderness/mass/nodules Lymph nodes: Cervical, supraclavicular, and axillary nodes normal. Resp: clear to auscultation bilaterally and normal percussion bilaterally Back: symmetric, no curvature. ROM normal. No CVA tenderness. Cardio: regular rate and rhythm, S1, S2 normal, no murmur, click, rub or gallop and normal apical impulse GI: soft, non-tender; bowel sounds normal; no masses,  no organomegaly Extremities: extremities normal, atraumatic, no cyanosis or edema Neurologic: Alert and oriented X 3, normal strength and tone. Normal symmetric reflexes. Normal  coordination and gait  ECOG PERFORMANCE STATUS: 2 - Symptomatic, <50% confined to bed  Blood pressure 136/56, pulse 97, temperature 99.1 F (37.3 C), temperature source Oral, height 5\' 5"  (1.651 m), weight 103 lb 9.6 oz (46.993 kg).  LABORATORY DATA: Lab Results  Component Value Date   WBC 16.8* 10/17/2011   HGB 8.2* 10/17/2011   HCT 25.9* 10/17/2011   MCV 92.0 10/17/2011   PLT 684* 10/17/2011      Chemistry      Component Value Date/Time   NA 137 07/17/2011 0853   K 5.7* 07/17/2011 0853   CL 101 07/17/2011 0853   CO2 27 07/17/2011 0853   BUN 16 07/17/2011 0853   CREATININE 0.75 07/17/2011 0853   CREATININE 0.69 07/18/2010 1202      Component Value Date/Time   CALCIUM 9.3 07/17/2011 0853   ALKPHOS 28* 04/18/2011 1349   AST 22 04/18/2011 1349   ALT 17 04/18/2011 1349   BILITOT 0.8 04/18/2011 1349       RADIOGRAPHIC STUDIES:  No results found.  ASSESSMENT: 78 year old female with:  1.  severe anemia due to primary refractory anemia Patient is receiving supportive therapy. Declined ESA support (aranesp)  2. Patient and family aware that she is at risk for iron overload and that we will continue to monitor this.  3. Diabetes  #3 patient with a cough  PLAN:    1. we will transfuse 2 units of packed red cells for symptomatic anemia.   2. We will check your iron studies on your next visit.   #3 we will get a chest x-ray and she will be treated empirically with azithromycin.   Call with any problems or concerns at [phone] and ask for Dr. Rosine Beat nurse.  All questions were answered. The patient knows to call the clinic with any problems, questions or concerns. We can certainly see the patient much sooner if necessary.  I spent 25 minutes counseling the patient face to face. The total time spent in the appointment was 30 minutes.    Meredith Second, MD Medical/Oncology [facility] [phone] (beeper) [phone] (Office)  10/17/2011, 3:20 PM

## 2011-10-18 ENCOUNTER — Other Ambulatory Visit: Payer: Self-pay | Admitting: Oncology

## 2011-10-18 ENCOUNTER — Ambulatory Visit (HOSPITAL_BASED_OUTPATIENT_CLINIC_OR_DEPARTMENT_OTHER): Payer: Medicare Other

## 2011-10-18 ENCOUNTER — Telehealth: Payer: Self-pay | Admitting: *Deleted

## 2011-10-18 VITALS — BP 148/69 | HR 76 | Temp 97.9°F | Resp 18

## 2011-10-18 DIAGNOSIS — D649 Anemia, unspecified: Secondary | ICD-10-CM

## 2011-10-18 LAB — PREPARE RBC (CROSSMATCH)

## 2011-10-18 MED ORDER — DIPHENHYDRAMINE HCL 25 MG PO CAPS
25.0000 mg | ORAL_CAPSULE | Freq: Once | ORAL | Status: AC
  Administered 2011-10-18: 25 mg via ORAL

## 2011-10-18 MED ORDER — ACETAMINOPHEN 325 MG PO TABS
650.0000 mg | ORAL_TABLET | Freq: Once | ORAL | Status: AC
  Administered 2011-10-18: 650 mg via ORAL

## 2011-10-18 NOTE — Telephone Encounter (Signed)
Message copied by Cooper Render on Fri Oct 18, 2011 12:53 PM ------      Message from: Victorino December      Created: Fri Oct 18, 2011 12:51 PM       Call patient: chest x-ray normal

## 2011-10-18 NOTE — Telephone Encounter (Signed)
Per MD, Called  Notified pt Chest XRAY normal.

## 2011-10-18 NOTE — Patient Instructions (Signed)
Blood Transfusion Information  WHAT IS A BLOOD TRANSFUSION?  A transfusion is the replacement of blood or some of its parts. Blood is made up of multiple cells which provide different functions.   Red blood cells carry oxygen and are used for blood loss replacement.   White blood cells fight against infection.   Platelets control bleeding.   Plasma helps clot blood.   Other blood products are available for specialized needs, such as hemophilia or other clotting disorders.  BEFORE THE TRANSFUSION   Who gives blood for transfusions?    You may be able to donate blood to be used at a later date on yourself (autologous donation).   Relatives can be asked to donate blood. This is generally not any safer than if you have received blood from a stranger. The same precautions are taken to ensure safety when a relative's blood is donated.   Healthy volunteers who are fully evaluated to make sure their blood is safe. This is blood bank blood.  Transfusion therapy is the safest it has ever been in the practice of medicine. Before blood is taken from a donor, a complete history is taken to make sure that person has no history of diseases nor engages in risky social behavior (examples are intravenous drug use or sexual activity with multiple partners). The donor's travel history is screened to minimize risk of transmitting infections, such as malaria. The donated blood is tested for signs of infectious diseases, such as HIV and hepatitis. The blood is then tested to be sure it is compatible with you in order to minimize the chance of a transfusion reaction. If you or a relative donates blood, this is often done in anticipation of surgery and is not appropriate for emergency situations. It takes many days to process the donated blood.  RISKS AND COMPLICATIONS  Although transfusion therapy is very safe and saves many lives, the main dangers of transfusion include:    Getting an infectious disease.   Developing a  transfusion reaction. This is an allergic reaction to something in the blood you were given. Every precaution is taken to prevent this.  The decision to have a blood transfusion has been considered carefully by your caregiver before blood is given. Blood is not given unless the benefits outweigh the risks.  AFTER THE TRANSFUSION   Right after receiving a blood transfusion, you will usually feel much better and more energetic. This is especially true if your red blood cells have gotten low (anemic). The transfusion raises the level of the red blood cells which carry oxygen, and this usually causes an energy increase.   The nurse administering the transfusion will monitor you carefully for complications.  HOME CARE INSTRUCTIONS   No special instructions are needed after a transfusion. You may find your energy is better. Speak with your caregiver about any limitations on activity for underlying diseases you may have.  SEEK MEDICAL CARE IF:    Your condition is not improving after your transfusion.   You develop redness or irritation at the intravenous (IV) site.  SEEK IMMEDIATE MEDICAL CARE IF:   Any of the following symptoms occur over the next 12 hours:   Shaking chills.   You have a temperature by mouth above 102 F (38.9 C), not controlled by medicine.   Chest, back, or muscle pain.   People around you feel you are not acting correctly or are confused.   Shortness of breath or difficulty breathing.   Dizziness and fainting.     You get a rash or develop hives.   You have a decrease in urine output.   Your urine turns a dark color or changes to pink, red, or brown.  Any of the following symptoms occur over the next 10 days:   You have a temperature by mouth above 102 F (38.9 C), not controlled by medicine.   Shortness of breath.   Weakness after normal activity.   The white part of the eye turns yellow (jaundice).   You have a decrease in the amount of urine or are urinating less often.   Your  urine turns a dark color or changes to pink, red, or brown.  Document Released: 04/12/2000 Document Revised: 04/04/2011 Document Reviewed: 11/30/2007  [employer] Patient Information 2012 [employer].

## 2011-10-19 LAB — TYPE AND SCREEN
ABO/RH(D): AB NEG
Antibody Screen: NEGATIVE
Unit division: 0

## 2011-11-07 ENCOUNTER — Telehealth: Payer: Self-pay | Admitting: Family Medicine

## 2011-11-07 NOTE — Telephone Encounter (Signed)
Pt used to use [facility] order pharmacy to get test strips, but pt hasn't used them over a year. Pt now gets them from [facility] on [address]. Pt does not need refills at this time. Just wanted Harriett Sine to know to pls disregard any future request from [facility].

## 2011-11-11 MED ORDER — GLUCOSE BLOOD VI STRP: ORAL_STRIP | Status: DC

## 2011-11-15 ENCOUNTER — Ambulatory Visit (HOSPITAL_BASED_OUTPATIENT_CLINIC_OR_DEPARTMENT_OTHER): Payer: Medicare Other | Admitting: Oncology

## 2011-11-15 ENCOUNTER — Telehealth: Payer: Self-pay | Admitting: Oncology

## 2011-11-15 ENCOUNTER — Encounter: Payer: Self-pay | Admitting: Oncology

## 2011-11-15 ENCOUNTER — Other Ambulatory Visit (HOSPITAL_BASED_OUTPATIENT_CLINIC_OR_DEPARTMENT_OTHER): Payer: Medicare Other | Admitting: Lab

## 2011-11-15 VITALS — BP 113/58 | HR 72 | Temp 98.1°F | Ht 65.0 in | Wt 102.3 lb

## 2011-11-15 DIAGNOSIS — D649 Anemia, unspecified: Secondary | ICD-10-CM

## 2011-11-15 DIAGNOSIS — D4620 Refractory anemia with excess of blasts, unspecified: Secondary | ICD-10-CM

## 2011-11-15 DIAGNOSIS — I10 Essential (primary) hypertension: Secondary | ICD-10-CM

## 2011-11-15 DIAGNOSIS — E119 Type 2 diabetes mellitus without complications: Secondary | ICD-10-CM

## 2011-11-15 LAB — CBC WITH DIFFERENTIAL/PLATELET
BASO%: 0.1 % (ref 0.0–2.0)
EOS%: 1.7 % (ref 0.0–7.0)
HGB: 8.3 g/dL — ABNORMAL LOW (ref 11.6–15.9)
MCH: 28.7 pg (ref 25.1–34.0)
MCHC: 31.8 g/dL (ref 31.5–36.0)
MONO#: 0 10*3/uL — ABNORMAL LOW (ref 0.1–0.9)
RDW: 16.4 % — ABNORMAL HIGH (ref 11.2–14.5)
WBC: 7.6 10*3/uL (ref 3.9–10.3)
lymph#: 1.3 10*3/uL (ref 0.9–3.3)

## 2011-11-15 NOTE — Progress Notes (Signed)
OFFICE PROGRESS NOTE  CC  [name], MD [address] [place] [address]  DIAGNOSIS: 78 year old female with severe and profound anemia likely due to a primary bone marrow problem. Differential does include myelodysplastic syndrome and anemia of chronic disease or  PRIOR THERAPY:  #1 patient is status post packed red cell transfusion during recent hospitalization.  CURRENT THERAPY: supportive  blood transfusions and to start procrit 40,000 units q 2 weeks beginning on 11/20/11  INTERVAL HISTORY: Meredith Rodriguez 78 y.o. female returns for Followup visit today.she is accompanied by her daughter today. She is complaining of aches and pains she has also complaining of some shortness of breath and a cough. She otherwise has no nausea or vomiting she is she tired fatigued. She states that she feels like she may need another transfusion. She had a CBC performed today. She has not had any bleeding problems no hematuria hematochezia melena hemoptysis or hematemesis. Remainder of the 10 point review of systems is negative. MEDICAL HISTORY: Past Medical History  Diagnosis Date  . Hypertension   . Diabetes mellitus   . Hyperkalemia   . IBS (irritable bowel syndrome)   . Hypercholesterolemia   . Cervical radiculopathy   . Anemia     ALLERGIES:  is allergic to amoxicillin; cephalexin; metformin and related; phenergan; and procardia.  MEDICATIONS:  Current Outpatient Prescriptions  Medication Sig Dispense Refill  . amLODipine (NORVASC) 5 MG tablet       . Ascorbic Acid (VITAMIN C) 500 MG tablet Take 500 mg by mouth daily.        Marland Kitchen aspirin 81 MG tablet Take 81 mg by mouth daily.        . cetirizine (ZYRTEC) 10 MG chewable tablet Chew 1 tablet (10 mg total) by mouth daily.  30 tablet  1  . conjugated estrogens (PREMARIN) vaginal cream Place 0.5 g vaginally once a week. Sunday.      Marland Kitchen glucose blood test strip Twice daily as instructed  100 each  3  . Lancets (ACCU-CHEK SOFT  TOUCH) lancets Use as instructed twice daily  100 each  12  . metoprolol (LOPRESSOR) 50 MG tablet Take 50 mg by mouth 2 (two) times daily.      . Omega-3 Fatty Acids (FISH OIL) 1000 MG CAPS Take 1,000 mg by mouth daily.       . simvastatin (ZOCOR) 5 MG tablet TAKE 1 TABLET BY MOUTH AT BEDTIME  90 tablet  3  . DISCONTD: glimepiride (AMARYL) 2 MG tablet Take 1 tablet (2 mg total) by mouth every morning.  30 tablet  11   Current Facility-Administered Medications  Medication Dose Route Frequency Provider Last Rate Last Dose  . TDaP (BOOSTRIX) injection 0.5 mL  0.5 mL Intramuscular Once [name], MD        SURGICAL HISTORY:  Past Surgical History  Procedure Date  . Varicose veins     surgey 1959  . Tonsilectomy, adenoidectomy, bilateral myringotomy and tubes   . Esophagogastroduodenoscopy 04/01/2011    Procedure: ESOPHAGOGASTRODUODENOSCOPY (EGD);  Surgeon: Barrie Folk, MD;  Location: [facility] ENDOSCOPY;  Service: Endoscopy;  Laterality: N/A;    REVIEW OF SYSTEMS:  Pertinent items are noted in HPI.   PHYSICAL EXAMINATION: General appearance: alert, cooperative, appears stated age, flushed, mild distress and pale Head: Normocephalic, without obvious abnormality, atraumatic Neck: no adenopathy, no carotid bruit, no JVD, supple, symmetrical, trachea midline and thyroid not enlarged, symmetric, no tenderness/mass/nodules Lymph nodes: Cervical, supraclavicular, and axillary nodes  normal. Resp: clear to auscultation bilaterally and normal percussion bilaterally Back: symmetric, no curvature. ROM normal. No CVA tenderness. Cardio: regular rate and rhythm, S1, S2 normal, no murmur, click, rub or gallop and normal apical impulse GI: soft, non-tender; bowel sounds normal; no masses,  no organomegaly Extremities: extremities normal, atraumatic, no cyanosis or edema Neurologic: Alert and oriented X 3, normal strength and tone. Normal symmetric reflexes. Normal coordination and gait  ECOG  PERFORMANCE STATUS: 2 - Symptomatic, <50% confined to bed  Blood pressure 113/58, pulse 72, temperature 98.1 F (36.7 C), temperature source Oral, height 5\' 5"  (1.651 m), weight 102 lb 4.8 oz (46.403 kg).  LABORATORY DATA: Lab Results  Component Value Date   WBC 16.8* 10/17/2011   HGB 8.2* 10/17/2011   HCT 25.9* 10/17/2011   MCV 92.0 10/17/2011   PLT 684* 10/17/2011      Chemistry      Component Value Date/Time   NA 137 07/17/2011 0853   K 5.7* 07/17/2011 0853   CL 101 07/17/2011 0853   CO2 27 07/17/2011 0853   BUN 16 07/17/2011 0853   CREATININE 0.75 07/17/2011 0853   CREATININE 0.69 07/18/2010 1202      Component Value Date/Time   CALCIUM 9.3 07/17/2011 0853   ALKPHOS 28* 04/18/2011 1349   AST 22 04/18/2011 1349   ALT 17 04/18/2011 1349   BILITOT 0.8 04/18/2011 1349       RADIOGRAPHIC STUDIES:  No results found.  ASSESSMENT: 78 year old female with:  1.  severe anemia due to primary refractory anemia Patient is receiving supportive therapy. Declined ESA support (aranesp)  2. Patient and family aware that she is at risk for iron overload and that we will continue to monitor this.  3. Diabetes    PLAN:    1. we will transfuse 2 units of packed red cells for symptomatic anemia.   #2 patient and I and her daughter discussed use of Procrit to keep her counts. We have had this discussion previously as well. I do think that this becomes more important as patient is beginning to become iron overloaded and this would be a great concern in this 78 year old female. We discussed the risks and benefits of erythropoietin therapy. Patient is more than willing to now proceed with Procrit. However we will get a preauthorization prior to giving her the injection.  #3 in the meantime she will proceed with getting transfused next week for symptomatic anemia.  #4 I will plan on seeing her back in one month's time.  All questions were answered. The patient knows to call the clinic with  any problems, questions or concerns. We can certainly see the patient much sooner if necessary.  I spent 25 minutes counseling the patient face to face. The total time spent in the appointment was 30 minutes.    Drue Second, MD Medical/Oncology [facility] [phone] (beeper) [phone] (Office)  11/15/2011, 2:58 PM

## 2011-11-15 NOTE — Patient Instructions (Addendum)
1. Transfusion next week  2. We discussed procrit to help build up your blood and to avoid blood transfusions and iron overload  3. I will see you back in August

## 2011-11-15 NOTE — Telephone Encounter (Signed)
gve the pt her July,aug 2013 appt calendar. °

## 2011-11-18 ENCOUNTER — Encounter (HOSPITAL_COMMUNITY)
Admission: RE | Admit: 2011-11-18 | Discharge: 2011-11-18 | Disposition: A | Discharge status: Home / Self Care | Payer: Medicare Other | Source: Ambulatory Visit | Attending: Oncology | Admitting: Oncology

## 2011-11-18 ENCOUNTER — Other Ambulatory Visit (HOSPITAL_BASED_OUTPATIENT_CLINIC_OR_DEPARTMENT_OTHER): Payer: Medicare Other | Admitting: Lab

## 2011-11-18 DIAGNOSIS — D649 Anemia, unspecified: Secondary | ICD-10-CM

## 2011-11-18 DIAGNOSIS — D4620 Refractory anemia with excess of blasts, unspecified: Secondary | ICD-10-CM

## 2011-11-18 LAB — CBC WITH DIFFERENTIAL/PLATELET
BASO%: 0.1 % (ref 0.0–2.0)
EOS%: 2.3 % (ref 0.0–7.0)
HCT: 25.5 % — ABNORMAL LOW (ref 34.8–46.6)
LYMPH%: 15.7 % (ref 14.0–49.7)
MCH: 29 pg (ref 25.1–34.0)
MCHC: 32.2 g/dL (ref 31.5–36.0)
MCV: 90.1 fL (ref 79.5–101.0)
MONO%: 0.7 % (ref 0.0–14.0)
NEUT%: 81.2 % — ABNORMAL HIGH (ref 38.4–76.8)
Platelets: 533 10*3/uL — ABNORMAL HIGH (ref 145–400)

## 2011-11-19 ENCOUNTER — Other Ambulatory Visit: Payer: Medicare Other | Admitting: Lab

## 2011-11-19 ENCOUNTER — Ambulatory Visit (HOSPITAL_BASED_OUTPATIENT_CLINIC_OR_DEPARTMENT_OTHER): Payer: Medicare Other

## 2011-11-19 VITALS — BP 141/54 | HR 68 | Temp 98.4°F | Resp 20

## 2011-11-19 DIAGNOSIS — D649 Anemia, unspecified: Secondary | ICD-10-CM

## 2011-11-19 MED ORDER — SODIUM CHLORIDE 0.9 % IV SOLN
250.0000 mL | Freq: Once | INTRAVENOUS | Status: AC
  Administered 2011-11-19: 250 mL via INTRAVENOUS

## 2011-11-19 MED ORDER — ACETAMINOPHEN 325 MG PO TABS
650.0000 mg | ORAL_TABLET | Freq: Once | ORAL | Status: AC
  Administered 2011-11-19: 650 mg via ORAL

## 2011-11-19 MED ORDER — DIPHENHYDRAMINE HCL 25 MG PO CAPS
25.0000 mg | ORAL_CAPSULE | Freq: Once | ORAL | Status: AC
  Administered 2011-11-19: 25 mg via ORAL

## 2011-11-19 NOTE — Patient Instructions (Addendum)
Blood Products Information Blood Transfusion Information WHAT IS A BLOOD TRANSFUSION? A transfusion is the replacement of blood or some of its parts. Blood is made up of multiple cells which provide different functions.  Red blood cells carry oxygen and are used for blood loss replacement.   White blood cells fight against infection.   Platelets control bleeding.   Plasma helps clot blood.   Other blood products are available for specialized needs, such as hemophilia or other clotting disorders.  BEFORE THE TRANSFUSION  Who gives blood for transfusions?   You may be able to donate blood to be used at a later date on yourself (autologous donation).   Relatives can be asked to donate blood. This is generally not any safer than if you have received blood from a stranger. The same precautions are taken to ensure safety when a relative's blood is donated.   Healthy volunteers who are fully evaluated to make sure their blood is safe. This is blood bank blood.  Transfusion therapy is the safest it has ever been in the practice of medicine. Before blood is taken from a donor, a complete history is taken to make sure that person has no history of diseases nor engages in risky social behavior (examples are intravenous drug use or sexual activity with multiple partners). The donor's travel history is screened to minimize risk of transmitting infections, such as malaria. The donated blood is tested for signs of infectious diseases, such as HIV and hepatitis. The blood is then tested to be sure it is compatible with you in order to minimize the chance of a transfusion reaction. If you or a relative donates blood, this is often done in anticipation of surgery and is not appropriate for emergency situations. It takes many days to process the donated blood. RISKS AND COMPLICATIONS Although transfusion therapy is very safe and saves many lives, the main dangers of transfusion include:   Getting an  infectious disease.   Developing a transfusion reaction. This is an allergic reaction to something in the blood you were given. Every precaution is taken to prevent this.  The decision to have a blood transfusion has been considered carefully by your caregiver before blood is given. Blood is not given unless the benefits outweigh the risks. AFTER THE TRANSFUSION  Right after receiving a blood transfusion, you will usually feel much better and more energetic. This is especially true if your red blood cells have gotten low (anemic). The transfusion raises the level of the red blood cells which carry oxygen, and this usually causes an energy increase.   The nurse administering the transfusion will monitor you carefully for complications.  HOME CARE INSTRUCTIONS  No special instructions are needed after a transfusion. You may find your energy is better. Speak with your caregiver about any limitations on activity for underlying diseases you may have. SEEK MEDICAL CARE IF:   Your condition is not improving after your transfusion.   You develop redness or irritation at the intravenous (IV) site.  SEEK IMMEDIATE MEDICAL CARE IF:  Any of the following symptoms occur over the next 12 hours:  Shaking chills.   You have a temperature by mouth above 102 F (38.9 C), not controlled by medicine.   Chest, back, or muscle pain.   People around you feel you are not acting correctly or are confused.   Shortness of breath or difficulty breathing.   Dizziness and fainting.   You get a rash or develop hives.   You  have a decrease in urine output.   Your urine turns a dark color or changes to pink, red, or brown.  Any of the following symptoms occur over the next 10 days:  You have a temperature by mouth above 102 F (38.9 C), not controlled by medicine.   Shortness of breath.   Weakness after normal activity.   The white part of the eye turns yellow (jaundice).   You have a decrease in the  amount of urine or are urinating less often.   Your urine turns a dark color or changes to pink, red, or brown.  Document Released: 04/12/2000 Document Revised: 04/04/2011 Document Reviewed: 11/30/2007 [facility] Patient Information 2012 [place], [place].

## 2011-11-20 ENCOUNTER — Ambulatory Visit (HOSPITAL_BASED_OUTPATIENT_CLINIC_OR_DEPARTMENT_OTHER): Payer: Medicare Other

## 2011-11-20 ENCOUNTER — Other Ambulatory Visit: Payer: Self-pay | Admitting: Oncology

## 2011-11-20 VITALS — BP 156/63 | HR 65 | Temp 97.5°F | Resp 20

## 2011-11-20 DIAGNOSIS — D649 Anemia, unspecified: Secondary | ICD-10-CM

## 2011-11-20 DIAGNOSIS — D638 Anemia in other chronic diseases classified elsewhere: Secondary | ICD-10-CM

## 2011-11-20 DIAGNOSIS — D469 Myelodysplastic syndrome, unspecified: Secondary | ICD-10-CM

## 2011-11-20 MED ORDER — EPOETIN ALFA 40000 UNIT/ML IJ SOLN
40000.0000 [IU] | Freq: Once | INTRAMUSCULAR | Status: AC
  Administered 2011-11-20: 40000 [IU] via SUBCUTANEOUS
  Filled 2011-11-20: qty 2

## 2011-11-20 MED ORDER — DIPHENHYDRAMINE HCL 25 MG PO CAPS
25.0000 mg | ORAL_CAPSULE | Freq: Once | ORAL | Status: AC
  Administered 2011-11-20: 25 mg via ORAL

## 2011-11-20 MED ORDER — SODIUM CHLORIDE 0.9 % IV SOLN
250.0000 mL | Freq: Once | INTRAVENOUS | Status: AC
  Administered 2011-11-20: 250 mL via INTRAVENOUS

## 2011-11-20 MED ORDER — ACETAMINOPHEN 325 MG PO TABS
650.0000 mg | ORAL_TABLET | Freq: Once | ORAL | Status: AC
  Administered 2011-11-20: 650 mg via ORAL

## 2011-11-20 NOTE — Patient Instructions (Signed)
Blood Transfusion Information  WHAT IS A BLOOD TRANSFUSION?  A transfusion is the replacement of blood or some of its parts. Blood is made up of multiple cells which provide different functions.   Red blood cells carry oxygen and are used for blood loss replacement.   White blood cells fight against infection.   Platelets control bleeding.   Plasma helps clot blood.   Other blood products are available for specialized needs, such as hemophilia or other clotting disorders.  BEFORE THE TRANSFUSION   Who gives blood for transfusions?    You may be able to donate blood to be used at a later date on yourself (autologous donation).   Relatives can be asked to donate blood. This is generally not any safer than if you have received blood from a stranger. The same precautions are taken to ensure safety when a relative's blood is donated.   Healthy volunteers who are fully evaluated to make sure their blood is safe. This is blood bank blood.  Transfusion therapy is the safest it has ever been in the practice of medicine. Before blood is taken from a donor, a complete history is taken to make sure that person has no history of diseases nor engages in risky social behavior (examples are intravenous drug use or sexual activity with multiple partners). The donor's travel history is screened to minimize risk of transmitting infections, such as malaria. The donated blood is tested for signs of infectious diseases, such as HIV and hepatitis. The blood is then tested to be sure it is compatible with you in order to minimize the chance of a transfusion reaction. If you or a relative donates blood, this is often done in anticipation of surgery and is not appropriate for emergency situations. It takes many days to process the donated blood.  RISKS AND COMPLICATIONS  Although transfusion therapy is very safe and saves many lives, the main dangers of transfusion include:    Getting an infectious disease.   Developing a  transfusion reaction. This is an allergic reaction to something in the blood you were given. Every precaution is taken to prevent this.  The decision to have a blood transfusion has been considered carefully by your caregiver before blood is given. Blood is not given unless the benefits outweigh the risks.  AFTER THE TRANSFUSION   Right after receiving a blood transfusion, you will usually feel much better and more energetic. This is especially true if your red blood cells have gotten low (anemic). The transfusion raises the level of the red blood cells which carry oxygen, and this usually causes an energy increase.   The nurse administering the transfusion will monitor you carefully for complications.  HOME CARE INSTRUCTIONS   No special instructions are needed after a transfusion. You may find your energy is better. Speak with your caregiver about any limitations on activity for underlying diseases you may have.  SEEK MEDICAL CARE IF:    Your condition is not improving after your transfusion.   You develop redness or irritation at the intravenous (IV) site.  SEEK IMMEDIATE MEDICAL CARE IF:   Any of the following symptoms occur over the next 12 hours:   Shaking chills.   You have a temperature by mouth above 102 F (38.9 C), not controlled by medicine.   Chest, back, or muscle pain.   People around you feel you are not acting correctly or are confused.   Shortness of breath or difficulty breathing.   Dizziness and fainting.     You get a rash or develop hives.   You have a decrease in urine output.   Your urine turns a dark color or changes to pink, red, or brown.  Any of the following symptoms occur over the next 10 days:   You have a temperature by mouth above 102 F (38.9 C), not controlled by medicine.   Shortness of breath.   Weakness after normal activity.   The white part of the eye turns yellow (jaundice).   You have a decrease in the amount of urine or are urinating less often.   Your  urine turns a dark color or changes to pink, red, or brown.  Document Released: 04/12/2000 Document Revised: 04/04/2011 Document Reviewed: 11/30/2007  [employer] Patient Information 2012 [employer].

## 2011-11-20 NOTE — Progress Notes (Signed)
1610- Attempted to flush saline locked IV left in from blood transfusion done 11/19/11.  Unable to flush/get blood return from IV site.  Slight redness at insertion site.  Pt denies any pain, no warmth at site.  IV removed.  Advised pt if site worsens or begins to hurt to let MD know.  Pt verbalized understanding-dhp, rn

## 2011-11-21 LAB — TYPE AND SCREEN
Antibody Screen: NEGATIVE
Unit division: 0

## 2011-11-25 ENCOUNTER — Ambulatory Visit (INDEPENDENT_AMBULATORY_CARE_PROVIDER_SITE_OTHER): Payer: Medicare Other | Admitting: Family Medicine

## 2011-11-25 ENCOUNTER — Encounter: Payer: Self-pay | Admitting: Family Medicine

## 2011-11-25 VITALS — BP 140/78 | Temp 98.2°F | Wt 102.0 lb

## 2011-11-25 DIAGNOSIS — H6120 Impacted cerumen, unspecified ear: Secondary | ICD-10-CM

## 2011-11-25 DIAGNOSIS — E119 Type 2 diabetes mellitus without complications: Secondary | ICD-10-CM

## 2011-11-25 LAB — HEMOGLOBIN A1C: Hgb A1c MFr Bld: 7.8 % — ABNORMAL HIGH (ref 4.6–6.5)

## 2011-11-25 NOTE — Progress Notes (Signed)
  Subjective:    Patient ID: Meredith Rodriguez, female    DOB: Apr 04, 1926, 79 y.o.   MRN: [id number]  HPI  Patient seen for medical followup. She has history of anemia and probable myelodysplastic syndrome. Followed by hematology. Recent transfusion 2 units packed red blood cells. Also recent initiation of Procrit. Recent hemoglobin 8.2 prior to transfusion.  Type 2 diabetes. Controlled thus far with diet. Last A1c 7.4%. Fasting blood sugars mostly 110- 150 range. Postprandials usually about 150-200. No symptoms of hyperglycemia. Has some urine urgency which is unchanged.  Bilateral ear fullness. Uses hearing aids. History of cerumen impaction previously. No drainage.  Past Medical History  Diagnosis Date  . Hypertension   . Diabetes mellitus   . Hyperkalemia   . IBS (irritable bowel syndrome)   . Hypercholesterolemia   . Cervical radiculopathy   . Anemia    Past Surgical History  Procedure Date  . Varicose veins     surgey 1959  . Tonsilectomy, adenoidectomy, bilateral myringotomy and tubes   . Esophagogastroduodenoscopy 04/01/2011    Procedure: ESOPHAGOGASTRODUODENOSCOPY (EGD);  Surgeon: Barrie Folk, MD;  Location: [facility] ENDOSCOPY;  Service: Endoscopy;  Laterality: N/A;    reports that she has never smoked. She does not have any smokeless tobacco history on file. She reports that she does not drink alcohol or use illicit drugs. family history includes Cancer in her daughter and sisters; Diabetes in her sister; Hypertension in her father; and Stroke in her mother. Allergies  Allergen Reactions  . Amoxicillin (Amoxicillin)     Upset stomach  . Cephalexin Nausea Only  . Metformin And Related     Gi problems  . Phenergan (Promethazine Hcl) Other (See Comments)    Unknown   . Procardia (Nifedipine)     dizziness      Review of Systems  Constitutional: Positive for fatigue. Negative for fever and chills.  HENT: Positive for hearing loss. Negative for ear pain, congestion, tinnitus and  ear discharge.   Respiratory: Negative for shortness of breath.   Cardiovascular: Negative for chest pain, palpitations and leg swelling.  Genitourinary: Positive for urgency. Negative for dysuria.       Objective:   Physical Exam  Constitutional: She appears well-developed and well-nourished.  Neck: Neck supple.  Cardiovascular: Normal rate and regular rhythm.   Pulmonary/Chest: Effort normal and breath sounds normal. No respiratory distress. She has no wheezes. She has no rales.  Musculoskeletal: She exhibits no edema.          Assessment & Plan:  #1 type 2 diabetes. Recheck A1c. Relatively stable by home readings. Given her age and other comorbidities consider following off medications unless A1c is less than 8%  #2 bilateral cerumen impaction. Irrigation  #3 chronic anemia with question myelodysplastic syndrome. Followed by hematology

## 2011-11-27 NOTE — Progress Notes (Signed)
Quick Note:  Pt informed on home VM ______ 

## 2011-12-04 ENCOUNTER — Telehealth: Payer: Self-pay | Admitting: Oncology

## 2011-12-04 ENCOUNTER — Encounter: Payer: Self-pay | Admitting: *Deleted

## 2011-12-04 ENCOUNTER — Ambulatory Visit (HOSPITAL_BASED_OUTPATIENT_CLINIC_OR_DEPARTMENT_OTHER): Payer: Medicare Other | Admitting: Lab

## 2011-12-04 ENCOUNTER — Ambulatory Visit: Payer: Medicare Other

## 2011-12-04 DIAGNOSIS — D649 Anemia, unspecified: Secondary | ICD-10-CM

## 2011-12-04 LAB — CBC WITH DIFFERENTIAL/PLATELET
BASO%: 0.1 % (ref 0.0–2.0)
HCT: 34.1 % — ABNORMAL LOW (ref 34.8–46.6)
MCHC: 33.1 g/dL (ref 31.5–36.0)
MONO#: 0 10*3/uL — ABNORMAL LOW (ref 0.1–0.9)
NEUT%: 78.7 % — ABNORMAL HIGH (ref 38.4–76.8)
WBC: 8.1 10*3/uL (ref 3.9–10.3)
lymph#: 1.5 10*3/uL (ref 0.9–3.3)
nRBC: 0 % (ref 0–0)

## 2011-12-04 LAB — COMPREHENSIVE METABOLIC PANEL
ALT: 26 U/L (ref 0–35)
AST: 28 U/L (ref 0–37)
Albumin: 4.2 g/dL (ref 3.5–5.2)
BUN: 24 mg/dL — ABNORMAL HIGH (ref 6–23)
Calcium: 10 mg/dL (ref 8.4–10.5)
Chloride: 99 mEq/L (ref 96–112)
Potassium: 4 mEq/L (ref 3.5–5.3)
Sodium: 138 mEq/L (ref 135–145)
Total Protein: 7.2 g/dL (ref 6.0–8.3)

## 2011-12-04 MED ORDER — EPOETIN ALFA 20000 UNIT/ML IJ SOLN: 40000.0000 [IU] | Freq: Once | INTRAMUSCULAR | Status: DC

## 2011-12-04 NOTE — Telephone Encounter (Signed)
S/w the pt's dtr and she is aware of the appts on 12/18/2011@10 :15am.

## 2011-12-18 ENCOUNTER — Other Ambulatory Visit (HOSPITAL_BASED_OUTPATIENT_CLINIC_OR_DEPARTMENT_OTHER): Payer: Medicare Other | Admitting: Lab

## 2011-12-18 ENCOUNTER — Ambulatory Visit (HOSPITAL_BASED_OUTPATIENT_CLINIC_OR_DEPARTMENT_OTHER): Payer: Medicare Other

## 2011-12-18 VITALS — BP 153/60 | HR 84 | Temp 99.2°F

## 2011-12-18 DIAGNOSIS — D649 Anemia, unspecified: Secondary | ICD-10-CM

## 2011-12-18 DIAGNOSIS — D4620 Refractory anemia with excess of blasts, unspecified: Secondary | ICD-10-CM

## 2011-12-18 LAB — CBC WITH DIFFERENTIAL/PLATELET
BASO%: 0.1 % (ref 0.0–2.0)
EOS%: 1 % (ref 0.0–7.0)
MCH: 28.9 pg (ref 25.1–34.0)
MCHC: 32.5 g/dL (ref 31.5–36.0)
MONO%: 0.8 % (ref 0.0–14.0)
RBC: 3.04 10*6/uL — ABNORMAL LOW (ref 3.70–5.45)
RDW: 16 % — ABNORMAL HIGH (ref 11.2–14.5)
lymph#: 1.4 10*3/uL (ref 0.9–3.3)

## 2011-12-18 LAB — COMPREHENSIVE METABOLIC PANEL
AST: 22 U/L (ref 0–37)
Albumin: 4 g/dL (ref 3.5–5.2)
Alkaline Phosphatase: 39 U/L (ref 39–117)
BUN: 19 mg/dL (ref 6–23)
Potassium: 4.7 mEq/L (ref 3.5–5.3)
Sodium: 138 mEq/L (ref 135–145)
Total Bilirubin: 0.8 mg/dL (ref 0.3–1.2)
Total Protein: 6.4 g/dL (ref 6.0–8.3)

## 2011-12-18 MED ORDER — EPOETIN ALFA 40000 UNIT/ML IJ SOLN
40000.0000 [IU] | Freq: Once | INTRAMUSCULAR | Status: AC
  Administered 2011-12-18: 40000 [IU] via SUBCUTANEOUS
  Filled 2011-12-18: qty 1

## 2011-12-26 ENCOUNTER — Ambulatory Visit (HOSPITAL_BASED_OUTPATIENT_CLINIC_OR_DEPARTMENT_OTHER): Payer: Medicare Other | Admitting: Oncology

## 2011-12-26 ENCOUNTER — Other Ambulatory Visit (HOSPITAL_BASED_OUTPATIENT_CLINIC_OR_DEPARTMENT_OTHER): Payer: Medicare Other | Admitting: Lab

## 2011-12-26 ENCOUNTER — Ambulatory Visit: Payer: Medicare Other

## 2011-12-26 ENCOUNTER — Ambulatory Visit: Payer: Medicare Other | Admitting: Lab

## 2011-12-26 ENCOUNTER — Encounter: Payer: Self-pay | Admitting: Oncology

## 2011-12-26 ENCOUNTER — Telehealth: Payer: Self-pay | Admitting: Oncology

## 2011-12-26 ENCOUNTER — Encounter (HOSPITAL_COMMUNITY)
Admission: RE | Admit: 2011-12-26 | Discharge: 2011-12-26 | Disposition: A | Discharge status: Home / Self Care | Payer: Medicare Other | Source: Ambulatory Visit | Attending: Oncology | Admitting: Oncology

## 2011-12-26 VITALS — BP 149/67 | HR 75 | Temp 98.4°F | Resp 18 | Ht 65.0 in | Wt 100.0 lb

## 2011-12-26 DIAGNOSIS — D72829 Elevated white blood cell count, unspecified: Secondary | ICD-10-CM

## 2011-12-26 DIAGNOSIS — D4620 Refractory anemia with excess of blasts, unspecified: Secondary | ICD-10-CM

## 2011-12-26 DIAGNOSIS — E119 Type 2 diabetes mellitus without complications: Secondary | ICD-10-CM

## 2011-12-26 DIAGNOSIS — D649 Anemia, unspecified: Secondary | ICD-10-CM

## 2011-12-26 LAB — IRON AND TIBC
Iron: 211 ug/dL — ABNORMAL HIGH (ref 42–145)
UIBC: <15 ug/dL — ABNORMAL LOW (ref 125–400)

## 2011-12-26 LAB — CBC WITH DIFFERENTIAL/PLATELET
Basophils Absolute: 0 10*3/uL (ref 0.0–0.1)
Eosinophils Absolute: 0.1 10*3/uL (ref 0.0–0.5)
HGB: 7.8 g/dL — ABNORMAL LOW (ref 11.6–15.9)
MCV: 88.2 fL (ref 79.5–101.0)
MONO#: 0.1 10*3/uL (ref 0.1–0.9)
NEUT#: 7.3 10*3/uL — ABNORMAL HIGH (ref 1.5–6.5)
RBC: 2.71 10*6/uL — ABNORMAL LOW (ref 3.70–5.45)
RDW: 16.5 % — ABNORMAL HIGH (ref 11.2–14.5)
WBC: 9 10*3/uL (ref 3.9–10.3)
lymph#: 1.5 10*3/uL (ref 0.9–3.3)
nRBC: 0 % (ref 0–0)

## 2011-12-26 LAB — TECHNOLOGIST REVIEW

## 2011-12-26 LAB — PREPARE RBC (CROSSMATCH)

## 2011-12-26 NOTE — Telephone Encounter (Signed)
S/w the pt and she is aware of her blood transfusion appt for 12/27/2011@7 :30am

## 2011-12-26 NOTE — Progress Notes (Signed)
OFFICE PROGRESS NOTE  CC  [name], MD [address] [place] [address]  DIAGNOSIS: 78 year old female with severe and profound anemia likely due to a primary bone marrow problem. Differential does include myelodysplastic syndrome and anemia of chronic disease or  PRIOR THERAPY:  #1 patient is status post packed red cell transfusion during recent hospitalization.  CURRENT THERAPY: supportive  blood transfusions and to start procrit 40,000 units q 2 weeks beginning on 11/20/11  INTERVAL HISTORY: Meredith Rodriguez 78 y.o. female returns for Followup visit today.she is accompanied by her daughter today. She has dizziness.She is complaining of aches and pains she has also complaining of some shortness of breath and a cough. She otherwise has no nausea or vomiting she is she tired fatigued. She states that she feels like she may need another transfusion. She had a CBC performed today. She has not had any bleeding problems no hematuria hematochezia melena hemoptysis or hematemesis. Remainder of the 10 point review of systems is negative. MEDICAL HISTORY: Past Medical History  Diagnosis Date  . Hypertension   . Diabetes mellitus   . Hyperkalemia   . IBS (irritable bowel syndrome)   . Hypercholesterolemia   . Cervical radiculopathy   . Anemia     ALLERGIES:  is allergic to amoxicillin; cephalexin; metformin and related; phenergan; and procardia.  MEDICATIONS:  Current Outpatient Prescriptions  Medication Sig Dispense Refill  . amLODipine (NORVASC) 5 MG tablet       . Ascorbic Acid (VITAMIN C) 500 MG tablet Take 500 mg by mouth daily.        Marland Kitchen aspirin 81 MG tablet Take 81 mg by mouth daily.        . cetirizine (ZYRTEC) 10 MG chewable tablet Chew 1 tablet (10 mg total) by mouth daily.  30 tablet  1  . conjugated estrogens (PREMARIN) vaginal cream Place 0.5 g vaginally once a week. Sunday.      Marland Kitchen glucose blood test strip Twice daily as instructed  100 each  3  . Lancets  (ACCU-CHEK SOFT TOUCH) lancets Use as instructed twice daily  100 each  12  . metoprolol (LOPRESSOR) 50 MG tablet Take 50 mg by mouth 2 (two) times daily.      . Omega-3 Fatty Acids (FISH OIL) 1000 MG CAPS Take 1,000 mg by mouth daily.       . simvastatin (ZOCOR) 5 MG tablet TAKE 1 TABLET BY MOUTH AT BEDTIME  90 tablet  3  . DISCONTD: glimepiride (AMARYL) 2 MG tablet Take 1 tablet (2 mg total) by mouth every morning.  30 tablet  11   Current Facility-Administered Medications  Medication Dose Route Frequency Provider Last Rate Last Dose  . TDaP (BOOSTRIX) injection 0.5 mL  0.5 mL Intramuscular Once [name], MD        SURGICAL HISTORY:  Past Surgical History  Procedure Date  . Varicose veins     surgey 1959  . Tonsilectomy, adenoidectomy, bilateral myringotomy and tubes   . Esophagogastroduodenoscopy 04/01/2011    Procedure: ESOPHAGOGASTRODUODENOSCOPY (EGD);  Surgeon: Barrie Folk, MD;  Location: [facility] ENDOSCOPY;  Service: Endoscopy;  Laterality: N/A;    REVIEW OF SYSTEMS:  Pertinent items are noted in HPI.   PHYSICAL EXAMINATION: General appearance: alert, cooperative, appears stated age, flushed, mild distress and pale Head: Normocephalic, without obvious abnormality, atraumatic Neck: no adenopathy, no carotid bruit, no JVD, supple, symmetrical, trachea midline and thyroid not enlarged, symmetric, no tenderness/mass/nodules Lymph nodes: Cervical, supraclavicular, and  axillary nodes normal. Resp: clear to auscultation bilaterally and normal percussion bilaterally Back: symmetric, no curvature. ROM normal. No CVA tenderness. Cardio: regular rate and rhythm, S1, S2 normal, no murmur, click, rub or gallop and normal apical impulse GI: soft, non-tender; bowel sounds normal; no masses,  no organomegaly Extremities: extremities normal, atraumatic, no cyanosis or edema Neurologic: Alert and oriented X 3, normal strength and tone. Normal symmetric reflexes. Normal coordination and  gait  ECOG PERFORMANCE STATUS: 2 - Symptomatic, <50% confined to bed  Blood pressure 149/67, pulse 75, temperature 98.4 F (36.9 C), temperature source Oral, resp. rate 18, height 5\' 5"  (1.651 m), weight 100 lb (45.36 kg).  LABORATORY DATA: Lab Results  Component Value Date   WBC 9.0 12/26/2011   HGB 7.8* 12/26/2011   HCT 23.9* 12/26/2011   MCV 88.2 12/26/2011   PLT 347 Large & giant platelets 12/26/2011      Chemistry      Component Value Date/Time   NA 138 12/18/2011 1018   K 4.7 12/18/2011 1018   CL 103 12/18/2011 1018   CO2 30 12/18/2011 1018   BUN 19 12/18/2011 1018   CREATININE 0.70 12/18/2011 1018   CREATININE 0.69 07/18/2010 1202      Component Value Date/Time   CALCIUM 9.5 12/18/2011 1018   ALKPHOS 39 12/18/2011 1018   AST 22 12/18/2011 1018   ALT 26 12/18/2011 1018   BILITOT 0.8 12/18/2011 1018       RADIOGRAPHIC STUDIES:  No results found.  ASSESSMENT: 78 year old female with:  1.  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  2. Rare blasts on smear will evaluate further discussed this with the daughter today  3. Patient and family aware that she is at risk for iron overload and that we will continue to monitor this.  3. Diabetes    PLAN:   1. we will transfuse 2 units of packed red cells for symptomatic anemia.   #2 patient will continue procrit  3. Rare blasts on peripheral smear send peripheral blood flow cytometry for acute leukemia  #4 I will plan on seeing her back in one month's time.  All questions were answered. The patient knows to call the clinic with any problems, questions or concerns. We can certainly see the patient much sooner if necessary.  I spent 25 minutes counseling the patient face to face. The total time spent in the appointment was 30 minutes.    Drue Second, MD Medical/Oncology [facility] [phone] (beeper) [phone]  (Office)  12/26/2011, 2:36 PM

## 2011-12-26 NOTE — Patient Instructions (Addendum)
Transfusion of blood in the next day or two  I will see you back in 1 month for follow up   Continue coming for procrit injections

## 2011-12-27 ENCOUNTER — Ambulatory Visit (HOSPITAL_BASED_OUTPATIENT_CLINIC_OR_DEPARTMENT_OTHER): Payer: Medicare Other

## 2011-12-27 VITALS — BP 153/64 | HR 70 | Temp 98.4°F | Resp 16

## 2011-12-27 DIAGNOSIS — D649 Anemia, unspecified: Secondary | ICD-10-CM

## 2011-12-27 LAB — PREPARE RBC (CROSSMATCH)

## 2011-12-27 MED ORDER — DIPHENHYDRAMINE HCL 25 MG PO CAPS
25.0000 mg | ORAL_CAPSULE | Freq: Once | ORAL | Status: AC
  Administered 2011-12-27: 25 mg via ORAL

## 2011-12-27 MED ORDER — SODIUM CHLORIDE 0.9 % IV SOLN
250.0000 mL | Freq: Once | INTRAVENOUS | Status: AC
  Administered 2011-12-27: 250 mL via INTRAVENOUS

## 2011-12-27 MED ORDER — ACETAMINOPHEN 325 MG PO TABS
650.0000 mg | ORAL_TABLET | Freq: Once | ORAL | Status: AC
  Administered 2011-12-27: 650 mg via ORAL

## 2011-12-27 NOTE — Patient Instructions (Addendum)
Blood Transfusion Information  WHAT IS A BLOOD TRANSFUSION?  A transfusion is the replacement of blood or some of its parts. Blood is made up of multiple cells which provide different functions.   Red blood cells carry oxygen and are used for blood loss replacement.   White blood cells fight against infection.   Platelets control bleeding.   Plasma helps clot blood.   Other blood products are available for specialized needs, such as hemophilia or other clotting disorders.  BEFORE THE TRANSFUSION   Who gives blood for transfusions?    You may be able to donate blood to be used at a later date on yourself (autologous donation).   Relatives can be asked to donate blood. This is generally not any safer than if you have received blood from a stranger. The same precautions are taken to ensure safety when a relative's blood is donated.   Healthy volunteers who are fully evaluated to make sure their blood is safe. This is blood bank blood.  Transfusion therapy is the safest it has ever been in the practice of medicine. Before blood is taken from a donor, a complete history is taken to make sure that person has no history of diseases nor engages in risky social behavior (examples are intravenous drug use or sexual activity with multiple partners). The donor's travel history is screened to minimize risk of transmitting infections, such as malaria. The donated blood is tested for signs of infectious diseases, such as HIV and hepatitis. The blood is then tested to be sure it is compatible with you in order to minimize the chance of a transfusion reaction. If you or a relative donates blood, this is often done in anticipation of surgery and is not appropriate for emergency situations. It takes many days to process the donated blood.  RISKS AND COMPLICATIONS  Although transfusion therapy is very safe and saves many lives, the main dangers of transfusion include:    Getting an infectious disease.   Developing a  transfusion reaction. This is an allergic reaction to something in the blood you were given. Every precaution is taken to prevent this.  The decision to have a blood transfusion has been considered carefully by your caregiver before blood is given. Blood is not given unless the benefits outweigh the risks.  AFTER THE TRANSFUSION   Right after receiving a blood transfusion, you will usually feel much better and more energetic. This is especially true if your red blood cells have gotten low (anemic). The transfusion raises the level of the red blood cells which carry oxygen, and this usually causes an energy increase.   The nurse administering the transfusion will monitor you carefully for complications.  HOME CARE INSTRUCTIONS   No special instructions are needed after a transfusion. You may find your energy is better. Speak with your caregiver about any limitations on activity for underlying diseases you may have.  SEEK MEDICAL CARE IF:    Your condition is not improving after your transfusion.   You develop redness or irritation at the intravenous (IV) site.  SEEK IMMEDIATE MEDICAL CARE IF:   Any of the following symptoms occur over the next 12 hours:   Shaking chills.   You have a temperature by mouth above 102 F (38.9 C), not controlled by medicine.   Chest, back, or muscle pain.   People around you feel you are not acting correctly or are confused.   Shortness of breath or difficulty breathing.   Dizziness and fainting.     You get a rash or develop hives.   You have a decrease in urine output.   Your urine turns a dark color or changes to pink, red, or brown.  Any of the following symptoms occur over the next 10 days:   You have a temperature by mouth above 102 F (38.9 C), not controlled by medicine.   Shortness of breath.   Weakness after normal activity.   The white part of the eye turns yellow (jaundice).   You have a decrease in the amount of urine or are urinating less often.   Your  urine turns a dark color or changes to pink, red, or brown.  Document Released: 04/12/2000 Document Revised: 04/04/2011 Document Reviewed: 11/30/2007  ExitCare Patient Information 2012 [employer].

## 2011-12-28 LAB — TYPE AND SCREEN
ABO/RH(D): AB NEG
Unit division: 0

## 2012-01-01 ENCOUNTER — Ambulatory Visit (HOSPITAL_BASED_OUTPATIENT_CLINIC_OR_DEPARTMENT_OTHER): Payer: Medicare Other | Admitting: Lab

## 2012-01-01 ENCOUNTER — Ambulatory Visit: Payer: Medicare Other

## 2012-01-01 DIAGNOSIS — D649 Anemia, unspecified: Secondary | ICD-10-CM

## 2012-01-01 LAB — CBC WITH DIFFERENTIAL/PLATELET
BASO%: 0 % (ref 0.0–2.0)
Basophils Absolute: 0 10*3/uL (ref 0.0–0.1)
EOS%: 1.9 % (ref 0.0–7.0)
HGB: 11.9 g/dL (ref 11.6–15.9)
MCH: 29.8 pg (ref 25.1–34.0)
MCHC: 33 g/dL (ref 31.5–36.0)
RBC: 3.99 10*6/uL (ref 3.70–5.45)
RDW: 15.9 % — ABNORMAL HIGH (ref 11.2–14.5)
lymph#: 1.3 10*3/uL (ref 0.9–3.3)
nRBC: 0 % (ref 0–0)

## 2012-01-01 MED ORDER — EPOETIN ALFA 40000 UNIT/ML IJ SOLN
40000.0000 [IU] | Freq: Once | INTRAMUSCULAR | Status: DC
  Filled 2012-01-01: qty 1

## 2012-01-06 ENCOUNTER — Telehealth: Payer: Self-pay | Admitting: Family Medicine

## 2012-01-06 NOTE — Telephone Encounter (Signed)
Caller: Tanaka/Patient; Patient Name: Kaylyn Lim; PCP: Evelena Peat [facility]); Calling about having a bat flying around  in the livingroom on 01/05/12 @ 1730. They think it may have been amounst some old quilts that [place]12.  Animal Control came into house and they could not find it and left. Bat found next to mirror in livingroom that night at 2100-01/05/12 and husband got it in a box and let it go outside. Neither one touched it. She is wondering if she needs to have rabies series. No new cuts or marks on the skin. Triage per Bites Protocol and advised that since bat may have been in the house overnight that they really should be checked in the ER. Contacted Officer Patterson with [employer] and he referred Priya to speak with Ronda Fairly, [phone] who works with [employer] with re: rabies exposure/  [employer] Phone Number: [phone] She will call back if rabies series advised to see if Dr.Burchette is in agreement.

## 2012-01-06 NOTE — Telephone Encounter (Signed)
Meredith Rodriguez, please have Dr. Caryl Never reviews below and advise. Thanks.

## 2012-01-06 NOTE — Telephone Encounter (Signed)
With bat exposure in house at night, small bites could be easy to miss.  Even though risks are fairly low, bats can carry rabies and undetected exposure could be fatal.  I think the only safe course of action is to go ahead with rabies vaccination.

## 2012-01-06 NOTE — Telephone Encounter (Signed)
Pt was given Dr Lucie Leather advice, she is reluctant to go to ER, questioning if animal control could check if the bat had rabies first?  Again, I explained bats that carry rabies can be fatal, small bites can be missed.  Pt voiced her understanding.

## 2012-01-15 ENCOUNTER — Ambulatory Visit (HOSPITAL_BASED_OUTPATIENT_CLINIC_OR_DEPARTMENT_OTHER): Payer: Medicare Other

## 2012-01-15 ENCOUNTER — Other Ambulatory Visit (HOSPITAL_COMMUNITY)
Admission: RE | Admit: 2012-01-15 | Discharge: 2012-01-15 | Disposition: A | Discharge status: Home / Self Care | Payer: Medicare Other | Source: Ambulatory Visit | Attending: Oncology | Admitting: Oncology

## 2012-01-15 ENCOUNTER — Other Ambulatory Visit (HOSPITAL_BASED_OUTPATIENT_CLINIC_OR_DEPARTMENT_OTHER): Payer: Medicare Other | Admitting: Lab

## 2012-01-15 VITALS — BP 144/56 | HR 73 | Temp 97.0°F

## 2012-01-15 DIAGNOSIS — D649 Anemia, unspecified: Secondary | ICD-10-CM

## 2012-01-15 DIAGNOSIS — D4620 Refractory anemia with excess of blasts, unspecified: Secondary | ICD-10-CM

## 2012-01-15 LAB — BASIC METABOLIC PANEL (CC13)
CO2: 25 mEq/L (ref 22–29)
Chloride: 103 mEq/L (ref 98–107)
Sodium: 139 mEq/L (ref 136–145)

## 2012-01-15 LAB — CBC WITH DIFFERENTIAL/PLATELET
BASO%: 0.1 % (ref 0.0–2.0)
Eosinophils Absolute: 0.1 10*3/uL (ref 0.0–0.5)
MCHC: 31.9 g/dL (ref 31.5–36.0)
MONO#: 0.1 10*3/uL (ref 0.1–0.9)
NEUT#: 7.3 10*3/uL — ABNORMAL HIGH (ref 1.5–6.5)
RBC: 3.4 10*6/uL — ABNORMAL LOW (ref 3.70–5.45)
RDW: 16.8 % — ABNORMAL HIGH (ref 11.2–14.5)
WBC: 9.1 10*3/uL (ref 3.9–10.3)
lymph#: 1.6 10*3/uL (ref 0.9–3.3)
nRBC: 0 % (ref 0–0)

## 2012-01-15 LAB — TECHNOLOGIST REVIEW: Technologist Review: 1

## 2012-01-15 MED ORDER — EPOETIN ALFA 20000 UNIT/ML IJ SOLN
40000.0000 [IU] | Freq: Once | INTRAMUSCULAR | Status: AC
  Administered 2012-01-15: 40000 [IU] via SUBCUTANEOUS
  Filled 2012-01-15: qty 2

## 2012-01-23 ENCOUNTER — Telehealth: Payer: Self-pay | Admitting: Medical Oncology

## 2012-01-23 NOTE — Telephone Encounter (Signed)
Patient wants to know if it is ok to receive flu shot, "I went to get a flu shot yesterday and I was reading that paper they give you and it says you shouldn't get one if you have had blood transfusions in the past and I have had several so I didn't know what that meant."

## 2012-01-23 NOTE — Telephone Encounter (Signed)
She can have a flu shot 

## 2012-01-23 NOTE — Telephone Encounter (Signed)
LMOVM, per MD, ok for patient to get flu shot.  Instructed patient to call back with any questions or concerns.

## 2012-01-29 ENCOUNTER — Telehealth: Payer: Self-pay | Admitting: *Deleted

## 2012-01-29 ENCOUNTER — Other Ambulatory Visit (HOSPITAL_BASED_OUTPATIENT_CLINIC_OR_DEPARTMENT_OTHER): Payer: Medicare Other | Admitting: Lab

## 2012-01-29 ENCOUNTER — Ambulatory Visit: Payer: Medicare Other

## 2012-01-29 ENCOUNTER — Ambulatory Visit (HOSPITAL_BASED_OUTPATIENT_CLINIC_OR_DEPARTMENT_OTHER): Payer: Medicare Other | Admitting: Oncology

## 2012-01-29 ENCOUNTER — Encounter (HOSPITAL_COMMUNITY)
Admission: RE | Admit: 2012-01-29 | Discharge: 2012-01-29 | Disposition: A | Discharge status: Home / Self Care | Payer: Medicare Other | Source: Ambulatory Visit | Attending: Oncology | Admitting: Oncology

## 2012-01-29 VITALS — BP 150/61 | HR 79 | Temp 98.2°F | Resp 20 | Ht 65.0 in | Wt 99.8 lb

## 2012-01-29 DIAGNOSIS — D469 Myelodysplastic syndrome, unspecified: Secondary | ICD-10-CM

## 2012-01-29 DIAGNOSIS — D4620 Refractory anemia with excess of blasts, unspecified: Secondary | ICD-10-CM

## 2012-01-29 DIAGNOSIS — C9500 Acute leukemia of unspecified cell type not having achieved remission: Secondary | ICD-10-CM

## 2012-01-29 DIAGNOSIS — C9200 Acute myeloblastic leukemia, not having achieved remission: Secondary | ICD-10-CM | POA: Insufficient documentation

## 2012-01-29 DIAGNOSIS — D649 Anemia, unspecified: Secondary | ICD-10-CM

## 2012-01-29 LAB — CBC WITH DIFFERENTIAL/PLATELET
BASO%: 0.1 % (ref 0.0–2.0)
Eosinophils Absolute: 0.1 10*3/uL (ref 0.0–0.5)
HCT: 22.6 % — ABNORMAL LOW (ref 34.8–46.6)
LYMPH%: 20.2 % (ref 14.0–49.7)
MCHC: 32.7 g/dL (ref 31.5–36.0)
MONO#: 0.2 10*3/uL (ref 0.1–0.9)
NEUT#: 8.5 10*3/uL — ABNORMAL HIGH (ref 1.5–6.5)
Platelets: 195 10*3/uL (ref 145–400)
RBC: 2.52 10*6/uL — ABNORMAL LOW (ref 3.70–5.45)
WBC: 10.9 10*3/uL — ABNORMAL HIGH (ref 3.9–10.3)
lymph#: 2.2 10*3/uL (ref 0.9–3.3)
nRBC: 0 % (ref 0–0)

## 2012-01-29 LAB — COMPREHENSIVE METABOLIC PANEL (CC13)
ALT: 26 U/L (ref 0–55)
AST: 26 U/L (ref 5–34)
Albumin: 4 g/dL (ref 3.5–5.0)
CO2: 24 mEq/L (ref 22–29)
Calcium: 9.5 mg/dL (ref 8.4–10.4)
Chloride: 100 mEq/L (ref 98–107)
Creatinine: 0.9 mg/dL (ref 0.6–1.1)
Potassium: 4.5 mEq/L (ref 3.5–5.1)
Total Protein: 7 g/dL (ref 6.4–8.3)

## 2012-01-29 LAB — TECHNOLOGIST REVIEW

## 2012-01-29 LAB — PREPARE RBC (CROSSMATCH)

## 2012-01-29 NOTE — Telephone Encounter (Signed)
Made patient appointment for 02-05-2012 starting at 11:30am

## 2012-01-29 NOTE — Telephone Encounter (Signed)
Walk in lab appt today for type and hold. Infusion appt for 2 units blood on 10/4 at 11:30. Pt aware  Placed patient on walk in list for the lab on 01-29-2012

## 2012-01-29 NOTE — Patient Instructions (Addendum)
We discussed that the MDS is transforming to acute Leukemia  Treatments options are chemotherapy.  Decitabine injection for infusion What is this medicine? DECITABINE (dee SYE ta been) is a chemotherapy drug. This medicine reduces the growth of cancer cells. It is used to treat adults with myelodysplastic syndromes. This medicine may be used for other purposes; ask your health care provider or pharmacist if you have questions. What should I tell my health care provider before I take this medicine? They need to know if you have any of these conditions: -infection (especially a virus infection such as chickenpox, cold sores, or herpes) -kidney disease -liver disease -an unusual or allergic reaction to decitabine, other medicines, foods, dyes, or preservatives -pregnant or trying to get pregnant -breast-feeding How should I use this medicine? This medicine is for infusion into a vein. It is administered in a hospital or clinic by a doctor or health care professional. Talk to your pediatrician regarding the use of this medicine in children. Special care may be needed. Overdosage: If you think you have taken too much of this medicine contact a poison control center or emergency room at once. NOTE: This medicine is only for you. Do not share this medicine with others. What if I miss a dose? It is important not to miss your dose. Call your doctor or health care professional if you are unable to keep an appointment. What may interact with this medicine? -vaccines Talk to your doctor or health care professional before taking any of these medicines: -aspirin -acetaminophen -ibuprofen -ketoprofen -naproxen This list may not describe all possible interactions. Give your health care provider a list of all the medicines, herbs, non-prescription drugs, or dietary supplements you use. Also tell them if you smoke, drink alcohol, or use illegal drugs. Some items may interact with your medicine. What  should I watch for while using this medicine? Visit your doctor for checks on your progress. This drug may make you feel generally unwell. This is not uncommon, as chemotherapy can affect healthy cells as well as cancer cells. Report any side effects. Continue your course of treatment even though you feel ill unless your doctor tells you to stop. In some cases, you may be given additional medicines to help with side effects. Follow all directions for their use. Call your doctor or health care professional for advice if you get a fever, chills or sore throat, or other symptoms of a cold or flu. Do not treat yourself. This drug decreases your body's ability to fight infections. Try to avoid being around people who are sick. This medicine may increase your risk to bruise or bleed. Call your doctor or health care professional if you notice any unusual bleeding. Be careful brushing and flossing your teeth or using a toothpick because you may get an infection or bleed more easily. If you have any dental work done, tell your dentist you are receiving this medicine. Avoid taking products that contain aspirin, acetaminophen, ibuprofen, naproxen, or ketoprofen unless instructed by your doctor. These medicines may hide a fever. Do not have any vaccinations without your doctor's approval and avoid anyone who has recently had oral polio vaccine. Do not become pregnant while taking this medicine. Women should inform their doctor if they wish to become pregnant or think they might be pregnant. There is a potential for serious side effects to an unborn child. Talk to your health care professional or pharmacist for more information. Do not breast-feed an infant while taking this medicine. Males  who take this medicine should use a condom to avoid pregnancy in their partner. Do not father a child during treatment, and for 2 months after treatment is finished. What side effects may I notice from receiving this medicine? Side  effects that you should report to your doctor or health care professional as soon as possible: -low blood counts - this medicine may decrease the number of white blood cells, red blood cells and platelets. You may be at increased risk for infections and bleeding. -signs of infection - fever or chills, cough, sore throat, pain or difficulty passing urine -signs of decreased platelets or bleeding - bruising, pinpoint red spots on the skin, black, tarry stools, blood in the urine -signs of decreased red blood cells - unusual weakness or tiredness, fainting spells, lightheadedness -increased blood sugar Side effects that usually do not require medical attention (report to your prescriber or health care professional if they continue or are bothersome): -constipation -diarrhea -headache -loss of appetite -nausea, vomiting -skin rash, itching -stomach pain -water retention -weak or tired This list may not describe all possible side effects. Call your doctor for medical advice about side effects. You may report side effects to FDA at 1-800-FDA-1088. Where should I keep my medicine? This drug is given in a hospital or clinic and will not be stored at home. NOTE: This sheet is a summary. It may not cover all possible information. If you have questions about this medicine, talk to your doctor, pharmacist, or health care provider.  2012, Elsevier/Gold Standard. (08/17/2007 4:33:42 PM)   Patient information: Acute myeloid leukemia (AML) (The Basics)View in SpanishWritten by the doctors and editors at UpToDate  What is acute myeloid leukemia? - Acute myeloid leukemia, called "AML," is a type of blood cancer. AML is usually fast-growing and needs to be treated quickly. Blood is made up of different types of cells. These cells are made in the middle of your bones, in a part called the bone marrow. When people have AML, their bone marrow makes abnormal blood cells instead of normal blood cells. These  abnormal blood cells grow out of control, get into the blood, and travel around the body. Sometimes, these cells collect in certain parts of the body. When the bone marrow makes abnormal blood cells, it does not make the normal blood cells a person's body needs. This can cause symptoms. What are the symptoms of AML? - The most common symptoms of AML include: Feeling very tired and weak  Bleeding more easily than normal  Getting sick from infections more easily than normal Is there a test for AML? - Yes. Your doctor or nurse will talk with you and do an exam. He or she will also do: Blood tests  Bone marrow biopsy - A doctor will take a very small sample of the bone marrow. Then another doctor will look at the cells under a microscope to see if abnormal (cancer) cells are present. There are different types of AML. The test results can show which type you have. The right treatment for you will depend a lot on your age and the type of AML you have. How is AML treated? - Treatment for AML usually includes 2 parts. The first part of treatment is called "induction of remission" and lasts about 4 weeks. During this part, people stay in the hospital and get chemotherapy. Chemotherapy is the term doctors use to describe a group of medicines that kill cancer cells.  Many people are in "remission" after the  chemotherapy. This means that doctors do not see any abnormal blood cells in the blood or bone marrow. But even though doctors do not see any abnormal cells, there are still abnormal cells in the body. To kill these cells and prevent the AML from returning, people need more treatment. The second part of treatment is called "post-remission therapy." During this time, people can have 1 or more of the following treatments: More chemotherapy - People stay in the hospital for a few days each month to get chemotherapy. This treatment can last 3 or 4 months.  Radiation therapy - Radiation kills cancer cells.  Bone  marrow transplant - This treatment replaces cells in the bone marrow that are killed by chemotherapy or radiation. These "donor" cells can come from different places, including: People who are related to you, and whose blood matches yours  People who are not related to you, but whose blood matches yours  Blood (that matches yours) from a newborn baby's umbilical cord  You - Your cells can be taken out of your bone marrow before your treatment is completed and put back in after you have completed chemotherapy or radiation treatment. What happens after treatment? - After treatment, your doctor will check you every so often to see if the cancer comes back. Regular follow-ups include talking with your doctor, exams, and blood tests. Sometimes, your doctor will also do a bone marrow biopsy. What happens if the AML comes back? - If the AML comes back, you might have more chemotherapy, radiation, or bone marrow transplantation. What else should I do? - It's important to follow all your doctor's instructions about visits and tests. It's also important to talk to your doctor about any side effects or problems you have during treatment. Getting treated for AML involves making many choices, such as what treatment to have.  Always let your doctors and nurses know how you feel about a treatment. Any time you are offered a treatment, ask: What are the benefits of this treatment? Is it likely to help me live longer? Will it reduce or prevent symptoms?  What are the downsides to this treatment?  Are there other options besides this treatment?  What happens if I do not have this treatment? More on this topic

## 2012-01-29 NOTE — Progress Notes (Signed)
OFFICE PROGRESS NOTE  CC  [name], MD [address] [place] [address]  DIAGNOSIS: 78 year old female with severe and profound anemia likely due to a primary bone marrow problem. Differential does include myelodysplastic syndrome and anemia of chronic disease or  PRIOR THERAPY:  #1 patient is status post packed red cell transfusion during recent hospitalization.  #2 patient has been on Procrit every 2 weeks unfortunately this has not effective and we will discontinue this.  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  CURRENT THERAPY: Patient will proceed with blood transfusion today we will hold Procrit do 2 patient converting to an acute leukemia  INTERVAL HISTORY: Meredith Rodriguez 78 y.o. female returns for Followup visit today.she is accompanied by her daughter today. She has dizziness.She is complaining of aches and pains she has also complaining of some shortness of breath and a cough. She otherwise has no nausea or vomiting she is she tired fatigued. She states that she feels like she may need another transfusion. She had a CBC performed today. She has not had any bleeding problems no hematuria hematochezia melena hemoptysis or hematemesis. Remainder of the 10 point review of systems is negative. MEDICAL HISTORY: Past Medical History  Diagnosis Date  . Hypertension   . Diabetes mellitus   . Hyperkalemia   . IBS (irritable bowel syndrome)   . Hypercholesterolemia   . Cervical radiculopathy   . Anemia     ALLERGIES:  is allergic to amoxicillin; cephalexin; metformin and related; phenergan; and procardia.  MEDICATIONS:  Current Outpatient Prescriptions  Medication Sig Dispense Refill  . amLODipine (NORVASC) 5 MG tablet       . Ascorbic Acid (VITAMIN C) 500 MG tablet Take 500 mg by mouth daily.        Marland Kitchen aspirin 81 MG tablet Take 81 mg by mouth daily.        . cetirizine  (ZYRTEC) 10 MG chewable tablet Chew 1 tablet (10 mg total) by mouth daily.  30 tablet  1  . conjugated estrogens (PREMARIN) vaginal cream Place 0.5 g vaginally once a week. Sunday.      Marland Kitchen glucose blood test strip Twice daily as instructed  100 each  3  . Lancets (ACCU-CHEK SOFT TOUCH) lancets Use as instructed twice daily  100 each  12  . metoprolol (LOPRESSOR) 50 MG tablet Take 50 mg by mouth 2 (two) times daily.      . Omega-3 Fatty Acids (FISH OIL) 1000 MG CAPS Take 1,000 mg by mouth daily.       . simvastatin (ZOCOR) 5 MG tablet TAKE 1 TABLET BY MOUTH AT BEDTIME  90 tablet  3  . DISCONTD: glimepiride (AMARYL) 2 MG tablet Take 1 tablet (2 mg total) by mouth every morning.  30 tablet  11   Current Facility-Administered Medications  Medication Dose Route Frequency Provider Last Rate Last Dose  . TDaP (BOOSTRIX) injection 0.5 mL  0.5 mL Intramuscular Once [name], MD        SURGICAL HISTORY:  Past Surgical History  Procedure Date  . Varicose veins     surgey 1959  . Tonsilectomy, adenoidectomy, bilateral myringotomy and tubes   . Esophagogastroduodenoscopy 04/01/2011    Procedure: ESOPHAGOGASTRODUODENOSCOPY (EGD);  Surgeon: Barrie Folk, MD;  Location: [facility] ENDOSCOPY;  Service: Endoscopy;  Laterality: N/A;    REVIEW OF SYSTEMS:  Pertinent items are noted in HPI.   PHYSICAL EXAMINATION:  General appearance: alert, cooperative, appears stated age, flushed, mild distress and pale Head: Normocephalic, without obvious abnormality, atraumatic Neck: no adenopathy, no carotid bruit, no JVD, supple, symmetrical, trachea midline and thyroid not enlarged, symmetric, no tenderness/mass/nodules Lymph nodes: Cervical, supraclavicular, and axillary nodes normal. Resp: clear to auscultation bilaterally and normal percussion bilaterally Back: symmetric, no curvature. ROM normal. No CVA tenderness. Cardio: regular rate and rhythm, S1, S2 normal, no murmur, click, rub or gallop and normal apical  impulse GI: soft, non-tender; bowel sounds normal; no masses,  no organomegaly Extremities: extremities normal, atraumatic, no cyanosis or edema Neurologic: Alert and oriented X 3, normal strength and tone. Normal symmetric reflexes. Normal coordination and gait  ECOG PERFORMANCE STATUS: 2 - Symptomatic, <50% confined to bed  Blood pressure 150/61, pulse 79, temperature 98.2 F (36.8 C), temperature source Oral, resp. rate 20, height 5\' 5"  (1.651 m), weight 99 lb 12.8 oz (45.269 kg).  LABORATORY DATA: Lab Results  Component Value Date   WBC 10.9* 01/29/2012   HGB 7.4* 01/29/2012   HCT 22.6* 01/29/2012   MCV 89.7 01/29/2012   PLT 195 Large & giant platelets 01/29/2012      Chemistry      Component Value Date/Time   NA 136 01/29/2012 1424   NA 138 12/18/2011 1018   K 4.5 01/29/2012 1424   K 4.7 12/18/2011 1018   CL 100 01/29/2012 1424   CL 103 12/18/2011 1018   CO2 24 01/29/2012 1424   CO2 30 12/18/2011 1018   BUN 25.0 01/29/2012 1424   BUN 19 12/18/2011 1018   CREATININE 0.9 01/29/2012 1424   CREATININE 0.70 12/18/2011 1018   CREATININE 0.69 07/18/2010 1202      Component Value Date/Time   CALCIUM 9.5 01/29/2012 1424   CALCIUM 9.5 12/18/2011 1018   ALKPHOS 48 01/29/2012 1424   ALKPHOS 39 12/18/2011 1018   AST 26 01/29/2012 1424   AST 22 12/18/2011 1018   ALT 26 01/29/2012 1424   ALT 26 12/18/2011 1018   BILITOT 1.10 01/29/2012 1424   BILITOT 0.8 12/18/2011 1018       RADIOGRAPHIC STUDIES:  No results found.  ASSESSMENT: 78 year old female with:  1.  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  2. 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 This is discussed with the daughter and the patient today recommendations were made as below   3. Patient and family aware that she is at risk for iron overload and that we will continue to monitor this.      PLAN:     1. we will transfuse 2 units of packed red cells for symptomatic anemia.   #2 patient is slowly transforming to acute leukemia. I have discussed this with the patient. We also discussed treatment options including transferring her care to [facility] versus doing something here such as dicatabine. Literature was given on both acute leukemia as well as on to different chemotherapeutic agents that can be utilized risks and benefits of these were discussed with the patient in great detail today.  #3 we will no longer give her Procrit injections.  All questions were answered. The patient knows to call the clinic with any problems, questions or concerns. We can certainly see the patient much sooner if necessary.  I spent 25 minutes counseling the patient face to face. The total time spent in the appointment was 30 minutes.    Drue Second,  MD Medical/Oncology [facility] [phone] (beeper) [phone] (Office)  01/29/2012, 4:22 PM

## 2012-01-31 ENCOUNTER — Ambulatory Visit (HOSPITAL_BASED_OUTPATIENT_CLINIC_OR_DEPARTMENT_OTHER): Payer: Medicare Other

## 2012-01-31 VITALS — BP 155/62 | HR 73 | Temp 98.8°F | Resp 16

## 2012-01-31 DIAGNOSIS — D649 Anemia, unspecified: Secondary | ICD-10-CM

## 2012-01-31 DIAGNOSIS — D469 Myelodysplastic syndrome, unspecified: Secondary | ICD-10-CM

## 2012-01-31 MED ORDER — SODIUM CHLORIDE 0.9 % IV SOLN
250.0000 mL | Freq: Once | INTRAVENOUS | Status: AC
  Administered 2012-01-31: 250 mL via INTRAVENOUS

## 2012-01-31 MED ORDER — ACETAMINOPHEN 325 MG PO TABS
650.0000 mg | ORAL_TABLET | Freq: Once | ORAL | Status: AC
  Administered 2012-01-31: 650 mg via ORAL

## 2012-01-31 MED ORDER — DIPHENHYDRAMINE HCL 25 MG PO CAPS
25.0000 mg | ORAL_CAPSULE | Freq: Once | ORAL | Status: AC
  Administered 2012-01-31: 25 mg via ORAL

## 2012-01-31 NOTE — Patient Instructions (Addendum)
Blood Transfusion Information WHAT IS A BLOOD TRANSFUSION? A transfusion is the replacement of blood or some of its parts. Blood is made up of multiple cells which provide different functions.  Red blood cells carry oxygen and are used for blood loss replacement.  White blood cells fight against infection.  Platelets control bleeding.  Plasma helps clot blood.  Other blood products are available for specialized needs, such as hemophilia or other clotting disorders. BEFORE THE TRANSFUSION  Who gives blood for transfusions?   You may be able to donate blood to be used at a later date on yourself (autologous donation).  Relatives can be asked to donate blood. This is generally not any safer than if you have received blood from a stranger. The same precautions are taken to ensure safety when a relative's blood is donated.  Healthy volunteers who are fully evaluated to make sure their blood is safe. This is blood bank blood. Transfusion therapy is the safest it has ever been in the practice of medicine. Before blood is taken from a donor, a complete history is taken to make sure that person has no history of diseases nor engages in risky social behavior (examples are intravenous drug use or sexual activity with multiple partners). The donor's travel history is screened to minimize risk of transmitting infections, such as malaria. The donated blood is tested for signs of infectious diseases, such as HIV and hepatitis. The blood is then tested to be sure it is compatible with you in order to minimize the chance of a transfusion reaction. If you or a relative donates blood, this is often done in anticipation of surgery and is not appropriate for emergency situations. It takes many days to process the donated blood. RISKS AND COMPLICATIONS Although transfusion therapy is very safe and saves many lives, the main dangers of transfusion include:   Getting an infectious disease.  Developing a  transfusion reaction. This is an allergic reaction to something in the blood you were given. Every precaution is taken to prevent this. The decision to have a blood transfusion has been considered carefully by your caregiver before blood is given. Blood is not given unless the benefits outweigh the risks. AFTER THE TRANSFUSION  Right after receiving a blood transfusion, you will usually feel much better and more energetic. This is especially true if your red blood cells have gotten low (anemic). The transfusion raises the level of the red blood cells which carry oxygen, and this usually causes an energy increase.  The nurse administering the transfusion will monitor you carefully for complications. HOME CARE INSTRUCTIONS  No special instructions are needed after a transfusion. You may find your energy is better. Speak with your caregiver about any limitations on activity for underlying diseases you may have. SEEK MEDICAL CARE IF:   Your condition is not improving after your transfusion.  You develop redness or irritation at the intravenous (IV) site. SEEK IMMEDIATE MEDICAL CARE IF:  Any of the following symptoms occur over the next 12 hours:  Shaking chills.  You have a temperature by mouth above 102 F (38.9 C), not controlled by medicine.  Chest, back, or muscle pain.  People around you feel you are not acting correctly or are confused.  Shortness of breath or difficulty breathing.  Dizziness and fainting.  You get a rash or develop hives.  You have a decrease in urine output.  Your urine turns a dark color or changes to pink, red, or brown. Any of the following   symptoms occur over the next 10 days:  You have a temperature by mouth above 102 F (38.9 C), not controlled by medicine.  Shortness of breath.  Weakness after normal activity.  The white part of the eye turns yellow (jaundice).  You have a decrease in the amount of urine or are urinating less often.  Your  urine turns a dark color or changes to pink, red, or brown. Document Released: 04/12/2000 Document Revised: 07/08/2011 Document Reviewed: 11/30/2007 Exit[employer] Patient Information 2013 [employer].  

## 2012-02-02 LAB — TYPE AND SCREEN
ABO/RH(D): AB NEG
Unit division: 0

## 2012-02-05 ENCOUNTER — Telehealth: Payer: Self-pay | Admitting: Oncology

## 2012-02-05 ENCOUNTER — Encounter: Payer: Self-pay | Admitting: Oncology

## 2012-02-05 ENCOUNTER — Ambulatory Visit (HOSPITAL_BASED_OUTPATIENT_CLINIC_OR_DEPARTMENT_OTHER): Payer: Medicare Other | Admitting: Oncology

## 2012-02-05 ENCOUNTER — Other Ambulatory Visit (HOSPITAL_BASED_OUTPATIENT_CLINIC_OR_DEPARTMENT_OTHER): Payer: Medicare Other | Admitting: Lab

## 2012-02-05 VITALS — BP 174/71 | HR 87 | Temp 98.4°F | Resp 20 | Ht 65.0 in | Wt 98.1 lb

## 2012-02-05 DIAGNOSIS — D4620 Refractory anemia with excess of blasts, unspecified: Secondary | ICD-10-CM

## 2012-02-05 DIAGNOSIS — D72829 Elevated white blood cell count, unspecified: Secondary | ICD-10-CM

## 2012-02-05 DIAGNOSIS — D649 Anemia, unspecified: Secondary | ICD-10-CM

## 2012-02-05 DIAGNOSIS — D469 Myelodysplastic syndrome, unspecified: Secondary | ICD-10-CM

## 2012-02-05 LAB — CBC WITH DIFFERENTIAL/PLATELET
Basophils Absolute: 0.1 10*3/uL (ref 0.0–0.1)
Eosinophils Absolute: 0.1 10*3/uL (ref 0.0–0.5)
HCT: 36.9 % (ref 34.8–46.6)
HGB: 12.7 g/dL (ref 11.6–15.9)
LYMPH%: 20.5 % (ref 14.0–49.7)
MCV: 91.3 fL (ref 79.5–101.0)
MONO#: 0 10*3/uL — ABNORMAL LOW (ref 0.1–0.9)
MONO%: 0.6 % (ref 0.0–14.0)
NEUT#: 6.7 10*3/uL — ABNORMAL HIGH (ref 1.5–6.5)
NEUT%: 77.5 % — ABNORMAL HIGH (ref 38.4–76.8)
Platelets: 120 10*3/uL — ABNORMAL LOW (ref 145–400)
RBC: 4.04 10*6/uL (ref 3.70–5.45)
WBC: 8.6 10*3/uL (ref 3.9–10.3)

## 2012-02-05 LAB — COMPREHENSIVE METABOLIC PANEL (CC13)
ALT: 28 U/L (ref 0–55)
CO2: 26 mEq/L (ref 22–29)
Calcium: 9.5 mg/dL (ref 8.4–10.4)
Chloride: 101 mEq/L (ref 98–107)
Creatinine: 0.8 mg/dL (ref 0.6–1.1)
Glucose: 146 mg/dl — ABNORMAL HIGH (ref 70–99)
Total Bilirubin: 0.9 mg/dL (ref 0.20–1.20)

## 2012-02-05 LAB — TECHNOLOGIST REVIEW: Technologist Review: 2

## 2012-02-05 NOTE — Patient Instructions (Addendum)
We will see you back in 1 week.

## 2012-02-05 NOTE — Progress Notes (Signed)
OFFICE PROGRESS NOTE  CC  Meredith Covey, MD [address] [place] [address]  DIAGNOSIS: 78 year old female with severe and profound anemia likely due to a primary bone marrow problem. Differential does include myelodysplastic syndrome and anemia of chronic disease or  PRIOR THERAPY:  #1 patient is status post packed red cell transfusion during recent hospitalization.  #2 patient has been on Procrit every 2 weeks unfortunately this has not effective and we will discontinue this.  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  CURRENT THERAPY: Supportive care tentatively with blood products INTERVAL HISTORY: Meredith Rodriguez 78 y.o. female returns for Followup visit today. Patient is feeling a little bit stronger since receiving blood transfusion last week her hemoglobin looks much better. She has dropped her platelets 220,000 white count is also normal but she still does continue to have 3% blasts. We discussed implications of this again. She does understand that she is transforming to acute leukemia. However I have not done a bone marrow yet and this may be the next step to do depending on whether or not they want aggressive therapy. We discussed chemotherapy again. Clinically patient denies any fevers chills night sweats no bleeding no shortness of breath or chest pain she is fatigued.  MEDICAL HISTORY: Past Medical History  Diagnosis Date  . Hypertension   . Diabetes mellitus   . Hyperkalemia   . IBS (irritable bowel syndrome)   . Hypercholesterolemia   . Cervical radiculopathy   . Anemia     ALLERGIES:  is allergic to amoxicillin; cephalexin; metformin and related; phenergan; and procardia.  MEDICATIONS:  Current Outpatient Prescriptions  Medication Sig Dispense Refill  . amLODipine (NORVASC) 5 MG tablet       . Ascorbic Acid (VITAMIN C) 500 MG tablet Take 500 mg by mouth daily.         Marland Kitchen aspirin 81 MG tablet Take 81 mg by mouth daily.        . cetirizine (ZYRTEC) 10 MG chewable tablet Chew 1 tablet (10 mg total) by mouth daily.  30 tablet  1  . conjugated estrogens (PREMARIN) vaginal cream Place 0.5 g vaginally once a week. Sunday.      Marland Kitchen glucose blood test strip Twice daily as instructed  100 each  3  . Lancets (ACCU-CHEK SOFT TOUCH) lancets Use as instructed twice daily  100 each  12  . metoprolol (LOPRESSOR) 50 MG tablet Take 50 mg by mouth 2 (two) times daily.      . Omega-3 Fatty Acids (FISH OIL) 1000 MG CAPS Take 1,000 mg by mouth daily.       . simvastatin (ZOCOR) 5 MG tablet TAKE 1 TABLET BY MOUTH AT BEDTIME  90 tablet  3  . DISCONTD: glimepiride (AMARYL) 2 MG tablet Take 1 tablet (2 mg total) by mouth every morning.  30 tablet  11   Current Facility-Administered Medications  Medication Dose Route Frequency Provider Last Rate Last Dose  . TDaP (BOOSTRIX) injection 0.5 mL  0.5 mL Intramuscular Once Meredith Covey, MD        SURGICAL HISTORY:  Past Surgical History  Procedure Date  . Varicose veins     surgey 1959  . Tonsilectomy, adenoidectomy, bilateral myringotomy and tubes   . Esophagogastroduodenoscopy 04/01/2011    Procedure: ESOPHAGOGASTRODUODENOSCOPY (EGD);  Surgeon: Barrie Folk, MD;  Location: [facility] ENDOSCOPY;  Service: Endoscopy;  Laterality: N/A;    REVIEW OF  SYSTEMS:  Pertinent items are noted in HPI.   PHYSICAL EXAMINATION: No exam was performed today  ECOG PERFORMANCE STATUS: 2 - Symptomatic, <50% confined to bed  Blood pressure 174/71, pulse 87, temperature 98.4 F (36.9 C), resp. rate 20, height 5\' 5"  (1.651 m), weight 98 lb 1.6 oz (44.498 kg).  LABORATORY DATA: Lab Results  Component Value Date   WBC 8.6 02/05/2012   HGB 12.7 02/05/2012   HCT 36.9 02/05/2012   MCV 91.3 02/05/2012   PLT 120 Large & giant platelets* 02/05/2012      Chemistry      Component Value Date/Time   NA 136 01/29/2012 1424   NA 138 12/18/2011 1018   K 4.5  01/29/2012 1424   K 4.7 12/18/2011 1018   CL 100 01/29/2012 1424   CL 103 12/18/2011 1018   CO2 24 01/29/2012 1424   CO2 30 12/18/2011 1018   BUN 25.0 01/29/2012 1424   BUN 19 12/18/2011 1018   CREATININE 0.9 01/29/2012 1424   CREATININE 0.70 12/18/2011 1018   CREATININE 0.69 07/18/2010 1202      Component Value Date/Time   CALCIUM 9.5 01/29/2012 1424   CALCIUM 9.5 12/18/2011 1018   ALKPHOS 48 01/29/2012 1424   ALKPHOS 39 12/18/2011 1018   AST 26 01/29/2012 1424   AST 22 12/18/2011 1018   ALT 26 01/29/2012 1424   ALT 26 12/18/2011 1018   BILITOT 1.10 01/29/2012 1424   BILITOT 0.8 12/18/2011 1018       RADIOGRAPHIC STUDIES:  No results found.  ASSESSMENT: 78 year old female with:  1.  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  2. 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 This is discussed with the daughter and the patient today recommendations were made as below   #3 patient and I and her daughters had an extensive discussion today about the possible transformation to acute leukemia. We discussed role of chemotherapy and acute leukemia. We also discussed palliative treatment. I do think at this time the family and the patient do not know what they want to do. Today patient's white count was 8.6 platelets were low at 120,000 hemoglobin was stable since she did get a blood transfusion last week. The patient still has several family members coming into town to visit. She wants to discuss with them exactly what they want to do. Whether to pursue chemotherapy or palliative care only.   PLAN:  #1 at this time we will continue monitoring her CBC on him weekly basis and give her supportive transfusions as needed.  #2 we again discussed  decitabine as treatment for acute leukemia in an elderly individual. We discussed risks and benefits. However at this point they're not  ready to make a decision regarding this.  All questions were answered. The patient knows to call the clinic with any problems, questions or concerns. We can certainly see the patient much sooner if necessary.  I spent 25 minutes counseling the patient face to face. The total time spent in the appointment was 30 minutes.    Drue Second, MD Medical/Oncology [facility] [phone] (beeper) [phone] (Office)  02/05/2012, 11:57 AM

## 2012-02-05 NOTE — Telephone Encounter (Signed)
gve the pt her oct 2013 appt calendar °

## 2012-02-12 ENCOUNTER — Other Ambulatory Visit (HOSPITAL_BASED_OUTPATIENT_CLINIC_OR_DEPARTMENT_OTHER): Payer: Medicare Other | Admitting: Lab

## 2012-02-12 ENCOUNTER — Encounter: Payer: Self-pay | Admitting: Adult Health

## 2012-02-12 ENCOUNTER — Ambulatory Visit: Payer: Medicare Other

## 2012-02-12 ENCOUNTER — Telehealth: Payer: Self-pay | Admitting: *Deleted

## 2012-02-12 ENCOUNTER — Telehealth: Payer: Self-pay | Admitting: Oncology

## 2012-02-12 ENCOUNTER — Other Ambulatory Visit: Payer: Medicare Other | Admitting: Lab

## 2012-02-12 ENCOUNTER — Ambulatory Visit (HOSPITAL_BASED_OUTPATIENT_CLINIC_OR_DEPARTMENT_OTHER): Payer: Medicare Other | Admitting: Adult Health

## 2012-02-12 ENCOUNTER — Other Ambulatory Visit: Payer: Self-pay | Admitting: Adult Health

## 2012-02-12 VITALS — BP 186/71 | HR 76 | Temp 98.4°F | Resp 20 | Ht 65.0 in | Wt 98.9 lb

## 2012-02-12 DIAGNOSIS — D4620 Refractory anemia with excess of blasts, unspecified: Secondary | ICD-10-CM

## 2012-02-12 DIAGNOSIS — C9500 Acute leukemia of unspecified cell type not having achieved remission: Secondary | ICD-10-CM

## 2012-02-12 DIAGNOSIS — C9200 Acute myeloblastic leukemia, not having achieved remission: Secondary | ICD-10-CM

## 2012-02-12 DIAGNOSIS — D649 Anemia, unspecified: Secondary | ICD-10-CM

## 2012-02-12 LAB — CBC WITH DIFFERENTIAL/PLATELET
BASO%: 0.2 % (ref 0.0–2.0)
Basophils Absolute: 0 10*3/uL (ref 0.0–0.1)
EOS%: 0.7 % (ref 0.0–7.0)
HCT: 32.9 % — ABNORMAL LOW (ref 34.8–46.6)
HGB: 10.7 g/dL — ABNORMAL LOW (ref 11.6–15.9)
MCH: 29.6 pg (ref 25.1–34.0)
MCHC: 32.5 g/dL (ref 31.5–36.0)
MONO#: 0.5 10*3/uL (ref 0.1–0.9)
NEUT%: 74.9 % (ref 38.4–76.8)
RDW: 15.9 % — ABNORMAL HIGH (ref 11.2–14.5)
WBC: 12.5 10*3/uL — ABNORMAL HIGH (ref 3.9–10.3)
lymph#: 2.5 10*3/uL (ref 0.9–3.3)

## 2012-02-12 LAB — TECHNOLOGIST REVIEW

## 2012-02-12 MED ORDER — ONDANSETRON HCL 8 MG PO TABS: 8.0000 mg | ORAL_TABLET | Freq: Two times a day (BID) | ORAL | Status: DC

## 2012-02-12 MED ORDER — PROCHLORPERAZINE 25 MG RE SUPP: 25.0000 mg | Freq: Two times a day (BID) | RECTAL | Status: DC | PRN

## 2012-02-12 MED ORDER — PROCHLORPERAZINE MALEATE 10 MG PO TABS: 10.0000 mg | ORAL_TABLET | Freq: Four times a day (QID) | ORAL | Status: DC | PRN

## 2012-02-12 MED ORDER — DEXAMETHASONE 4 MG PO TABS: 8.0000 mg | ORAL_TABLET | Freq: Two times a day (BID) | ORAL | Status: DC

## 2012-02-12 MED ORDER — LORAZEPAM 0.5 MG PO TABS: 0.5000 mg | ORAL_TABLET | Freq: Four times a day (QID) | ORAL | Status: DC | PRN

## 2012-02-12 NOTE — Telephone Encounter (Signed)
gve the pt her oct,nov 2013 appt calendar. Pt is aware that she will be called with the bonemarrow biopsy/and port placement appt.

## 2012-02-12 NOTE — Telephone Encounter (Signed)
Per staff phone call and POF I have scheduled appts. JMW  

## 2012-02-12 NOTE — Progress Notes (Signed)
OFFICE PROGRESS NOTE  CC  Meredith Covey, MD [address] [place] [address]  DIAGNOSIS: 78 year old female with severe and profound anemia likely due to a primary bone marrow problem. 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  #2 patient has been on Procrit every 2 weeks unfortunately this has not effective and we will discontinue this.  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  CURRENT THERAPY: Supportive care.  Will start Decitabine in 10 days.    INTERVAL HISTORY: BRAD Rodriguez 78 y.o. female returns for follow up of an ongoing discussion regarding her chemotherapy.  She is accompanied by her daughters and son.  They have a lot of questions regarding Decitabine, side effects, cost, medications needed, and the timing of Decitiabine.  Meredith Rodriguez is feeling well today, without any physical concerns.     MEDICAL HISTORY: Past Medical History  Diagnosis Date  . Hypertension   . Diabetes mellitus   . Hyperkalemia   . IBS (irritable bowel syndrome)   . Hypercholesterolemia   . Cervical radiculopathy   . Anemia     ALLERGIES:  is allergic to amoxicillin; cephalexin; metformin and related; phenergan; and procardia.  MEDICATIONS:  Current Outpatient Prescriptions  Medication Sig Dispense Refill  . amLODipine (NORVASC) 5 MG tablet       . Ascorbic Acid (VITAMIN C) 500 MG tablet Take 500 mg by mouth daily.        Marland Kitchen aspirin 81 MG tablet Take 81 mg by mouth daily.        . cetirizine (ZYRTEC) 10 MG chewable tablet Chew 1 tablet (10 mg total) by mouth daily.  30 tablet  1  . conjugated estrogens (PREMARIN) vaginal cream Place 0.5 g vaginally once a week. Sunday.      Marland Kitchen glucose blood test strip Twice daily as instructed  100 each  3  . Lancets (ACCU-CHEK SOFT TOUCH) lancets Use as instructed twice daily   100 each  12  . metoprolol (LOPRESSOR) 50 MG tablet Take 50 mg by mouth 2 (two) times daily.      . Omega-3 Fatty Acids (FISH OIL) 1000 MG CAPS Take 1,000 mg by mouth daily.       . simvastatin (ZOCOR) 5 MG tablet TAKE 1 TABLET BY MOUTH AT BEDTIME  90 tablet  3  . DISCONTD: glimepiride (AMARYL) 2 MG tablet Take 1 tablet (2 mg total) by mouth every morning.  30 tablet  11   Current Facility-Administered Medications  Medication Dose Route Frequency Provider Last Rate Last Dose  . TDaP (BOOSTRIX) injection 0.5 mL  0.5 mL Intramuscular Once Meredith Covey, MD        SURGICAL HISTORY:  Past Surgical History  Procedure Date  . Varicose veins     surgey 1959  . Tonsilectomy, adenoidectomy, bilateral myringotomy and tubes   . Esophagogastroduodenoscopy 04/01/2011    Procedure: ESOPHAGOGASTRODUODENOSCOPY (EGD);  Surgeon: Barrie Folk, MD;  Location: [facility] ENDOSCOPY;  Service: Endoscopy;  Laterality: N/A;    REVIEW OF SYSTEMS:   General: fatigue (-), night sweats (-), fever (-), pain (-) Lymph: palpable nodes (-) HEENT: vision changes (-), mucositis (-), gum bleeding (-), epistaxis (-) Cardiovascular: chest pain (-), palpitations (-) Pulmonary: shortness of breath (-), dyspnea on exertion (-), cough (-), hemoptysis (-) GI:  Early satiety (-), melena (-), dysphagia (-), nausea/vomiting (-),  diarrhea (-) GU: dysuria (-), hematuria (-), incontinence (-) Musculoskeletal: joint swelling (-), joint pain (-), back pain (-) Neuro: weakness (-), numbness (-), headache (-), confusion (-) Skin: Rash (-), lesions (-), dryness (-) Psych: depression (-), suicidal/homicidal ideation (-), feeling of hopelessness (-)   PHYSICAL EXAMINATION: BP 186/71  Pulse 76  Temp 98.4 F (36.9 C) (Oral)  Resp 20  Ht 5\' 5"  (1.651 m)  Wt 98 lb 14.4 oz (44.861 kg)  BMI 16.46 kg/m2 General: Patient is a well appearing female in no acute distress HEENT: PERRLA, sclerae anicteric no conjunctival pallor, MMM Neck:  supple, no palpable adenopathy Lungs: clear to auscultation bilaterally, no wheezes, rhonchi, or rales Cardiovascular: regular rate rhythm, S1, S2, no murmurs, rubs or gallops Abdomen: Soft, non-tender, non-distended, normoactive bowel sounds, no HSM Extremities: warm and well perfused, no clubbing, cyanosis, or edema Skin: No rashes or lesions Neuro: Non-focal ECOG PERFORMANCE STATUS: 2 - Symptomatic, <50% confined to bed  LABORATORY DATA: Lab Results  Component Value Date   WBC 12.5* 02/12/2012   HGB 10.7* 02/12/2012   HCT 32.9* 02/12/2012   MCV 91.1 02/12/2012   PLT 161 02/12/2012      Chemistry      Component Value Date/Time   NA 137 02/05/2012 1120   NA 138 12/18/2011 1018   K 4.4 02/05/2012 1120   K 4.7 12/18/2011 1018   CL 101 02/05/2012 1120   CL 103 12/18/2011 1018   CO2 26 02/05/2012 1120   CO2 30 12/18/2011 1018   BUN 20.0 02/05/2012 1120   BUN 19 12/18/2011 1018   CREATININE 0.8 02/05/2012 1120   CREATININE 0.70 12/18/2011 1018   CREATININE 0.69 07/18/2010 1202      Component Value Date/Time   CALCIUM 9.5 02/05/2012 1120   CALCIUM 9.5 12/18/2011 1018   ALKPHOS 51 02/05/2012 1120   ALKPHOS 39 12/18/2011 1018   AST 26 02/05/2012 1120   AST 22 12/18/2011 1018   ALT 28 02/05/2012 1120   ALT 26 12/18/2011 1018   BILITOT 0.90 02/05/2012 1120   BILITOT 0.8 12/18/2011 1018       RADIOGRAPHIC STUDIES:  No results found.  ASSESSMENT: 78 year old female with:  1.  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  2. 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 This is discussed with the daughter and the patient today recommendations were made as below   #3 patient and I and her daughters had an extensive discussion today about the likely transformation to acute leukemia. We discussed the process of chemotherapy, risks, benefits, and potential adverse effects  of the drug.  Through much conversation between myself, the patient, and her children, the patient has decided to proceed with chemotherapy.   PLAN:  #1 A bone marrow biopsy and port placement have been ordered.  She has tentatively been scheduled for Decitabine on 10/28.    #2 I will see her back on 10/28 before chemotherapy.  She was given information regarding Decitabine and its adverse effects.  She will have the procedures scheduled in the interim.    All questions were answered. The patient knows to call the clinic with any problems, questions or concerns. We can certainly see the patient much sooner if necessary.  I spent 40 minutes counseling the patient face to face. The total time spent in the appointment was 30 minutes.    Cherie Ouch Lyn Hollingshead, NP Medical Oncology [facility] Phone: [phone]  02/12/2012, 3:01 PM

## 2012-02-12 NOTE — Patient Instructions (Addendum)
We will order and schedule your bone marrow biopsy and port placement.  We will see you back on 10/28 for labs and the start of the Decitabine.    Decitabine injection for infusion What is this medicine? DECITABINE (dee SYE ta been) is a chemotherapy drug. This medicine reduces the growth of cancer cells. It is used to treat adults with myelodysplastic syndromes. This medicine may be used for other purposes; ask your health care provider or pharmacist if you have questions. What should I tell my health care provider before I take this medicine? They need to know if you have any of these conditions: -infection (especially a virus infection such as chickenpox, cold sores, or herpes) -kidney disease -liver disease -an unusual or allergic reaction to decitabine, other medicines, foods, dyes, or preservatives -pregnant or trying to get pregnant -breast-feeding How should I use this medicine? This medicine is for infusion into a vein. It is administered in a hospital or clinic by a doctor or health care professional. Talk to your pediatrician regarding the use of this medicine in children. Special care may be needed. Overdosage: If you think you have taken too much of this medicine contact a poison control center or emergency room at once. NOTE: This medicine is only for you. Do not share this medicine with others. What if I miss a dose? It is important not to miss your dose. Call your doctor or health care professional if you are unable to keep an appointment. What may interact with this medicine? -vaccines Talk to your doctor or health care professional before taking any of these medicines: -aspirin -acetaminophen -ibuprofen -ketoprofen -naproxen This list may not describe all possible interactions. Give your health care provider a list of all the medicines, herbs, non-prescription drugs, or dietary supplements you use. Also tell them if you smoke, drink alcohol, or use illegal drugs. Some  items may interact with your medicine. What should I watch for while using this medicine? Visit your doctor for checks on your progress. This drug may make you feel generally unwell. This is not uncommon, as chemotherapy can affect healthy cells as well as cancer cells. Report any side effects. Continue your course of treatment even though you feel ill unless your doctor tells you to stop. In some cases, you may be given additional medicines to help with side effects. Follow all directions for their use. Call your doctor or health care professional for advice if you get a fever, chills or sore throat, or other symptoms of a cold or flu. Do not treat yourself. This drug decreases your body's ability to fight infections. Try to avoid being around people who are sick. This medicine may increase your risk to bruise or bleed. Call your doctor or health care professional if you notice any unusual bleeding. Be careful brushing and flossing your teeth or using a toothpick because you may get an infection or bleed more easily. If you have any dental work done, tell your dentist you are receiving this medicine. Avoid taking products that contain aspirin, acetaminophen, ibuprofen, naproxen, or ketoprofen unless instructed by your doctor. These medicines may hide a fever. Do not have any vaccinations without your doctor's approval and avoid anyone who has recently had oral polio vaccine. Do not become pregnant while taking this medicine. Women should inform their doctor if they wish to become pregnant or think they might be pregnant. There is a potential for serious side effects to an unborn child. Talk to your health care professional  or pharmacist for more information. Do not breast-feed an infant while taking this medicine. Males who take this medicine should use a condom to avoid pregnancy in their partner. Do not father a child during treatment, and for 2 months after treatment is finished. What side effects may  I notice from receiving this medicine? Side effects that you should report to your doctor or health care professional as soon as possible: -low blood counts - this medicine may decrease the number of white blood cells, red blood cells and platelets. You may be at increased risk for infections and bleeding. -signs of infection - fever or chills, cough, sore throat, pain or difficulty passing urine -signs of decreased platelets or bleeding - bruising, pinpoint red spots on the skin, black, tarry stools, blood in the urine -signs of decreased red blood cells - unusual weakness or tiredness, fainting spells, lightheadedness -increased blood sugar Side effects that usually do not require medical attention (report to your prescriber or health care professional if they continue or are bothersome): -constipation -diarrhea -headache -loss of appetite -nausea, vomiting -skin rash, itching -stomach pain -water retention -weak or tired This list may not describe all possible side effects. Call your doctor for medical advice about side effects. You may report side effects to FDA at 1-800-FDA-1088. Where should I keep my medicine? This drug is given in a hospital or clinic and will not be stored at home. NOTE: This sheet is a summary. It may not cover all possible information. If you have questions about this medicine, talk to your doctor, pharmacist, or health care provider.  2012, [employer]. (08/17/2007 4:33:42 PM)

## 2012-02-13 ENCOUNTER — Encounter (HOSPITAL_COMMUNITY): Payer: Self-pay | Admitting: Pharmacy Technician

## 2012-02-13 ENCOUNTER — Other Ambulatory Visit: Payer: Self-pay | Admitting: Radiology

## 2012-02-14 ENCOUNTER — Encounter (HOSPITAL_COMMUNITY): Payer: Self-pay | Admitting: Pharmacy Technician

## 2012-02-14 ENCOUNTER — Other Ambulatory Visit: Payer: Self-pay | Admitting: Radiology

## 2012-02-17 ENCOUNTER — Ambulatory Visit (HOSPITAL_COMMUNITY)
Admission: RE | Admit: 2012-02-17 | Discharge: 2012-02-17 | Disposition: A | Discharge status: Home / Self Care | Payer: Medicare Other | Source: Ambulatory Visit | Attending: Oncology | Admitting: Oncology

## 2012-02-17 ENCOUNTER — Encounter (HOSPITAL_COMMUNITY): Payer: Self-pay

## 2012-02-17 ENCOUNTER — Other Ambulatory Visit: Payer: Self-pay | Admitting: Oncology

## 2012-02-17 DIAGNOSIS — I10 Essential (primary) hypertension: Secondary | ICD-10-CM | POA: Insufficient documentation

## 2012-02-17 DIAGNOSIS — E119 Type 2 diabetes mellitus without complications: Secondary | ICD-10-CM | POA: Insufficient documentation

## 2012-02-17 DIAGNOSIS — C9590 Leukemia, unspecified not having achieved remission: Secondary | ICD-10-CM | POA: Insufficient documentation

## 2012-02-17 DIAGNOSIS — E7800 Pure hypercholesterolemia, unspecified: Secondary | ICD-10-CM | POA: Insufficient documentation

## 2012-02-17 DIAGNOSIS — C9200 Acute myeloblastic leukemia, not having achieved remission: Secondary | ICD-10-CM

## 2012-02-17 DIAGNOSIS — Z79899 Other long term (current) drug therapy: Secondary | ICD-10-CM | POA: Insufficient documentation

## 2012-02-17 DIAGNOSIS — Z7982 Long term (current) use of aspirin: Secondary | ICD-10-CM | POA: Insufficient documentation

## 2012-02-17 LAB — PROTIME-INR
INR: 0.98 (ref 0.00–1.49)
Prothrombin Time: 12.9 seconds (ref 11.6–15.2)

## 2012-02-17 LAB — CBC
Hemoglobin: 11.1 g/dL — ABNORMAL LOW (ref 12.0–15.0)
MCH: 30 pg (ref 26.0–34.0)
RBC: 3.7 MIL/uL — ABNORMAL LOW (ref 3.87–5.11)

## 2012-02-17 LAB — APTT: aPTT: 36 seconds (ref 24–37)

## 2012-02-17 MED ORDER — MIDAZOLAM HCL 2 MG/2ML IJ SOLN
INTRAMUSCULAR | Status: AC
  Filled 2012-02-17: qty 6

## 2012-02-17 MED ORDER — LIDOCAINE HCL 1 % IJ SOLN
INTRAMUSCULAR | Status: AC
  Filled 2012-02-17: qty 20

## 2012-02-17 MED ORDER — SODIUM CHLORIDE 0.9 % IV SOLN
INTRAVENOUS | Status: DC
  Administered 2012-02-17: 09:00:00 via INTRAVENOUS

## 2012-02-17 MED ORDER — HEPARIN SOD (PORK) LOCK FLUSH 100 UNIT/ML IV SOLN
INTRAVENOUS | Status: AC | PRN
  Administered 2012-02-17: 500 [IU]

## 2012-02-17 MED ORDER — MIDAZOLAM HCL 2 MG/2ML IJ SOLN
INTRAMUSCULAR | Status: AC | PRN
  Administered 2012-02-17: 0.5 mg via INTRAVENOUS
  Administered 2012-02-17: 1 mg via INTRAVENOUS
  Administered 2012-02-17: 0.5 mg via INTRAVENOUS

## 2012-02-17 MED ORDER — VANCOMYCIN HCL IN DEXTROSE 1-5 GM/200ML-% IV SOLN
1000.0000 mg | INTRAVENOUS | Status: AC
  Administered 2012-02-17: 1000 mg via INTRAVENOUS

## 2012-02-17 MED ORDER — FENTANYL CITRATE 0.05 MG/ML IJ SOLN
INTRAMUSCULAR | Status: AC | PRN
  Administered 2012-02-17 (×2): 50 ug via INTRAVENOUS

## 2012-02-17 MED ORDER — FENTANYL CITRATE 0.05 MG/ML IJ SOLN
INTRAMUSCULAR | Status: AC
  Filled 2012-02-17: qty 6

## 2012-02-17 NOTE — Procedures (Signed)
Proc:  Right IJ portacath placement Tip at cavoatrial junction.  No PTX.

## 2012-02-17 NOTE — H&P (Signed)
Chief Complaint: "I'm here for portacath" Referring Physician:Khan HPI: Meredith Rodriguez is an 78 y.o. female recently diagnosed with leukemia. She is referred for port placement and is also scheduled for BM biopsy later this week. She otherwise feels well. PMHx reviewed  Past Medical History:  Past Medical History  Diagnosis Date  . Hypertension   . Diabetes mellitus   . Hyperkalemia   . IBS (irritable bowel syndrome)   . Hypercholesterolemia   . Cervical radiculopathy   . Anemia     Past Surgical History:  Past Surgical History  Procedure Date  . Varicose veins     surgey 1959  . Tonsilectomy, adenoidectomy, bilateral myringotomy and tubes   . Esophagogastroduodenoscopy 04/01/2011    Procedure: ESOPHAGOGASTRODUODENOSCOPY (EGD);  Surgeon: Barrie Folk, MD;  Location: [facility] ENDOSCOPY;  Service: Endoscopy;  Laterality: N/A;    Family History:  Family History  Problem Relation Age of Onset  . Stroke Mother   . Hypertension Father   . Cancer Sister   . Cancer Sister   . Diabetes Sister   . Cancer Sister     colon cancer  . Cancer Daughter     breast cancer    Social History:  reports that she has never smoked. She does not have any smokeless tobacco history on file. She reports that she does not drink alcohol or use illicit drugs.  Allergies:  Allergies  Allergen Reactions  . Amoxicillin (Amoxicillin)     Upset stomach  . Cephalexin Nausea Only  . Metformin And Related     Gi problems  . Phenergan (Promethazine Hcl) Other (See Comments)    Unknown   . Procardia (Nifedipine)     dizziness    Medications: amLODipine (NORVASC) 5 MG tablet (Taking) 09/06/2011 Sig - Route: Take 2.5 mg by mouth every morning. - Oral Class: Historical Med Number of times this order has been changed since signing: 2 Order [address] Trail aspirin 81 MG tablet (Taking) Sig - Route: Take 81 mg by mouth at bedtime. - Oral Class: Historical Med Number of times this order has been changed since signing: 3  Order Audit Trail calcium gluconate 500 MG tablet (Taking) Sig - Route: Take 500 mg by mouth daily. - Oral Class: Historical Med Number of times this order has been changed since signing: 1 Order Audit Trail conjugated estrogens (PREMARIN) vaginal cream (Taking) Sig - Route: Place 0.5 g vaginally once a week. Sunday. - Vaginal Class: Historical Med Number of times this order has been changed since signing: 2 Order [address] Trail dexamethasone (DECADRON) 4 MG tablet (Taking) 30 tablet 1 02/12/2012 Sig - Route: Take 2 tablets (8 mg total) by mouth 2 (two) times daily with a meal. Take daily starting the day after chemotherapy for 2 days. Take with food. - Oral Class: Print Number of times this order has been changed since signing: 2 Order Audit Trail metoprolol (LOPRESSOR) 50 MG tablet (Taking) 05/27/2011 Sig - Route: Take 50 mg by mouth 2 (two) times daily. - Oral Class: Historical Med Omega-3 Fatty Acids (FISH OIL) 1000 MG CAPS (Taking) Sig - Route: Take 1,000 mg by mouth daily. - Oral Class: Historical Med Number of times this order has been changed since signing: 3 Order Audit Trail ondansetron (ZOFRAN) 8 MG tablet (Taking) 30 tablet 1 02/12/2012 Sig - Route: Take 1 tablet (8 mg total) by mouth 2 (two) times daily. Take two times a day starting the day after chemo for 2 days. Then take two times  a day as needed for nausea or vomiting. - Oral Class: Print Number of times this order has been changed since signing: 2 Order [address] Trail prochlorperazine (COMPAZINE) 10 MG tablet (Taking) 30 tablet 1 02/12/2012 Sig - Route: Take 1 tablet (10 mg total) by mouth every 6 (six) hours as needed (Nausea or vomiting). - Oral Number of times this order has been changed since signing: 2 Order [address]it Trail prochlorperazine (COMPAZINE) 25 MG suppository (Taking) 12 suppository 3 02/12/2012 Sig - Route: Place 1 suppository (25 mg total) rectally every 12 (twelve) hours as needed for nausea. - Rectal Number of times this order has been  changed since signing: 2 Order [address] Trail LORazepam (ATIVAN) 0.5 MG tablet (Taking/Discontinued) 30 tablet 0 02/12/2012 02/14/2012 Sig - Route: Take 1 tablet (0.5 mg total) by mouth every 6 (six) hours as needed (Nausea or vomiting). - Oral Class: Print Number of times this order has been changed since signing: 2 Order [address] Trail simvastatin (ZOCOR) 5 MG tablet Sig - Route: Take 5 mg by mouth at bedtime. - Oral Class: Historical Med   Please HPI for pertinent positives, otherwise complete 10 system ROS negative.  Physical Exam: Blood pressure 157/46, pulse 64, temperature 97.6 F (36.4 C), temperature source Oral, resp. rate 18, height 5\' 4"  (1.626 m), weight 98 lb (44.453 kg), SpO2 100.00%. Body mass index is 16.82 kg/(m^2).   General Appearance:  Alert, thin, cooperative, no distress, appears stated age  Head:  Normocephalic, without obvious abnormality, atraumatic  ENT: Unremarkable  Neck: Supple, symmetrical, trachea midline, no adenopathy, thyroid: not enlarged, symmetric, no tenderness/mass/nodules  Lungs:   Clear to auscultation bilaterally, no w/r/r, respirations unlabored without use of accessory muscles.  Chest Wall:  No tenderness or deformity  Heart:  Regular rate and rhythm, S1, S2 normal, no murmur, rub or gallop. Carotids 2+ without bruit.  Extremities: Extremities normal, atraumatic, no cyanosis or edema  Neurologic: Normal affect, no gross deficits.   Results for orders placed during the hospital encounter of 02/17/12 (from the past 48 hour(s))  GLUCOSE, CAPILLARY     Status: Abnormal   Collection Time   02/17/12  8:26 AM      Component Value Range Comment   Glucose-Capillary 193 (*) 70 - 99 mg/dL    No results found.  Assessment/Plan Acute leukemia For portacath placement today Discussed procedure and risks associated. Labs pending Consent signed in chart  Brayton El PA-C 02/17/2012, 8:44 AM

## 2012-02-17 NOTE — H&P (Signed)
Agree 

## 2012-02-19 ENCOUNTER — Other Ambulatory Visit: Payer: Self-pay | Admitting: *Deleted

## 2012-02-19 ENCOUNTER — Encounter (HOSPITAL_COMMUNITY): Payer: Self-pay

## 2012-02-19 ENCOUNTER — Ambulatory Visit (HOSPITAL_COMMUNITY)
Admission: RE | Admit: 2012-02-19 | Discharge: 2012-02-19 | Disposition: A | Discharge status: Home / Self Care | Payer: Medicare Other | Source: Ambulatory Visit | Attending: Oncology | Admitting: Oncology

## 2012-02-19 DIAGNOSIS — I10 Essential (primary) hypertension: Secondary | ICD-10-CM | POA: Insufficient documentation

## 2012-02-19 DIAGNOSIS — C9200 Acute myeloblastic leukemia, not having achieved remission: Secondary | ICD-10-CM | POA: Insufficient documentation

## 2012-02-19 DIAGNOSIS — E119 Type 2 diabetes mellitus without complications: Secondary | ICD-10-CM | POA: Insufficient documentation

## 2012-02-19 LAB — BONE MARROW EXAM: Bone Marrow Exam: 748

## 2012-02-19 LAB — GLUCOSE, CAPILLARY: Glucose-Capillary: 246 mg/dL — ABNORMAL HIGH (ref 70–99)

## 2012-02-19 LAB — CBC
Platelets: 156 10*3/uL (ref 150–400)
RBC: 3.2 MIL/uL — ABNORMAL LOW (ref 3.87–5.11)
WBC: 14.3 10*3/uL — ABNORMAL HIGH (ref 4.0–10.5)

## 2012-02-19 MED ORDER — LIDOCAINE-PRILOCAINE 2.5-2.5 % EX CREA: TOPICAL_CREAM | CUTANEOUS | Status: DC | PRN

## 2012-02-19 MED ORDER — MIDAZOLAM HCL 2 MG/2ML IJ SOLN
INTRAMUSCULAR | Status: AC
  Filled 2012-02-19: qty 4

## 2012-02-19 MED ORDER — HEPARIN SOD (PORK) LOCK FLUSH 100 UNIT/ML IV SOLN
500.0000 [IU] | INTRAVENOUS | Status: AC | PRN
  Administered 2012-02-19: 500 [IU]
  Filled 2012-02-19: qty 5

## 2012-02-19 MED ORDER — FENTANYL CITRATE 0.05 MG/ML IJ SOLN
INTRAMUSCULAR | Status: AC | PRN
  Administered 2012-02-19: 25 ug via INTRAVENOUS

## 2012-02-19 MED ORDER — SODIUM CHLORIDE 0.9 % IV SOLN: INTRAVENOUS | Status: DC

## 2012-02-19 MED ORDER — FENTANYL CITRATE 0.05 MG/ML IJ SOLN
INTRAMUSCULAR | Status: AC
  Filled 2012-02-19: qty 4

## 2012-02-19 MED ORDER — MIDAZOLAM HCL 5 MG/5ML IJ SOLN
INTRAMUSCULAR | Status: AC | PRN
  Administered 2012-02-19: 0.5 mg via INTRAVENOUS

## 2012-02-19 NOTE — Procedures (Signed)
BM aspirate and core R iliac bone No comp 

## 2012-02-19 NOTE — Progress Notes (Signed)
Patient ambulated in hallway and to restroom without any problems. Patient denies SOB, pain and lightheadedness.

## 2012-02-19 NOTE — H&P (Signed)
Chief Complaint: "I'm here for a bone marrow biopsy" Referring Physician:Khan HPI: Meredith Rodriguez is an 78 y.o. female recently diagnosed with leukemia. She is referred for  BM biopsy. She was just here 2 days for a port placement, which she did fine with. No new c/o She otherwise feels well. PMHx reviewed  Past Medical History:  Past Medical History  Diagnosis Date  . Hypertension   . Diabetes mellitus   . Hyperkalemia   . IBS (irritable bowel syndrome)   . Hypercholesterolemia   . Cervical radiculopathy   . Anemia     Past Surgical History:  Past Surgical History  Procedure Date  . Varicose veins     surgey 1959  . Tonsilectomy, adenoidectomy, bilateral myringotomy and tubes   . Esophagogastroduodenoscopy 04/01/2011    Procedure: ESOPHAGOGASTRODUODENOSCOPY (EGD);  Surgeon: Barrie Folk, MD;  Location: [facility] ENDOSCOPY;  Service: Endoscopy;  Laterality: N/A;    Family History:  Family History  Problem Relation Age of Onset  . Stroke Mother   . Hypertension Father   . Cancer Sister   . Cancer Sister   . Diabetes Sister   . Cancer Sister     colon cancer  . Cancer Daughter     breast cancer    Social History:  reports that she has never smoked. She does not have any smokeless tobacco history on file. She reports that she does not drink alcohol or use illicit drugs.  Allergies:  Allergies  Allergen Reactions  . Amoxicillin (Amoxicillin)     Upset stomach  . Cephalexin Nausea Only  . Metformin And Related     Gi problems  . Phenergan (Promethazine Hcl) Other (See Comments)    Unknown   . Procardia (Nifedipine)     dizziness    Medications: amLODipine (NORVASC) 5 MG tablet (Taking) 09/06/2011 Sig - Route: Take 2.5 mg by mouth every morning. - Oral Class: Historical Med Number of times this order has been changed since signing: 2 Order [address] Trail aspirin 81 MG tablet (Taking) Sig - Route: Take 81 mg by mouth at bedtime. - Oral Class: Historical Med Number of times this  order has been changed since signing: 3 Order [address]it Trail calcium gluconate 500 MG tablet (Taking) Sig - Route: Take 500 mg by mouth daily. - Oral Class: Historical Med Number of times this order has been changed since signing: 1 Order Audit Trail conjugated estrogens (PREMARIN) vaginal cream (Taking) Sig - Route: Place 0.5 g vaginally once a week. Sunday. - Vaginal Class: Historical Med Number of times this order has been changed since signing: 2 Order [address] Trail dexamethasone (DECADRON) 4 MG tablet (Taking) 30 tablet 1 02/12/2012 Sig - Route: Take 2 tablets (8 mg total) by mouth 2 (two) times daily with a meal. Take daily starting the day after chemotherapy for 2 days. Take with food. - Oral Class: Print Number of times this order has been changed since signing: 2 Order [address] Trail metoprolol (LOPRESSOR) 50 MG tablet (Taking) 05/27/2011 Sig - Route: Take 50 mg by mouth 2 (two) times daily. - Oral Class: Historical Med Omega-3 Fatty Acids (FISH OIL) 1000 MG CAPS (Taking) Sig - Route: Take 1,000 mg by mouth daily. - Oral Class: Historical Med Number of times this order has been changed since signing: 3 Order [address] Trail ondansetron (ZOFRAN) 8 MG tablet (Taking) 30 tablet 1 02/12/2012 Sig - Route: Take 1 tablet (8 mg total) by mouth 2 (two) times daily. Take two times a day  starting the day after chemo for 2 days. Then take two times a day as needed for nausea or vomiting. - Oral Class: Print Number of times this order has been changed since signing: 2 Order [address] Trail prochlorperazine (COMPAZINE) 10 MG tablet (Taking) 30 tablet 1 02/12/2012 Sig - Route: Take 1 tablet (10 mg total) by mouth every 6 (six) hours as needed (Nausea or vomiting). - Oral Number of times this order has been changed since signing: 2 Order [address] Trail prochlorperazine (COMPAZINE) 25 MG suppository (Taking) 12 suppository 3 02/12/2012 Sig - Route: Place 1 suppository (25 mg total) rectally every 12 (twelve) hours as needed for nausea. -  Rectal Number of times this order has been changed since signing: 2 Order [address] Trail LORazepam (ATIVAN) 0.5 MG tablet (Taking/Discontinued) 30 tablet 0 02/12/2012 02/14/2012 Sig - Route: Take 1 tablet (0.5 mg total) by mouth every 6 (six) hours as needed (Nausea or vomiting). - Oral Class: Print Number of times this order has been changed since signing: 2 Order [address] Trail simvastatin (ZOCOR) 5 MG tablet Sig - Route: Take 5 mg by mouth at bedtime. - Oral Class: Historical Med   Please HPI for pertinent positives, otherwise complete 10 system ROS negative.  Physical Exam: Blood pressure 152/50, pulse 73, resp. rate 14, SpO2 99.00%. There is no height or weight on file to calculate BMI.   General Appearance:  Alert, thin, cooperative, no distress, appears stated age  Head:  Normocephalic, without obvious abnormality, atraumatic  ENT: Unremarkable  Neck: Supple, symmetrical, trachea midline, no adenopathy, thyroid: not enlarged, symmetric, no tenderness/mass/nodules  Lungs:   Clear to auscultation bilaterally, no w/r/r, respirations unlabored without use of accessory muscles.  Chest Wall:  New port Rt upper chest, dressing intact, clean.  Heart:  Regular rate and rhythm, S1, S2 normal, no murmur, rub or gallop. Carotids 2+ without bruit.  Extremities: Extremities normal, atraumatic, no cyanosis or edema  Neurologic: Normal affect, no gross deficits.   Results for orders placed during the hospital encounter of 02/19/12 (from the past 48 hour(s))  CBC     Status: Abnormal (Preliminary result)   Collection Time   02/19/12  9:37 AM      Component Value Range Comment   WBC 14.3 (*) 4.0 - 10.5 K/uL    RBC 3.20 (*) 3.87 - 5.11 MIL/uL    Hemoglobin 9.5 (*) 12.0 - 15.0 g/dL    HCT 91.4 (*) 78.2 - 46.0 %    MCV 89.7  78.0 - 100.0 fL    MCH 29.7  26.0 - 34.0 pg    MCHC 33.1  30.0 - 36.0 g/dL    RDW 95.6 (*) 21.3 - 15.5 %    Platelets PENDING  150 - 400 K/uL    No results  found.  Assessment/Plan Acute leukemia For CT guided BM biopsy Discussed procedure and risks associated. Labs pending Consent signed in chart  Brayton El PA-C 02/19/2012, 10:40 AM

## 2012-02-24 ENCOUNTER — Ambulatory Visit: Payer: Medicare Other | Admitting: Lab

## 2012-02-24 ENCOUNTER — Other Ambulatory Visit: Payer: Self-pay | Admitting: *Deleted

## 2012-02-24 ENCOUNTER — Encounter: Payer: Self-pay | Admitting: Adult Health

## 2012-02-24 ENCOUNTER — Other Ambulatory Visit (HOSPITAL_BASED_OUTPATIENT_CLINIC_OR_DEPARTMENT_OTHER): Payer: Medicare Other | Admitting: Lab

## 2012-02-24 ENCOUNTER — Telehealth: Payer: Self-pay | Admitting: Oncology

## 2012-02-24 ENCOUNTER — Ambulatory Visit (HOSPITAL_BASED_OUTPATIENT_CLINIC_OR_DEPARTMENT_OTHER): Payer: Medicare Other

## 2012-02-24 ENCOUNTER — Ambulatory Visit (HOSPITAL_BASED_OUTPATIENT_CLINIC_OR_DEPARTMENT_OTHER): Payer: Medicare Other | Admitting: Adult Health

## 2012-02-24 ENCOUNTER — Telehealth: Payer: Self-pay | Admitting: *Deleted

## 2012-02-24 VITALS — BP 152/57 | HR 85 | Temp 98.6°F | Resp 20 | Ht 64.0 in | Wt 99.5 lb

## 2012-02-24 DIAGNOSIS — D649 Anemia, unspecified: Secondary | ICD-10-CM

## 2012-02-24 DIAGNOSIS — D4620 Refractory anemia with excess of blasts, unspecified: Secondary | ICD-10-CM

## 2012-02-24 DIAGNOSIS — Z5111 Encounter for antineoplastic chemotherapy: Secondary | ICD-10-CM

## 2012-02-24 DIAGNOSIS — C9500 Acute leukemia of unspecified cell type not having achieved remission: Secondary | ICD-10-CM

## 2012-02-24 DIAGNOSIS — C9200 Acute myeloblastic leukemia, not having achieved remission: Secondary | ICD-10-CM

## 2012-02-24 LAB — CBC WITH DIFFERENTIAL/PLATELET
BASO%: 0.2 % (ref 0.0–2.0)
Eosinophils Absolute: 0.1 10*3/uL (ref 0.0–0.5)
LYMPH%: 13.2 % — ABNORMAL LOW (ref 14.0–49.7)
MCHC: 32.3 g/dL (ref 31.5–36.0)
MONO#: 0.7 10*3/uL (ref 0.1–0.9)
NEUT#: 13.1 10*3/uL — ABNORMAL HIGH (ref 1.5–6.5)
RBC: 2.77 10*6/uL — ABNORMAL LOW (ref 3.70–5.45)
RDW: 16.1 % — ABNORMAL HIGH (ref 11.2–14.5)
WBC: 16 10*3/uL — ABNORMAL HIGH (ref 3.9–10.3)
lymph#: 2.1 10*3/uL (ref 0.9–3.3)
nRBC: 0 % (ref 0–0)

## 2012-02-24 LAB — COMPREHENSIVE METABOLIC PANEL (CC13)
ALT: 34 U/L (ref 0–55)
AST: 24 U/L (ref 5–34)
Calcium: 9.2 mg/dL (ref 8.4–10.4)
Chloride: 102 mEq/L (ref 98–107)
Creatinine: 0.8 mg/dL (ref 0.6–1.1)
Potassium: 4.6 mEq/L (ref 3.5–5.1)
Sodium: 135 mEq/L — ABNORMAL LOW (ref 136–145)
Total Protein: 6.2 g/dL — ABNORMAL LOW (ref 6.4–8.3)

## 2012-02-24 MED ORDER — SODIUM CHLORIDE 0.9 % IV SOLN
20.0000 mg/m2 | Freq: Once | INTRAVENOUS | Status: AC
  Administered 2012-02-24: 30 mg via INTRAVENOUS
  Filled 2012-02-24: qty 6

## 2012-02-24 MED ORDER — DEXAMETHASONE SODIUM PHOSPHATE 10 MG/ML IJ SOLN
10.0000 mg | Freq: Once | INTRAMUSCULAR | Status: AC
  Administered 2012-02-24: 10 mg via INTRAVENOUS

## 2012-02-24 MED ORDER — SODIUM CHLORIDE 0.9 % IJ SOLN
10.0000 mL | INTRAMUSCULAR | Status: DC | PRN
  Filled 2012-02-24: qty 10

## 2012-02-24 MED ORDER — ONDANSETRON 8 MG/50ML IVPB (CHCC)
8.0000 mg | Freq: Once | INTRAVENOUS | Status: AC
  Administered 2012-02-24: 8 mg via INTRAVENOUS

## 2012-02-24 MED ORDER — DIPHENHYDRAMINE HCL 25 MG PO CAPS
25.0000 mg | ORAL_CAPSULE | Freq: Once | ORAL | Status: AC
  Administered 2012-02-24: 25 mg via ORAL

## 2012-02-24 MED ORDER — HEPARIN SOD (PORK) LOCK FLUSH 100 UNIT/ML IV SOLN
500.0000 [IU] | Freq: Once | INTRAVENOUS | Status: AC | PRN
  Administered 2012-02-24: 500 [IU]
  Filled 2012-02-24: qty 5

## 2012-02-24 MED ORDER — SODIUM CHLORIDE 0.9 % IV SOLN: 250.0000 mL | Freq: Once | INTRAVENOUS | Status: AC

## 2012-02-24 MED ORDER — HEPARIN SOD (PORK) LOCK FLUSH 100 UNIT/ML IV SOLN
500.0000 [IU] | Freq: Every day | INTRAVENOUS | Status: AC | PRN
  Filled 2012-02-24: qty 5

## 2012-02-24 MED ORDER — SODIUM CHLORIDE 0.9 % IV SOLN
Freq: Once | INTRAVENOUS | Status: AC
  Administered 2012-02-24: 12:00:00 via INTRAVENOUS

## 2012-02-24 MED ORDER — SODIUM CHLORIDE 0.9 % IJ SOLN
10.0000 mL | INTRAMUSCULAR | Status: AC | PRN
  Administered 2012-02-24: 10 mL
  Filled 2012-02-24: qty 10

## 2012-02-24 MED ORDER — ACETAMINOPHEN 325 MG PO TABS
650.0000 mg | ORAL_TABLET | Freq: Once | ORAL | Status: AC
  Administered 2012-02-24: 650 mg via ORAL

## 2012-02-24 NOTE — Progress Notes (Signed)
OK to treat with Hgb 8.1 with plan to administer 2 units of blood. [facility] blood bank is having difficulty crossmatching due to antibodies. Spoke with pt and Augustin Schooling PA and will administer blood tomorrow.

## 2012-02-24 NOTE — Telephone Encounter (Signed)
Per staff message and POF I have scheduled appts.  JMW  

## 2012-02-24 NOTE — Patient Instructions (Signed)
Doing well.  Proceed with Decitabine.  I will come by and discuss your bone marrow results with you once I receive them later this week.  Please call with any questions or concerns.

## 2012-02-24 NOTE — Telephone Encounter (Signed)
gve the pt's son the nov and dec 2013 appt calendar. They are aware that her tx's will be added to the schedule. Sent michelle a staff message.

## 2012-02-24 NOTE — Progress Notes (Signed)
OFFICE PROGRESS NOTE  CC  Meredith Covey, MD [address] [place] [address]  DIAGNOSIS: 78 year old female with severe and profound anemia likely due to a primary bone marrow problem. 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  #2 patient has been on Procrit every 2 weeks unfortunately this has not effective and we will discontinue this.  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  CURRENT THERAPY: Decitabine cycle 1 day 1  INTERVAL HISTORY: Meredith Rodriguez 78 y.o. female returns for follow up today.  She is feeling well.  During her last visit, she decided to undergo treatment with Decitabine for her AML.  She has had her port placed, and she has had her bone marrow biopsy.  We do not have her biopsy results yet.  She is essentially w/o complaints, with the exception of feeling very tired and like she needs blood.  Otherwise, she is ready for her treatment.    MEDICAL HISTORY: Past Medical History  Diagnosis Date  . Hypertension   . Diabetes mellitus   . Hyperkalemia   . IBS (irritable bowel syndrome)   . Hypercholesterolemia   . Cervical radiculopathy   . Anemia     ALLERGIES:  is allergic to amoxicillin; cephalexin; metformin and related; phenergan; and procardia.  MEDICATIONS:  Current Outpatient Prescriptions  Medication Sig Dispense Refill  . amLODipine (NORVASC) 5 MG tablet Take 2.5 mg by mouth every morning.       Marland Kitchen aspirin 81 MG tablet Take 81 mg by mouth at bedtime.       . calcium gluconate 500 MG tablet Take 500 mg by mouth daily.      Marland Kitchen conjugated estrogens (PREMARIN) vaginal cream Place 0.5 g vaginally once a week. Sunday.      Marland Kitchen dexamethasone (DECADRON) 4 MG tablet Take 2 tablets (8 mg total) by mouth 2 (two) times daily with a meal. Take daily starting the day after chemotherapy  for 2 days. Take with food.  30 tablet  1  . lidocaine-prilocaine (EMLA) cream Apply topically as needed.  30 g  4  . LORazepam (ATIVAN) 0.5 MG tablet Take 0.5 mg by mouth every 6 (six) hours as needed. Nausea      . metoprolol (LOPRESSOR) 50 MG tablet Take 50 mg by mouth 2 (two) times daily.      . Omega-3 Fatty Acids (FISH OIL) 1000 MG CAPS Take 1,000 mg by mouth daily.       . ondansetron (ZOFRAN) 8 MG tablet Take 1 tablet (8 mg total) by mouth 2 (two) times daily. Take two times a day starting the day after chemo for 2 days. Then take two times a day as needed for nausea or vomiting.  30 tablet  1  . prochlorperazine (COMPAZINE) 10 MG tablet Take 1 tablet (10 mg total) by mouth every 6 (six) hours as needed (Nausea or vomiting).  30 tablet  1  . prochlorperazine (COMPAZINE) 25 MG suppository Place 1 suppository (25 mg total) rectally every 12 (twelve) hours as needed for nausea.  12 suppository  3  . simvastatin (ZOCOR) 5 MG tablet Take 5 mg by mouth at bedtime.      Marland Kitchen DISCONTD: glimepiride (AMARYL) 2 MG tablet Take 1 tablet (2 mg total) by mouth every morning.  30 tablet  11   Current Facility-Administered Medications  Medication Dose Route Frequency Provider Last Rate Last Dose  . TDaP (BOOSTRIX) injection 0.5 mL  0.5 mL Intramuscular Once Meredith Covey, MD       Facility-Administered Medications Ordered in Other Visits  Medication Dose Route Frequency Provider Last Rate Last Dose  . 0.9 %  sodium chloride infusion   Intravenous Once Victorino December, MD 20 mL/hr at 02/24/12 1220    . 0.9 %  sodium chloride infusion  250 mL Intravenous Once Augustin Schooling, NP      . acetaminophen (TYLENOL) tablet 650 mg  650 mg Oral Once Augustin Schooling, NP   650 mg at 02/24/12 1329  . decitabine (DACOGEN) 30 mg in sodium chloride 0.9 % 50 mL chemo infusion  20 mg/m2 (Treatment Plan Actual) Intravenous Once Victorino December, MD 56 mL/hr at 02/24/12 1304 30 mg at 02/24/12 1304  . dexamethasone  (DECADRON) injection 10 mg  10 mg Intravenous Once Victorino December, MD   10 mg at 02/24/12 1231  . diphenhydrAMINE (BENADRYL) capsule 25 mg  25 mg Oral Once Augustin Schooling, NP   25 mg at 02/24/12 1329  . heparin lock flush 100 unit/mL  500 Units Intracatheter Once PRN Victorino December, MD   500 Units at 02/24/12 1419  . heparin lock flush 100 unit/mL  500 Units Intracatheter Daily PRN Augustin Schooling, NP      . ondansetron (ZOFRAN) IVPB 8 mg  8 mg Intravenous Once Victorino December, MD   8 mg at 02/24/12 1231  . sodium chloride 0.9 % injection 10 mL  10 mL Intracatheter PRN Victorino December, MD      . sodium chloride 0.9 % injection 10 mL  10 mL Intracatheter PRN Augustin Schooling, NP   10 mL at 02/24/12 1419    SURGICAL HISTORY:  Past Surgical History  Procedure Date  . Varicose veins     surgey 1959  . Tonsilectomy, adenoidectomy, bilateral myringotomy and tubes   . Esophagogastroduodenoscopy 04/01/2011    Procedure: ESOPHAGOGASTRODUODENOSCOPY (EGD);  Surgeon: Barrie Folk, MD;  Location: [facility] ENDOSCOPY;  Service: Endoscopy;  Laterality: N/A;    REVIEW OF SYSTEMS:   General: fatigue (+), night sweats (-), fever (-), pain (-) Lymph: palpable nodes (-) HEENT: vision changes (-), mucositis (-), gum bleeding (-), epistaxis (-) Cardiovascular: chest pain (-), palpitations (-) Pulmonary: shortness of breath (-), dyspnea on exertion (-), cough (-), hemoptysis (-) GI:  Early satiety (-), melena (-), dysphagia (-), nausea/vomiting (-), diarrhea (-) GU: dysuria (-), hematuria (-), incontinence (-) Musculoskeletal: joint swelling (-), joint pain (-), back pain (-) Neuro: weakness (-), numbness (-), headache (-), confusion (-) Skin: Rash (-), lesions (-), dryness (-) Psych: depression (-), suicidal/homicidal ideation (-), feeling of hopelessness (-)   PHYSICAL EXAMINATION: BP 152/57  Pulse 85  Temp 98.6 F (37 C) (Oral)  Resp 20  Ht 5\' 4"  (1.626 m)  Wt 99 lb 8 oz (45.133 kg)  BMI 17.08  kg/m2 General: Patient is a well appearing female in no acute distress HEENT: PERRLA, sclerae anicteric no conjunctival pallor, MMM Neck: supple, no palpable adenopathy Lungs: clear to auscultation bilaterally, no wheezes, rhonchi, or rales Cardiovascular: regular rate rhythm, S1, S2, no murmurs, rubs or gallops Abdomen: Soft, non-tender, non-distended, normoactive bowel sounds, no HSM Extremities: warm and well perfused, no clubbing, cyanosis, or edema Skin: No rashes or lesions Neuro: Non-focal ECOG PERFORMANCE STATUS: 2 - Symptomatic, <50% confined to bed  LABORATORY DATA: Lab Results  Component Value  Date   WBC 16.0* 02/24/2012   HGB 8.1* 02/24/2012   HCT 25.1* 02/24/2012   MCV 90.6 02/24/2012   PLT 167 Large & giant platelets 02/24/2012      Chemistry      Component Value Date/Time   NA 137 02/05/2012 1120   NA 138 12/18/2011 1018   K 4.4 02/05/2012 1120   K 4.7 12/18/2011 1018   CL 101 02/05/2012 1120   CL 103 12/18/2011 1018   CO2 26 02/05/2012 1120   CO2 30 12/18/2011 1018   BUN 20.0 02/05/2012 1120   BUN 19 12/18/2011 1018   CREATININE 0.8 02/05/2012 1120   CREATININE 0.70 12/18/2011 1018   CREATININE 0.69 07/18/2010 1202      Component Value Date/Time   CALCIUM 9.5 02/05/2012 1120   CALCIUM 9.5 12/18/2011 1018   ALKPHOS 51 02/05/2012 1120   ALKPHOS 39 12/18/2011 1018   AST 26 02/05/2012 1120   AST 22 12/18/2011 1018   ALT 28 02/05/2012 1120   ALT 26 12/18/2011 1018   BILITOT 0.90 02/05/2012 1120   BILITOT 0.8 12/18/2011 1018       RADIOGRAPHIC STUDIES:  No results found.  ASSESSMENT: 78 year old female with:  1.  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  2. 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 This is discussed with the daughter and the patient today recommendations were made as below   #3 patient and I and her daughters had an  extensive discussion today about the likely transformation to acute leukemia. We discussed the process of chemotherapy, risks, benefits, and potential adverse effects of the drug.  Through much conversation between myself, the patient, and her children, the patient has decided to proceed with Decitabine chemotherapy.   PLAN:  #1Ms. Molski will receive Decitabine chemotherapy today and for the rest of the week. She will follow with me for weekly count checks.  Today is day 1.    #2 Ms. Albee will receive 2 units of PRBCs tomorrow, as she has symptomatic anemia.  I will see her next Monday for a lab check and appt.     All questions were answered. The patient knows to call the clinic with any problems, questions or concerns. We can certainly see the patient much sooner if necessary.  I spent 25 minutes counseling the patient face to face. The total time spent in the appointment was 30 minutes.  This case was reviewed with Dr. Welton Flakes.    Cherie Ouch Lyn Hollingshead, NP Medical Oncology [facility] Phone: [phone]  02/24/2012, 3:00 PM

## 2012-02-24 NOTE — Patient Instructions (Addendum)
[facility] Discharge Instructions for Patients Receiving Chemotherapy  Today you received the following chemotherapy agents decitabine  To help prevent nausea and vomiting after your treatment, we encourage you to take your nausea medication as directed by MD Begin taking it at 7pm and take it as often as prescribed for the next 48 hours and if needed.   If you develop nausea and vomiting that is not controlled by your nausea medication, call the clinic. If it is after clinic hours your family physician or the after hours number for the clinic or go to the Emergency Department.   BELOW ARE SYMPTOMS THAT SHOULD BE REPORTED IMMEDIATELY:  *FEVER GREATER THAN 100.5 F  *CHILLS WITH OR WITHOUT FEVER  NAUSEA AND VOMITING THAT IS NOT CONTROLLED WITH YOUR NAUSEA MEDICATION  *UNUSUAL SHORTNESS OF BREATH  *UNUSUAL BRUISING OR BLEEDING  TENDERNESS IN MOUTH AND THROAT WITH OR WITHOUT PRESENCE OF ULCERS  *URINARY PROBLEMS  *BOWEL PROBLEMS  UNUSUAL RASH Items with * indicate a potential emergency and should be followed up as soon as possible.  One of the nurses will contact you 24 hours after your treatment. Please let the nurse know about any problems that you may have experienced. Feel free to call the clinic you have any questions or concerns. The clinic phone number is [phone].   I have been informed and understand all the instructions given to me. I know to contact the clinic, my physician, or go to the Emergency Department if any problems should occur. I do not have any questions at this time, but understand that I may call the clinic during office hours   should I have any questions or need assistance in obtaining follow up care.    __________________________________________  _____________  __________ Signature of Patient or Authorized Representative            Date                   Time    __________________________________________ Nurse's  Signature     Blood Transfusion Information WHAT IS A BLOOD TRANSFUSION? A transfusion is the replacement of blood or some of its parts. Blood is made up of multiple cells which provide different functions.  Red blood cells carry oxygen and are used for blood loss replacement.  White blood cells fight against infection.  Platelets control bleeding.  Plasma helps clot blood.  Other blood products are available for specialized needs, such as hemophilia or other clotting disorders. BEFORE THE TRANSFUSION  Who gives blood for transfusions?   You may be able to donate blood to be used at a later date on yourself (autologous donation).  Relatives can be asked to donate blood. This is generally not any safer than if you have received blood from a stranger. The same precautions are taken to ensure safety when a relative's blood is donated.  Healthy volunteers who are fully evaluated to make sure their blood is safe. This is blood bank blood. Transfusion therapy is the safest it has ever been in the practice of medicine. Before blood is taken from a donor, a complete history is taken to make sure that person has no history of diseases nor engages in risky social behavior (examples are intravenous drug use or sexual activity with multiple partners). The donor's travel history is screened to minimize risk of transmitting infections, such as malaria. The donated blood is tested for signs of infectious diseases, such as HIV and hepatitis. The blood is  then tested to be sure it is compatible with you in order to minimize the chance of a transfusion reaction. If you or a relative donates blood, this is often done in anticipation of surgery and is not appropriate for emergency situations. It takes many days to process the donated blood. RISKS AND COMPLICATIONS Although transfusion therapy is very safe and saves many lives, the main dangers of transfusion include:   Getting an infectious  disease.  Developing a transfusion reaction. This is an allergic reaction to something in the blood you were given. Every precaution is taken to prevent this. The decision to have a blood transfusion has been considered carefully by your caregiver before blood is given. Blood is not given unless the benefits outweigh the risks. AFTER THE TRANSFUSION  Right after receiving a blood transfusion, you will usually feel much better and more energetic. This is especially true if your red blood cells have gotten low (anemic). The transfusion raises the level of the red blood cells which carry oxygen, and this usually causes an energy increase.  The nurse administering the transfusion will monitor you carefully for complications. HOME CARE INSTRUCTIONS  No special instructions are needed after a transfusion. You may find your energy is better. Speak with your caregiver about any limitations on activity for underlying diseases you may have. SEEK MEDICAL CARE IF:   Your condition is not improving after your transfusion.  You develop redness or irritation at the intravenous (IV) site. SEEK IMMEDIATE MEDICAL CARE IF:  Any of the following symptoms occur over the next 12 hours:  Shaking chills.  You have a temperature by mouth above 102 F (38.9 C), not controlled by medicine.  Chest, back, or muscle pain.  People around you feel you are not acting correctly or are confused.  Shortness of breath or difficulty breathing.  Dizziness and fainting.  You get a rash or develop hives.  You have a decrease in urine output.  Your urine turns a dark color or changes to pink, red, or brown. Any of the following symptoms occur over the next 10 days:  You have a temperature by mouth above 102 F (38.9 C), not controlled by medicine.  Shortness of breath.  Weakness after normal activity.  The white part of the eye turns yellow (jaundice).  You have a decrease in the amount of urine or are  urinating less often.  Your urine turns a dark color or changes to pink, red, or brown. Document Released: 04/12/2000 Document Revised: 07/08/2011 Document Reviewed: 11/30/2007 [facility] Patient Information 2013 [place], [place].

## 2012-02-25 ENCOUNTER — Ambulatory Visit (HOSPITAL_BASED_OUTPATIENT_CLINIC_OR_DEPARTMENT_OTHER): Payer: Medicare Other | Admitting: Lab

## 2012-02-25 ENCOUNTER — Ambulatory Visit (HOSPITAL_BASED_OUTPATIENT_CLINIC_OR_DEPARTMENT_OTHER): Payer: Medicare Other

## 2012-02-25 ENCOUNTER — Other Ambulatory Visit: Payer: Self-pay | Admitting: Adult Health

## 2012-02-25 VITALS — BP 154/75 | HR 70 | Temp 97.6°F | Resp 20

## 2012-02-25 DIAGNOSIS — C9200 Acute myeloblastic leukemia, not having achieved remission: Secondary | ICD-10-CM

## 2012-02-25 DIAGNOSIS — E119 Type 2 diabetes mellitus without complications: Secondary | ICD-10-CM

## 2012-02-25 DIAGNOSIS — D4620 Refractory anemia with excess of blasts, unspecified: Secondary | ICD-10-CM

## 2012-02-25 DIAGNOSIS — Z5111 Encounter for antineoplastic chemotherapy: Secondary | ICD-10-CM

## 2012-02-25 LAB — WHOLE BLOOD GLUCOSE
Glucose: 195 mg/dL — ABNORMAL HIGH (ref 70–100)
HRS PC: 2 Hours

## 2012-02-25 MED ORDER — SODIUM CHLORIDE 0.9 % IV SOLN
Freq: Once | INTRAVENOUS | Status: AC
  Administered 2012-02-25: 08:00:00 via INTRAVENOUS

## 2012-02-25 MED ORDER — SODIUM CHLORIDE 0.9 % IV SOLN
20.0000 mg/m2 | Freq: Once | INTRAVENOUS | Status: AC
  Administered 2012-02-25: 30 mg via INTRAVENOUS
  Filled 2012-02-25: qty 6

## 2012-02-25 MED ORDER — DIPHENHYDRAMINE HCL 25 MG PO CAPS
25.0000 mg | ORAL_CAPSULE | Freq: Once | ORAL | Status: AC
  Administered 2012-02-25: 25 mg via ORAL

## 2012-02-25 MED ORDER — INSULIN PEN NEEDLE 31G X 5 MM MISC: 100.0000 [IU] | Freq: Three times a day (TID) | Status: DC

## 2012-02-25 MED ORDER — HEPARIN SOD (PORK) LOCK FLUSH 100 UNIT/ML IV SOLN
500.0000 [IU] | Freq: Once | INTRAVENOUS | Status: AC | PRN
  Administered 2012-02-25: 500 [IU]
  Filled 2012-02-25: qty 5

## 2012-02-25 MED ORDER — ONDANSETRON 8 MG/50ML IVPB (CHCC)
8.0000 mg | Freq: Once | INTRAVENOUS | Status: AC
  Administered 2012-02-25: 8 mg via INTRAVENOUS

## 2012-02-25 MED ORDER — DEXAMETHASONE SODIUM PHOSPHATE 10 MG/ML IJ SOLN
10.0000 mg | Freq: Once | INTRAMUSCULAR | Status: AC
  Administered 2012-02-25: 10 mg via INTRAVENOUS

## 2012-02-25 MED ORDER — INSULIN LISPRO 100 UNIT/ML ~~LOC~~ SOLN: 0.0000 [IU] | Freq: Three times a day (TID) | SUBCUTANEOUS | Status: DC

## 2012-02-25 MED ORDER — SODIUM CHLORIDE 0.9 % IJ SOLN
10.0000 mL | INTRAMUSCULAR | Status: DC | PRN
  Administered 2012-02-25: 10 mL
  Filled 2012-02-25: qty 10

## 2012-02-25 MED ORDER — ACETAMINOPHEN 325 MG PO TABS
650.0000 mg | ORAL_TABLET | Freq: Once | ORAL | Status: AC
  Administered 2012-02-25: 650 mg via ORAL

## 2012-02-25 NOTE — Progress Notes (Signed)
1200-Pt states that when she checked her blood sugar via finger stick last night at home, [id number].  This morning Meredith Rodriguez re-checked her BS with a result of 338 via finger stick.  Danford Bad NP notified and to infusion room to assess patient.  1415-Pt instructed on administering insulin SQ to her abdomen.  Teach back done with insulin administration and SS.

## 2012-02-25 NOTE — Patient Instructions (Addendum)
[facility] Discharge Instructions for Patients Receiving Chemotherapy  Today you received the following chemotherapy agents Dacogen To help prevent nausea and vomiting after your treatment, we encourage you to take your nausea medication as prescribed.   If you develop nausea and vomiting that is not controlled by your nausea medication, call the clinic. If it is after clinic hours your family physician or the after hours number for the clinic or go to the Emergency Department.   BELOW ARE SYMPTOMS THAT SHOULD BE REPORTED IMMEDIATELY:  *FEVER GREATER THAN 100.5 F  *CHILLS WITH OR WITHOUT FEVER  NAUSEA AND VOMITING THAT IS NOT CONTROLLED WITH YOUR NAUSEA MEDICATION  *UNUSUAL SHORTNESS OF BREATH  *UNUSUAL BRUISING OR BLEEDING  TENDERNESS IN MOUTH AND THROAT WITH OR WITHOUT PRESENCE OF ULCERS  *URINARY PROBLEMS  *BOWEL PROBLEMS  UNUSUAL RASH Items with * indicate a potential emergency and should be followed up as soon as possible.  One of the nurses will contact you 24 hours after your treatment. Please let the nurse know about any problems that you may have experienced. Feel free to call the clinic you have any questions or concerns. The clinic phone number is [phone].   I have been informed and understand all the instructions given to me. I know to contact the clinic, my physician, or go to the Emergency Department if any problems should occur. I do not have any questions at this time, but understand that I may call the clinic during office hours   should I have any questions or need assistance in obtaining follow up care.    __________________________________________  _____________  __________ Signature of Patient or Authorized Representative            Date                   Time    __________________________________________ Nurse's Signature   Blood Transfusion Information WHAT IS A BLOOD TRANSFUSION? A transfusion is the replacement of blood or  some of its parts. Blood is made up of multiple cells which provide different functions.  Red blood cells carry oxygen and are used for blood loss replacement.  White blood cells fight against infection.  Platelets control bleeding.  Plasma helps clot blood.  Other blood products are available for specialized needs, such as hemophilia or other clotting disorders. BEFORE THE TRANSFUSION  Who gives blood for transfusions?   You may be able to donate blood to be used at a later date on yourself (autologous donation).  Relatives can be asked to donate blood. This is generally not any safer than if you have received blood from a stranger. The same precautions are taken to ensure safety when a relative's blood is donated.  Healthy volunteers who are fully evaluated to make sure their blood is safe. This is blood bank blood. Transfusion therapy is the safest it has ever been in the practice of medicine. Before blood is taken from a donor, a complete history is taken to make sure that person has no history of diseases nor engages in risky social behavior (examples are intravenous drug use or sexual activity with multiple partners). The donor's travel history is screened to minimize risk of transmitting infections, such as malaria. The donated blood is tested for signs of infectious diseases, such as HIV and hepatitis. The blood is then tested to be sure it is compatible with you in order to minimize the chance of a transfusion reaction. If you or a relative  donates blood, this is often done in anticipation of surgery and is not appropriate for emergency situations. It takes many days to process the donated blood. RISKS AND COMPLICATIONS Although transfusion therapy is very safe and saves many lives, the main dangers of transfusion include:   Getting an infectious disease.  Developing a transfusion reaction. This is an allergic reaction to something in the blood you were given. Every precaution is  taken to prevent this. The decision to have a blood transfusion has been considered carefully by your caregiver before blood is given. Blood is not given unless the benefits outweigh the risks. AFTER THE TRANSFUSION  Right after receiving a blood transfusion, you will usually feel much better and more energetic. This is especially true if your red blood cells have gotten low (anemic). The transfusion raises the level of the red blood cells which carry oxygen, and this usually causes an energy increase.  The nurse administering the transfusion will monitor you carefully for complications. HOME CARE INSTRUCTIONS  No special instructions are needed after a transfusion. You may find your energy is better. Speak with your caregiver about any limitations on activity for underlying diseases you may have. SEEK MEDICAL CARE IF:   Your condition is not improving after your transfusion.  You develop redness or irritation at the intravenous (IV) site. SEEK IMMEDIATE MEDICAL CARE IF:  Any of the following symptoms occur over the next 12 hours:  Shaking chills.  You have a temperature by mouth above 102 F (38.9 C), not controlled by medicine.  Chest, back, or muscle pain.  People around you feel you are not acting correctly or are confused.  Shortness of breath or difficulty breathing.  Dizziness and fainting.  You get a rash or develop hives.  You have a decrease in urine output.  Your urine turns a dark color or changes to pink, red, or brown. Any of the following symptoms occur over the next 10 days:  You have a temperature by mouth above 102 F (38.9 C), not controlled by medicine.  Shortness of breath.  Weakness after normal activity.  The white part of the eye turns yellow (jaundice).  You have a decrease in the amount of urine or are urinating less often.  Your urine turns a dark color or changes to pink, red, or brown. Document Released: 04/12/2000 Document Revised:  07/08/2011 Document Reviewed: 11/30/2007 [facility] Patient Information 2013 [place], [place].

## 2012-02-25 NOTE — Progress Notes (Signed)
Meredith Rodriguez, has been hyperglycemic for the past 24 hours.  Went by the infusion room and gave her these insulin orders.  Called in a Humalog kwikpen to her pharmacy.    She received a copy of these instructions.    Please check your blood sugar three times a day before meals and at bedtime.  Please keep a record of your blood sugars to bring into your next appointment.  Only take the correction insulin with your 3 meals.  This insulin is very fast acting, so ensure that your meal is on the table before injecting.   Blood Sugar Insulin Dose  70-150 Nothing  151-200 2 units  201-250 3 units  251-300 4 units  301-350 5 units  351-400 6 units  Greater than 400 6 units and call office

## 2012-02-26 ENCOUNTER — Ambulatory Visit (HOSPITAL_BASED_OUTPATIENT_CLINIC_OR_DEPARTMENT_OTHER): Payer: Medicare Other

## 2012-02-26 ENCOUNTER — Ambulatory Visit (HOSPITAL_BASED_OUTPATIENT_CLINIC_OR_DEPARTMENT_OTHER): Payer: Medicare Other | Admitting: Lab

## 2012-02-26 ENCOUNTER — Other Ambulatory Visit: Payer: Medicare Other | Admitting: Lab

## 2012-02-26 ENCOUNTER — Ambulatory Visit: Payer: Medicare Other

## 2012-02-26 ENCOUNTER — Other Ambulatory Visit: Payer: Self-pay | Admitting: Lab

## 2012-02-26 VITALS — BP 186/69 | HR 75 | Temp 97.1°F | Resp 20

## 2012-02-26 DIAGNOSIS — Z5111 Encounter for antineoplastic chemotherapy: Secondary | ICD-10-CM

## 2012-02-26 DIAGNOSIS — C9200 Acute myeloblastic leukemia, not having achieved remission: Secondary | ICD-10-CM

## 2012-02-26 DIAGNOSIS — D4620 Refractory anemia with excess of blasts, unspecified: Secondary | ICD-10-CM

## 2012-02-26 DIAGNOSIS — E119 Type 2 diabetes mellitus without complications: Secondary | ICD-10-CM

## 2012-02-26 DIAGNOSIS — D649 Anemia, unspecified: Secondary | ICD-10-CM

## 2012-02-26 LAB — PREPARE RBC (CROSSMATCH)

## 2012-02-26 LAB — WHOLE BLOOD GLUCOSE
Glucose: 215 mg/dL — ABNORMAL HIGH (ref 70–100)
HRS PC: 6.5 Hours

## 2012-02-26 MED ORDER — SODIUM CHLORIDE 0.9 % IV SOLN
20.0000 mg/m2 | Freq: Once | INTRAVENOUS | Status: AC
  Administered 2012-02-26: 30 mg via INTRAVENOUS
  Filled 2012-02-26: qty 6

## 2012-02-26 MED ORDER — DEXAMETHASONE SODIUM PHOSPHATE 10 MG/ML IJ SOLN
10.0000 mg | Freq: Once | INTRAMUSCULAR | Status: AC
  Administered 2012-02-26: 10 mg via INTRAVENOUS

## 2012-02-26 MED ORDER — SODIUM CHLORIDE 0.9 % IV SOLN
Freq: Once | INTRAVENOUS | Status: AC
  Administered 2012-02-26: 10:00:00 via INTRAVENOUS

## 2012-02-26 MED ORDER — HEPARIN SOD (PORK) LOCK FLUSH 100 UNIT/ML IV SOLN
500.0000 [IU] | Freq: Once | INTRAVENOUS | Status: DC | PRN
  Filled 2012-02-26: qty 5

## 2012-02-26 MED ORDER — DIPHENHYDRAMINE HCL 25 MG PO CAPS
25.0000 mg | ORAL_CAPSULE | Freq: Once | ORAL | Status: AC
  Administered 2012-02-26: 25 mg via ORAL

## 2012-02-26 MED ORDER — SODIUM CHLORIDE 0.9 % IV SOLN: 250.0000 mL | Freq: Once | INTRAVENOUS | Status: DC

## 2012-02-26 MED ORDER — ACETAMINOPHEN 325 MG PO TABS
650.0000 mg | ORAL_TABLET | Freq: Once | ORAL | Status: AC
  Administered 2012-02-26: 650 mg via ORAL

## 2012-02-26 MED ORDER — ONDANSETRON 8 MG/50ML IVPB (CHCC)
8.0000 mg | Freq: Once | INTRAVENOUS | Status: AC
  Administered 2012-02-26: 8 mg via INTRAVENOUS

## 2012-02-26 MED ORDER — SODIUM CHLORIDE 0.9 % IJ SOLN
10.0000 mL | INTRAMUSCULAR | Status: DC | PRN
  Filled 2012-02-26: qty 10

## 2012-02-26 NOTE — Patient Instructions (Addendum)
[facility] Discharge Instructions for Patients Receiving Chemotherapy  Today you received the following chemotherapy agents:  Dacogen  To help prevent nausea and vomiting after your treatment, we encourage you to take your nausea medication If you develop nausea and vomiting that is not controlled by your nausea medication, call the clinic. If it is after clinic hours your family physician or the after hours number for the clinic or go to the Emergency Department.   BELOW ARE SYMPTOMS THAT SHOULD BE REPORTED IMMEDIATELY:  *FEVER GREATER THAN 100.5 F  *CHILLS WITH OR WITHOUT FEVER  NAUSEA AND VOMITING THAT IS NOT CONTROLLED WITH YOUR NAUSEA MEDICATION  *UNUSUAL SHORTNESS OF BREATH  *UNUSUAL BRUISING OR BLEEDING  TENDERNESS IN MOUTH AND THROAT WITH OR WITHOUT PRESENCE OF ULCERS  *URINARY PROBLEMS  *BOWEL PROBLEMS  UNUSUAL RASH Items with * indicate a potential emergency and should be followed up as soon as possible.  Blood Transfusion Information WHAT IS A BLOOD TRANSFUSION? A transfusion is the replacement of blood or some of its parts. Blood is made up of multiple cells which provide different functions.  Red blood cells carry oxygen and are used for blood loss replacement.  White blood cells fight against infection.  Platelets control bleeding.  Plasma helps clot blood.  Other blood products are available for specialized needs, such as hemophilia or other clotting disorders. BEFORE THE TRANSFUSION  Who gives blood for transfusions?   You may be able to donate blood to be used at a later date on yourself (autologous donation).  Relatives can be asked to donate blood. This is generally not any safer than if you have received blood from a stranger. The same precautions are taken to ensure safety when a relative's blood is donated.  Healthy volunteers who are fully evaluated to make sure their blood is safe. This is blood bank blood. Transfusion therapy  is the safest it has ever been in the practice of medicine. Before blood is taken from a donor, a complete history is taken to make sure that person has no history of diseases nor engages in risky social behavior (examples are intravenous drug use or sexual activity with multiple partners). The donor's travel history is screened to minimize risk of transmitting infections, such as malaria. The donated blood is tested for signs of infectious diseases, such as HIV and hepatitis. The blood is then tested to be sure it is compatible with you in order to minimize the chance of a transfusion reaction. If you or a relative donates blood, this is often done in anticipation of surgery and is not appropriate for emergency situations. It takes many days to process the donated blood. RISKS AND COMPLICATIONS Although transfusion therapy is very safe and saves many lives, the main dangers of transfusion include:   Getting an infectious disease.  Developing a transfusion reaction. This is an allergic reaction to something in the blood you were given. Every precaution is taken to prevent this. The decision to have a blood transfusion has been considered carefully by your caregiver before blood is given. Blood is not given unless the benefits outweigh the risks. AFTER THE TRANSFUSION  Right after receiving a blood transfusion, you will usually feel much better and more energetic. This is especially true if your red blood cells have gotten low (anemic). The transfusion raises the level of the red blood cells which carry oxygen, and this usually causes an energy increase.  The nurse administering the transfusion will monitor you carefully for  complications. HOME CARE INSTRUCTIONS  No special instructions are needed after a transfusion. You may find your energy is better. Speak with your caregiver about any limitations on activity for underlying diseases you may have. SEEK MEDICAL CARE IF:   Your condition is not  improving after your transfusion.  You develop redness or irritation at the intravenous (IV) site. SEEK IMMEDIATE MEDICAL CARE IF:  Any of the following symptoms occur over the next 12 hours:  Shaking chills.  You have a temperature by mouth above 102 F (38.9 C), not controlled by medicine.  Chest, back, or muscle pain.  People around you feel you are not acting correctly or are confused.  Shortness of breath or difficulty breathing.  Dizziness and fainting.  You get a rash or develop hives.  You have a decrease in urine output.  Your urine turns a dark color or changes to pink, red, or brown. Any of the following symptoms occur over the next 10 days:  You have a temperature by mouth above 102 F (38.9 C), not controlled by medicine.  Shortness of breath.  Weakness after normal activity.  The white part of the eye turns yellow (jaundice).  You have a decrease in the amount of urine or are urinating less often.  Your urine turns a dark color or changes to pink, red, or brown. Document Released: 04/12/2000 Document Revised: 07/08/2011 Document Reviewed: 11/30/2007 [facility] Patient Information 2013 [place], [place].  Please let the nurse know about any problems that you may have experienced. Feel free to call the clinic you have any questions or concerns. The clinic phone number is [phone].   I have been informed and understand all the instructions given to me. I know to contact the clinic, my physician, or go to the Emergency Department if any problems should occur. I do not have any questions at this time, but understand that I may call the clinic during office hours   should I have any questions or need assistance in obtaining follow up care.    __________________________________________  _____________  __________ Signature of Patient or Authorized Representative            Date                   Time    __________________________________________ Nurse's  Signature

## 2012-02-27 ENCOUNTER — Ambulatory Visit (HOSPITAL_BASED_OUTPATIENT_CLINIC_OR_DEPARTMENT_OTHER): Payer: Medicare Other

## 2012-02-27 VITALS — BP 183/72 | Temp 98.3°F

## 2012-02-27 DIAGNOSIS — C9200 Acute myeloblastic leukemia, not having achieved remission: Secondary | ICD-10-CM

## 2012-02-27 DIAGNOSIS — C9500 Acute leukemia of unspecified cell type not having achieved remission: Secondary | ICD-10-CM

## 2012-02-27 DIAGNOSIS — D4620 Refractory anemia with excess of blasts, unspecified: Secondary | ICD-10-CM

## 2012-02-27 DIAGNOSIS — Z5111 Encounter for antineoplastic chemotherapy: Secondary | ICD-10-CM

## 2012-02-27 LAB — TYPE AND SCREEN
Antibody Screen: POSITIVE
DAT, IgG: POSITIVE
PT AG Type: NEGATIVE
Unit division: 0

## 2012-02-27 MED ORDER — HEPARIN SOD (PORK) LOCK FLUSH 100 UNIT/ML IV SOLN
500.0000 [IU] | Freq: Once | INTRAVENOUS | Status: AC | PRN
  Administered 2012-02-27: 500 [IU]
  Filled 2012-02-27: qty 5

## 2012-02-27 MED ORDER — SODIUM CHLORIDE 0.9 % IV SOLN
Freq: Once | INTRAVENOUS | Status: AC
  Administered 2012-02-27: 10:00:00 via INTRAVENOUS

## 2012-02-27 MED ORDER — SODIUM CHLORIDE 0.9 % IV SOLN
20.0000 mg/m2 | Freq: Once | INTRAVENOUS | Status: AC
  Administered 2012-02-27: 30 mg via INTRAVENOUS
  Filled 2012-02-27: qty 6

## 2012-02-27 MED ORDER — DEXAMETHASONE SODIUM PHOSPHATE 10 MG/ML IJ SOLN
10.0000 mg | Freq: Once | INTRAMUSCULAR | Status: AC
  Administered 2012-02-27: 10 mg via INTRAVENOUS

## 2012-02-27 MED ORDER — ONDANSETRON 8 MG/50ML IVPB (CHCC)
8.0000 mg | Freq: Once | INTRAVENOUS | Status: AC
  Administered 2012-02-27: 8 mg via INTRAVENOUS

## 2012-02-27 MED ORDER — SODIUM CHLORIDE 0.9 % IJ SOLN
10.0000 mL | INTRAMUSCULAR | Status: DC | PRN
  Administered 2012-02-27: 10 mL
  Filled 2012-02-27: qty 10

## 2012-02-27 NOTE — Patient Instructions (Addendum)
[facility] Discharge Instructions for Patients Receiving Chemotherapy  Today you received the following chemotherapy agents Dacogen.   To help prevent nausea and vomiting after your treatment, we encourage you to take your nausea medication as needed.   If you develop nausea and vomiting that is not controlled by your nausea medication, call the clinic. If it is after clinic hours your family physician or the after hours number for the clinic or go to the Emergency Department.   BELOW ARE SYMPTOMS THAT SHOULD BE REPORTED IMMEDIATELY:  *FEVER GREATER THAN 100.5 F  *CHILLS WITH OR WITHOUT FEVER  NAUSEA AND VOMITING THAT IS NOT CONTROLLED WITH YOUR NAUSEA MEDICATION  *UNUSUAL SHORTNESS OF BREATH  *UNUSUAL BRUISING OR BLEEDING  TENDERNESS IN MOUTH AND THROAT WITH OR WITHOUT PRESENCE OF ULCERS  *URINARY PROBLEMS  *BOWEL PROBLEMS  UNUSUAL RASH Items with * indicate a potential emergency and should be followed up as soon as possible.  One of the nurses will contact you 24 hours after your treatment. Please let the nurse know about any problems that you may have experienced. Feel free to call the clinic you have any questions or concerns. The clinic phone number is [phone].

## 2012-02-27 NOTE — Progress Notes (Signed)
1005 Augustin Schooling, NP notified of pt reporting CBG at 9:20pm last night was 428 and then this am at 6:35am CBG was 274.  Pt and daughter have many questions concerning the "insulin pen" and about checking blood sugars at home.  Pt interested in Diabetic teaching referral.  Desk RN aware.

## 2012-02-28 ENCOUNTER — Ambulatory Visit (HOSPITAL_BASED_OUTPATIENT_CLINIC_OR_DEPARTMENT_OTHER): Payer: Medicare Other

## 2012-02-28 ENCOUNTER — Telehealth: Payer: Self-pay | Admitting: Hematology & Oncology

## 2012-02-28 ENCOUNTER — Ambulatory Visit: Payer: Medicare Other | Admitting: Nutrition

## 2012-02-28 ENCOUNTER — Other Ambulatory Visit: Payer: Self-pay | Admitting: Hematology & Oncology

## 2012-02-28 VITALS — BP 167/74 | HR 87 | Temp 97.7°F | Resp 20

## 2012-02-28 DIAGNOSIS — Z5111 Encounter for antineoplastic chemotherapy: Secondary | ICD-10-CM

## 2012-02-28 DIAGNOSIS — N390 Urinary tract infection, site not specified: Secondary | ICD-10-CM

## 2012-02-28 DIAGNOSIS — C9200 Acute myeloblastic leukemia, not having achieved remission: Secondary | ICD-10-CM

## 2012-02-28 LAB — CHROMOSOME ANALYSIS, BONE MARROW

## 2012-02-28 MED ORDER — ONDANSETRON 8 MG/50ML IVPB (CHCC)
8.0000 mg | Freq: Once | INTRAVENOUS | Status: AC
  Administered 2012-02-28: 8 mg via INTRAVENOUS

## 2012-02-28 MED ORDER — SULFAMETHOXAZOLE-TRIMETHOPRIM 800-160 MG PO TABS: 1.0000 | ORAL_TABLET | Freq: Two times a day (BID) | ORAL | Status: DC

## 2012-02-28 MED ORDER — SODIUM CHLORIDE 0.9 % IJ SOLN
10.0000 mL | INTRAMUSCULAR | Status: DC | PRN
  Administered 2012-02-28: 10 mL
  Filled 2012-02-28: qty 10

## 2012-02-28 MED ORDER — HEPARIN SOD (PORK) LOCK FLUSH 100 UNIT/ML IV SOLN
500.0000 [IU] | Freq: Once | INTRAVENOUS | Status: AC | PRN
  Administered 2012-02-28: 500 [IU]
  Filled 2012-02-28: qty 5

## 2012-02-28 MED ORDER — SODIUM CHLORIDE 0.9 % IV SOLN
20.0000 mg/m2 | Freq: Once | INTRAVENOUS | Status: AC
  Administered 2012-02-28: 30 mg via INTRAVENOUS
  Filled 2012-02-28: qty 6

## 2012-02-28 MED ORDER — SODIUM CHLORIDE 0.9 % IV SOLN
Freq: Once | INTRAVENOUS | Status: AC
  Administered 2012-02-28: 12:00:00 via INTRAVENOUS

## 2012-02-28 MED ORDER — DEXAMETHASONE SODIUM PHOSPHATE 10 MG/ML IJ SOLN
10.0000 mg | Freq: Once | INTRAMUSCULAR | Status: AC
  Administered 2012-02-28: 10 mg via INTRAVENOUS

## 2012-02-28 NOTE — Patient Instructions (Addendum)
[facility] Discharge Instructions for Patients Receiving Chemotherapy  Today you received the following chemotherapy agents Dacogen.  To help prevent nausea and vomiting after your treatment, we encourage you to take your nausea medication as directed.   If you develop nausea and vomiting that is not controlled by your nausea medication, call the clinic. If it is after clinic hours your family physician or the after hours number for the clinic or go to the Emergency Department.   BELOW ARE SYMPTOMS THAT SHOULD BE REPORTED IMMEDIATELY:  *FEVER GREATER THAN 100.5 F  *CHILLS WITH OR WITHOUT FEVER  NAUSEA AND VOMITING THAT IS NOT CONTROLLED WITH YOUR NAUSEA MEDICATION  *UNUSUAL SHORTNESS OF BREATH  *UNUSUAL BRUISING OR BLEEDING  TENDERNESS IN MOUTH AND THROAT WITH OR WITHOUT PRESENCE OF ULCERS  *URINARY PROBLEMS  *BOWEL PROBLEMS  UNUSUAL RASH Items with * indicate a potential emergency and should be followed up as soon as possible.  Feel free to call the clinic you have any questions or concerns. The clinic phone number is [phone].

## 2012-02-28 NOTE — Progress Notes (Signed)
This patient is an 78 year old female diagnosed with anemia. Past medical history includes hypertension, diabetes, hyperkalemia, IBS, and hypercholesterolemia. Medications include Decadron, Humalog, Ativan, Lopressor, Compazine, and Zocor. Labs include glucose of 215, 195, 262, 146.  Height: 64 inches. Weight: 99.5 pounds. BMI: 17.07.  RN requested diabetic diet teaching for patient. Patient has been managing her diabetes with her diet alone. She now is on sliding scale insulin secondary to elevated blood sugars associated with her treatment. Patient is concerned. Patient reports eating 3 regular meals a day. She sometimes eats a bedtime snack. She does eat smaller amounts but does appear to consume higher fiber foods. She does not regularly consume sweetened beverages or sugar. She is taking her blood sugars before meals and using sliding scale to give herself insulin per physician instruction.  Nutrition diagnosis: Food and nutrition related knowledge deficit related to diabetes as evidenced by patient's self-report of inadequate information.  Intervention: Patient and family were educated on the importance of patient consuming regular meals with a combination of protein, healthy fat and complex carbohydrate. I have encouraged her to continue to consume healthy foods when she is able. I have stressed the importance of her continuing to eat and not skipping meals. She will continue to journal her blood sugars and her insulin to provide M.D. with information. I did discourage patient from consuming simple carbohydrates by themselves but have instructed her on how she can add some simple, lower concentrated sweets desserts to her meals as desired. I've answered her questions. I provided fact sheets for her to take with her today.  Monitoring, evaluation, goals: Patient will tolerate a carbohydrate-controlled diet to minimize weight loss and to achieve glycemic control. I have encouraged patient to  followup with diabetic educator as needed.  Next visit: Patient will followup with diabetic educator as needed.

## 2012-02-28 NOTE — Telephone Encounter (Signed)
She called stating that she had burning and blood with urination.  No fever.  Started today.m She is getting Dacogen.    I called in Bactrim DS to take for 5 days.   She will take this 2x a day.  I told her to start this tonight.    She sees the PA on Monday.  I will pray for her!!!!!  Hewitt Shorts.

## 2012-03-02 ENCOUNTER — Ambulatory Visit (HOSPITAL_BASED_OUTPATIENT_CLINIC_OR_DEPARTMENT_OTHER): Payer: Medicare Other | Admitting: Oncology

## 2012-03-02 ENCOUNTER — Other Ambulatory Visit (HOSPITAL_BASED_OUTPATIENT_CLINIC_OR_DEPARTMENT_OTHER): Payer: Medicare Other | Admitting: Lab

## 2012-03-02 VITALS — BP 167/68 | HR 66 | Temp 97.8°F | Resp 20 | Ht 64.0 in | Wt 96.5 lb

## 2012-03-02 DIAGNOSIS — C9200 Acute myeloblastic leukemia, not having achieved remission: Secondary | ICD-10-CM

## 2012-03-02 DIAGNOSIS — D649 Anemia, unspecified: Secondary | ICD-10-CM

## 2012-03-02 LAB — CBC WITH DIFFERENTIAL/PLATELET
EOS%: 0 % (ref 0.0–7.0)
MCH: 29.1 pg (ref 25.1–34.0)
MCV: 85.3 fL (ref 79.5–101.0)
MONO%: 0.2 % (ref 0.0–14.0)
NEUT#: 5 10*3/uL (ref 1.5–6.5)
RBC: 4.7 10*6/uL (ref 3.70–5.45)
RDW: 15.2 % — ABNORMAL HIGH (ref 11.2–14.5)
nRBC: 0 % (ref 0–0)

## 2012-03-02 LAB — COMPREHENSIVE METABOLIC PANEL (CC13)
ALT: 44 U/L (ref 0–55)
AST: 22 U/L (ref 5–34)
Alkaline Phosphatase: 45 U/L (ref 40–150)
Sodium: 131 mEq/L — ABNORMAL LOW (ref 136–145)
Total Bilirubin: 0.85 mg/dL (ref 0.20–1.20)
Total Protein: 7.1 g/dL (ref 6.4–8.3)

## 2012-03-02 NOTE — Patient Instructions (Addendum)
Continue checking your blood sugars  We will see you back in 1 wek for follow up

## 2012-03-03 ENCOUNTER — Telehealth: Payer: Self-pay | Admitting: *Deleted

## 2012-03-03 NOTE — Telephone Encounter (Signed)
Message copied by Augusto Garbe on Tue Mar 03, 2012  2:30 PM ------      Message from: Gaylord Shih      Created: Mon Feb 24, 2012  4:28 PM      Regarding: chemo follow up       Decitabine for 5 days. Dr Welton Flakes. And 2 units of blood 2/29.

## 2012-03-03 NOTE — Telephone Encounter (Signed)
Spoke with patient today after last week's first cycle of dacogen.  She says she is not doing well today.  Is lying around trying to rest because she is tired.  "My stomach is upset really bad."  Denies n/v but reports having had 4-5 bowel movements today, last at 10:30 am.  Denies any blood or tarry but brown to green in color.  It has been four hours since last bm, she has not taken anything and feels it was something she ate.  A friend brought food for her last night ("lima beans, pineapple casserole, mostly carbs").  She has eaten breakfast and able to drink.  Asked how much she should drink.  Instructed to drink more than 64 oz daily.  Reports low sodium level years ago and this nurse noted sodium = 131 on yesterday.  Encouraged to drink sports drinks and things with salt.  K+ levl wnl at 4.5 on yesterday.  No further questions.  Instructed to call if diarrhea returns and if needed.

## 2012-03-06 ENCOUNTER — Other Ambulatory Visit: Payer: Self-pay | Admitting: Family Medicine

## 2012-03-06 NOTE — Telephone Encounter (Signed)
Caller: Kristopher/Patient; Patient Name: Meredith Rodriguez; PCP: Evelena Peat [facility]); Best Callback Phone Number: [phone].  Patient calling about blood sugars.  States had chemo a week ago, and this has made her blood sugars  run high.  Has increased her testing to QID, so needs refill on test strips.  States managing her blood sugars with sliding scale, and has plenty of insulin, but needs strips for her Freestyle meter. Per epic, no strips seen on medication list to see if valid refills; info to office for provider review/Rx/callback.   Uses [facility]   May reach patient at [phone].

## 2012-03-09 ENCOUNTER — Encounter (HOSPITAL_COMMUNITY)
Admission: RE | Admit: 2012-03-09 | Discharge: 2012-03-09 | Disposition: A | Discharge status: Home / Self Care | Payer: Medicare Other | Source: Ambulatory Visit | Attending: Oncology | Admitting: Oncology

## 2012-03-09 ENCOUNTER — Encounter: Payer: Self-pay | Admitting: Adult Health

## 2012-03-09 ENCOUNTER — Telehealth: Payer: Self-pay | Admitting: Oncology

## 2012-03-09 ENCOUNTER — Ambulatory Visit (HOSPITAL_BASED_OUTPATIENT_CLINIC_OR_DEPARTMENT_OTHER): Payer: Medicare Other | Admitting: Adult Health

## 2012-03-09 ENCOUNTER — Other Ambulatory Visit (HOSPITAL_BASED_OUTPATIENT_CLINIC_OR_DEPARTMENT_OTHER): Payer: Medicare Other

## 2012-03-09 VITALS — BP 155/74 | HR 67 | Temp 97.5°F | Resp 20 | Ht 64.0 in | Wt 98.2 lb

## 2012-03-09 DIAGNOSIS — C9200 Acute myeloblastic leukemia, not having achieved remission: Secondary | ICD-10-CM

## 2012-03-09 DIAGNOSIS — D709 Neutropenia, unspecified: Secondary | ICD-10-CM

## 2012-03-09 DIAGNOSIS — D649 Anemia, unspecified: Secondary | ICD-10-CM | POA: Insufficient documentation

## 2012-03-09 DIAGNOSIS — D696 Thrombocytopenia, unspecified: Secondary | ICD-10-CM

## 2012-03-09 DIAGNOSIS — E875 Hyperkalemia: Secondary | ICD-10-CM

## 2012-03-09 LAB — CBC WITH DIFFERENTIAL/PLATELET
Basophils Absolute: 0 10*3/uL (ref 0.0–0.1)
EOS%: 1.2 % (ref 0.0–7.0)
Eosinophils Absolute: 0 10*3/uL (ref 0.0–0.5)
HGB: 9.9 g/dL — ABNORMAL LOW (ref 11.6–15.9)
MCH: 28.9 pg (ref 25.1–34.0)
MONO#: 0 10*3/uL — ABNORMAL LOW (ref 0.1–0.9)
NEUT#: 0.1 10*3/uL — CL (ref 1.5–6.5)
RDW: 14.5 % (ref 11.2–14.5)
WBC: 0.9 10*3/uL — CL (ref 3.9–10.3)
lymph#: 0.7 10*3/uL — ABNORMAL LOW (ref 0.9–3.3)

## 2012-03-09 LAB — COMPREHENSIVE METABOLIC PANEL (CC13)
Albumin: 3.3 g/dL — ABNORMAL LOW (ref 3.5–5.0)
Alkaline Phosphatase: 38 U/L — ABNORMAL LOW (ref 40–150)
Glucose: 316 mg/dl — ABNORMAL HIGH (ref 70–99)
Potassium: 5.6 mEq/L — ABNORMAL HIGH (ref 3.5–5.1)
Sodium: 133 mEq/L — ABNORMAL LOW (ref 136–145)
Total Protein: 5.7 g/dL — ABNORMAL LOW (ref 6.4–8.3)

## 2012-03-09 MED ORDER — CIPROFLOXACIN HCL 500 MG PO TABS: 500.0000 mg | ORAL_TABLET | Freq: Two times a day (BID) | ORAL | Status: DC

## 2012-03-09 MED ORDER — SODIUM POLYSTYRENE SULFONATE 15 GM/60ML PO SUSP: 15.0000 g | ORAL | Status: DC

## 2012-03-09 NOTE — Telephone Encounter (Signed)
gve the pt her nov 2013 appt calendar °

## 2012-03-09 NOTE — Progress Notes (Signed)
OFFICE PROGRESS NOTE  CC  [name], MD [address] [place] [address]  DIAGNOSIS: 78 year old female with severe and profound anemia likely due to a primary bone marrow problem. 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  #2 patient has been on Procrit every 2 weeks unfortunately this has not effective and we will discontinue this.  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  CURRENT THERAPY: Decitabine cycle 1 day 15  INTERVAL HISTORY: Meredith Rodriguez 78 y.o. female returns for follow up today.  She is doing well.  Her counts have nadired.  She is neutropenic and thrombocytopenic.  She denies fevers and chills, and blood in her stool, sputum, or from her gums or nose.  She is doing well.  She is still managing her blood sugar with correction insulin.  She does have a tiny ulcer in her right buccal mucosa, otherwise she is feeling well and w/o questions/concerns.   MEDICAL HISTORY: Past Medical History  Diagnosis Date  . Hypertension   . Diabetes mellitus   . Hyperkalemia   . IBS (irritable bowel syndrome)   . Hypercholesterolemia   . Cervical radiculopathy   . Anemia     ALLERGIES:  is allergic to amoxicillin; cephalexin; metformin and related; phenergan; and procardia.  MEDICATIONS:  Current Outpatient Prescriptions  Medication Sig Dispense Refill  . amLODipine (NORVASC) 5 MG tablet Take 2.5 mg by mouth every morning.       Marland Kitchen aspirin 81 MG tablet Take 81 mg by mouth at bedtime.       . calcium gluconate 500 MG tablet Take 500 mg by mouth daily.      Marland Kitchen conjugated estrogens (PREMARIN) vaginal cream Place 0.5 g vaginally once a week. Sunday.      Marland Kitchen dexamethasone (DECADRON) 4 MG tablet Take 2 tablets (8 mg total) by mouth 2 (two) times daily with a meal. Take daily starting the day after  chemotherapy for 2 days. Take with food.  30 tablet  1  . FREESTYLE LITE test strip TEST SUGAR TWICE A DAY AS INSTRUCTED  100 each  11  . insulin lispro (HUMALOG KWIKPEN) 100 UNIT/ML injection Inject 0-6 Units into the skin 3 (three) times daily before meals.  3 mL  12  . Insulin Pen Needle (B-D UF III MINI PEN NEEDLES) 31G X 5 MM MISC 100 Units by Does not apply route 3 (three) times daily.  100 each  0  . lidocaine-prilocaine (EMLA) cream Apply topically as needed.  30 g  4  . LORazepam (ATIVAN) 0.5 MG tablet Take 0.5 mg by mouth every 6 (six) hours as needed. Nausea      . metoprolol (LOPRESSOR) 50 MG tablet Take 50 mg by mouth 2 (two) times daily.      . Omega-3 Fatty Acids (FISH OIL) 1000 MG CAPS Take 1,000 mg by mouth daily.       . ondansetron (ZOFRAN) 8 MG tablet Take 1 tablet (8 mg total) by mouth 2 (two) times daily. Take two times a day starting the day after chemo for 2 days. Then take two times a day as needed for nausea or vomiting.  30 tablet  1  . prochlorperazine (COMPAZINE) 10 MG tablet Take 1 tablet (10 mg total) by mouth every 6 (six) hours as needed (Nausea or vomiting).  30 tablet  1  . prochlorperazine (COMPAZINE) 25 MG suppository Place 1 suppository (25 mg total) rectally every 12 (twelve) hours as needed for nausea.  12 suppository  3  . ciprofloxacin (CIPRO) 500 MG tablet Take 1 tablet (500 mg total) by mouth 2 (two) times daily.  14 tablet  6  . simvastatin (ZOCOR) 5 MG tablet Take 5 mg by mouth at bedtime.      . sodium polystyrene (SPS) 15 GM/60ML suspension Take 60 mLs (15 g total) by mouth every 4 (four) hours.  500 mL  0  . sulfamethoxazole-trimethoprim (BACTRIM DS,SEPTRA DS) 800-160 MG per tablet Take 1 tablet by mouth 2 (two) times daily.  10 tablet  0  . [DISCONTINUED] glimepiride (AMARYL) 2 MG tablet Take 1 tablet (2 mg total) by mouth every morning.  30 tablet  11   Current Facility-Administered Medications  Medication Dose Route Frequency Provider Last Rate  Last Dose  . TDaP (BOOSTRIX) injection 0.5 mL  0.5 mL Intramuscular Once [name], MD        SURGICAL HISTORY:  Past Surgical History  Procedure Date  . Varicose veins     surgey 1959  . Tonsilectomy, adenoidectomy, bilateral myringotomy and tubes   . Esophagogastroduodenoscopy 04/01/2011    Procedure: ESOPHAGOGASTRODUODENOSCOPY (EGD);  Surgeon: Barrie Folk, MD;  Location: [facility] ENDOSCOPY;  Service: Endoscopy;  Laterality: N/A;    REVIEW OF SYSTEMS:   General: fatigue (+), night sweats (-), fever (-), pain (-) Lymph: palpable nodes (-) HEENT: vision changes (-), mucositis (-), gum bleeding (-), epistaxis (-) Cardiovascular: chest pain (-), palpitations (-) Pulmonary: shortness of breath (-), dyspnea on exertion (-), cough (-), hemoptysis (-) GI:  Early satiety (-), melena (-), dysphagia (-), nausea/vomiting (-), diarrhea (-) GU: dysuria (-), hematuria (-), incontinence (-) Musculoskeletal: joint swelling (-), joint pain (-), back pain (-) Neuro: weakness (-), numbness (-), headache (-), confusion (-) Skin: Rash (-), lesions (-), dryness (-) Psych: depression (-), suicidal/homicidal ideation (-), feeling of hopelessness (-)   PHYSICAL EXAMINATION: BP 155/74  Pulse 67  Temp 97.5 F (36.4 C) (Oral)  Resp 20  Ht 5\' 4"  (1.626 m)  Wt 98 lb 3.2 oz (44.543 kg)  BMI 16.86 kg/m2 General: Patient is a well appearing female in no acute distress HEENT: PERRLA, sclerae anicteric no conjunctival pallor, MMM Neck: supple, no palpable adenopathy Lungs: clear to auscultation bilaterally, no wheezes, rhonchi, or rales Cardiovascular: regular rate rhythm, S1, S2, no murmurs, rubs or gallops Abdomen: Soft, non-tender, non-distended, normoactive bowel sounds, no HSM Extremities: warm and well perfused, no clubbing, cyanosis, or edema Skin: No rashes or lesions Neuro: Non-focal ECOG PERFORMANCE STATUS: 2 - Symptomatic, <50% confined to bed  LABORATORY DATA: Lab Results  Component  Value Date   WBC 0.9* 03/09/2012   HGB 9.9* 03/09/2012   HCT 30.2* 03/09/2012   MCV 88.3 03/09/2012   PLT 18 Platelet count confirmed by slide estimate* 03/09/2012      Chemistry      Component Value Date/Time   NA 133* 03/09/2012 1052   NA 138 12/18/2011 1018   K 5.6 No visable hemolysis* 03/09/2012 1052   K 4.7 12/18/2011 1018   CL 100 03/09/2012 1052   CL 103 12/18/2011 1018   CO2 29 03/09/2012 1052   CO2 30 12/18/2011 1018   BUN 24.0 03/09/2012 1052   BUN 19 12/18/2011 1018   CREATININE 0.8 03/09/2012 1052   CREATININE 0.70 12/18/2011 1018   CREATININE 0.69 07/18/2010 1202   GLU  215* 02/26/2012 1255      Component Value Date/Time   CALCIUM 9.4 03/09/2012 1052   CALCIUM 9.5 12/18/2011 1018   ALKPHOS 38* 03/09/2012 1052   ALKPHOS 39 12/18/2011 1018   AST 21 03/09/2012 1052   AST 22 12/18/2011 1018   ALT 36 03/09/2012 1052   ALT 26 12/18/2011 1018   BILITOT 0.69 03/09/2012 1052   BILITOT 0.8 12/18/2011 1018       RADIOGRAPHIC STUDIES:  No results found.  ASSESSMENT: 78 year old female with:  1.  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  2. 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 This is discussed with the daughter and the patient today recommendations were made as below   #3 patient and I and her daughters had an extensive discussion today about the likely transformation to acute leukemia. We discussed the process of chemotherapy, risks, benefits, and potential adverse effects of the drug.  Through much conversation between myself, the patient, and her children, the patient has decided to proceed with Decitabine chemotherapy.  #4 Neutropenia and thrombocytopenia  #5 Hyperkalemia  #6 Mucositis  PLAN:  #1Ms. Cockcroft is doing well two weeks post treatment.  She is neutropenic and thrombocytopenic.  I have given her detailed neutropenic  precautions and called in Cipro for her to take.  Her platelet count is 18k, she will return tomorrow for platelets.    #2 I called in Kayexalate for her to take x 2 doses tonight.  She will take these and we will recheck her potassium tomorrow when she comes back for platelets.    #3 We had a long discussion about oral care and hygiene.  She will use salt water rinses after every meal and at bedtime, and will use biotene toothpaste and mouthrinse from now on.    #4  I will follow up on Ms. Wainright's potassium tomorrow and will see her back next week for labs and an appointment.    All questions were answered. The patient knows to call the clinic with any problems, questions or concerns. We can certainly see the patient much sooner if necessary.  I spent 25 minutes counseling the patient face to face. The total time spent in the appointment was 30 minutes.  This case was reviewed with Dr. Welton Flakes.    Cherie Ouch Lyn Hollingshead, NP Medical Oncology [facility] Phone: [phone]  03/09/2012, 1:04 PM

## 2012-03-09 NOTE — Patient Instructions (Addendum)
  Patient Neutropenia Instruction Sheet  Diagnosis: Leukemia     Treating Physician: Drue Second, MD  Treatment: 1. Type of chemotherapy: Decitabine 2. Date of last treatment: 10/28  Last Blood Counts: Lab Results  Component Value Date   WBC 0.9* 03/09/2012   HGB 9.9* 03/09/2012   HCT 30.2* 03/09/2012   MCV 88.3 03/09/2012   PLT 18 Platelet count confirmed by slide estimate* 03/09/2012   ANC 100      Prophylactic Antibiotics: Cipro 500 mg by mouth twice a day Instructions: 1. Monitor temperature and call if fever  greater than 100.5, chills, shaking chills (rigors) 2. Call Physician on-call at [phone] 3. Give him/her symptoms and list of medications that you are taking and your last blood count.   Take the Kayexalate for the high potassium level.  Only take two doses of this four hours apart.  We will re-check your potassium on Friday.    Continue using your insulin sliding scale.    Please call us if you have any questions or concerns.

## 2012-03-10 ENCOUNTER — Ambulatory Visit (HOSPITAL_BASED_OUTPATIENT_CLINIC_OR_DEPARTMENT_OTHER): Payer: Medicare Other

## 2012-03-10 ENCOUNTER — Telehealth: Payer: Self-pay | Admitting: *Deleted

## 2012-03-10 ENCOUNTER — Other Ambulatory Visit (HOSPITAL_BASED_OUTPATIENT_CLINIC_OR_DEPARTMENT_OTHER): Payer: Medicare Other | Admitting: Lab

## 2012-03-10 VITALS — BP 145/57 | HR 67 | Temp 97.0°F | Resp 20

## 2012-03-10 DIAGNOSIS — E875 Hyperkalemia: Secondary | ICD-10-CM

## 2012-03-10 DIAGNOSIS — D649 Anemia, unspecified: Secondary | ICD-10-CM

## 2012-03-10 DIAGNOSIS — C9200 Acute myeloblastic leukemia, not having achieved remission: Secondary | ICD-10-CM

## 2012-03-10 DIAGNOSIS — D469 Myelodysplastic syndrome, unspecified: Secondary | ICD-10-CM

## 2012-03-10 DIAGNOSIS — D696 Thrombocytopenia, unspecified: Secondary | ICD-10-CM

## 2012-03-10 LAB — BASIC METABOLIC PANEL (CC13)
Calcium: 8.9 mg/dL (ref 8.4–10.4)
Glucose: 177 mg/dl — ABNORMAL HIGH (ref 70–99)
Potassium: 3.9 mEq/L (ref 3.5–5.1)
Sodium: 136 mEq/L (ref 136–145)

## 2012-03-10 MED ORDER — ACETAMINOPHEN 325 MG PO TABS
650.0000 mg | ORAL_TABLET | Freq: Once | ORAL | Status: AC
  Administered 2012-03-10: 650 mg via ORAL

## 2012-03-10 MED ORDER — SODIUM CHLORIDE 0.9 % IV SOLN
250.0000 mL | Freq: Once | INTRAVENOUS | Status: AC
  Administered 2012-03-10: 250 mL via INTRAVENOUS

## 2012-03-10 MED ORDER — HEPARIN SOD (PORK) LOCK FLUSH 100 UNIT/ML IV SOLN
500.0000 [IU] | Freq: Once | INTRAVENOUS | Status: AC
  Administered 2012-03-10: 500 [IU] via INTRAVENOUS
  Filled 2012-03-10: qty 5

## 2012-03-10 MED ORDER — SODIUM CHLORIDE 0.9 % IJ SOLN
10.0000 mL | INTRAMUSCULAR | Status: DC | PRN
  Administered 2012-03-10: 10 mL via INTRAVENOUS
  Filled 2012-03-10: qty 10

## 2012-03-10 MED ORDER — DIPHENHYDRAMINE HCL 25 MG PO CAPS
25.0000 mg | ORAL_CAPSULE | Freq: Once | ORAL | Status: AC
  Administered 2012-03-10: 25 mg via ORAL

## 2012-03-10 NOTE — Telephone Encounter (Signed)
Per NP, notified pt potassium level is normal. Pt verbalized understanding. No further questions

## 2012-03-10 NOTE — Telephone Encounter (Signed)
Message copied by Cooper Render on Tue Mar 10, 2012  1:03 PM ------      Message from: Laural Golden      Created: Tue Mar 10, 2012 12:56 PM       Will you call patient and tell her that her potassium is normal?            Thanks!            L      ----- Message -----         From: [facility] Interface         Sent: 03/10/2012  10:50 AM           To: Augustin Schooling, NP

## 2012-03-10 NOTE — Patient Instructions (Signed)
Patient aware of next appointment; discharged home with no complaints. 

## 2012-03-11 ENCOUNTER — Ambulatory Visit: Payer: Medicare Other

## 2012-03-11 LAB — PREPARE PLATELET PHERESIS: Unit division: 0

## 2012-03-12 NOTE — Progress Notes (Signed)
OFFICE PROGRESS NOTE  CC  [name], MD [address] [place] [address]  DIAGNOSIS: 78 year old female with severe and profound anemia likely due to a primary bone marrow problem. 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  #2 patient has been on Procrit every 2 weeks unfortunately this has not effective and we will discontinue this.  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  CURRENT THERAPY:S/P Decitabine cycle 1   INTERVAL HISTORY: Meredith Rodriguez 78 y.o. female returns for follow up today.  She is feeling well.   she decided to undergo treatment with Decitabine for her AML.  t.  She is essentially w/o complaints,  MEDICAL HISTORY: Past Medical History  Diagnosis Date  . Hypertension   . Diabetes mellitus   . Hyperkalemia   . IBS (irritable bowel syndrome)   . Hypercholesterolemia   . Cervical radiculopathy   . Anemia     ALLERGIES:  is allergic to amoxicillin; cephalexin; metformin and related; phenergan; and procardia.  MEDICATIONS:  Current Outpatient Prescriptions  Medication Sig Dispense Refill  . amLODipine (NORVASC) 5 MG tablet Take 2.5 mg by mouth every morning.       Marland Kitchen aspirin 81 MG tablet Take 81 mg by mouth at bedtime.       . calcium gluconate 500 MG tablet Take 500 mg by mouth daily.      Marland Kitchen dexamethasone (DECADRON) 4 MG tablet Take 2 tablets (8 mg total) by mouth 2 (two) times daily with a meal. Take daily starting the day after chemotherapy for 2 days. Take with food.  30 tablet  1  . insulin lispro (HUMALOG KWIKPEN) 100 UNIT/ML injection Inject 0-6 Units into the skin 3 (three) times daily before meals.  3 mL  12  . Insulin Pen Needle (B-D UF III MINI PEN NEEDLES) 31G X 5 MM MISC 100 Units by Does not apply route 3 (three) times daily.  100 each  0  . lidocaine-prilocaine  (EMLA) cream Apply topically as needed.  30 g  4  . metoprolol (LOPRESSOR) 50 MG tablet Take 50 mg by mouth 2 (two) times daily.      . Omega-3 Fatty Acids (FISH OIL) 1000 MG CAPS Take 1,000 mg by mouth daily.       . ondansetron (ZOFRAN) 8 MG tablet Take 1 tablet (8 mg total) by mouth 2 (two) times daily. Take two times a day starting the day after chemo for 2 days. Then take two times a day as needed for nausea or vomiting.  30 tablet  1  . prochlorperazine (COMPAZINE) 10 MG tablet Take 1 tablet (10 mg total) by mouth every 6 (six) hours as needed (Nausea or vomiting).  30 tablet  1  . simvastatin (ZOCOR) 5 MG tablet Take 5 mg by mouth at bedtime.      . sulfamethoxazole-trimethoprim (BACTRIM DS,SEPTRA DS) 800-160 MG per tablet Take 1 tablet by mouth 2 (two) times daily.  10 tablet  0  . ciprofloxacin (CIPRO) 500 MG tablet Take 1 tablet (500 mg total) by mouth 2 (two) times daily.  14 tablet  6  . conjugated estrogens (PREMARIN) vaginal cream Place 0.5 g vaginally once a week. Sunday.      Marland Kitchen FREESTYLE LITE test strip TEST SUGAR TWICE A DAY AS INSTRUCTED  100 each  11  . LORazepam (  ATIVAN) 0.5 MG tablet Take 0.5 mg by mouth every 6 (six) hours as needed. Nausea      . prochlorperazine (COMPAZINE) 25 MG suppository Place 1 suppository (25 mg total) rectally every 12 (twelve) hours as needed for nausea.  12 suppository  3  . sodium polystyrene (SPS) 15 GM/60ML suspension Take 60 mLs (15 g total) by mouth every 4 (four) hours.  500 mL  0  . [DISCONTINUED] glimepiride (AMARYL) 2 MG tablet Take 1 tablet (2 mg total) by mouth every morning.  30 tablet  11   Current Facility-Administered Medications  Medication Dose Route Frequency Provider Last Rate Last Dose  . TDaP (BOOSTRIX) injection 0.5 mL  0.5 mL Intramuscular Once [name], MD        SURGICAL HISTORY:  Past Surgical History  Procedure Date  . Varicose veins     surgey 1959  . Tonsilectomy, adenoidectomy, bilateral myringotomy and  tubes   . Esophagogastroduodenoscopy 04/01/2011    Procedure: ESOPHAGOGASTRODUODENOSCOPY (EGD);  Surgeon: Barrie Folk, MD;  Location: [facility] ENDOSCOPY;  Service: Endoscopy;  Laterality: N/A;    REVIEW OF SYSTEMS:   General: fatigue (+), night sweats (-), fever (-), pain (-) Lymph: palpable nodes (-) HEENT: vision changes (-), mucositis (-), gum bleeding (-), epistaxis (-) Cardiovascular: chest pain (-), palpitations (-) Pulmonary: shortness of breath (-), dyspnea on exertion (-), cough (-), hemoptysis (-) GI:  Early satiety (-), melena (-), dysphagia (-), nausea/vomiting (-), diarrhea (-) GU: dysuria (-), hematuria (-), incontinence (-) Musculoskeletal: joint swelling (-), joint pain (-), back pain (-) Neuro: weakness (-), numbness (-), headache (-), confusion (-) Skin: Rash (-), lesions (-), dryness (-) Psych: depression (-), suicidal/homicidal ideation (-), feeling of hopelessness (-)   PHYSICAL EXAMINATION: BP 167/68  Pulse 66  Temp 97.8 F (36.6 C) (Oral)  Resp 20  Ht 5\' 4"  (1.626 m)  Wt 96 lb 8 oz (43.772 kg)  BMI 16.56 kg/m2 General: Patient is a well appearing female in no acute distress HEENT: PERRLA, sclerae anicteric no conjunctival pallor, MMM Neck: supple, no palpable adenopathy Lungs: clear to auscultation bilaterally, no wheezes, rhonchi, or rales Cardiovascular: regular rate rhythm, S1, S2, no murmurs, rubs or gallops Abdomen: Soft, non-tender, non-distended, normoactive bowel sounds, no HSM Extremities: warm and well perfused, no clubbing, cyanosis, or edema Skin: No rashes or lesions Neuro: Non-focal ECOG PERFORMANCE STATUS: 2 - Symptomatic, <50% confined to bed  LABORATORY DATA: Lab Results  Component Value Date   WBC 0.9* 03/09/2012   HGB 9.9* 03/09/2012   HCT 30.2* 03/09/2012   MCV 88.3 03/09/2012   PLT 18 Platelet count confirmed by slide estimate* 03/09/2012      Chemistry      Component Value Date/Time   NA 136 03/10/2012 0856   NA 138 12/18/2011  1018   K 3.9 03/10/2012 0856   K 4.7 12/18/2011 1018   CL 102 03/10/2012 0856   CL 103 12/18/2011 1018   CO2 28 03/10/2012 0856   CO2 30 12/18/2011 1018   BUN 19.0 03/10/2012 0856   BUN 19 12/18/2011 1018   CREATININE 0.7 03/10/2012 0856   CREATININE 0.70 12/18/2011 1018   CREATININE 0.69 07/18/2010 1202   GLU 215* 02/26/2012 1255      Component Value Date/Time   CALCIUM 8.9 03/10/2012 0856   CALCIUM 9.5 12/18/2011 1018   ALKPHOS 38* 03/09/2012 1052   ALKPHOS 39 12/18/2011 1018   AST 21 03/09/2012 1052   AST 22 12/18/2011 1018   ALT  36 03/09/2012 1052   ALT 26 12/18/2011 1018   BILITOT 0.69 03/09/2012 1052   BILITOT 0.8 12/18/2011 1018       RADIOGRAPHIC STUDIES:  No results found.  ASSESSMENT: 78 year old female with:  1.  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  2. 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 This is discussed with the daughter and the patient today recommendations were made as below   #3 patient is status post cycle 1 days one through 5 of decitabine tolerating it well. Her blood count looks pretty good today. Overall she feels well. We discussed her CBC today  PLAN:  #1 patient is now status post cycle 1 of decitabine. Overall she tolerated it well.  #2 she will return in one week's time for followup.  All questions were answered. The patient knows to call the clinic with any problems, questions or concerns. We can certainly see the patient much sooner if necessary.  I spent 25 minutes counseling the patient face to face. The total time spent in the appointment was 30 minutes. Drue Second, MD Medical/Oncology [facility] [phone] (beeper) [phone] (Office)

## 2012-03-16 ENCOUNTER — Ambulatory Visit (HOSPITAL_BASED_OUTPATIENT_CLINIC_OR_DEPARTMENT_OTHER): Payer: Medicare Other | Admitting: Adult Health

## 2012-03-16 ENCOUNTER — Encounter: Payer: Self-pay | Admitting: Adult Health

## 2012-03-16 ENCOUNTER — Other Ambulatory Visit (HOSPITAL_BASED_OUTPATIENT_CLINIC_OR_DEPARTMENT_OTHER): Payer: Medicare Other | Admitting: Lab

## 2012-03-16 VITALS — BP 129/59 | HR 73 | Temp 97.8°F | Resp 18 | Ht 64.0 in | Wt 101.8 lb

## 2012-03-16 DIAGNOSIS — K1230 Oral mucositis (ulcerative), unspecified: Secondary | ICD-10-CM

## 2012-03-16 DIAGNOSIS — D709 Neutropenia, unspecified: Secondary | ICD-10-CM

## 2012-03-16 DIAGNOSIS — C9200 Acute myeloblastic leukemia, not having achieved remission: Secondary | ICD-10-CM

## 2012-03-16 DIAGNOSIS — C9500 Acute leukemia of unspecified cell type not having achieved remission: Secondary | ICD-10-CM

## 2012-03-16 DIAGNOSIS — D649 Anemia, unspecified: Secondary | ICD-10-CM

## 2012-03-16 DIAGNOSIS — D4620 Refractory anemia with excess of blasts, unspecified: Secondary | ICD-10-CM

## 2012-03-16 LAB — CBC WITH DIFFERENTIAL/PLATELET
BASO%: 1 % (ref 0.0–2.0)
EOS%: 0.5 % (ref 0.0–7.0)
LYMPH%: 40.3 % (ref 14.0–49.7)
MCHC: 33.7 g/dL (ref 31.5–36.0)
MCV: 89.6 fL (ref 79.5–101.0)
MONO%: 0.4 % (ref 0.0–14.0)
Platelets: 87 10*3/uL — ABNORMAL LOW (ref 145–400)
RBC: 2.72 10*6/uL — ABNORMAL LOW (ref 3.70–5.45)

## 2012-03-16 LAB — COMPREHENSIVE METABOLIC PANEL (CC13)
ALT: 34 U/L (ref 0–55)
Alkaline Phosphatase: 47 U/L (ref 40–150)
Sodium: 136 mEq/L (ref 136–145)
Total Bilirubin: 0.76 mg/dL (ref 0.20–1.20)
Total Protein: 5.8 g/dL — ABNORMAL LOW (ref 6.4–8.3)

## 2012-03-16 MED ORDER — MAGIC MOUTHWASH W/LIDOCAINE: 5.0000 mL | Freq: Four times a day (QID) | ORAL | Status: DC | PRN

## 2012-03-16 NOTE — Patient Instructions (Signed)
Doing well.  Refill your cipro antibiotic and take for one more week.  Monitor your arm lesion.  Should it get worse, please call.  Take the magic mouthwash as prescribed.  If you feel short of breath, or more fatigued, give [place] a call and we will get you in for a transfusion.  Please call us if you have any questions or concerns.

## 2012-03-16 NOTE — Progress Notes (Signed)
OFFICE PROGRESS NOTE  CC  [name], MD [address] [place] [address]  DIAGNOSIS: 78 year old female with severe and profound anemia likely due to a primary bone marrow problem. 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  #2 patient has been on Procrit every 2 weeks unfortunately this has not effective and we will discontinue this.  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  CURRENT THERAPY: Decitabine cycle 1 day 21  INTERVAL HISTORY: Meredith Rodriguez 78 y.o. female returns for follow up today.  She continues to do well.  She has had no fevers or chills and has tolerated her Cipro without any difficulty.  She has a red lesion on her right forearm and continues to have ulcers in her mouth that are slightly improved since initiating biotene toothpaste and mouthrinse.  She's slightly fatigued and nauseated, but does not feel anemic as she has in the past.  Otherwise, she's without questions and concerns.    MEDICAL HISTORY: Past Medical History  Diagnosis Date  . Hypertension   . Diabetes mellitus   . Hyperkalemia   . IBS (irritable bowel syndrome)   . Hypercholesterolemia   . Cervical radiculopathy   . Anemia     ALLERGIES:  is allergic to amoxicillin; cephalexin; metformin and related; phenergan; and procardia.  MEDICATIONS:  Current Outpatient Prescriptions  Medication Sig Dispense Refill  . amLODipine (NORVASC) 5 MG tablet Take 2.5 mg by mouth every morning.       Marland Kitchen aspirin 81 MG tablet Take 81 mg by mouth at bedtime.       . calcium gluconate 500 MG tablet Take 500 mg by mouth daily.      . ciprofloxacin (CIPRO) 500 MG tablet Take 1 tablet (500 mg total) by mouth 2 (two) times daily.  14 tablet  6  . conjugated estrogens (PREMARIN) vaginal cream Place 0.5 g vaginally once a week. Sunday.       Marland Kitchen dexamethasone (DECADRON) 4 MG tablet Take 2 tablets (8 mg total) by mouth 2 (two) times daily with a meal. Take daily starting the day after chemotherapy for 2 days. Take with food.  30 tablet  1  . FREESTYLE LITE test strip TEST SUGAR TWICE A DAY AS INSTRUCTED  100 each  11  . insulin lispro (HUMALOG KWIKPEN) 100 UNIT/ML injection Inject 0-6 Units into the skin 3 (three) times daily before meals.  3 mL  12  . Insulin Pen Needle (B-D UF III MINI PEN NEEDLES) 31G X 5 MM MISC 100 Units by Does not apply route 3 (three) times daily.  100 each  0  . lidocaine-prilocaine (EMLA) cream Apply topically as needed.  30 g  4  . LORazepam (ATIVAN) 0.5 MG tablet Take 0.5 mg by mouth every 6 (six) hours as needed. Nausea      . metoprolol (LOPRESSOR) 50 MG tablet Take 50 mg by mouth 2 (two) times daily.      . Omega-3 Fatty Acids (FISH OIL) 1000 MG CAPS Take 1,000 mg by mouth daily.       . ondansetron (ZOFRAN) 8 MG tablet Take 1 tablet (8 mg total) by mouth 2 (two) times daily. Take two times a day starting the day after chemo for 2 days. Then take two times a day as needed for nausea or vomiting.  30 tablet  1  . prochlorperazine (COMPAZINE) 10 MG tablet Take 1 tablet (10 mg total) by mouth every 6 (six) hours as needed (Nausea or vomiting).  30 tablet  1  . prochlorperazine (COMPAZINE) 25 MG suppository Place 1 suppository (25 mg total) rectally every 12 (twelve) hours as needed for nausea.  12 suppository  3  . simvastatin (ZOCOR) 5 MG tablet Take 5 mg by mouth at bedtime.      . sodium polystyrene (SPS) 15 GM/60ML suspension Take 60 mLs (15 g total) by mouth every 4 (four) hours.  500 mL  0  . sulfamethoxazole-trimethoprim (BACTRIM DS,SEPTRA DS) 800-160 MG per tablet Take 1 tablet by mouth 2 (two) times daily.  10 tablet  0  . [DISCONTINUED] glimepiride (AMARYL) 2 MG tablet Take 1 tablet (2 mg total) by mouth every morning.  30 tablet  11   Current Facility-Administered Medications  Medication Dose  Route Frequency Provider Last Rate Last Dose  . TDaP (BOOSTRIX) injection 0.5 mL  0.5 mL Intramuscular Once [name], MD        SURGICAL HISTORY:  Past Surgical History  Procedure Date  . Varicose veins     surgey 1959  . Tonsilectomy, adenoidectomy, bilateral myringotomy and tubes   . Esophagogastroduodenoscopy 04/01/2011    Procedure: ESOPHAGOGASTRODUODENOSCOPY (EGD);  Surgeon: Barrie Folk, MD;  Location: [facility] ENDOSCOPY;  Service: Endoscopy;  Laterality: N/A;    REVIEW OF SYSTEMS:   General: fatigue (+), night sweats (-), fever (-), pain (-) Lymph: palpable nodes (-) HEENT: vision changes (-), mucositis (+), gum bleeding (-), epistaxis (-) Cardiovascular: chest pain (-), palpitations (-) Pulmonary: shortness of breath (-), dyspnea on exertion (-), cough (-), hemoptysis (-) GI:  Early satiety (-), melena (-), dysphagia (-), nausea/vomiting (-), diarrhea (-) GU: dysuria (-), hematuria (-), incontinence (-) Musculoskeletal: joint swelling (-), joint pain (-), back pain (-) Neuro: weakness (-), numbness (-), headache (-), confusion (-) Skin: Rash (-), lesions (-), dryness (-) Psych: depression (-), suicidal/homicidal ideation (-), feeling of hopelessness (-)   PHYSICAL EXAMINATION: BP 129/59  Pulse 73  Temp 97.8 F (36.6 C) (Oral)  Resp 18  Ht 5\' 4"  (1.626 m)  Wt 101 lb 12.8 oz (46.176 kg)  BMI 17.47 kg/m2 General: Patient is a well appearing female in no acute distress HEENT: PERRLA, sclerae anicteric no conjunctival pallor, left buccal and gum line breakdown. Neck: supple, no palpable adenopathy Lungs: clear to auscultation bilaterally, no wheezes, rhonchi, or rales Cardiovascular: regular rate rhythm, S1, S2, no murmurs, rubs or gallops Abdomen: Soft, non-tender, non-distended, normoactive bowel sounds, no HSM Extremities: warm and well perfused, no clubbing, cyanosis, or edema Skin: No rashes.  Right dime sized erythematous circular lesion with papular whitened  surface.   Neuro: Non-focal ECOG PERFORMANCE STATUS: 2 - Symptomatic, <50% confined to bed  LABORATORY DATA: Lab Results  Component Value Date   WBC 1.8* 03/16/2012   HGB 8.2* 03/16/2012   HCT 24.4* 03/16/2012   MCV 89.6 03/16/2012   PLT 87* 03/16/2012      Chemistry      Component Value Date/Time   NA 136 03/16/2012 1044   NA 138 12/18/2011 1018   K 4.2 03/16/2012 1044   K 4.7 12/18/2011 1018   CL 102 03/16/2012 1044   CL 103 12/18/2011 1018   CO2 30* 03/16/2012 1044   CO2 30 12/18/2011 1018   BUN 14.0 03/16/2012 1044   BUN 19 12/18/2011 1018   CREATININE 0.8 03/16/2012 1044   CREATININE  0.70 12/18/2011 1018   CREATININE 0.69 07/18/2010 1202   GLU 215* 02/26/2012 1255      Component Value Date/Time   CALCIUM 9.2 03/16/2012 1044   CALCIUM 9.5 12/18/2011 1018   ALKPHOS 47 03/16/2012 1044   ALKPHOS 39 12/18/2011 1018   AST 21 03/16/2012 1044   AST 22 12/18/2011 1018   ALT 34 03/16/2012 1044   ALT 26 12/18/2011 1018   BILITOT 0.76 03/16/2012 1044   BILITOT 0.8 12/18/2011 1018       RADIOGRAPHIC STUDIES:  No results found.  ASSESSMENT: 78 year old female with:  1.  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  2. 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 This is discussed with the daughter and the patient today recommendations were made as below   #3 patient and I and her daughters had an extensive discussion today about the likely transformation to acute leukemia. We discussed the process of chemotherapy, risks, benefits, and potential adverse effects of the drug.  Through much conversation between myself, the patient, and her children, the patient has decided to proceed with Decitabine chemotherapy.  #4 Neutropenia and thrombocytopenia  #5 Hyperkalemia  #6 Mucositis  PLAN:  #1 Ms. Casebeer is doing well 21 days after cycle 1 of decitabine.   She has tolerated it essentially well.  Since her anemia is not symptomatic, she would like to wait to receive blood.  She and I discussed signs and symptoms of anemia and when to call to come in.  Her ANC is 1.1, and I told her to refill her Cipro and continue it for a week due to her right arm lesion.  Should this lesion increase in size, we will send her to dermatology.  She will be back for a count re-check on 11/25.    #2 Her hyperkalemia is much improved after taking the kayexalate.    #3 Her mouth is slightly improved with biotene.  I have ordered magic mouthwash for her to take since she still has some oral lesions.    #4  We will see Ms. Krol back for counts on 11/25.    All questions were answered. The patient knows to call the clinic with any problems, questions or concerns. We can certainly see the patient much sooner if necessary.  I spent 25 minutes counseling the patient face to face. The total time spent in the appointment was 30 minutes.  This case was reviewed with Dr. Welton Flakes.    Cherie Ouch Lyn Hollingshead, NP Medical On[facility] Phone: [phone]  03/16/2012, 11:50 AM

## 2012-03-23 ENCOUNTER — Encounter (HOSPITAL_COMMUNITY)
Admission: RE | Admit: 2012-03-23 | Discharge: 2012-03-23 | Disposition: A | Discharge status: Home / Self Care | Payer: Medicare Other | Source: Ambulatory Visit | Attending: Oncology | Admitting: Oncology

## 2012-03-23 ENCOUNTER — Ambulatory Visit: Payer: Medicare Other | Admitting: Lab

## 2012-03-23 ENCOUNTER — Telehealth: Payer: Self-pay | Admitting: Oncology

## 2012-03-23 ENCOUNTER — Encounter: Payer: Self-pay | Admitting: Adult Health

## 2012-03-23 ENCOUNTER — Other Ambulatory Visit (HOSPITAL_BASED_OUTPATIENT_CLINIC_OR_DEPARTMENT_OTHER): Payer: Medicare Other | Admitting: Lab

## 2012-03-23 ENCOUNTER — Ambulatory Visit (HOSPITAL_BASED_OUTPATIENT_CLINIC_OR_DEPARTMENT_OTHER): Payer: Medicare Other | Admitting: Adult Health

## 2012-03-23 ENCOUNTER — Other Ambulatory Visit: Payer: Self-pay | Admitting: Emergency Medicine

## 2012-03-23 ENCOUNTER — Ambulatory Visit (HOSPITAL_BASED_OUTPATIENT_CLINIC_OR_DEPARTMENT_OTHER): Payer: Medicare Other

## 2012-03-23 VITALS — BP 137/65 | HR 77 | Temp 98.2°F | Resp 20 | Ht 64.0 in | Wt 103.5 lb

## 2012-03-23 VITALS — BP 155/66 | HR 76 | Temp 97.6°F | Resp 18

## 2012-03-23 DIAGNOSIS — D469 Myelodysplastic syndrome, unspecified: Secondary | ICD-10-CM

## 2012-03-23 DIAGNOSIS — D649 Anemia, unspecified: Secondary | ICD-10-CM

## 2012-03-23 DIAGNOSIS — C9200 Acute myeloblastic leukemia, not having achieved remission: Secondary | ICD-10-CM

## 2012-03-23 DIAGNOSIS — L989 Disorder of the skin and subcutaneous tissue, unspecified: Secondary | ICD-10-CM

## 2012-03-23 DIAGNOSIS — D4620 Refractory anemia with excess of blasts, unspecified: Secondary | ICD-10-CM

## 2012-03-23 DIAGNOSIS — K121 Other forms of stomatitis: Secondary | ICD-10-CM

## 2012-03-23 LAB — CBC WITH DIFFERENTIAL/PLATELET
Eosinophils Absolute: 0 10*3/uL (ref 0.0–0.5)
LYMPH%: 13.4 % — ABNORMAL LOW (ref 14.0–49.7)
MCV: 88.8 fL (ref 79.5–101.0)
MONO%: 0.2 % (ref 0.0–14.0)
NEUT#: 5.7 10*3/uL (ref 1.5–6.5)
Platelets: 132 10*3/uL — ABNORMAL LOW (ref 145–400)
RBC: 2.45 10*6/uL — ABNORMAL LOW (ref 3.70–5.45)

## 2012-03-23 LAB — COMPREHENSIVE METABOLIC PANEL (CC13)
Alkaline Phosphatase: 40 U/L (ref 40–150)
BUN: 16 mg/dL (ref 7.0–26.0)
Creatinine: 0.8 mg/dL (ref 0.6–1.1)
Glucose: 327 mg/dl — ABNORMAL HIGH (ref 70–99)
Sodium: 135 mEq/L — ABNORMAL LOW (ref 136–145)
Total Bilirubin: 0.87 mg/dL (ref 0.20–1.20)
Total Protein: 6.2 g/dL — ABNORMAL LOW (ref 6.4–8.3)

## 2012-03-23 MED ORDER — HEPARIN SOD (PORK) LOCK FLUSH 100 UNIT/ML IV SOLN
500.0000 [IU] | Freq: Every day | INTRAVENOUS | Status: AC | PRN
  Administered 2012-03-23: 500 [IU]
  Filled 2012-03-23: qty 5

## 2012-03-23 MED ORDER — SODIUM CHLORIDE 0.9 % IJ SOLN
10.0000 mL | INTRAMUSCULAR | Status: DC | PRN
  Filled 2012-03-23: qty 10

## 2012-03-23 MED ORDER — SODIUM CHLORIDE 0.9 % IJ SOLN
10.0000 mL | INTRAMUSCULAR | Status: AC | PRN
  Administered 2012-03-23: 10 mL
  Filled 2012-03-23: qty 10

## 2012-03-23 MED ORDER — SODIUM CHLORIDE 0.9 % IV SOLN
250.0000 mL | Freq: Once | INTRAVENOUS | Status: AC
  Administered 2012-03-23: 250 mL via INTRAVENOUS

## 2012-03-23 MED ORDER — ACETAMINOPHEN 325 MG PO TABS
650.0000 mg | ORAL_TABLET | Freq: Once | ORAL | Status: AC
  Administered 2012-03-23: 650 mg via ORAL

## 2012-03-23 MED ORDER — DIPHENHYDRAMINE HCL 25 MG PO CAPS
25.0000 mg | ORAL_CAPSULE | Freq: Once | ORAL | Status: AC
  Administered 2012-03-23: 25 mg via ORAL

## 2012-03-23 NOTE — Telephone Encounter (Signed)
lmonvm adviisng the pt's dtr to pick another dec 2013 appt calendar when they come into the office

## 2012-03-23 NOTE — Patient Instructions (Addendum)
Blood Transfusion Information WHAT IS A BLOOD TRANSFUSION? A transfusion is the replacement of blood or some of its parts. Blood is made up of multiple cells which provide different functions.  Red blood cells carry oxygen and are used for blood loss replacement.  White blood cells fight against infection.  Platelets control bleeding.  Plasma helps clot blood.  Other blood products are available for specialized needs, such as hemophilia or other clotting disorders. BEFORE THE TRANSFUSION  Who gives blood for transfusions?   You may be able to donate blood to be used at a later date on yourself (autologous donation).  Relatives can be asked to donate blood. This is generally not any safer than if you have received blood from a stranger. The same precautions are taken to ensure safety when a relative's blood is donated.  Healthy volunteers who are fully evaluated to make sure their blood is safe. This is blood bank blood. Transfusion therapy is the safest it has ever been in the practice of medicine. Before blood is taken from a donor, a complete history is taken to make sure that person has no history of diseases nor engages in risky social behavior (examples are intravenous drug use or sexual activity with multiple partners). The donor's travel history is screened to minimize risk of transmitting infections, such as malaria. The donated blood is tested for signs of infectious diseases, such as HIV and hepatitis. The blood is then tested to be sure it is compatible with you in order to minimize the chance of a transfusion reaction. If you or a relative donates blood, this is often done in anticipation of surgery and is not appropriate for emergency situations. It takes many days to process the donated blood. RISKS AND COMPLICATIONS Although transfusion therapy is very safe and saves many lives, the main dangers of transfusion include:   Getting an infectious disease.  Developing a  transfusion reaction. This is an allergic reaction to something in the blood you were given. Every precaution is taken to prevent this. The decision to have a blood transfusion has been considered carefully by your caregiver before blood is given. Blood is not given unless the benefits outweigh the risks. AFTER THE TRANSFUSION  Right after receiving a blood transfusion, you will usually feel much better and more energetic. This is especially true if your red blood cells have gotten low (anemic). The transfusion raises the level of the red blood cells which carry oxygen, and this usually causes an energy increase.  The nurse administering the transfusion will monitor you carefully for complications. HOME CARE INSTRUCTIONS  No special instructions are needed after a transfusion. You may find your energy is better. Speak with your caregiver about any limitations on activity for underlying diseases you may have. SEEK MEDICAL CARE IF:   Your condition is not improving after your transfusion.  You develop redness or irritation at the intravenous (IV) site. SEEK IMMEDIATE MEDICAL CARE IF:  Any of the following symptoms occur over the next 12 hours:  Shaking chills.  You have a temperature by mouth above 102 F (38.9 C), not controlled by medicine.  Chest, back, or muscle pain.  People around you feel you are not acting correctly or are confused.  Shortness of breath or difficulty breathing.  Dizziness and fainting.  You get a rash or develop hives.  You have a decrease in urine output.  Your urine turns a dark color or changes to pink, red, or brown. Any of the following   symptoms occur over the next 10 days:  You have a temperature by mouth above 102 F (38.9 C), not controlled by medicine.  Shortness of breath.  Weakness after normal activity.  The white part of the eye turns yellow (jaundice).  You have a decrease in the amount of urine or are urinating less often.  Your  urine turns a dark color or changes to pink, red, or brown. Document Released: 04/12/2000 Document Revised: 07/08/2011 Document Reviewed: 11/30/2007 Exit[employer] Patient Information 2013 [employer].  

## 2012-03-23 NOTE — Patient Instructions (Addendum)
Doing well.  We will add you on for blood today.  Please call us if you have any questions or concerns.

## 2012-03-23 NOTE — Progress Notes (Signed)
OFFICE PROGRESS NOTE  CC  Meredith Covey, MD [address] [place] [address]  DIAGNOSIS: 78 year old female with severe and profound anemia likely due to a primary bone marrow problem. 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  #2 patient has been on Procrit every 2 weeks unfortunately this has not effective and we will discontinue this.  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  CURRENT THERAPY: Decitabine cycle 1 day 28  INTERVAL HISTORY: Meredith Rodriguez 78 y.o. female returns for follow up today. She's very tired.  Her mouth is improving.  She continues to have a lesion on her right arm.  She feels like its drying out, and hasn't developed any lesions anywhere else.  Otherwise, she's without any question or concern.    MEDICAL HISTORY: Past Medical History  Diagnosis Date  . Hypertension   . Diabetes mellitus   . Hyperkalemia   . IBS (irritable bowel syndrome)   . Hypercholesterolemia   . Cervical radiculopathy   . Anemia     ALLERGIES:  is allergic to amoxicillin; cephalexin; metformin and related; phenergan; and procardia.  MEDICATIONS:  Current Outpatient Prescriptions  Medication Sig Dispense Refill  . Alum & Mag Hydroxide-Simeth (MAGIC MOUTHWASH W/LIDOCAINE) SOLN Take 5 mLs by mouth 4 (four) times daily as needed.  100 mL  0  . amLODipine (NORVASC) 5 MG tablet Take 2.5 mg by mouth every morning.       Marland Kitchen aspirin 81 MG tablet Take 81 mg by mouth at bedtime.       . calcium gluconate 500 MG tablet Take 500 mg by mouth daily.      . ciprofloxacin (CIPRO) 500 MG tablet Take 1 tablet (500 mg total) by mouth 2 (two) times daily.  14 tablet  6  . conjugated estrogens (PREMARIN) vaginal cream Place 0.5 g vaginally once a week. Sunday.      Marland Kitchen dexamethasone (DECADRON) 4 MG tablet Take 2 tablets  (8 mg total) by mouth 2 (two) times daily with a meal. Take daily starting the day after chemotherapy for 2 days. Take with food.  30 tablet  1  . FREESTYLE LITE test strip TEST SUGAR TWICE A DAY AS INSTRUCTED  100 each  11  . insulin lispro (HUMALOG KWIKPEN) 100 UNIT/ML injection Inject 0-6 Units into the skin 3 (three) times daily before meals.  3 mL  12  . Insulin Pen Needle (B-D UF III MINI PEN NEEDLES) 31G X 5 MM MISC 100 Units by Does not apply route 3 (three) times daily.  100 each  0  . lidocaine-prilocaine (EMLA) cream Apply topically as needed.  30 g  4  . LORazepam (ATIVAN) 0.5 MG tablet Take 0.5 mg by mouth every 6 (six) hours as needed. Nausea      . metoprolol (LOPRESSOR) 50 MG tablet Take 50 mg by mouth 2 (two) times daily.      . Omega-3 Fatty Acids (FISH OIL) 1000 MG CAPS Take 1,000 mg by mouth daily.       . ondansetron (ZOFRAN) 8 MG tablet Take 1 tablet (8 mg total) by mouth 2 (two) times daily. Take two times a day starting the day after chemo for 2 days. Then take two times a day as needed for nausea or vomiting.  30 tablet  1  . prochlorperazine (COMPAZINE) 10 MG  tablet Take 1 tablet (10 mg total) by mouth every 6 (six) hours as needed (Nausea or vomiting).  30 tablet  1  . prochlorperazine (COMPAZINE) 25 MG suppository Place 1 suppository (25 mg total) rectally every 12 (twelve) hours as needed for nausea.  12 suppository  3  . simvastatin (ZOCOR) 5 MG tablet Take 5 mg by mouth at bedtime.      . sodium polystyrene (SPS) 15 GM/60ML suspension Take 60 mLs (15 g total) by mouth every 4 (four) hours.  500 mL  0  . sulfamethoxazole-trimethoprim (BACTRIM DS,SEPTRA DS) 800-160 MG per tablet Take 1 tablet by mouth 2 (two) times daily.  10 tablet  0  . [DISCONTINUED] glimepiride (AMARYL) 2 MG tablet Take 1 tablet (2 mg total) by mouth every morning.  30 tablet  11   Current Facility-Administered Medications  Medication Dose Route Frequency Provider Last Rate Last Dose  . TDaP  (BOOSTRIX) injection 0.5 mL  0.5 mL Intramuscular Once Meredith Covey, MD        SURGICAL HISTORY:  Past Surgical History  Procedure Date  . Varicose veins     surgey 1959  . Tonsilectomy, adenoidectomy, bilateral myringotomy and tubes   . Esophagogastroduodenoscopy 04/01/2011    Procedure: ESOPHAGOGASTRODUODENOSCOPY (EGD);  Surgeon: Barrie Folk, MD;  Location: [facility] ENDOSCOPY;  Service: Endoscopy;  Laterality: N/A;    REVIEW OF SYSTEMS:   General: fatigue (+), night sweats (-), fever (-), pain (-) Lymph: palpable nodes (-) HEENT: vision changes (-), mucositis (+), gum bleeding (-), epistaxis (-) Cardiovascular: chest pain (-), palpitations (-) Pulmonary: shortness of breath (-), dyspnea on exertion (-), cough (-), hemoptysis (-) GI:  Early satiety (-), melena (-), dysphagia (-), nausea/vomiting (-), diarrhea (-) GU: dysuria (-), hematuria (-), incontinence (-) Musculoskeletal: joint swelling (-), joint pain (-), back pain (-) Neuro: weakness (-), numbness (-), headache (-), confusion (-) Skin: Rash (-), lesions (-), dryness (-) Psych: depression (-), suicidal/homicidal ideation (-), feeling of hopelessness (-)   PHYSICAL EXAMINATION: BP 137/65  Pulse 77  Temp 98.2 F (36.8 C) (Oral)  Resp 20  Ht 5\' 4"  (1.626 m)  Wt 103 lb 8 oz (46.947 kg)  BMI 17.77 kg/m2 General: Patient is a well appearing female in no acute distress HEENT: PERRLA, sclerae anicteric no conjunctival pallor, left buccal and gum line breakdown. Neck: supple, no palpable adenopathy Lungs: clear to auscultation bilaterally, no wheezes, rhonchi, or rales Cardiovascular: regular rate rhythm, S1, S2, no murmurs, rubs or gallops Abdomen: Soft, non-tender, non-distended, normoactive bowel sounds, no HSM Extremities: warm and well perfused, no clubbing, cyanosis, or edema Skin: No rashes.  Right dime sized erythematous circular lesion with papular whitened surface.   Neuro: Non-focal ECOG PERFORMANCE STATUS: 2 -  Symptomatic, <50% confined to bed  LABORATORY DATA: Lab Results  Component Value Date   WBC 6.6 03/23/2012   HGB 7.1* 03/23/2012   HCT 21.8* 03/23/2012   MCV 88.8 03/23/2012   PLT 132* 03/23/2012      Chemistry      Component Value Date/Time   NA 135* 03/23/2012 1047   NA 138 12/18/2011 1018   K 4.1 03/23/2012 1047   K 4.7 12/18/2011 1018   CL 100 03/23/2012 1047   CL 103 12/18/2011 1018   CO2 28 03/23/2012 1047   CO2 30 12/18/2011 1018   BUN 16.0 03/23/2012 1047   BUN 19 12/18/2011 1018   CREATININE 0.8 03/23/2012 1047   CREATININE 0.70 12/18/2011 1018   CREATININE 0.69  07/18/2010 1202   GLU 215* 02/26/2012 1255      Component Value Date/Time   CALCIUM 8.8 03/23/2012 1047   CALCIUM 9.5 12/18/2011 1018   ALKPHOS 40 03/23/2012 1047   ALKPHOS 39 12/18/2011 1018   AST 17 03/23/2012 1047   AST 22 12/18/2011 1018   ALT 19 03/23/2012 1047   ALT 26 12/18/2011 1018   BILITOT 0.87 03/23/2012 1047   BILITOT 0.8 12/18/2011 1018       RADIOGRAPHIC STUDIES:  No results found.  ASSESSMENT: 78 year old female with:  1.  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  2. 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 This is discussed with the daughter and the patient today recommendations were made as below   #3 patient and I and her daughters had an extensive discussion today about the likely transformation to acute leukemia. We discussed the process of chemotherapy, risks, benefits, and potential adverse effects of the drug.  Through much conversation between myself, the patient, and her children, the patient has decided to proceed with Decitabine chemotherapy.  #4 Mucositis  #5 Skin lesion  PLAN:  #1 Ms. Burkman is doing well 28 days after cycle 1 of decitabine.  She has tolerated it essentially well.  Her anemia is worse today.  WE will transfuse her with 2  units of blood.    #2 Her mouth is much improved.  #3 We will keep an eye on the right arm lesion.  If its still a concern next week we will get a biopsy.   #4  We will see Ms. Averitt back for counts and cycle 2 on 03/30/12.    All questions were answered. The patient knows to call the clinic with any problems, questions or concerns. We can certainly see the patient much sooner if necessary.  I spent 25 minutes counseling the patient face to face. The total time spent in the appointment was 30 minutes.  This case was reviewed with Dr. Welton Flakes.    Meredith Ouch Lyn Hollingshead, NP Medical Oncology [facility] Phone: [phone] 03/23/2012, 11:35 AM

## 2012-03-24 LAB — TYPE AND SCREEN
ABO/RH(D): AB NEG
Donor AG Type: NEGATIVE

## 2012-03-25 ENCOUNTER — Ambulatory Visit: Payer: Medicare Other

## 2012-03-30 ENCOUNTER — Ambulatory Visit (HOSPITAL_BASED_OUTPATIENT_CLINIC_OR_DEPARTMENT_OTHER): Payer: Medicare Other | Admitting: Adult Health

## 2012-03-30 ENCOUNTER — Ambulatory Visit (HOSPITAL_BASED_OUTPATIENT_CLINIC_OR_DEPARTMENT_OTHER): Payer: Medicare Other

## 2012-03-30 ENCOUNTER — Telehealth: Payer: Self-pay | Admitting: *Deleted

## 2012-03-30 ENCOUNTER — Encounter: Payer: Self-pay | Admitting: Adult Health

## 2012-03-30 ENCOUNTER — Other Ambulatory Visit (HOSPITAL_BASED_OUTPATIENT_CLINIC_OR_DEPARTMENT_OTHER): Payer: Medicare Other | Admitting: Lab

## 2012-03-30 ENCOUNTER — Telehealth: Payer: Self-pay | Admitting: Oncology

## 2012-03-30 VITALS — BP 133/70 | HR 73 | Temp 98.2°F | Resp 20 | Ht 64.0 in | Wt 99.5 lb

## 2012-03-30 DIAGNOSIS — D649 Anemia, unspecified: Secondary | ICD-10-CM

## 2012-03-30 DIAGNOSIS — C9500 Acute leukemia of unspecified cell type not having achieved remission: Secondary | ICD-10-CM

## 2012-03-30 DIAGNOSIS — C9200 Acute myeloblastic leukemia, not having achieved remission: Secondary | ICD-10-CM

## 2012-03-30 DIAGNOSIS — E119 Type 2 diabetes mellitus without complications: Secondary | ICD-10-CM

## 2012-03-30 DIAGNOSIS — L989 Disorder of the skin and subcutaneous tissue, unspecified: Secondary | ICD-10-CM

## 2012-03-30 DIAGNOSIS — I889 Nonspecific lymphadenitis, unspecified: Secondary | ICD-10-CM

## 2012-03-30 DIAGNOSIS — Z5111 Encounter for antineoplastic chemotherapy: Secondary | ICD-10-CM

## 2012-03-30 LAB — CBC WITH DIFFERENTIAL/PLATELET
BASO%: 0.2 % (ref 0.0–2.0)
EOS%: 0.3 % (ref 0.0–7.0)
HCT: 34 % — ABNORMAL LOW (ref 34.8–46.6)
LYMPH%: 19.6 % (ref 14.0–49.7)
MCH: 28.7 pg (ref 25.1–34.0)
MCHC: 32.6 g/dL (ref 31.5–36.0)
MCV: 87.9 fL (ref 79.5–101.0)
NEUT%: 75.3 % (ref 38.4–76.8)
Platelets: 84 10*3/uL — ABNORMAL LOW (ref 145–400)

## 2012-03-30 LAB — COMPREHENSIVE METABOLIC PANEL (CC13)
AST: 35 U/L — ABNORMAL HIGH (ref 5–34)
Alkaline Phosphatase: 48 U/L (ref 40–150)
BUN: 16 mg/dL (ref 7.0–26.0)
Calcium: 9.3 mg/dL (ref 8.4–10.4)
Creatinine: 0.7 mg/dL (ref 0.6–1.1)
Glucose: 160 mg/dl — ABNORMAL HIGH (ref 70–99)

## 2012-03-30 MED ORDER — DEXAMETHASONE SODIUM PHOSPHATE 10 MG/ML IJ SOLN
10.0000 mg | Freq: Once | INTRAMUSCULAR | Status: AC
  Administered 2012-03-30: 10 mg via INTRAVENOUS

## 2012-03-30 MED ORDER — AZITHROMYCIN 250 MG PO TABS: ORAL_TABLET | ORAL | Status: DC

## 2012-03-30 MED ORDER — HEPARIN SOD (PORK) LOCK FLUSH 100 UNIT/ML IV SOLN
500.0000 [IU] | Freq: Once | INTRAVENOUS | Status: AC | PRN
  Administered 2012-03-30: 500 [IU]
  Filled 2012-03-30: qty 5

## 2012-03-30 MED ORDER — SODIUM CHLORIDE 0.9 % IJ SOLN
10.0000 mL | INTRAMUSCULAR | Status: DC | PRN
  Administered 2012-03-30: 10 mL
  Filled 2012-03-30: qty 10

## 2012-03-30 MED ORDER — SODIUM CHLORIDE 0.9 % IV SOLN
Freq: Once | INTRAVENOUS | Status: AC
  Administered 2012-03-30: 13:00:00 via INTRAVENOUS

## 2012-03-30 MED ORDER — SODIUM CHLORIDE 0.9 % IV SOLN
20.0000 mg/m2 | Freq: Once | INTRAVENOUS | Status: AC
  Administered 2012-03-30: 30 mg via INTRAVENOUS
  Filled 2012-03-30: qty 6

## 2012-03-30 MED ORDER — INSULIN PEN NEEDLE 31G X 5 MM MISC: 100.0000 [IU] | Freq: Four times a day (QID) | Status: DC

## 2012-03-30 MED ORDER — ONDANSETRON 8 MG/50ML IVPB (CHCC)
8.0000 mg | Freq: Once | INTRAVENOUS | Status: AC
  Administered 2012-03-30: 8 mg via INTRAVENOUS

## 2012-03-30 NOTE — Progress Notes (Signed)
OFFICE PROGRESS NOTE  CC  [name], MD [address] [place] [address]  DIAGNOSIS: 78 year old female with severe and profound anemia likely due to a primary bone marrow problem. 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  #2 patient has been on Procrit every 2 weeks unfortunately this has not effective and we will discontinue this.  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  CURRENT THERAPY: Decitabine cycle 2 day 1  INTERVAL HISTORY: Meredith Rodriguez 78 y.o. female returns for follow up today.  She's here for cycle 2.  She is doing well and re-energized since her blood transfusion.  Her arm lesion is improving.  She has a tender right lymph node, with no recent scratches, cuts, or sick contacts or associated symptoms, otherwise is feeling well and w/o concerns.      MEDICAL HISTORY: Past Medical History  Diagnosis Date  . Hypertension   . Diabetes mellitus   . Hyperkalemia   . IBS (irritable bowel syndrome)   . Hypercholesterolemia   . Cervical radiculopathy   . Anemia     ALLERGIES:  is allergic to amoxicillin; cephalexin; metformin and related; phenergan; and procardia.  MEDICATIONS:  Current Outpatient Prescriptions  Medication Sig Dispense Refill  . Alum & Mag Hydroxide-Simeth (MAGIC MOUTHWASH W/LIDOCAINE) SOLN Take 5 mLs by mouth 4 (four) times daily as needed.  100 mL  0  . amLODipine (NORVASC) 5 MG tablet Take 2.5 mg by mouth every morning.       Marland Kitchen aspirin 81 MG tablet Take 81 mg by mouth at bedtime.       Marland Kitchen azithromycin (ZITHROMAX Z-PAK) 250 MG tablet 2 tabs x1 on day one, then one tab daily until complete  6 each  0  . calcium gluconate 500 MG tablet Take 500 mg by mouth daily.      . ciprofloxacin (CIPRO) 500 MG tablet Take 1 tablet (500 mg total) by mouth 2 (two) times daily.   14 tablet  6  . conjugated estrogens (PREMARIN) vaginal cream Place 0.5 g vaginally once a week. Sunday.      Marland Kitchen dexamethasone (DECADRON) 4 MG tablet Take 2 tablets (8 mg total) by mouth 2 (two) times daily with a meal. Take daily starting the day after chemotherapy for 2 days. Take with food.  30 tablet  1  . FREESTYLE LITE test strip TEST SUGAR TWICE A DAY AS INSTRUCTED  100 each  11  . insulin lispro (HUMALOG KWIKPEN) 100 UNIT/ML injection Inject 0-6 Units into the skin 3 (three) times daily before meals.  3 mL  12  . Insulin Pen Needle (B-D UF III MINI PEN NEEDLES) 31G X 5 MM MISC 100 Units by Does not apply route 4 (four) times daily.  200 each  6  . lidocaine-prilocaine (EMLA) cream Apply topically as needed.  30 g  4  . LORazepam (ATIVAN) 0.5 MG tablet Take 0.5 mg by mouth every 6 (six) hours as needed. Nausea      . metoprolol (LOPRESSOR) 50 MG tablet Take 50 mg by mouth 2 (two) times daily.      . Omega-3 Fatty Acids (FISH OIL) 1000 MG CAPS Take 1,000 mg by mouth daily.       . ondansetron (ZOFRAN) 8 MG tablet Take 1 tablet (8 mg total) by mouth 2 (two) times daily. Take  two times a day starting the day after chemo for 2 days. Then take two times a day as needed for nausea or vomiting.  30 tablet  1  . prochlorperazine (COMPAZINE) 10 MG tablet Take 1 tablet (10 mg total) by mouth every 6 (six) hours as needed (Nausea or vomiting).  30 tablet  1  . prochlorperazine (COMPAZINE) 25 MG suppository Place 1 suppository (25 mg total) rectally every 12 (twelve) hours as needed for nausea.  12 suppository  3  . simvastatin (ZOCOR) 5 MG tablet Take 5 mg by mouth at bedtime.      . sodium polystyrene (SPS) 15 GM/60ML suspension Take 60 mLs (15 g total) by mouth every 4 (four) hours.  500 mL  0  . sulfamethoxazole-trimethoprim (BACTRIM DS,SEPTRA DS) 800-160 MG per tablet Take 1 tablet by mouth 2 (two) times daily.  10 tablet  0  . [DISCONTINUED] glimepiride (AMARYL) 2 MG tablet Take 1 tablet (2 mg total)  by mouth every morning.  30 tablet  11   Current Facility-Administered Medications  Medication Dose Route Frequency Provider Last Rate Last Dose  . TDaP (BOOSTRIX) injection 0.5 mL  0.5 mL Intramuscular Once [name], MD       Facility-Administered Medications Ordered in Other Visits  Medication Dose Route Frequency Provider Last Rate Last Dose  . [COMPLETED] 0.9 %  sodium chloride infusion   Intravenous Once Victorino December, MD 20 mL/hr at 03/30/12 1305    . decitabine (DACOGEN) 30 mg in sodium chloride 0.9 % 50 mL chemo infusion  20 mg/m2 (Treatment Plan Actual) Intravenous Once Victorino December, MD      . [address] dexamethasone (DECADRON) injection 10 mg  10 mg Intravenous Once Victorino December, MD   10 mg at 03/30/12 1327  . heparin lock flush 100 unit/mL  500 Units Intracatheter Once PRN Victorino December, MD      . ondansetron (ZOFRAN) IVPB 8 mg  8 mg Intravenous Once Victorino December, MD   8 mg at 03/30/12 1327  . sodium chloride 0.9 % injection 10 mL  10 mL Intracatheter PRN Victorino December, MD        SURGICAL HISTORY:  Past Surgical History  Procedure Date  . Varicose veins     surgey 1959  . Tonsilectomy, adenoidectomy, bilateral myringotomy and tubes   . Esophagogastroduodenoscopy 04/01/2011    Procedure: ESOPHAGOGASTRODUODENOSCOPY (EGD);  Surgeon: Barrie Folk, MD;  Location: [facility] ENDOSCOPY;  Service: Endoscopy;  Laterality: N/A;    REVIEW OF SYSTEMS:   General: fatigue (+), night sweats (-), fever (-), pain (-) Lymph: palpable nodes (-) HEENT: vision changes (-), mucositis (-), gum bleeding (-), epistaxis (-) Cardiovascular: chest pain (-), palpitations (-) Pulmonary: shortness of breath (-), dyspnea on exertion (-), cough (-), hemoptysis (-) GI:  Early satiety (-), melena (-), dysphagia (-), nausea/vomiting (-), diarrhea (-) GU: dysuria (-), hematuria (-), incontinence (-) Musculoskeletal: joint swelling (-), joint pain (-), back pain (-) Neuro: weakness (-), numbness  (-), headache (-), confusion (-) Skin: Rash (-), lesions (-), dryness (-) Psych: depression (-), suicidal/homicidal ideation (-), feeling of hopelessness (-)   PHYSICAL EXAMINATION: BP 133/70  Pulse 73  Temp 98.2 F (36.8 C)  Resp 20  Ht 5\' 4"  (1.626 m)  Wt 99 lb 8 oz (45.133 kg)  BMI 17.08 kg/m2 General: Patient is a well appearing female in no acute distress HEENT: PERRLA, sclerae anicteric no conjunctival pallor, left buccal and gum line breakdown.  Neck: supple, 0.25cm tender right supraclavicular lymphadenopathy Lungs: clear to auscultation bilaterally, no wheezes, rhonchi, or rales Cardiovascular: regular rate rhythm, S1, S2, no murmurs, rubs or gallops Abdomen: Soft, non-tender, non-distended, normoactive bowel sounds, no HSM Extremities: warm and well perfused, no clubbing, cyanosis, or edema Skin: No rashes.  Right dime sized erythematous circular lesion with papular whitened surface.   Neuro: Non-focal ECOG PERFORMANCE STATUS: 2 - Symptomatic, <50% confined to bed  LABORATORY DATA: Lab Results  Component Value Date   WBC 5.8 03/30/2012   HGB 11.1* 03/30/2012   HCT 34.0* 03/30/2012   MCV 87.9 03/30/2012   PLT 84* 03/30/2012      Chemistry      Component Value Date/Time   NA 135* 03/23/2012 1047   NA 138 12/18/2011 1018   K 4.1 03/23/2012 1047   K 4.7 12/18/2011 1018   CL 100 03/23/2012 1047   CL 103 12/18/2011 1018   CO2 28 03/23/2012 1047   CO2 30 12/18/2011 1018   BUN 16.0 03/23/2012 1047   BUN 19 12/18/2011 1018   CREATININE 0.8 03/23/2012 1047   CREATININE 0.70 12/18/2011 1018   CREATININE 0.69 07/18/2010 1202   GLU 215* 02/26/2012 1255      Component Value Date/Time   CALCIUM 8.8 03/23/2012 1047   CALCIUM 9.5 12/18/2011 1018   ALKPHOS 40 03/23/2012 1047   ALKPHOS 39 12/18/2011 1018   AST 17 03/23/2012 1047   AST 22 12/18/2011 1018   ALT 19 03/23/2012 1047   ALT 26 12/18/2011 1018   BILITOT 0.87 03/23/2012 1047   BILITOT 0.8 12/18/2011 1018        RADIOGRAPHIC STUDIES:  No results found.  ASSESSMENT: 78 year old female with:  1.  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  2. 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 This is discussed with the daughter and the patient today recommendations were made as below   #3 patient and I and her daughters had an extensive discussion today about the likely transformation to acute leukemia. We discussed the process of chemotherapy, risks, benefits, and potential adverse effects of the drug.  Through much conversation between myself, the patient, and her children, the patient has decided to proceed with Decitabine chemotherapy.  #4 Mucositis  #5 Skin lesion  #6 Lymphadenitis  PLAN:  #1 Ms. Crofford is here for cycle 2 of her Decitabine.  She is feeling well and ready to proceed.  We discussed her blood sugar and she's going to keep a log and I will check in with her during her treatments to ensure she's controlled on her correction insulin.    #2 Her mouth is much improved.  #3 We will keep an eye on the right arm lesion.   #4  I called in a Zpak for the lymphadenitis.   #5 We will see her back next week for labs.  Her 3rd cycle is scheduled for 05/05/11.  All questions were answered. The patient knows to call the clinic with any problems, questions or concerns. We can certainly see the patient much sooner if necessary.  I spent 25 minutes counseling the patient face to face. The total time spent in the appointment was 30 minutes.  This case was reviewed with Dr. Welton Flakes.    Cherie Ouch Lyn Hollingshead, NP [facility] Phone: [phone] 03/30/2012, 1:28 PM

## 2012-03-30 NOTE — Telephone Encounter (Signed)
S/w the pt's dtr and she is aware to stop by to pick up the jan 2014 appt calendar next time they are in the bldg

## 2012-03-30 NOTE — Progress Notes (Signed)
Ok to treat today with platelet count per Augustin Schooling, NP-dhp, rn

## 2012-03-30 NOTE — Telephone Encounter (Signed)
Per staff message and POF I have scheduled appts.  JMW  

## 2012-03-30 NOTE — Patient Instructions (Signed)
[facility] Discharge Instructions for Patients Receiving Chemotherapy  Today you received the following chemotherapy agents Decitabine  To help prevent nausea and vomiting after your treatment, we encourage you to take your nausea medication as prescribed.   If you develop nausea and vomiting that is not controlled by your nausea medication, call the clinic. If it is after clinic hours your family physician or the after hours number for the clinic or go to the Emergency Department.   BELOW ARE SYMPTOMS THAT SHOULD BE REPORTED IMMEDIATELY:  *FEVER GREATER THAN 100.5 F  *CHILLS WITH OR WITHOUT FEVER  NAUSEA AND VOMITING THAT IS NOT CONTROLLED WITH YOUR NAUSEA MEDICATION  *UNUSUAL SHORTNESS OF BREATH  *UNUSUAL BRUISING OR BLEEDING  TENDERNESS IN MOUTH AND THROAT WITH OR WITHOUT PRESENCE OF ULCERS  *URINARY PROBLEMS  *BOWEL PROBLEMS  UNUSUAL RASH Items with * indicate a potential emergency and should be followed up as soon as possible.   Feel free to call the clinic you have any questions or concerns. The clinic phone number is [phone].   I have been informed and understand all the instructions given to me. I know to contact the clinic, my physician, or go to the Emergency Department if any problems should occur. I do not have any questions at this time, but understand that I may call the clinic during office hours   should I have any questions or need assistance in obtaining follow up care.

## 2012-03-30 NOTE — Patient Instructions (Addendum)
Doing well.  Proceed with chemo.  We will see you back next week.  I will call in an antibiotic for your swollen lymph gland.  Please call us if you have any questions or concerns.

## 2012-03-31 ENCOUNTER — Ambulatory Visit (HOSPITAL_BASED_OUTPATIENT_CLINIC_OR_DEPARTMENT_OTHER): Payer: Medicare Other

## 2012-03-31 VITALS — BP 151/49 | HR 68 | Temp 97.8°F | Resp 20

## 2012-03-31 DIAGNOSIS — Z5111 Encounter for antineoplastic chemotherapy: Secondary | ICD-10-CM

## 2012-03-31 DIAGNOSIS — C9200 Acute myeloblastic leukemia, not having achieved remission: Secondary | ICD-10-CM

## 2012-03-31 DIAGNOSIS — C9500 Acute leukemia of unspecified cell type not having achieved remission: Secondary | ICD-10-CM

## 2012-03-31 MED ORDER — SODIUM CHLORIDE 0.9 % IJ SOLN
10.0000 mL | INTRAMUSCULAR | Status: DC | PRN
  Administered 2012-03-31: 10 mL
  Filled 2012-03-31: qty 10

## 2012-03-31 MED ORDER — SODIUM CHLORIDE 0.9 % IV SOLN
Freq: Once | INTRAVENOUS | Status: AC
  Administered 2012-03-31: 13:00:00 via INTRAVENOUS

## 2012-03-31 MED ORDER — SODIUM CHLORIDE 0.9 % IV SOLN
20.0000 mg/m2 | Freq: Once | INTRAVENOUS | Status: AC
  Administered 2012-03-31: 30 mg via INTRAVENOUS
  Filled 2012-03-31: qty 6

## 2012-03-31 MED ORDER — HEPARIN SOD (PORK) LOCK FLUSH 100 UNIT/ML IV SOLN
500.0000 [IU] | Freq: Once | INTRAVENOUS | Status: AC | PRN
  Administered 2012-03-31: 500 [IU]
  Filled 2012-03-31: qty 5

## 2012-03-31 MED ORDER — ONDANSETRON 8 MG/50ML IVPB (CHCC)
8.0000 mg | Freq: Once | INTRAVENOUS | Status: AC
  Administered 2012-03-31: 8 mg via INTRAVENOUS

## 2012-03-31 MED ORDER — DEXAMETHASONE SODIUM PHOSPHATE 10 MG/ML IJ SOLN
10.0000 mg | Freq: Once | INTRAMUSCULAR | Status: AC
  Administered 2012-03-31: 10 mg via INTRAVENOUS

## 2012-03-31 NOTE — Patient Instructions (Addendum)
[facility] Discharge Instructions for Patients Receiving Chemotherapy  Today you received the following chemotherapy agents ; Dacogen.  To help prevent nausea and vomiting after your treatment, we encourage you to take your nausea medication; Ondansetron (zofran),  Prochloraperzine (compazine) and Lorazepam (ativan) as directed.   If you develop nausea and vomiting that is not controlled by your nausea medication, call the clinic. If it is after clinic hours your family physician or the after hours number for the clinic or go to the Emergency Department.   BELOW ARE SYMPTOMS THAT SHOULD BE REPORTED IMMEDIATELY:  *FEVER GREATER THAN 100.5 F  *CHILLS WITH OR WITHOUT FEVER  NAUSEA AND VOMITING THAT IS NOT CONTROLLED WITH YOUR NAUSEA MEDICATION  *UNUSUAL SHORTNESS OF BREATH  *UNUSUAL BRUISING OR BLEEDING  TENDERNESS IN MOUTH AND THROAT WITH OR WITHOUT PRESENCE OF ULCERS  *URINARY PROBLEMS  *BOWEL PROBLEMS  UNUSUAL RASH Items with * indicate a potential emergency and should be followed up as soon as possible.  One of the nurses will contact you 24 hours after your treatment. Please let the nurse know about any problems that you may have experienced. Feel free to call the clinic you have any questions or concerns. The clinic phone number is [phone].   I have been informed and understand all the instructions given to me. I know to contact the clinic, my physician, or go to the Emergency Department if any problems should occur. I do not have any questions at this time, but understand that I may call the clinic during office hours   should I have any questions or need assistance in obtaining follow up care.    __________________________________________  _____________  __________ Signature of Patient or Authorized Representative            Date                   Time    __________________________________________ Nurse's Signature

## 2012-04-01 ENCOUNTER — Ambulatory Visit (HOSPITAL_BASED_OUTPATIENT_CLINIC_OR_DEPARTMENT_OTHER): Payer: Medicare Other

## 2012-04-01 VITALS — BP 158/51 | HR 67 | Temp 98.5°F

## 2012-04-01 DIAGNOSIS — C9500 Acute leukemia of unspecified cell type not having achieved remission: Secondary | ICD-10-CM

## 2012-04-01 DIAGNOSIS — C9200 Acute myeloblastic leukemia, not having achieved remission: Secondary | ICD-10-CM

## 2012-04-01 DIAGNOSIS — Z5111 Encounter for antineoplastic chemotherapy: Secondary | ICD-10-CM

## 2012-04-01 MED ORDER — DEXAMETHASONE SODIUM PHOSPHATE 10 MG/ML IJ SOLN
10.0000 mg | Freq: Once | INTRAMUSCULAR | Status: AC
  Administered 2012-04-01: 10 mg via INTRAVENOUS

## 2012-04-01 MED ORDER — DECITABINE CHEMO INJECTION 50 MG
20.0000 mg/m2 | Freq: Once | INTRAVENOUS | Status: AC
  Administered 2012-04-01: 30 mg via INTRAVENOUS
  Filled 2012-04-01: qty 6

## 2012-04-01 MED ORDER — SODIUM CHLORIDE 0.9 % IJ SOLN
10.0000 mL | INTRAMUSCULAR | Status: DC | PRN
  Administered 2012-04-01: 10 mL
  Filled 2012-04-01: qty 10

## 2012-04-01 MED ORDER — ONDANSETRON 8 MG/50ML IVPB (CHCC)
8.0000 mg | Freq: Once | INTRAVENOUS | Status: AC
  Administered 2012-04-01: 8 mg via INTRAVENOUS

## 2012-04-01 MED ORDER — HEPARIN SOD (PORK) LOCK FLUSH 100 UNIT/ML IV SOLN
500.0000 [IU] | Freq: Once | INTRAVENOUS | Status: AC | PRN
  Administered 2012-04-01: 500 [IU]
  Filled 2012-04-01: qty 5

## 2012-04-01 MED ORDER — SODIUM CHLORIDE 0.9 % IV SOLN
Freq: Once | INTRAVENOUS | Status: AC
  Administered 2012-04-01: 13:00:00 via INTRAVENOUS

## 2012-04-01 NOTE — Patient Instructions (Addendum)
[facility] Discharge Instructions for Patients Receiving Chemotherapy  Today you received the following chemotherapy agents Dacogen  To help prevent nausea and vomiting after your treatment, we encourage you to take your nausea medication as prescribed.   If you develop nausea and vomiting that is not controlled by your nausea medication, call the clinic. If it is after clinic hours your family physician or the after hours number for the clinic or go to the Emergency Department.   BELOW ARE SYMPTOMS THAT SHOULD BE REPORTED IMMEDIATELY:  *FEVER GREATER THAN 100.5 F  *CHILLS WITH OR WITHOUT FEVER  NAUSEA AND VOMITING THAT IS NOT CONTROLLED WITH YOUR NAUSEA MEDICATION  *UNUSUAL SHORTNESS OF BREATH  *UNUSUAL BRUISING OR BLEEDING  TENDERNESS IN MOUTH AND THROAT WITH OR WITHOUT PRESENCE OF ULCERS  *URINARY PROBLEMS  *BOWEL PROBLEMS  UNUSUAL RASH Items with * indicate a potential emergency and should be followed up as soon as possible.  Feel free to call the clinic you have any questions or concerns. The clinic phone number is [phone].   I have been informed and understand all the instructions given to me. I know to contact the clinic, my physician, or go to the Emergency Department if any problems should occur. I do not have any questions at this time, but understand that I may call the clinic during office hours   should I have any questions or need assistance in obtaining follow up care.    __________________________________________  _____________  __________ Signature of Patient or Authorized Representative            Date                   Time    __________________________________________ Nurse's Signature

## 2012-04-02 ENCOUNTER — Ambulatory Visit (HOSPITAL_BASED_OUTPATIENT_CLINIC_OR_DEPARTMENT_OTHER): Payer: Medicare Other

## 2012-04-02 VITALS — BP 151/71 | HR 72 | Temp 97.4°F | Resp 20

## 2012-04-02 DIAGNOSIS — C9200 Acute myeloblastic leukemia, not having achieved remission: Secondary | ICD-10-CM

## 2012-04-02 DIAGNOSIS — C9500 Acute leukemia of unspecified cell type not having achieved remission: Secondary | ICD-10-CM

## 2012-04-02 DIAGNOSIS — Z5111 Encounter for antineoplastic chemotherapy: Secondary | ICD-10-CM

## 2012-04-02 MED ORDER — DEXAMETHASONE SODIUM PHOSPHATE 10 MG/ML IJ SOLN
10.0000 mg | Freq: Once | INTRAMUSCULAR | Status: AC
  Administered 2012-04-02: 10 mg via INTRAVENOUS

## 2012-04-02 MED ORDER — HEPARIN SOD (PORK) LOCK FLUSH 100 UNIT/ML IV SOLN
500.0000 [IU] | Freq: Once | INTRAVENOUS | Status: AC | PRN
  Administered 2012-04-02: 500 [IU]
  Filled 2012-04-02: qty 5

## 2012-04-02 MED ORDER — SODIUM CHLORIDE 0.9 % IV SOLN
20.0000 mg/m2 | Freq: Once | INTRAVENOUS | Status: AC
  Administered 2012-04-02: 30 mg via INTRAVENOUS
  Filled 2012-04-02: qty 6

## 2012-04-02 MED ORDER — SODIUM CHLORIDE 0.9 % IV SOLN
Freq: Once | INTRAVENOUS | Status: AC
  Administered 2012-04-02: 13:00:00 via INTRAVENOUS

## 2012-04-02 MED ORDER — SODIUM CHLORIDE 0.9 % IJ SOLN
10.0000 mL | INTRAMUSCULAR | Status: DC | PRN
  Administered 2012-04-02: 10 mL
  Filled 2012-04-02: qty 10

## 2012-04-02 MED ORDER — ONDANSETRON 8 MG/50ML IVPB (CHCC)
8.0000 mg | Freq: Once | INTRAVENOUS | Status: AC
  Administered 2012-04-02: 8 mg via INTRAVENOUS

## 2012-04-02 NOTE — Patient Instructions (Addendum)
[facility] Discharge Instructions for Patients Receiving Chemotherapy  Today you received the following chemotherapy agents Dacogen.  To help prevent nausea and vomiting after your treatment, we encourage you to take your nausea medication as prescribed.   If you develop nausea and vomiting that is not controlled by your nausea medication, call the clinic. If it is after clinic hours your family physician or the after hours number for the clinic or go to the Emergency Department.   BELOW ARE SYMPTOMS THAT SHOULD BE REPORTED IMMEDIATELY:  *FEVER GREATER THAN 100.5 F  *CHILLS WITH OR WITHOUT FEVER  NAUSEA AND VOMITING THAT IS NOT CONTROLLED WITH YOUR NAUSEA MEDICATION  *UNUSUAL SHORTNESS OF BREATH  *UNUSUAL BRUISING OR BLEEDING  TENDERNESS IN MOUTH AND THROAT WITH OR WITHOUT PRESENCE OF ULCERS  *URINARY PROBLEMS  *BOWEL PROBLEMS  UNUSUAL RASH Items with * indicate a potential emergency and should be followed up as soon as possible.  Feel free to call the clinic you have any questions or concerns. The clinic phone number is [phone].   I have been informed and understand all the instructions given to me. I know to contact the clinic, my physician, or go to the Emergency Department if any problems should occur. I do not have any questions at this time, but understand that I may call the clinic during office hours   should I have any questions or need assistance in obtaining follow up care.    __________________________________________  _____________  __________ Signature of Patient or Authorized Representative            Date                   Time    __________________________________________ Nurse's Signature    

## 2012-04-03 ENCOUNTER — Ambulatory Visit (HOSPITAL_BASED_OUTPATIENT_CLINIC_OR_DEPARTMENT_OTHER): Payer: Medicare Other

## 2012-04-03 VITALS — BP 169/67 | HR 71 | Temp 97.8°F | Resp 18

## 2012-04-03 DIAGNOSIS — C9200 Acute myeloblastic leukemia, not having achieved remission: Secondary | ICD-10-CM

## 2012-04-03 DIAGNOSIS — Z5111 Encounter for antineoplastic chemotherapy: Secondary | ICD-10-CM

## 2012-04-03 DIAGNOSIS — C9500 Acute leukemia of unspecified cell type not having achieved remission: Secondary | ICD-10-CM

## 2012-04-03 MED ORDER — SODIUM CHLORIDE 0.9 % IJ SOLN
10.0000 mL | INTRAMUSCULAR | Status: DC | PRN
  Administered 2012-04-03: 10 mL
  Filled 2012-04-03: qty 10

## 2012-04-03 MED ORDER — SODIUM CHLORIDE 0.9 % IV SOLN
Freq: Once | INTRAVENOUS | Status: AC
  Administered 2012-04-03: 13:00:00 via INTRAVENOUS

## 2012-04-03 MED ORDER — SODIUM CHLORIDE 0.9 % IV SOLN
20.0000 mg/m2 | Freq: Once | INTRAVENOUS | Status: AC
  Administered 2012-04-03: 30 mg via INTRAVENOUS
  Filled 2012-04-03: qty 6

## 2012-04-03 MED ORDER — ONDANSETRON 8 MG/50ML IVPB (CHCC)
8.0000 mg | Freq: Once | INTRAVENOUS | Status: AC
  Administered 2012-04-03: 8 mg via INTRAVENOUS

## 2012-04-03 MED ORDER — DEXAMETHASONE SODIUM PHOSPHATE 10 MG/ML IJ SOLN
10.0000 mg | Freq: Once | INTRAMUSCULAR | Status: AC
  Administered 2012-04-03: 10 mg via INTRAVENOUS

## 2012-04-03 MED ORDER — HEPARIN SOD (PORK) LOCK FLUSH 100 UNIT/ML IV SOLN
500.0000 [IU] | Freq: Once | INTRAVENOUS | Status: AC | PRN
  Administered 2012-04-03: 500 [IU]
  Filled 2012-04-03: qty 5

## 2012-04-03 NOTE — Patient Instructions (Addendum)
[facility] Discharge Instructions for Patients Receiving Chemotherapy  Today you received the following chemotherapy agents Dacogen.  To help prevent nausea and vomiting after your treatment, we encourage you to take your nausea medication as directed.   If you develop nausea and vomiting that is not controlled by your nausea medication, call the clinic. If it is after clinic hours your family physician or the after hours number for the clinic or go to the Emergency Department.   BELOW ARE SYMPTOMS THAT SHOULD BE REPORTED IMMEDIATELY:  *FEVER GREATER THAN 100.5 F  *CHILLS WITH OR WITHOUT FEVER  NAUSEA AND VOMITING THAT IS NOT CONTROLLED WITH YOUR NAUSEA MEDICATION  *UNUSUAL SHORTNESS OF BREATH  *UNUSUAL BRUISING OR BLEEDING  TENDERNESS IN MOUTH AND THROAT WITH OR WITHOUT PRESENCE OF ULCERS  *URINARY PROBLEMS  *BOWEL PROBLEMS  UNUSUAL RASH Items with * indicate a potential emergency and should be followed up as soon as possible.  Feel free to call the clinic you have any questions or concerns. The clinic phone number is [phone].   Decitabine injection for infusion What is this medicine? DECITABINE (dee SYE ta been) is a chemotherapy drug. This medicine reduces the growth of cancer cells. It is used to treat adults with myelodysplastic syndromes. This medicine may be used for other purposes; ask your health care provider or pharmacist if you have questions. What should I tell my health care provider before I take this medicine? They need to know if you have any of these conditions: -infection (especially a virus infection such as chickenpox, cold sores, or herpes) -kidney disease -liver disease -an unusual or allergic reaction to decitabine, other medicines, foods, dyes, or preservatives -pregnant or trying to get pregnant -breast-feeding How should I use this medicine? This medicine is for infusion into a vein. It is administered in a hospital or  clinic by a doctor or health care professional. Talk to your pediatrician regarding the use of this medicine in children. Special care may be needed. Overdosage: If you think you have taken too much of this medicine contact a poison control center or emergency room at once. NOTE: This medicine is only for you. Do not share this medicine with others. What if I miss a dose? It is important not to miss your dose. Call your doctor or health care professional if you are unable to keep an appointment. What may interact with this medicine? -vaccines Talk to your doctor or health care professional before taking any of these medicines: -aspirin -acetaminophen -ibuprofen -ketoprofen -naproxen This list may not describe all possible interactions. Give your health care provider a list of all the medicines, herbs, non-prescription drugs, or dietary supplements you use. Also tell them if you smoke, drink alcohol, or use illegal drugs. Some items may interact with your medicine. What should I watch for while using this medicine? Visit your doctor for checks on your progress. This drug may make you feel generally unwell. This is not uncommon, as chemotherapy can affect healthy cells as well as cancer cells. Report any side effects. Continue your course of treatment even though you feel ill unless your doctor tells you to stop. In some cases, you may be given additional medicines to help with side effects. Follow all directions for their use. Call your doctor or health care professional for advice if you get a fever, chills or sore throat, or other symptoms of a cold or flu. Do not treat yourself. This drug decreases your body's ability to fight  infections. Try to avoid being around people who are sick. This medicine may increase your risk to bruise or bleed. Call your doctor or health care professional if you notice any unusual bleeding. Be careful brushing and flossing your teeth or using a toothpick because  you may get an infection or bleed more easily. If you have any dental work done, tell your dentist you are receiving this medicine. Avoid taking products that contain aspirin, acetaminophen, ibuprofen, naproxen, or ketoprofen unless instructed by your doctor. These medicines may hide a fever. Do not have any vaccinations without your doctor's approval and avoid anyone who has recently had oral polio vaccine. Do not become pregnant while taking this medicine. Women should inform their doctor if they wish to become pregnant or think they might be pregnant. There is a potential for serious side effects to an unborn child. Talk to your health care professional or pharmacist for more information. Do not breast-feed an infant while taking this medicine. Males who take this medicine should use a condom to avoid pregnancy in their partner. Do not father a child during treatment, and for 2 months after treatment is finished. What side effects may I notice from receiving this medicine? Side effects that you should report to your doctor or health care professional as soon as possible: -low blood counts - this medicine may decrease the number of white blood cells, red blood cells and platelets. You may be at increased risk for infections and bleeding. -signs of infection - fever or chills, cough, sore throat, pain or difficulty passing urine -signs of decreased platelets or bleeding - bruising, pinpoint red spots on the skin, black, tarry stools, blood in the urine -signs of decreased red blood cells - unusual weakness or tiredness, fainting spells, lightheadedness -increased blood sugar Side effects that usually do not require medical attention (report to your prescriber or health care professional if they continue or are bothersome): -constipation -diarrhea -headache -loss of appetite -nausea, vomiting -skin rash, itching -stomach pain -water retention -weak or tired This list may not describe all  possible side effects. Call your doctor for medical advice about side effects. You may report side effects to FDA at 1-800-FDA-1088. Where should I keep my medicine? This drug is given in a hospital or clinic and will not be stored at home. NOTE: This sheet is a summary. It may not cover all possible information. If you have questions about this medicine, talk to your doctor, pharmacist, or health care provider.  2013, [employer]. (08/17/2007 4:33:42 PM)

## 2012-04-06 ENCOUNTER — Ambulatory Visit (HOSPITAL_BASED_OUTPATIENT_CLINIC_OR_DEPARTMENT_OTHER): Payer: Medicare Other | Admitting: Adult Health

## 2012-04-06 ENCOUNTER — Encounter: Payer: Self-pay | Admitting: Adult Health

## 2012-04-06 ENCOUNTER — Other Ambulatory Visit (HOSPITAL_BASED_OUTPATIENT_CLINIC_OR_DEPARTMENT_OTHER): Payer: Medicare Other | Admitting: Lab

## 2012-04-06 ENCOUNTER — Telehealth: Payer: Self-pay | Admitting: *Deleted

## 2012-04-06 VITALS — BP 157/76 | HR 85 | Temp 97.9°F | Resp 20 | Ht 64.0 in | Wt 96.3 lb

## 2012-04-06 DIAGNOSIS — D4620 Refractory anemia with excess of blasts, unspecified: Secondary | ICD-10-CM

## 2012-04-06 DIAGNOSIS — C9500 Acute leukemia of unspecified cell type not having achieved remission: Secondary | ICD-10-CM

## 2012-04-06 DIAGNOSIS — C9200 Acute myeloblastic leukemia, not having achieved remission: Secondary | ICD-10-CM

## 2012-04-06 DIAGNOSIS — E119 Type 2 diabetes mellitus without complications: Secondary | ICD-10-CM

## 2012-04-06 DIAGNOSIS — L989 Disorder of the skin and subcutaneous tissue, unspecified: Secondary | ICD-10-CM

## 2012-04-06 LAB — COMPREHENSIVE METABOLIC PANEL (CC13)
ALT: 61 U/L — ABNORMAL HIGH (ref 0–55)
AST: 25 U/L (ref 5–34)
CO2: 25 mEq/L (ref 22–29)
Chloride: 97 mEq/L — ABNORMAL LOW (ref 98–107)
Sodium: 133 mEq/L — ABNORMAL LOW (ref 136–145)
Total Bilirubin: 0.88 mg/dL (ref 0.20–1.20)
Total Protein: 6.6 g/dL (ref 6.4–8.3)

## 2012-04-06 LAB — CBC WITH DIFFERENTIAL/PLATELET
BASO%: 0.6 % (ref 0.0–2.0)
Basophils Absolute: 0 10e3/uL (ref 0.0–0.1)
EOS%: 0.1 % (ref 0.0–7.0)
Eosinophils Absolute: 0 10e3/uL (ref 0.0–0.5)
HCT: 35.8 % (ref 34.8–46.6)
HGB: 12.2 g/dL (ref 11.6–15.9)
LYMPH%: 14.9 % (ref 14.0–49.7)
MCH: 29.8 pg (ref 25.1–34.0)
MCHC: 34.1 g/dL (ref 31.5–36.0)
MCV: 87.2 fL (ref 79.5–101.0)
MONO#: 0 10e3/uL — ABNORMAL LOW (ref 0.1–0.9)
MONO%: 0.3 % (ref 0.0–14.0)
NEUT#: 3.2 10e3/uL (ref 1.5–6.5)
NEUT%: 84.1 % — ABNORMAL HIGH (ref 38.4–76.8)
Platelets: 79 10e3/uL — ABNORMAL LOW (ref 145–400)
RBC: 4.11 10e6/uL (ref 3.70–5.45)
RDW: 13.9 % (ref 11.2–14.5)
WBC: 3.8 10e3/uL — ABNORMAL LOW (ref 3.9–10.3)
lymph#: 0.6 10e3/uL — ABNORMAL LOW (ref 0.9–3.3)

## 2012-04-06 NOTE — Progress Notes (Signed)
OFFICE PROGRESS NOTE  CC  Meredith Covey, MD [address] [place] [address]  DIAGNOSIS: 78 year old female with severe and profound anemia likely due to a primary bone marrow problem. 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  #2 patient has been on Procrit every 2 weeks unfortunately this has not effective and we will discontinue this.  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  CURRENT THERAPY: Decitabine cycle 2 day 8  INTERVAL HISTORY: Meredith Rodriguez 78 y.o. female returns for follow up today.  She's here after cycle 2.  She's feeling well, she's had a dry mouth and constipation, she finally had a BM yesterday.  Her blood sugars have been running from 300-400, but otherwise she's doing well and without questions or concerns.    MEDICAL HISTORY: Past Medical History  Diagnosis Date  . Hypertension   . Diabetes mellitus   . Hyperkalemia   . IBS (irritable bowel syndrome)   . Hypercholesterolemia   . Cervical radiculopathy   . Anemia     ALLERGIES:  is allergic to amoxicillin; cephalexin; metformin and related; phenergan; and procardia.  MEDICATIONS:  Current Outpatient Prescriptions  Medication Sig Dispense Refill  . Alum & Mag Hydroxide-Simeth (MAGIC MOUTHWASH W/LIDOCAINE) SOLN Take 5 mLs by mouth 4 (four) times daily as needed.  100 mL  0  . amLODipine (NORVASC) 5 MG tablet Take 2.5 mg by mouth every morning.       Marland Kitchen aspirin 81 MG tablet Take 81 mg by mouth at bedtime.       Marland Kitchen azithromycin (ZITHROMAX Z-PAK) 250 MG tablet 2 tabs x1 on day one, then one tab daily until complete  6 each  0  . calcium gluconate 500 MG tablet Take 500 mg by mouth daily.      . ciprofloxacin (CIPRO) 500 MG tablet Take 1 tablet (500 mg total) by mouth 2 (two) times daily.  14 tablet  6  . conjugated estrogens  (PREMARIN) vaginal cream Place 0.5 g vaginally once a week. Sunday.      Marland Kitchen dexamethasone (DECADRON) 4 MG tablet Take 2 tablets (8 mg total) by mouth 2 (two) times daily with a meal. Take daily starting the day after chemotherapy for 2 days. Take with food.  30 tablet  1  . FREESTYLE LITE test strip TEST SUGAR TWICE A DAY AS INSTRUCTED  100 each  11  . insulin lispro (HUMALOG KWIKPEN) 100 UNIT/ML injection Inject 0-6 Units into the skin 3 (three) times daily before meals.  3 mL  12  . Insulin Pen Needle (B-D UF III MINI PEN NEEDLES) 31G X 5 MM MISC 100 Units by Does not apply route 4 (four) times daily.  200 each  6  . lidocaine-prilocaine (EMLA) cream Apply topically as needed.  30 g  4  . LORazepam (ATIVAN) 0.5 MG tablet Take 0.5 mg by mouth every 6 (six) hours as needed. Nausea      . metoprolol (LOPRESSOR) 50 MG tablet Take 50 mg by mouth 2 (two) times daily.      . Omega-3 Fatty Acids (FISH OIL) 1000 MG CAPS Take 1,000 mg by mouth daily.       . ondansetron (ZOFRAN) 8 MG tablet Take 1 tablet (8 mg total) by mouth 2 (two) times daily. Take two times a day starting the day after chemo  for 2 days. Then take two times a day as needed for nausea or vomiting.  30 tablet  1  . prochlorperazine (COMPAZINE) 10 MG tablet Take 1 tablet (10 mg total) by mouth every 6 (six) hours as needed (Nausea or vomiting).  30 tablet  1  . prochlorperazine (COMPAZINE) 25 MG suppository Place 1 suppository (25 mg total) rectally every 12 (twelve) hours as needed for nausea.  12 suppository  3  . simvastatin (ZOCOR) 5 MG tablet Take 5 mg by mouth at bedtime.      . sodium polystyrene (SPS) 15 GM/60ML suspension Take 60 mLs (15 g total) by mouth every 4 (four) hours.  500 mL  0  . sulfamethoxazole-trimethoprim (BACTRIM DS,SEPTRA DS) 800-160 MG per tablet Take 1 tablet by mouth 2 (two) times daily.  10 tablet  0  . [DISCONTINUED] glimepiride (AMARYL) 2 MG tablet Take 1 tablet (2 mg total) by mouth every morning.  30 tablet   11   Current Facility-Administered Medications  Medication Dose Route Frequency Provider Last Rate Last Dose  . TDaP (BOOSTRIX) injection 0.5 mL  0.5 mL Intramuscular Once Meredith Covey, MD        SURGICAL HISTORY:  Past Surgical History  Procedure Date  . Varicose veins     surgey 1959  . Tonsilectomy, adenoidectomy, bilateral myringotomy and tubes   . Esophagogastroduodenoscopy 04/01/2011    Procedure: ESOPHAGOGASTRODUODENOSCOPY (EGD);  Surgeon: Barrie Folk, MD;  Location: [facility] ENDOSCOPY;  Service: Endoscopy;  Laterality: N/A;    REVIEW OF SYSTEMS:   General: fatigue (+), night sweats (-), fever (-), pain (-) Lymph: palpable nodes (-) HEENT: vision changes (-), mucositis (-), gum bleeding (-), epistaxis (-) Cardiovascular: chest pain (-), palpitations (-) Pulmonary: shortness of breath (-), dyspnea on exertion (-), cough (-), hemoptysis (-) GI:  Early satiety (-), melena (-), dysphagia (-), nausea/vomiting (-), diarrhea (-) GU: dysuria (-), hematuria (-), incontinence (-) Musculoskeletal: joint swelling (-), joint pain (-), back pain (-) Neuro: weakness (-), numbness (-), headache (-), confusion (-) Skin: Rash (-), lesions (-), dryness (-) Psych: depression (-), suicidal/homicidal ideation (-), feeling of hopelessness (-)   PHYSICAL EXAMINATION: BP 157/76  Pulse 85  Temp 97.9 F (36.6 C) (Oral)  Resp 20  Ht 5\' 4"  (1.626 m)  Wt 96 lb 4.8 oz (43.681 kg)  BMI 16.53 kg/m2 General: Patient is a well appearing female in no acute distress HEENT: PERRLA, sclerae anicteric no conjunctival pallor, left buccal and gum line breakdown. Neck: supple, no lymphadenopathy Lungs: clear to auscultation bilaterally, no wheezes, rhonchi, or rales Cardiovascular: regular rate rhythm, S1, S2, no murmurs, rubs or gallops Abdomen: Soft, non-tender, non-distended, normoactive bowel sounds, no HSM Extremities: warm and well perfused, no clubbing, cyanosis, or edema Skin: No rashes.  Right  dime sized erythematous circular lesion with papular whitened surface.   Neuro: Non-focal ECOG PERFORMANCE STATUS: 2 - Symptomatic, <50% confined to bed  LABORATORY DATA: Lab Results  Component Value Date   WBC 3.8* 04/06/2012   HGB 12.2 04/06/2012   HCT 35.8 04/06/2012   MCV 87.2 04/06/2012   PLT 79* 04/06/2012      Chemistry      Component Value Date/Time   NA 133* 04/06/2012 1011   NA 138 12/18/2011 1018   K 4.4 04/06/2012 1011   K 4.7 12/18/2011 1018   CL 97* 04/06/2012 1011   CL 103 12/18/2011 1018   CO2 25 04/06/2012 1011   CO2 30 12/18/2011 1018   BUN  35.0* 04/06/2012 1011   BUN 19 12/18/2011 1018   CREATININE 1.0 04/06/2012 1011   CREATININE 0.70 12/18/2011 1018   CREATININE 0.69 07/18/2010 1202   GLU 215* 02/26/2012 1255      Component Value Date/Time   CALCIUM 9.2 04/06/2012 1011   CALCIUM 9.5 12/18/2011 1018   ALKPHOS 51 04/06/2012 1011   ALKPHOS 39 12/18/2011 1018   AST 25 04/06/2012 1011   AST 22 12/18/2011 1018   ALT 61* 04/06/2012 1011   ALT 26 12/18/2011 1018   BILITOT 0.88 04/06/2012 1011   BILITOT 0.8 12/18/2011 1018       RADIOGRAPHIC STUDIES:  No results found.  ASSESSMENT: 78 year old female with:  1.  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  2. 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 This is discussed with the daughter and the patient today recommendations were made as below   #3 patient and I and her daughters had an extensive discussion today about the likely transformation to acute leukemia. We discussed the process of chemotherapy, risks, benefits, and potential adverse effects of the drug.  Through much conversation between myself, the patient, and her children, the patient has decided to proceed with Decitabine chemotherapy.  #4 Skin lesion  PLAN:  #1 Ms. Lambing is here for cycle 2 of her Decitabine.  She is feeling well and  ready to proceed.  We discussed her blood sugar and she's going to keep a log and I will check in with her during her treatments to ensure she's controlled on her correction insulin.  In review of this log, her blood sugars have been very poorly controlled. I have increased her correction insulin scale, and given her the copy.    #2 We will keep an eye on the right arm lesion, this is stable.   #3 We will see her back next week for labs.  Her 3rd cycle is scheduled for 05/05/11.  All questions were answered. The patient knows to call the clinic with any problems, questions or concerns. We can certainly see the patient much sooner if necessary.  I spent 25 minutes counseling the patient face to face. The total time spent in the appointment was 30 minutes.  This case was reviewed with Dr. Welton Flakes.    Meredith Ouch Lyn Hollingshead, NP Medical Oncology [facility] Phone: [phone] 04/06/2012, 11:24 AM

## 2012-04-06 NOTE — Patient Instructions (Addendum)
Doing well, please see attached revised correction insulin dosing.  Please call us if you have any questions or concerns.   We will see you next week.

## 2012-04-06 NOTE — Telephone Encounter (Signed)
Gave patient new appointment for the lab appointment on 04-13-2012 starting at 8:45am

## 2012-04-08 ENCOUNTER — Ambulatory Visit: Payer: Medicare Other

## 2012-04-08 ENCOUNTER — Telehealth: Payer: Self-pay | Admitting: Medical Oncology

## 2012-04-08 DIAGNOSIS — E119 Type 2 diabetes mellitus without complications: Secondary | ICD-10-CM

## 2012-04-08 MED ORDER — GLUCOSE BLOOD VI STRP: ORAL_STRIP | Status: DC

## 2012-04-08 NOTE — Telephone Encounter (Signed)
Patient called in to report that the pharmacy told her the Rx for test strips was not in. Called and spoke to pharmacist, he states patient is medicare and this can not be escribed, however, if we print with Dx code, testing instructions and pts insulin status, we can fax with handwritten MD signature. Will have Dr Welton Flakes do this and notify the patient.

## 2012-04-08 NOTE — Telephone Encounter (Signed)
Per MD, ok to refill test strips. Prescription escribed to patient's pharmacy. Called patient to inform her of refill. No questions at this time.

## 2012-04-08 NOTE — Telephone Encounter (Signed)
Patient [name] requesting a refill of test strips. States since she has been checking blood sugar "four times a day, I have run out of test strips and I am down to only one." Will review with MD.

## 2012-04-08 NOTE — Addendum Note (Signed)
Addended by: Laroy Apple E on: 04/08/2012 04:56 PM   Modules accepted: Orders

## 2012-04-08 NOTE — Telephone Encounter (Signed)
Ok to refill strips

## 2012-04-13 ENCOUNTER — Other Ambulatory Visit: Payer: Medicare Other | Admitting: Lab

## 2012-04-13 ENCOUNTER — Ambulatory Visit (HOSPITAL_BASED_OUTPATIENT_CLINIC_OR_DEPARTMENT_OTHER): Payer: Medicare Other

## 2012-04-13 ENCOUNTER — Other Ambulatory Visit: Payer: Self-pay | Admitting: Medical Oncology

## 2012-04-13 ENCOUNTER — Ambulatory Visit (HOSPITAL_BASED_OUTPATIENT_CLINIC_OR_DEPARTMENT_OTHER): Payer: Medicare Other | Admitting: Adult Health

## 2012-04-13 ENCOUNTER — Encounter: Payer: Self-pay | Admitting: Adult Health

## 2012-04-13 ENCOUNTER — Other Ambulatory Visit: Payer: Self-pay | Admitting: Oncology

## 2012-04-13 ENCOUNTER — Telehealth: Payer: Self-pay | Admitting: *Deleted

## 2012-04-13 ENCOUNTER — Encounter (HOSPITAL_COMMUNITY)
Admission: RE | Admit: 2012-04-13 | Discharge: 2012-04-13 | Disposition: A | Discharge status: Home / Self Care | Payer: Medicare Other | Source: Ambulatory Visit | Attending: Oncology | Admitting: Oncology

## 2012-04-13 ENCOUNTER — Telehealth: Payer: Self-pay | Admitting: Medical Oncology

## 2012-04-13 VITALS — BP 136/54 | HR 77 | Temp 96.8°F | Resp 18

## 2012-04-13 VITALS — BP 124/86 | HR 80 | Temp 98.4°F | Resp 20 | Ht 64.0 in | Wt 97.3 lb

## 2012-04-13 DIAGNOSIS — D696 Thrombocytopenia, unspecified: Secondary | ICD-10-CM

## 2012-04-13 DIAGNOSIS — D649 Anemia, unspecified: Secondary | ICD-10-CM

## 2012-04-13 DIAGNOSIS — D4620 Refractory anemia with excess of blasts, unspecified: Secondary | ICD-10-CM

## 2012-04-13 DIAGNOSIS — D709 Neutropenia, unspecified: Secondary | ICD-10-CM

## 2012-04-13 DIAGNOSIS — C9200 Acute myeloblastic leukemia, not having achieved remission: Secondary | ICD-10-CM

## 2012-04-13 DIAGNOSIS — D469 Myelodysplastic syndrome, unspecified: Secondary | ICD-10-CM | POA: Insufficient documentation

## 2012-04-13 DIAGNOSIS — R739 Hyperglycemia, unspecified: Secondary | ICD-10-CM

## 2012-04-13 LAB — CBC WITH DIFFERENTIAL/PLATELET
Basophils Absolute: 0 10*3/uL (ref 0.0–0.1)
EOS%: 3.2 % (ref 0.0–7.0)
HGB: 9.2 g/dL — ABNORMAL LOW (ref 11.6–15.9)
LYMPH%: 83.9 % — ABNORMAL HIGH (ref 14.0–49.7)
MCH: 28.1 pg (ref 25.1–34.0)
MCV: 86.2 fL (ref 79.5–101.0)
MONO%: 1.6 % (ref 0.0–14.0)
NEUT%: 11.3 % — ABNORMAL LOW (ref 38.4–76.8)
Platelets: 7 10*3/uL — CL (ref 145–400)
RDW: 14 % (ref 11.2–14.5)

## 2012-04-13 LAB — TYPE AND SCREEN
ABO/RH(D): AB NEG
Antibody Screen: NEGATIVE
Donor AG Type: NEGATIVE
Unit division: 0

## 2012-04-13 LAB — PREPARE RBC (CROSSMATCH)

## 2012-04-13 LAB — COMPREHENSIVE METABOLIC PANEL (CC13)
Alkaline Phosphatase: 50 U/L (ref 40–150)
BUN: 21 mg/dL (ref 7.0–26.0)
Creatinine: 0.8 mg/dL (ref 0.6–1.1)
Glucose: 149 mg/dl — ABNORMAL HIGH (ref 70–99)
Total Bilirubin: 0.82 mg/dL (ref 0.20–1.20)

## 2012-04-13 MED ORDER — ACETAMINOPHEN 325 MG PO TABS: 650.0000 mg | ORAL_TABLET | Freq: Once | ORAL | Status: DC

## 2012-04-13 MED ORDER — FREESTYLE LANCETS MISC: 1.0000 | Freq: Four times a day (QID) | Status: DC

## 2012-04-13 MED ORDER — DIPHENHYDRAMINE HCL 25 MG PO TABS
25.0000 mg | ORAL_TABLET | Freq: Once | ORAL | Status: DC
  Filled 2012-04-13: qty 1

## 2012-04-13 NOTE — Progress Notes (Signed)
Pt received one unit of platelets, tolerated well. No complaints voiced. Ambulatory with spouse.  dmr

## 2012-04-13 NOTE — Patient Instructions (Addendum)
Doing well.  Please stop back by the lab for a type and cross.  You will receive platelets at 2pm today.  Please call us if you have any questions or concerns.      Patient Neutropenia Instruction Sheet  Diagnosis: AML      Treating Physician: Drue Second, MD  Treatment: 1. Type of chemotherapy: Decitabine 2. Date of last treatment: 03/30/12  Last Blood Counts: WBC 0.6 ANC100 HGB 9.2 HCT 28.2 PLT 7      Prophylactic Antibiotics: Cipro 500 mg by mouth twice a day Instructions: 1. Monitor temperature and call if fever  greater than 100.5, chills, shaking chills (rigors) 2. Call Physician on-call at [phone] 3. Give him/her symptoms and list of medications that you are taking and your last blood count.

## 2012-04-13 NOTE — Telephone Encounter (Signed)
2 units of PRBCs 12/23  Sent michelle email to set up patient for blood on the 04-20-2012

## 2012-04-13 NOTE — Telephone Encounter (Signed)
Sent patient back to the lab to get typed and crossed

## 2012-04-13 NOTE — Patient Instructions (Addendum)
Platelet Transfusion Information This is information about transfusions of platelets. Platelets are tiny cells made by the bone marrow and found in the blood. When a blood vessel is damaged platelets rush to the damaged area to help form a clot. This begins the healing process. When platelets get very low your blood may have trouble clotting. This may be from:  Illness.  Blood disorder.  Chemotherapy to treat cancer. Often lower platelet counts do not usually cause problems.  Platelets usually last for 7 to 10 days. If they are not used not used in an injury, they are broken down by the liver or spleen. Symptoms of low platelet count include:  Nosebleeds.  Bleeding gums.  Heavy periods.  Bruising and tiny blood spots in the skin.  Pin point spots of bleeding are called (petechiae).  Larger bruises (purpura).  Bleeding can be more serious if it happens in the brain or bowel. Platelet transfusions are often used to keep the platelet count at an acceptable level. Serious bleeding due to low platelets is uncommon. RISKS AND COMPLICATIONS Severe side effects from platelet transfusions are uncommon. Minor reactions may include:  Itching.  Rashes.  High temperature and shivering. Medications are available to stop transfusion reactions. Let your caregivers know if you develop any of the above problems.  If you are having platelet transfusions frequently they may get less effective. This is called becoming refractory to platelets. It is uncommon. This can happen from non-immune causes and immune causes. Non-immune causes include:  High temperatures.  Some medications.  An enlarged spleen. Immune causes happen when your body discovers the platelets are not your own and begin making antibodies against them. The antibodies kill the platelets quickly. Even with platelet transfusions you may still notice problems with bleeding or bruising. Let your caregivers know about this. Other things  can be done to help if this happens.  BEFORE THE PROCEDURE   Your doctors will check your platelet count regularly.  If the platelet count is too low it may be necessary to have a platelet transfusion.  This is more important before certain procedures with a risk of bleeding such as a spinal tap.  Platelet transfusion reduces the risk of bleeding during or after the procedure.  Except in emergencies, giving a transfusion requires a written consent. Before blood is taken from a donor, a complete history is taken to make sure the person has no history of previous diseases, nor engages in risky social behavior. Examples of this are intravenous drug use or sexual activity with multiple partners. This could lead to infected blood or blood products being used. This history is done even in spite of the extensive testing to make sure the blood is safe. All blood products transfused are tested to make sure it is a match for the person getting the blood. It is also checked for infections. Blood is the safest it has ever been. The risk of getting an infection is very low. PROCEDURE  The platelets are stored in small plastic bags which are kept at a low temperature.  Each bag is called a unit and sometimes two units are given. They are given through an intravenous line by drip infusion over about one half hour.  Usually blood is collected from multiple people to get enough to transfuse.  Sometimes, the platelets are collected from a single person. This is done using a special machine that separates the platelets from the blood. The machine is called an apheresis machine. Platelets collected   in this way are called apheresed platelets. Apheresed platelets reduce the risk of becoming sensitive to the platelets. This lowers the chances of having a transfusion reaction.  As it only takes a short time to give the platelets, this treatment can be given in an outpatients department. Platelets can also be given  before or after other treatments. SEEK IMMEDIATE MEDICAL CARE IF: Any of the following symptoms over the next 12 hours or several days:  Shaking chills.  Fever with a temperature greater than 102 F (38.9 C) develops.  Back pain or muscle pain.  People around you feel you are not acting correctly, or you are confused.  Blood in the urine or bowel movements or bleeding from any place in your body.  Shortness of breath, or difficulty breathing.  Dizziness.  Fainting.  You break out in a rash or develop hives.  You have a decrease in the amount of urine you are putting out, or the urine turns a dark color or changes to pink, red, or brown.  A severe headache or stiff neck.  Bruising more easily. Document Released: 02/10/2007 Document Revised: 07/08/2011 Document Reviewed: 02/10/2007 [employer] Patient Information 2013 [employer].  

## 2012-04-13 NOTE — Telephone Encounter (Signed)
Per staff message and POF I have scheduled appts.  JMW  

## 2012-04-13 NOTE — Progress Notes (Signed)
OFFICE PROGRESS NOTE  CC  [name], MD [address] [place] [address]  DIAGNOSIS: 78 year old female with severe and profound anemia likely due to a primary bone marrow problem. 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  #2 patient has been on Procrit every 2 weeks unfortunately this has not effective and we will discontinue this.  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  CURRENT THERAPY: Decitabine cycle 2 day 15  INTERVAL HISTORY: Meredith Rodriguez 78 y.o. female returns for follow up today.  She's here after cycle 2.  She's feeling well today.  She is having normal bowel movements once she started taking some stool softeners.  Her blood sugars have been ranging in between 100-200.  She's had no fevers, chills, or any other concerns.    MEDICAL HISTORY: Past Medical History  Diagnosis Date  . Hypertension   . Diabetes mellitus   . Hyperkalemia   . IBS (irritable bowel syndrome)   . Hypercholesterolemia   . Cervical radiculopathy   . Anemia     ALLERGIES:  is allergic to amoxicillin; cephalexin; metformin and related; phenergan; and procardia.  MEDICATIONS:  Current Outpatient Prescriptions  Medication Sig Dispense Refill  . Alum & Mag Hydroxide-Simeth (MAGIC MOUTHWASH W/LIDOCAINE) SOLN Take 5 mLs by mouth 4 (four) times daily as needed.  100 mL  0  . amLODipine (NORVASC) 5 MG tablet Take 2.5 mg by mouth every morning.       Marland Kitchen aspirin 81 MG tablet Take 81 mg by mouth at bedtime.       . calcium gluconate 500 MG tablet Take 500 mg by mouth daily.      . ciprofloxacin (CIPRO) 500 MG tablet Take 1 tablet (500 mg total) by mouth 2 (two) times daily.  14 tablet  6  . conjugated estrogens (PREMARIN) vaginal cream Place 0.5 g vaginally once a week. Sunday.      Marland Kitchen dexamethasone (DECADRON) 4 MG  tablet Take 2 tablets (8 mg total) by mouth 2 (two) times daily with a meal. Take daily starting the day after chemotherapy for 2 days. Take with food.  30 tablet  1  . glucose blood (FREESTYLE LITE) test strip Test blood sugar QID or as directed by MD.  100 each  11  . insulin lispro (HUMALOG KWIKPEN) 100 UNIT/ML injection Inject 0-6 Units into the skin 3 (three) times daily before meals.  3 mL  12  . Insulin Pen Needle (B-D UF III MINI PEN NEEDLES) 31G X 5 MM MISC 100 Units by Does not apply route 4 (four) times daily.  200 each  6  . lidocaine-prilocaine (EMLA) cream Apply topically as needed.  30 g  4  . LORazepam (ATIVAN) 0.5 MG tablet Take 0.5 mg by mouth every 6 (six) hours as needed. Nausea      . metoprolol (LOPRESSOR) 50 MG tablet Take 50 mg by mouth 2 (two) times daily.      . Omega-3 Fatty Acids (FISH OIL) 1000 MG CAPS Take 1,000 mg by mouth daily.       . ondansetron (ZOFRAN) 8 MG tablet Take 1 tablet (8 mg total) by mouth 2 (two) times daily. Take two times a day starting the day after chemo for 2 days. Then take two times a day as needed for nausea or vomiting.  30 tablet  1  . prochlorperazine (COMPAZINE) 10 MG tablet Take 1 tablet (10 mg total) by mouth every 6 (six) hours as needed (Nausea or vomiting).  30 tablet  1  . prochlorperazine (COMPAZINE) 25 MG suppository Place 1 suppository (25 mg total) rectally every 12 (twelve) hours as needed for nausea.  12 suppository  3  . simvastatin (ZOCOR) 5 MG tablet Take 5 mg by mouth at bedtime.      . sodium polystyrene (SPS) 15 GM/60ML suspension Take 60 mLs (15 g total) by mouth every 4 (four) hours.  500 mL  0  . sulfamethoxazole-trimethoprim (BACTRIM DS,SEPTRA DS) 800-160 MG per tablet Take 1 tablet by mouth 2 (two) times daily.  10 tablet  0  . Lancets (FREESTYLE) lancets 1 each by Other route 4 (four) times daily. Use as instructed  200 each  12  . [DISCONTINUED] glimepiride (AMARYL) 2 MG tablet Take 1 tablet (2 mg total) by mouth  every morning.  30 tablet  11   Current Facility-Administered Medications  Medication Dose Route Frequency Provider Last Rate Last Dose  . acetaminophen (TYLENOL) tablet 650 mg  650 mg Oral Once Augustin Schooling, NP      . diphenhydrAMINE (BENADRYL) tablet 25 mg  25 mg Oral Once Augustin Schooling, NP      . TDaP (BOOSTRIX) injection 0.5 mL  0.5 mL Intramuscular Once [name], MD        SURGICAL HISTORY:  Past Surgical History  Procedure Date  . Varicose veins     surgey 1959  . Tonsilectomy, adenoidectomy, bilateral myringotomy and tubes   . Esophagogastroduodenoscopy 04/01/2011    Procedure: ESOPHAGOGASTRODUODENOSCOPY (EGD);  Surgeon: Barrie Folk, MD;  Location: [facility] ENDOSCOPY;  Service: Endoscopy;  Laterality: N/A;    REVIEW OF SYSTEMS:   General: fatigue (+), night sweats (-), fever (-), pain (-) Lymph: palpable nodes (-) HEENT: vision changes (-), mucositis (-), gum bleeding (-), epistaxis (-) Cardiovascular: chest pain (-), palpitations (-) Pulmonary: shortness of breath (-), dyspnea on exertion (-), cough (-), hemoptysis (-) GI:  Early satiety (-), melena (-), dysphagia (-), nausea/vomiting (-), diarrhea (-) GU: dysuria (-), hematuria (-), incontinence (-) Musculoskeletal: joint swelling (-), joint pain (-), back pain (-) Neuro: weakness (-), numbness (-), headache (-), confusion (-) Skin: Rash (-), lesions (-), dryness (-) Psych: depression (-), suicidal/homicidal ideation (-), feeling of hopelessness (-)   PHYSICAL EXAMINATION: BP 124/86  Pulse 80  Temp 98.4 F (36.9 C) (Oral)  Resp 20  Ht 5\' 4"  (1.626 m)  Wt 97 lb 4.8 oz (44.135 kg)  BMI 16.70 kg/m2 General: Patient is a well appearing female in no acute distress HEENT: PERRLA, sclerae anicteric no conjunctival pallor, MMM no erythema or exudate Neck: supple, no lymphadenopathy Lungs: clear to auscultation bilaterally, no wheezes, rhonchi, or rales Cardiovascular: regular rate rhythm, S1, S2, no murmurs,  rubs or gallops Abdomen: Soft, non-tender, non-distended, normoactive bowel sounds, no HSM Extremities: warm and well perfused, no clubbing, cyanosis, or edema Skin: No rashes.    Neuro: Non-focal ECOG PERFORMANCE STATUS: 1  LABORATORY DATA: Lab Results  Component Value Date   WBC 0.6* 04/13/2012   HGB 9.2* 04/13/2012   HCT 28.2* 04/13/2012   MCV 86.2 04/13/2012   PLT 7* 04/13/2012  ANC 100    Chemistry      Component Value Date/Time   NA 141 04/13/2012 0842   NA 138 12/18/2011 1018   K 4.1 04/13/2012 0842   K 4.7 12/18/2011 1018  CL 104 04/13/2012 0842   CL 103 12/18/2011 1018   CO2 29 04/13/2012 0842   CO2 30 12/18/2011 1018   BUN 21.0 04/13/2012 0842   BUN 19 12/18/2011 1018   CREATININE 0.8 04/13/2012 0842   CREATININE 0.70 12/18/2011 1018   CREATININE 0.69 07/18/2010 1202   GLU 215* 02/26/2012 1255      Component Value Date/Time   CALCIUM 9.0 04/13/2012 0842   CALCIUM 9.5 12/18/2011 1018   ALKPHOS 50 04/13/2012 0842   ALKPHOS 39 12/18/2011 1018   AST 34 04/13/2012 0842   AST 22 12/18/2011 1018   ALT 62* 04/13/2012 0842   ALT 26 12/18/2011 1018   BILITOT 0.82 04/13/2012 0842   BILITOT 0.8 12/18/2011 1018       RADIOGRAPHIC STUDIES:  No results found.  ASSESSMENT: 78 year old female with:  1.  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  2. 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 This is discussed with the daughter and the patient today recommendations were made as below   #3 patient and I and her daughters had an extensive discussion today about the likely transformation to acute leukemia. We discussed the process of chemotherapy, risks, benefits, and potential adverse effects of the drug.  Through much conversation between myself, the patient, and her children, the patient has decided to proceed with Decitabine chemotherapy.  #4  Skin lesion  PLAN:  #1 Ms. Macari is here for cycle 2 of her Decitabine.  She is feeling well and ready to proceed.  Her blood sugar is doing better this week.  She is thrombocytopenic and will receive platelets today.  She's also neutropenic so she will have her Cipro refilled and begin taking this twice a day.  I counseled her on neutropenic precautions.    #2 We will keep an eye on the right arm lesion, this is stable.   #3 We will see her back next week for labs.  Her 3rd cycle is scheduled for 05/05/11.  All questions were answered. The patient knows to call the clinic with any problems, questions or concerns. We can certainly see the patient much sooner if necessary.  I spent 25 minutes counseling the patient face to face. The total time spent in the appointment was 30 minutes.  This case was reviewed with Dr. Welton Flakes.    Cherie Ouch Lyn Hollingshead, NP Medical Oncology [facility] Phone: [phone] 04/13/2012, 12:51 PM

## 2012-04-14 LAB — PREPARE PLATELET PHERESIS: Unit division: 0

## 2012-04-20 ENCOUNTER — Other Ambulatory Visit: Payer: Medicare Other | Admitting: Lab

## 2012-04-20 ENCOUNTER — Ambulatory Visit (HOSPITAL_BASED_OUTPATIENT_CLINIC_OR_DEPARTMENT_OTHER): Payer: Medicare Other | Admitting: Adult Health

## 2012-04-20 ENCOUNTER — Other Ambulatory Visit (HOSPITAL_BASED_OUTPATIENT_CLINIC_OR_DEPARTMENT_OTHER): Payer: Medicare Other | Admitting: Lab

## 2012-04-20 ENCOUNTER — Telehealth: Payer: Self-pay | Admitting: *Deleted

## 2012-04-20 ENCOUNTER — Ambulatory Visit (HOSPITAL_BASED_OUTPATIENT_CLINIC_OR_DEPARTMENT_OTHER): Payer: Medicare Other

## 2012-04-20 ENCOUNTER — Encounter: Payer: Self-pay | Admitting: Adult Health

## 2012-04-20 VITALS — BP 155/58 | HR 74 | Temp 99.1°F | Resp 18

## 2012-04-20 VITALS — BP 125/58 | HR 86 | Temp 98.4°F | Resp 20 | Ht 64.0 in | Wt 99.8 lb

## 2012-04-20 DIAGNOSIS — D469 Myelodysplastic syndrome, unspecified: Secondary | ICD-10-CM

## 2012-04-20 DIAGNOSIS — D4620 Refractory anemia with excess of blasts, unspecified: Secondary | ICD-10-CM

## 2012-04-20 DIAGNOSIS — D649 Anemia, unspecified: Secondary | ICD-10-CM

## 2012-04-20 DIAGNOSIS — C9200 Acute myeloblastic leukemia, not having achieved remission: Secondary | ICD-10-CM

## 2012-04-20 DIAGNOSIS — D709 Neutropenia, unspecified: Secondary | ICD-10-CM

## 2012-04-20 DIAGNOSIS — L989 Disorder of the skin and subcutaneous tissue, unspecified: Secondary | ICD-10-CM

## 2012-04-20 LAB — COMPREHENSIVE METABOLIC PANEL (CC13)
ALT: 35 U/L (ref 0–55)
Albumin: 3.1 g/dL — ABNORMAL LOW (ref 3.5–5.0)
CO2: 28 mEq/L (ref 22–29)
Calcium: 8.6 mg/dL (ref 8.4–10.4)
Chloride: 102 mEq/L (ref 98–107)
Glucose: 193 mg/dl — ABNORMAL HIGH (ref 70–99)
Sodium: 138 mEq/L (ref 136–145)
Total Bilirubin: 0.88 mg/dL (ref 0.20–1.20)
Total Protein: 5.9 g/dL — ABNORMAL LOW (ref 6.4–8.3)

## 2012-04-20 LAB — CBC WITH DIFFERENTIAL/PLATELET
BASO%: 1.7 % (ref 0.0–2.0)
Eosinophils Absolute: 0 10*3/uL (ref 0.0–0.5)
HCT: 20.5 % — ABNORMAL LOW (ref 34.8–46.6)
LYMPH%: 44.5 % (ref 14.0–49.7)
MCHC: 34.9 g/dL (ref 31.5–36.0)
MONO#: 0 10*3/uL — ABNORMAL LOW (ref 0.1–0.9)
NEUT#: 0.6 10*3/uL — ABNORMAL LOW (ref 1.5–6.5)
Platelets: 41 10*3/uL — ABNORMAL LOW (ref 145–400)
RBC: 2.39 10*6/uL — ABNORMAL LOW (ref 3.70–5.45)
WBC: 1.1 10*3/uL — ABNORMAL LOW (ref 3.9–10.3)
lymph#: 0.5 10*3/uL — ABNORMAL LOW (ref 0.9–3.3)

## 2012-04-20 LAB — PREPARE RBC (CROSSMATCH)

## 2012-04-20 LAB — HOLD TUBE, BLOOD BANK

## 2012-04-20 MED ORDER — HEPARIN SOD (PORK) LOCK FLUSH 100 UNIT/ML IV SOLN
250.0000 [IU] | INTRAVENOUS | Status: DC | PRN
  Filled 2012-04-20: qty 5

## 2012-04-20 MED ORDER — SODIUM CHLORIDE 0.9 % IV SOLN
250.0000 mL | Freq: Once | INTRAVENOUS | Status: AC
  Administered 2012-04-20: 250 mL via INTRAVENOUS

## 2012-04-20 MED ORDER — ACETAMINOPHEN 325 MG PO TABS
650.0000 mg | ORAL_TABLET | Freq: Once | ORAL | Status: AC
  Administered 2012-04-20: 650 mg via ORAL

## 2012-04-20 MED ORDER — SODIUM CHLORIDE 0.9 % IJ SOLN
10.0000 mL | INTRAMUSCULAR | Status: AC | PRN
  Administered 2012-04-20: 10 mL
  Filled 2012-04-20: qty 10

## 2012-04-20 MED ORDER — DIPHENHYDRAMINE HCL 25 MG PO CAPS
25.0000 mg | ORAL_CAPSULE | Freq: Once | ORAL | Status: AC
  Administered 2012-04-20: 25 mg via ORAL

## 2012-04-20 MED ORDER — HEPARIN SOD (PORK) LOCK FLUSH 100 UNIT/ML IV SOLN
500.0000 [IU] | Freq: Every day | INTRAVENOUS | Status: AC | PRN
  Administered 2012-04-20: 500 [IU]
  Filled 2012-04-20: qty 5

## 2012-04-20 NOTE — Telephone Encounter (Signed)
1/13, 1/20, 1/27, 2/3  

## 2012-04-20 NOTE — Patient Instructions (Signed)
Blood Transfusion Information WHAT IS A BLOOD TRANSFUSION? A transfusion is the replacement of blood or some of its parts. Blood is made up of multiple cells which provide different functions.  Red blood cells carry oxygen and are used for blood loss replacement.  White blood cells fight against infection.  Platelets control bleeding.  Plasma helps clot blood.  Other blood products are available for specialized needs, such as hemophilia or other clotting disorders. BEFORE THE TRANSFUSION  Who gives blood for transfusions?   You may be able to donate blood to be used at a later date on yourself (autologous donation).  Relatives can be asked to donate blood. This is generally not any safer than if you have received blood from a stranger. The same precautions are taken to ensure safety when a relative's blood is donated.  Healthy volunteers who are fully evaluated to make sure their blood is safe. This is blood bank blood. Transfusion therapy is the safest it has ever been in the practice of medicine. Before blood is taken from a donor, a complete history is taken to make sure that person has no history of diseases nor engages in risky social behavior (examples are intravenous drug use or sexual activity with multiple partners). The donor's travel history is screened to minimize risk of transmitting infections, such as malaria. The donated blood is tested for signs of infectious diseases, such as HIV and hepatitis. The blood is then tested to be sure it is compatible with you in order to minimize the chance of a transfusion reaction. If you or a relative donates blood, this is often done in anticipation of surgery and is not appropriate for emergency situations. It takes many days to process the donated blood. RISKS AND COMPLICATIONS Although transfusion therapy is very safe and saves many lives, the main dangers of transfusion include:   Getting an infectious disease.  Developing a  transfusion reaction. This is an allergic reaction to something in the blood you were given. Every precaution is taken to prevent this. The decision to have a blood transfusion has been considered carefully by your caregiver before blood is given. Blood is not given unless the benefits outweigh the risks. AFTER THE TRANSFUSION  Right after receiving a blood transfusion, you will usually feel much better and more energetic. This is especially true if your red blood cells have gotten low (anemic). The transfusion raises the level of the red blood cells which carry oxygen, and this usually causes an energy increase.  The nurse administering the transfusion will monitor you carefully for complications. HOME CARE INSTRUCTIONS  No special instructions are needed after a transfusion. You may find your energy is better. Speak with your caregiver about any limitations on activity for underlying diseases you may have. SEEK MEDICAL CARE IF:   Your condition is not improving after your transfusion.  You develop redness or irritation at the intravenous (IV) site. SEEK IMMEDIATE MEDICAL CARE IF:  Any of the following symptoms occur over the next 12 hours:  Shaking chills.  You have a temperature by mouth above 102 F (38.9 C), not controlled by medicine.  Chest, back, or muscle pain.  People around you feel you are not acting correctly or are confused.  Shortness of breath or difficulty breathing.  Dizziness and fainting.  You get a rash or develop hives.  You have a decrease in urine output.  Your urine turns a dark color or changes to pink, red, or brown. Any of the following   symptoms occur over the next 10 days:  You have a temperature by mouth above 102 F (38.9 C), not controlled by medicine.  Shortness of breath.  Weakness after normal activity.  The white part of the eye turns yellow (jaundice).  You have a decrease in the amount of urine or are urinating less often.  Your  urine turns a dark color or changes to pink, red, or brown. Document Released: 04/12/2000 Document Revised: 07/08/2011 Document Reviewed: 11/30/2007 [employer] Patient Information 2013 [employer].  

## 2012-04-20 NOTE — Progress Notes (Signed)
OFFICE PROGRESS NOTE  CC  Meredith Covey, MD [address] [place] [address]  DIAGNOSIS: 78 year old female with severe and profound anemia likely due to a primary bone marrow problem. 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  #2 patient has been on Procrit every 2 weeks unfortunately this has not effective and we will discontinue this.  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  CURRENT THERAPY: Decitabine cycle 2 day 21  INTERVAL HISTORY: Meredith Rodriguez 78 y.o. female returns for follow up today.  She's here after cycle 2.  She's feeling well today.  She is slightly fatigued, but otherwise has been feeling pretty good.  Her blood sugars have ranged from 100-300 this week.  She continues to use her insulin as prescribed.     MEDICAL HISTORY: Past Medical History  Diagnosis Date  . Hypertension   . Diabetes mellitus   . Hyperkalemia   . IBS (irritable bowel syndrome)   . Hypercholesterolemia   . Cervical radiculopathy   . Anemia     ALLERGIES:  is allergic to amoxicillin; cephalexin; metformin and related; phenergan; and procardia.  MEDICATIONS:  Current Outpatient Prescriptions  Medication Sig Dispense Refill  . Alum & Mag Hydroxide-Simeth (MAGIC MOUTHWASH W/LIDOCAINE) SOLN Take 5 mLs by mouth 4 (four) times daily as needed.  100 mL  0  . amLODipine (NORVASC) 5 MG tablet Take 2.5 mg by mouth every morning.       Marland Kitchen aspirin 81 MG tablet Take 81 mg by mouth at bedtime.       . calcium gluconate 500 MG tablet Take 500 mg by mouth daily.      . ciprofloxacin (CIPRO) 500 MG tablet Take 1 tablet (500 mg total) by mouth 2 (two) times daily.  14 tablet  6  . conjugated estrogens (PREMARIN) vaginal cream Place 0.5 g vaginally once a week. Sunday.      Marland Kitchen dexamethasone (DECADRON) 4 MG tablet Take 2  tablets (8 mg total) by mouth 2 (two) times daily with a meal. Take daily starting the day after chemotherapy for 2 days. Take with food.  30 tablet  1  . glucose blood (FREESTYLE LITE) test strip Test blood sugar QID or as directed by MD.  100 each  11  . insulin lispro (HUMALOG KWIKPEN) 100 UNIT/ML injection Inject 0-6 Units into the skin 3 (three) times daily before meals.  3 mL  12  . Insulin Pen Needle (B-D UF III MINI PEN NEEDLES) 31G X 5 MM MISC 100 Units by Does not apply route 4 (four) times daily.  200 each  6  . Lancets (FREESTYLE) lancets 1 each by Other route 4 (four) times daily. Use as instructed  200 each  12  . lidocaine-prilocaine (EMLA) cream Apply topically as needed.  30 g  4  . LORazepam (ATIVAN) 0.5 MG tablet Take 0.5 mg by mouth every 6 (six) hours as needed. Nausea      . metoprolol (LOPRESSOR) 50 MG tablet Take 50 mg by mouth 2 (two) times daily.      . Omega-3 Fatty Acids (FISH OIL) 1000 MG CAPS Take 1,000 mg by mouth daily.       . ondansetron (ZOFRAN) 8 MG tablet Take 1 tablet (8 mg total) by mouth 2 (two) times daily. Take two times a day starting the day  after chemo for 2 days. Then take two times a day as needed for nausea or vomiting.  30 tablet  1  . prochlorperazine (COMPAZINE) 10 MG tablet Take 1 tablet (10 mg total) by mouth every 6 (six) hours as needed (Nausea or vomiting).  30 tablet  1  . prochlorperazine (COMPAZINE) 25 MG suppository Place 1 suppository (25 mg total) rectally every 12 (twelve) hours as needed for nausea.  12 suppository  3  . simvastatin (ZOCOR) 5 MG tablet Take 5 mg by mouth at bedtime.      . sodium polystyrene (SPS) 15 GM/60ML suspension Take 60 mLs (15 g total) by mouth every 4 (four) hours.  500 mL  0  . sulfamethoxazole-trimethoprim (BACTRIM DS,SEPTRA DS) 800-160 MG per tablet Take 1 tablet by mouth 2 (two) times daily.  10 tablet  0  . [DISCONTINUED] glimepiride (AMARYL) 2 MG tablet Take 1 tablet (2 mg total) by mouth every morning.  30  tablet  11   Current Facility-Administered Medications  Medication Dose Route Frequency Provider Last Rate Last Dose  . TDaP (BOOSTRIX) injection 0.5 mL  0.5 mL Intramuscular Once Meredith Covey, MD       Facility-Administered Medications Ordered in Other Visits  Medication Dose Route Frequency Provider Last Rate Last Dose  . heparin lock flush 100 unit/mL  500 Units Intracatheter Daily PRN Victorino December, MD      . heparin lock flush 100 unit/mL  250 Units Intracatheter PRN Augustin Schooling, NP      . sodium chloride 0.9 % injection 10 mL  10 mL Intracatheter PRN Augustin Schooling, NP        SURGICAL HISTORY:  Past Surgical History  Procedure Date  . Varicose veins     surgey 1959  . Tonsilectomy, adenoidectomy, bilateral myringotomy and tubes   . Esophagogastroduodenoscopy 04/01/2011    Procedure: ESOPHAGOGASTRODUODENOSCOPY (EGD);  Surgeon: Barrie Folk, MD;  Location: [facility] ENDOSCOPY;  Service: Endoscopy;  Laterality: N/A;    REVIEW OF SYSTEMS:   General: fatigue (+), night sweats (-), fever (-), pain (-) Lymph: palpable nodes (-) HEENT: vision changes (-), mucositis (-), gum bleeding (-), epistaxis (-) Cardiovascular: chest pain (-), palpitations (-) Pulmonary: shortness of breath (-), dyspnea on exertion (-), cough (-), hemoptysis (-) GI:  Early satiety (-), melena (-), dysphagia (-), nausea/vomiting (-), diarrhea (-) GU: dysuria (-), hematuria (-), incontinence (-) Musculoskeletal: joint swelling (-), joint pain (-), back pain (-) Neuro: weakness (-), numbness (-), headache (-), confusion (-) Skin: Rash (-), lesions (-), dryness (-) Psych: depression (-), suicidal/homicidal ideation (-), feeling of hopelessness (-)   PHYSICAL EXAMINATION: BP 125/58  Pulse 86  Temp 98.4 F (36.9 C) (Oral)  Resp 20  Ht 5\' 4"  (1.626 m)  Wt 99 lb 12.8 oz (45.269 kg)  BMI 17.13 kg/m2 General: Patient is a well appearing female in no acute distress HEENT: PERRLA, sclerae anicteric no  conjunctival pallor, MMM no erythema or exudate Neck: supple, no lymphadenopathy Lungs: clear to auscultation bilaterally, no wheezes, rhonchi, or rales Cardiovascular: regular rate rhythm, S1, S2, no murmurs, rubs or gallops Abdomen: Soft, non-tender, non-distended, normoactive bowel sounds, no HSM Extremities: warm and well perfused, no clubbing, cyanosis, or edema Skin: No rashes.    Neuro: Non-focal ECOG PERFORMANCE STATUS: 1  LABORATORY DATA: Lab Results  Component Value Date   WBC 1.1* 04/20/2012   HGB 7.2* 04/20/2012   HCT 20.5* 04/20/2012   MCV 85.8 04/20/2012   PLT 41* 04/20/2012  ANC 100    Chemistry      Component Value Date/Time   NA 138 04/20/2012 1015   NA 138 12/18/2011 1018   K 3.9 04/20/2012 1015   K 4.7 12/18/2011 1018   CL 102 04/20/2012 1015   CL 103 12/18/2011 1018   CO2 28 04/20/2012 1015   CO2 30 12/18/2011 1018   BUN 16.0 04/20/2012 1015   BUN 19 12/18/2011 1018   CREATININE 0.7 04/20/2012 1015   CREATININE 0.70 12/18/2011 1018   CREATININE 0.69 07/18/2010 1202   GLU 215* 02/26/2012 1255      Component Value Date/Time   CALCIUM 8.6 04/20/2012 1015   CALCIUM 9.5 12/18/2011 1018   ALKPHOS 46 04/20/2012 1015   ALKPHOS 39 12/18/2011 1018   AST 18 04/20/2012 1015   AST 22 12/18/2011 1018   ALT 35 04/20/2012 1015   ALT 26 12/18/2011 1018   BILITOT 0.88 04/20/2012 1015   BILITOT 0.8 12/18/2011 1018       RADIOGRAPHIC STUDIES:  No results found.  ASSESSMENT: 78 year old female with:  1.  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  2. 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 This is discussed with the daughter and the patient today recommendations were made as below   #3 patient and I and her daughters had an extensive discussion today about the likely transformation to acute leukemia. We discussed the process of  chemotherapy, risks, benefits, and potential adverse effects of the drug.  Through much conversation between myself, the patient, and her children, the patient has decided to proceed with Decitabine chemotherapy.  #4 Skin lesion  #5 neutropenia  #6 Anemia  PLAN:  #1 Ms. Overbaugh is here after her second cycle of Decitabine. She will refill her Cipro today, as she remains neutropenic, I again reviewed in detail neutropenic precautions, and when to call us.  We will also transfuse her with 2 units of PRBCs today for her anemia.    #2 We will keep an eye on the right arm lesion, this is stable.   #3 We will see her back next week for labs.  Her 3rd cycle is scheduled for 05/05/11.  All questions were answered. The patient knows to call the clinic with any problems, questions or concerns. We can certainly see the patient much sooner if necessary.  I spent 25 minutes counseling the patient face to face. The total time spent in the appointment was 30 minutes.  This case was reviewed with Dr. Welton Flakes.    Cherie Ouch Lyn Hollingshead, NP Medical Oncology [facility] Phone: [phone] 04/20/2012, 1:34 PM

## 2012-04-20 NOTE — Patient Instructions (Addendum)
Doing well.  You will receive a blood transfusion today.     Patient Neutropenia Instruction Sheet  Diagnosis: AML    Treating Physician: Drue Second, MD  Treatment: 1. Type of chemotherapy: Decitabine  2. Date of last treatment: 03/30/12  Last Blood Counts: Lab Results  Component Value Date   WBC 1.1* 04/20/2012   HGB 7.2* 04/20/2012   HCT 20.5* 04/20/2012   MCV 85.8 04/20/2012   PLT 41* 04/20/2012  ANC 600      Prophylactic Antibiotics: Cipro 500 mg by mouth twice a day Instructions: 1. Monitor temperature and call if fever  greater than 100.5, chills, shaking chills (rigors) 2. Call Physician on-call at [phone] 3. Give him/her symptoms and list of medications that you are taking and your last blood count.

## 2012-04-21 LAB — TYPE AND SCREEN
Donor AG Type: NEGATIVE
Unit division: 0

## 2012-04-27 ENCOUNTER — Encounter: Payer: Self-pay | Admitting: Adult Health

## 2012-04-27 ENCOUNTER — Other Ambulatory Visit (HOSPITAL_BASED_OUTPATIENT_CLINIC_OR_DEPARTMENT_OTHER): Payer: Medicare Other | Admitting: Lab

## 2012-04-27 ENCOUNTER — Ambulatory Visit (HOSPITAL_BASED_OUTPATIENT_CLINIC_OR_DEPARTMENT_OTHER): Payer: Medicare Other | Admitting: Adult Health

## 2012-04-27 VITALS — BP 124/67 | HR 79 | Temp 98.0°F | Resp 20 | Ht 64.0 in | Wt 102.1 lb

## 2012-04-27 DIAGNOSIS — D649 Anemia, unspecified: Secondary | ICD-10-CM

## 2012-04-27 DIAGNOSIS — D4620 Refractory anemia with excess of blasts, unspecified: Secondary | ICD-10-CM

## 2012-04-27 DIAGNOSIS — C9200 Acute myeloblastic leukemia, not having achieved remission: Secondary | ICD-10-CM

## 2012-04-27 DIAGNOSIS — L989 Disorder of the skin and subcutaneous tissue, unspecified: Secondary | ICD-10-CM

## 2012-04-27 LAB — COMPREHENSIVE METABOLIC PANEL (CC13)
ALT: 23 U/L (ref 0–55)
CO2: 29 mEq/L (ref 22–29)
Calcium: 9 mg/dL (ref 8.4–10.4)
Chloride: 103 mEq/L (ref 98–107)
Creatinine: 0.8 mg/dL (ref 0.6–1.1)
Glucose: 228 mg/dl — ABNORMAL HIGH (ref 70–99)
Total Bilirubin: 0.71 mg/dL (ref 0.20–1.20)

## 2012-04-27 LAB — CBC WITH DIFFERENTIAL/PLATELET
BASO%: 0.9 % (ref 0.0–2.0)
Eosinophils Absolute: 0 10*3/uL (ref 0.0–0.5)
HCT: 28.2 % — ABNORMAL LOW (ref 34.8–46.6)
LYMPH%: 17.9 % (ref 14.0–49.7)
MCHC: 32.4 g/dL (ref 31.5–36.0)
MONO#: 0 10*3/uL — ABNORMAL LOW (ref 0.1–0.9)
NEUT#: 3.4 10*3/uL (ref 1.5–6.5)
NEUT%: 80.6 % — ABNORMAL HIGH (ref 38.4–76.8)
Platelets: 95 10*3/uL — ABNORMAL LOW (ref 145–400)
RBC: 3.19 10*6/uL — ABNORMAL LOW (ref 3.70–5.45)
WBC: 4.3 10*3/uL (ref 3.9–10.3)
lymph#: 0.8 10*3/uL — ABNORMAL LOW (ref 0.9–3.3)

## 2012-04-27 NOTE — Progress Notes (Signed)
OFFICE PROGRESS NOTE  CC  [name], MD [address] [place] [address]  DIAGNOSIS: 78 year old female with severe and profound anemia likely due to a primary bone marrow problem. 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  #2 patient has been on Procrit every 2 weeks unfortunately this has not effective and we will discontinue this.  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  CURRENT THERAPY: Decitabine cycle 2 day 28  INTERVAL HISTORY: JAMIEKA ROYLE 78 y.o. female returns for follow up today. She's doing well today.  She received PRBCs last week and tolerated those very well.  She's less tired since receiving them.  She denies fevers, chills, pain, or any other concerns.    MEDICAL HISTORY: Past Medical History  Diagnosis Date  . Hypertension   . Diabetes mellitus   . Hyperkalemia   . IBS (irritable bowel syndrome)   . Hypercholesterolemia   . Cervical radiculopathy   . Anemia     ALLERGIES:  is allergic to amoxicillin; cephalexin; metformin and related; phenergan; and procardia.  MEDICATIONS:  Current Outpatient Prescriptions  Medication Sig Dispense Refill  . Alum & Mag Hydroxide-Simeth (MAGIC MOUTHWASH W/LIDOCAINE) SOLN Take 5 mLs by mouth 4 (four) times daily as needed.  100 mL  0  . amLODipine (NORVASC) 5 MG tablet Take 2.5 mg by mouth every morning.       Marland Kitchen aspirin 81 MG tablet Take 81 mg by mouth at bedtime.       . calcium gluconate 500 MG tablet Take 500 mg by mouth daily.      Marland Kitchen conjugated estrogens (PREMARIN) vaginal cream Place 0.5 g vaginally once a week. Sunday.      Marland Kitchen dexamethasone (DECADRON) 4 MG tablet Take 2 tablets (8 mg total) by mouth 2 (two) times daily with a meal. Take daily starting the day after chemotherapy for 2 days. Take with food.  30 tablet  1  . glucose  blood (FREESTYLE LITE) test strip Test blood sugar QID or as directed by MD.  100 each  11  . insulin lispro (HUMALOG KWIKPEN) 100 UNIT/ML injection Inject 0-6 Units into the skin 3 (three) times daily before meals.  3 mL  12  . Insulin Pen Needle (B-D UF III MINI PEN NEEDLES) 31G X 5 MM MISC 100 Units by Does not apply route 4 (four) times daily.  200 each  6  . Lancets (FREESTYLE) lancets 1 each by Other route 4 (four) times daily. Use as instructed  200 each  12  . lidocaine-prilocaine (EMLA) cream Apply topically as needed.  30 g  4  . LORazepam (ATIVAN) 0.5 MG tablet Take 0.5 mg by mouth every 6 (six) hours as needed. Nausea      . metoprolol (LOPRESSOR) 50 MG tablet Take 50 mg by mouth 2 (two) times daily.      . Omega-3 Fatty Acids (FISH OIL) 1000 MG CAPS Take 1,000 mg by mouth daily.       . ondansetron (ZOFRAN) 8 MG tablet Take 1 tablet (8 mg total) by mouth 2 (two) times daily. Take two times a day starting the day after chemo for 2 days. Then take two times a day as needed for nausea or vomiting.  30 tablet  1  . prochlorperazine (COMPAZINE) 10 MG tablet Take 1 tablet (10 mg total)  by mouth every 6 (six) hours as needed (Nausea or vomiting).  30 tablet  1  . prochlorperazine (COMPAZINE) 25 MG suppository Place 1 suppository (25 mg total) rectally every 12 (twelve) hours as needed for nausea.  12 suppository  3  . simvastatin (ZOCOR) 5 MG tablet Take 5 mg by mouth at bedtime.      . sodium polystyrene (SPS) 15 GM/60ML suspension Take 60 mLs (15 g total) by mouth every 4 (four) hours.  500 mL  0  . sulfamethoxazole-trimethoprim (BACTRIM DS,SEPTRA DS) 800-160 MG per tablet Take 1 tablet by mouth 2 (two) times daily.  10 tablet  0  . ciprofloxacin (CIPRO) 500 MG tablet Take 1 tablet (500 mg total) by mouth 2 (two) times daily.  14 tablet  6  . [DISCONTINUED] glimepiride (AMARYL) 2 MG tablet Take 1 tablet (2 mg total) by mouth every morning.  30 tablet  11   Current Facility-Administered  Medications  Medication Dose Route Frequency Provider Last Rate Last Dose  . TDaP (BOOSTRIX) injection 0.5 mL  0.5 mL Intramuscular Once [name], MD        SURGICAL HISTORY:  Past Surgical History  Procedure Date  . Varicose veins     surgey 1959  . Tonsilectomy, adenoidectomy, bilateral myringotomy and tubes   . Esophagogastroduodenoscopy 04/01/2011    Procedure: ESOPHAGOGASTRODUODENOSCOPY (EGD);  Surgeon: Barrie Folk, MD;  Location: [facility] ENDOSCOPY;  Service: Endoscopy;  Laterality: N/A;    REVIEW OF SYSTEMS:   General: fatigue (-), night sweats (-), fever (-), pain (-) Lymph: palpable nodes (-) HEENT: vision changes (-), mucositis (-), gum bleeding (-), epistaxis (-) Cardiovascular: chest pain (-), palpitations (-) Pulmonary: shortness of breath (-), dyspnea on exertion (-), cough (-), hemoptysis (-) GI:  Early satiety (-), melena (-), dysphagia (-), nausea/vomiting (-), diarrhea (-) GU: dysuria (-), hematuria (-), incontinence (-) Musculoskeletal: joint swelling (-), joint pain (-), back pain (-) Neuro: weakness (-), numbness (-), headache (-), confusion (-) Skin: Rash (-), lesions (-), dryness (-) Psych: depression (-), suicidal/homicidal ideation (-), feeling of hopelessness (-)   PHYSICAL EXAMINATION: BP 124/67  Pulse 79  Temp 98 F (36.7 C)  Resp 20  Ht 5\' 4"  (1.626 m)  Wt 102 lb 1.6 oz (46.312 kg)  BMI 17.53 kg/m2 General: Patient is a well appearing female in no acute distress HEENT: PERRLA, sclerae anicteric no conjunctival pallor, MMM no erythema or exudate Neck: supple, no lymphadenopathy Lungs: clear to auscultation bilaterally, no wheezes, rhonchi, or rales Cardiovascular: regular rate rhythm, S1, S2, no murmurs, rubs or gallops Abdomen: Soft, non-tender, non-distended, normoactive bowel sounds, no HSM Extremities: warm and well perfused, no clubbing, cyanosis, or edema Skin: No rashes.    Neuro: Non-focal ECOG PERFORMANCE STATUS: 1  LABORATORY  DATA: Lab Results  Component Value Date   WBC 4.3 04/27/2012   HGB 9.1* 04/27/2012   HCT 28.2* 04/27/2012   MCV 88.3 04/27/2012   PLT 95* 04/27/2012  ANC 100    Chemistry      Component Value Date/Time   NA 138 04/20/2012 1015   NA 138 12/18/2011 1018   K 3.9 04/20/2012 1015   K 4.7 12/18/2011 1018   CL 102 04/20/2012 1015   CL 103 12/18/2011 1018   CO2 28 04/20/2012 1015   CO2 30 12/18/2011 1018   BUN 16.0 04/20/2012 1015   BUN 19 12/18/2011 1018   CREATININE 0.7 04/20/2012 1015   CREATININE 0.70 12/18/2011 1018   CREATININE 0.69 07/18/2010  1202   GLU 215* 02/26/2012 1255      Component Value Date/Time   CALCIUM 8.6 04/20/2012 1015   CALCIUM 9.5 12/18/2011 1018   ALKPHOS 46 04/20/2012 1015   ALKPHOS 39 12/18/2011 1018   AST 18 04/20/2012 1015   AST 22 12/18/2011 1018   ALT 35 04/20/2012 1015   ALT 26 12/18/2011 1018   BILITOT 0.88 04/20/2012 1015   BILITOT 0.8 12/18/2011 1018       RADIOGRAPHIC STUDIES:  No results found.  ASSESSMENT: 78 year old female with:  1.  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  2. 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 This is discussed with the daughter and the patient today recommendations were made as below   #3 patient and I and her daughters had an extensive discussion today about the likely transformation to acute leukemia. We discussed the process of chemotherapy, risks, benefits, and potential adverse effects of the drug.  Through much conversation between myself, the patient, and her children, the patient has decided to proceed with Decitabine chemotherapy.  #4 Skin lesion  #5 Anemia  PLAN:  #1 Ms. Nicolaou is here after her second cycle of Decitabine. Her WBC count has completely recovered.  She will stop taking the Cipro.  Her hemoglobin is greatly improved after receiving 2 units of PRBCs.    #2  Right arm lesion is much improved.   #3 We will see her back next week for cycle 4 of Decitabine.   All questions were answered. The patient knows to call the clinic with any problems, questions or concerns. We can certainly see the patient much sooner if necessary.  I spent 25 minutes counseling the patient face to face. The total time spent in the appointment was 30 minutes.  This case was reviewed with Dr. Welton Flakes.    Cherie Ouch Lyn Hollingshead, NP Medical Oncology [facility] Phone: [phone] 04/27/2012, 11:24 AM

## 2012-04-27 NOTE — Patient Instructions (Addendum)
Doing well.  Your lab work looks good.  You can stop taking the antibiotic Cipro.  Please call us if you have any questions or concerns.   We will see you back next week for your 3rd round of Decitabine.

## 2012-05-04 ENCOUNTER — Ambulatory Visit (HOSPITAL_BASED_OUTPATIENT_CLINIC_OR_DEPARTMENT_OTHER): Payer: Medicare Other | Admitting: Adult Health

## 2012-05-04 ENCOUNTER — Other Ambulatory Visit: Payer: Self-pay | Admitting: Adult Health

## 2012-05-04 ENCOUNTER — Ambulatory Visit (HOSPITAL_BASED_OUTPATIENT_CLINIC_OR_DEPARTMENT_OTHER): Payer: Medicare Other | Admitting: Lab

## 2012-05-04 ENCOUNTER — Encounter: Payer: Self-pay | Admitting: Adult Health

## 2012-05-04 ENCOUNTER — Ambulatory Visit (HOSPITAL_BASED_OUTPATIENT_CLINIC_OR_DEPARTMENT_OTHER): Payer: Medicare Other

## 2012-05-04 VITALS — BP 123/58 | HR 87 | Temp 98.3°F | Resp 20 | Ht 64.0 in | Wt 102.5 lb

## 2012-05-04 DIAGNOSIS — C9200 Acute myeloblastic leukemia, not having achieved remission: Secondary | ICD-10-CM

## 2012-05-04 DIAGNOSIS — Z5111 Encounter for antineoplastic chemotherapy: Secondary | ICD-10-CM

## 2012-05-04 LAB — COMPREHENSIVE METABOLIC PANEL (CC13)
ALT: 25 U/L (ref 0–55)
CO2: 30 mEq/L — ABNORMAL HIGH (ref 22–29)
Calcium: 9.5 mg/dL (ref 8.4–10.4)
Chloride: 103 mEq/L (ref 98–107)
Creatinine: 0.7 mg/dL (ref 0.6–1.1)
Glucose: 188 mg/dl — ABNORMAL HIGH (ref 70–99)
Sodium: 140 mEq/L (ref 136–145)
Total Protein: 6.4 g/dL (ref 6.4–8.3)

## 2012-05-04 LAB — CBC WITH DIFFERENTIAL/PLATELET
Basophils Absolute: 0 10*3/uL (ref 0.0–0.1)
HCT: 25.6 % — ABNORMAL LOW (ref 34.8–46.6)
HGB: 8.4 g/dL — ABNORMAL LOW (ref 11.6–15.9)
LYMPH%: 17.1 % (ref 14.0–49.7)
MCHC: 32.8 g/dL (ref 31.5–36.0)
MONO#: 0.2 10*3/uL (ref 0.1–0.9)
NEUT%: 79.9 % — ABNORMAL HIGH (ref 38.4–76.8)
Platelets: 122 10*3/uL — ABNORMAL LOW (ref 145–400)
WBC: 6.8 10*3/uL (ref 3.9–10.3)
lymph#: 1.2 10*3/uL (ref 0.9–3.3)

## 2012-05-04 MED ORDER — SODIUM CHLORIDE 0.9 % IV SOLN
20.0000 mg/m2 | Freq: Once | INTRAVENOUS | Status: AC
  Administered 2012-05-04: 30 mg via INTRAVENOUS
  Filled 2012-05-04: qty 6

## 2012-05-04 MED ORDER — ONDANSETRON 8 MG/50ML IVPB (CHCC)
8.0000 mg | Freq: Once | INTRAVENOUS | Status: AC
  Administered 2012-05-04: 8 mg via INTRAVENOUS

## 2012-05-04 MED ORDER — HEPARIN SOD (PORK) LOCK FLUSH 100 UNIT/ML IV SOLN
500.0000 [IU] | Freq: Once | INTRAVENOUS | Status: AC | PRN
  Administered 2012-05-04: 500 [IU]
  Filled 2012-05-04: qty 5

## 2012-05-04 MED ORDER — SODIUM CHLORIDE 0.9 % IJ SOLN
10.0000 mL | INTRAMUSCULAR | Status: DC | PRN
  Administered 2012-05-04: 10 mL
  Filled 2012-05-04: qty 10

## 2012-05-04 MED ORDER — DEXAMETHASONE SODIUM PHOSPHATE 10 MG/ML IJ SOLN
10.0000 mg | Freq: Once | INTRAMUSCULAR | Status: AC
  Administered 2012-05-04: 10 mg via INTRAVENOUS

## 2012-05-04 MED ORDER — SODIUM CHLORIDE 0.9 % IV SOLN
Freq: Once | INTRAVENOUS | Status: AC
  Administered 2012-05-04: 11:00:00 via INTRAVENOUS

## 2012-05-04 NOTE — Patient Instructions (Signed)
[facility] Discharge Instructions for Patients Receiving Chemotherapy  Today you received the following chemotherapy agents Dacogen.  To help prevent nausea and vomiting after your treatment, we encourage you to take your nausea medication as prescribed.    If you develop nausea and vomiting that is not controlled by your nausea medication, call the clinic. If it is after clinic hours your family physician or the after hours number for the clinic or go to the Emergency Department.   BELOW ARE SYMPTOMS THAT SHOULD BE REPORTED IMMEDIATELY:  *FEVER GREATER THAN 100.5 F  *CHILLS WITH OR WITHOUT FEVER  NAUSEA AND VOMITING THAT IS NOT CONTROLLED WITH YOUR NAUSEA MEDICATION  *UNUSUAL SHORTNESS OF BREATH  *UNUSUAL BRUISING OR BLEEDING  TENDERNESS IN MOUTH AND THROAT WITH OR WITHOUT PRESENCE OF ULCERS  *URINARY PROBLEMS  *BOWEL PROBLEMS  UNUSUAL RASH Items with * indicate a potential emergency and should be followed up as soon as possible.  One of the nurses will contact you 24 hours after your treatment. Please let the nurse know about any problems that you may have experienced. Feel free to call the clinic you have any questions or concerns. The clinic phone number is [phone].

## 2012-05-04 NOTE — Patient Instructions (Addendum)
Doing well.  WE will send you back to lab and then chemo.  Please call us if you have any questions or concerns.

## 2012-05-04 NOTE — Progress Notes (Signed)
OFFICE PROGRESS NOTE  CC  Meredith Rodriguez [address] [place] [address]  DIAGNOSIS: 79 year old female with severe and profound anemia likely due to a primary bone marrow problem. 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  #2 patient has been on Procrit every 2 weeks unfortunately this has not effective and we will discontinue this.  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  CURRENT THERAPY: Decitabine cycle 3 day 1  INTERVAL HISTORY: Meredith Rodriguez 79 y.o. female returns for follow up today. She's doing well today.  Somehow she didn't have her lab appt scheduled today.  Her daughter is with her and is very frustrated with this in addition to when her mother needs transfusions, the amount of time it takes for her to receive those.  Meredith Rodriguez is doing well, she has no fevers, chills, nausea, vomiting or any other concerns.    MEDICAL HISTORY: Past Medical History  Diagnosis Date  . Hypertension   . Diabetes mellitus   . Hyperkalemia   . IBS (irritable bowel syndrome)   . Hypercholesterolemia   . Cervical radiculopathy   . Anemia     ALLERGIES:  is allergic to amoxicillin; cephalexin; metformin and related; phenergan; and procardia.  MEDICATIONS:  Current Outpatient Prescriptions  Medication Sig Dispense Refill  . Alum & Mag Hydroxide-Simeth (MAGIC MOUTHWASH W/LIDOCAINE) SOLN Take 5 mLs by mouth 4 (four) times daily as needed.  100 mL  0  . amLODipine (NORVASC) 5 MG tablet Take 2.5 mg by mouth every morning.       Marland Kitchen aspirin 81 MG tablet Take 81 mg by mouth at bedtime.       . calcium gluconate 500 MG tablet Take 500 mg by mouth daily.      . ciprofloxacin (CIPRO) 500 MG tablet Take 1 tablet (500 mg total) by mouth 2 (two) times daily.  14 tablet  6  . conjugated estrogens (PREMARIN) vaginal  cream Place 0.5 g vaginally once a week. Sunday.      Marland Kitchen dexamethasone (DECADRON) 4 MG tablet Take 2 tablets (8 mg total) by mouth 2 (two) times daily with a meal. Take daily starting the day after chemotherapy for 2 days. Take with food.  30 tablet  1  . glucose blood (FREESTYLE LITE) test strip Test blood sugar QID or as directed by Rodriguez.  100 each  11  . insulin lispro (HUMALOG KWIKPEN) 100 UNIT/ML injection Inject 0-6 Units into the skin 3 (three) times daily before meals.  3 mL  12  . Insulin Pen Needle (B-D UF III MINI PEN NEEDLES) 31G X 5 MM MISC 100 Units by Does not apply route 4 (four) times daily.  200 each  6  . Lancets (FREESTYLE) lancets 1 each by Other route 4 (four) times daily. Use as instructed  200 each  12  . lidocaine-prilocaine (EMLA) cream Apply topically as needed.  30 g  4  . LORazepam (ATIVAN) 0.5 MG tablet Take 0.5 mg by mouth every 6 (six) hours as needed. Nausea      . metoprolol (LOPRESSOR) 50 MG tablet Take 50 mg by mouth 2 (two) times daily.      . Omega-3 Fatty Acids (FISH OIL) 1000 MG CAPS Take 1,000 mg by mouth daily.       . ondansetron (ZOFRAN) 8 MG tablet Take  1 tablet (8 mg total) by mouth 2 (two) times daily. Take two times a day starting the day after chemo for 2 days. Then take two times a day as needed for nausea or vomiting.  30 tablet  1  . prochlorperazine (COMPAZINE) 10 MG tablet Take 1 tablet (10 mg total) by mouth every 6 (six) hours as needed (Nausea or vomiting).  30 tablet  1  . prochlorperazine (COMPAZINE) 25 MG suppository Place 1 suppository (25 mg total) rectally every 12 (twelve) hours as needed for nausea.  12 suppository  3  . simvastatin (ZOCOR) 5 MG tablet Take 5 mg by mouth at bedtime.      . sodium polystyrene (SPS) 15 GM/60ML suspension Take 60 mLs (15 g total) by mouth every 4 (four) hours.  500 mL  0  . sulfamethoxazole-trimethoprim (BACTRIM DS,SEPTRA DS) 800-160 MG per tablet Take 1 tablet by mouth 2 (two) times daily.  10 tablet  0  .  [DISCONTINUED] glimepiride (AMARYL) 2 MG tablet Take 1 tablet (2 mg total) by mouth every morning.  30 tablet  11   Current Facility-Administered Medications  Medication Dose Route Frequency Provider Last Rate Last Dose  . TDaP (BOOSTRIX) injection 0.5 mL  0.5 mL Intramuscular Once Meredith Rodriguez        SURGICAL HISTORY:  Past Surgical History  Procedure Date  . Varicose veins     surgey 1959  . Tonsilectomy, adenoidectomy, bilateral myringotomy and tubes   . Esophagogastroduodenoscopy 04/01/2011    Procedure: ESOPHAGOGASTRODUODENOSCOPY (EGD);  Surgeon: Barrie Folk, Rodriguez;  Location: [facility] ENDOSCOPY;  Service: Endoscopy;  Laterality: N/A;    REVIEW OF SYSTEMS:   General: fatigue (-), night sweats (-), fever (-), pain (-) Lymph: palpable nodes (-) HEENT: vision changes (-), mucositis (-), gum bleeding (-), epistaxis (-) Cardiovascular: chest pain (-), palpitations (-) Pulmonary: shortness of breath (-), dyspnea on exertion (-), cough (-), hemoptysis (-) GI:  Early satiety (-), melena (-), dysphagia (-), nausea/vomiting (-), diarrhea (-) GU: dysuria (-), hematuria (-), incontinence (-) Musculoskeletal: joint swelling (-), joint pain (-), back pain (-) Neuro: weakness (-), numbness (-), headache (-), confusion (-) Skin: Rash (-), lesions (-), dryness (-) Psych: depression (-), suicidal/homicidal ideation (-), feeling of hopelessness (-)   PHYSICAL EXAMINATION: BP 123/58  Pulse 87  Temp 98.3 F (36.8 C)  Resp 20  Ht 5\' 4"  (1.626 m)  Wt 102 lb 8 oz (46.494 kg)  BMI 17.59 kg/m2 General: Patient is a well appearing female in no acute distress HEENT: PERRLA, sclerae anicteric no conjunctival pallor, MMM no erythema or exudate Neck: supple, no lymphadenopathy Lungs: clear to auscultation bilaterally, no wheezes, rhonchi, or rales Cardiovascular: regular rate rhythm, S1, S2, no murmurs, rubs or gallops Abdomen: Soft, non-tender, non-distended, normoactive bowel sounds, no  HSM Extremities: warm and well perfused, no clubbing, cyanosis, or edema Skin: No rashes.    Neuro: Non-focal ECOG PERFORMANCE STATUS: 1  LABORATORY DATA: Lab Results  Component Value Date   WBC 4.3 04/27/2012   HGB 9.1* 04/27/2012   HCT 28.2* 04/27/2012   MCV 88.3 04/27/2012   PLT 95* 04/27/2012      Chemistry      Component Value Date/Time   NA 139 04/27/2012 1040   NA 138 12/18/2011 1018   K 4.2 04/27/2012 1040   K 4.7 12/18/2011 1018   CL 103 04/27/2012 1040   CL 103 12/18/2011 1018   CO2 29 04/27/2012 1040   CO2 30 12/18/2011 1018  BUN 15.0 04/27/2012 1040   BUN 19 12/18/2011 1018   CREATININE 0.8 04/27/2012 1040   CREATININE 0.70 12/18/2011 1018   CREATININE 0.69 07/18/2010 1202   GLU 215* 02/26/2012 1255      Component Value Date/Time   CALCIUM 9.0 04/27/2012 1040   CALCIUM 9.5 12/18/2011 1018   ALKPHOS 42 04/27/2012 1040   ALKPHOS 39 12/18/2011 1018   AST 20 04/27/2012 1040   AST 22 12/18/2011 1018   ALT 23 04/27/2012 1040   ALT 26 12/18/2011 1018   BILITOT 0.71 04/27/2012 1040   BILITOT 0.8 12/18/2011 1018       RADIOGRAPHIC STUDIES:  No results found.  ASSESSMENT: 79 year old female with:  1.  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  2. 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 This is discussed with the daughter and the patient today recommendations were made as below   #3 patient and I and her daughters had an extensive discussion today about the likely transformation to acute leukemia. We discussed the process of chemotherapy, risks, benefits, and potential adverse effects of the drug.  Through much conversation between myself, the patient, and her children, the patient has decided to proceed with Decitabine chemotherapy.  #4 Skin lesion  #5 Anemia  PLAN:  #1 Ms. Down is here for her evaluation for her third cycle of  chemotherapy.    #2 Right arm lesion has healed.  #3 We will see her back next week for labs.  All questions were answered. The patient knows to call the clinic with any problems, questions or concerns. We can certainly see the patient much sooner if necessary.  I spent 25 minutes counseling the patient face to face. The total time spent in the appointment was 30 minutes.  This case was reviewed with Dr. Welton Flakes.    Cherie Ouch Lyn Hollingshead, NP Medical Oncology [facility] Phone: [phone] 05/04/2012, 9:47 AM

## 2012-05-05 ENCOUNTER — Ambulatory Visit (HOSPITAL_BASED_OUTPATIENT_CLINIC_OR_DEPARTMENT_OTHER): Payer: Medicare Other

## 2012-05-05 ENCOUNTER — Telehealth: Payer: Self-pay | Admitting: Oncology

## 2012-05-05 VITALS — BP 167/54 | HR 72 | Temp 97.6°F

## 2012-05-05 DIAGNOSIS — Z5111 Encounter for antineoplastic chemotherapy: Secondary | ICD-10-CM

## 2012-05-05 DIAGNOSIS — C9200 Acute myeloblastic leukemia, not having achieved remission: Secondary | ICD-10-CM

## 2012-05-05 MED ORDER — SODIUM CHLORIDE 0.9 % IJ SOLN
10.0000 mL | INTRAMUSCULAR | Status: DC | PRN
Start: 2012-05-05 — End: 2012-05-05
  Administered 2012-05-05: 10 mL
  Filled 2012-05-05: qty 10

## 2012-05-05 MED ORDER — HEPARIN SOD (PORK) LOCK FLUSH 100 UNIT/ML IV SOLN
500.0000 [IU] | Freq: Once | INTRAVENOUS | Status: AC | PRN
  Administered 2012-05-05: 500 [IU]
  Filled 2012-05-05: qty 5

## 2012-05-05 MED ORDER — SODIUM CHLORIDE 0.9 % IV SOLN
Freq: Once | INTRAVENOUS | Status: AC
  Administered 2012-05-05: 10:00:00 via INTRAVENOUS

## 2012-05-05 MED ORDER — DEXAMETHASONE SODIUM PHOSPHATE 10 MG/ML IJ SOLN
10.0000 mg | Freq: Once | INTRAMUSCULAR | Status: AC
  Administered 2012-05-05: 10 mg via INTRAVENOUS

## 2012-05-05 MED ORDER — ONDANSETRON 8 MG/50ML IVPB (CHCC)
8.0000 mg | Freq: Once | INTRAVENOUS | Status: AC
  Administered 2012-05-05: 8 mg via INTRAVENOUS

## 2012-05-05 MED ORDER — SODIUM CHLORIDE 0.9 % IV SOLN
20.0000 mg/m2 | Freq: Once | INTRAVENOUS | Status: AC
  Administered 2012-05-05: 30 mg via INTRAVENOUS
  Filled 2012-05-05: qty 6

## 2012-05-05 NOTE — Telephone Encounter (Signed)
Per 1/6 pof moved f/u from AB to [place]. S/w pt she is aware of new time for 8:15am on 1/20.

## 2012-05-05 NOTE — Patient Instructions (Addendum)
[facility] Discharge Instructions for Patients Receiving Chemotherapy  Today you received the following chemotherapy agents Dacogen.  To help prevent nausea and vomiting after your treatment, we encourage you to take your nausea medication as prescribed.   If you develop nausea and vomiting that is not controlled by your nausea medication, call the clinic. If it is after clinic hours your family physician or the after hours number for the clinic or go to the Emergency Department.   BELOW ARE SYMPTOMS THAT SHOULD BE REPORTED IMMEDIATELY:  *FEVER GREATER THAN 100.5 F  *CHILLS WITH OR WITHOUT FEVER  NAUSEA AND VOMITING THAT IS NOT CONTROLLED WITH YOUR NAUSEA MEDICATION  *UNUSUAL SHORTNESS OF BREATH  *UNUSUAL BRUISING OR BLEEDING  TENDERNESS IN MOUTH AND THROAT WITH OR WITHOUT PRESENCE OF ULCERS  *URINARY PROBLEMS  *BOWEL PROBLEMS  UNUSUAL RASH Items with * indicate a potential emergency and should be followed up as soon as possible.  Feel free to call the clinic you have any questions or concerns. The clinic phone number is [phone].   I have been informed and understand all the instructions given to me. I know to contact the clinic, my physician, or go to the Emergency Department if any problems should occur. I do not have any questions at this time, but understand that I may call the clinic during office hours   should I have any questions or need assistance in obtaining follow up care.    __________________________________________  _____________  __________ Signature of Patient or Authorized Representative            Date                   Time    __________________________________________ Nurse's Signature    

## 2012-05-06 ENCOUNTER — Ambulatory Visit (HOSPITAL_BASED_OUTPATIENT_CLINIC_OR_DEPARTMENT_OTHER): Payer: Medicare Other

## 2012-05-06 VITALS — BP 143/59 | HR 79 | Temp 98.0°F

## 2012-05-06 DIAGNOSIS — Z5111 Encounter for antineoplastic chemotherapy: Secondary | ICD-10-CM

## 2012-05-06 DIAGNOSIS — C9200 Acute myeloblastic leukemia, not having achieved remission: Secondary | ICD-10-CM

## 2012-05-06 MED ORDER — SODIUM CHLORIDE 0.9 % IJ SOLN
10.0000 mL | INTRAMUSCULAR | Status: DC | PRN
  Administered 2012-05-06: 10 mL
  Filled 2012-05-06: qty 10

## 2012-05-06 MED ORDER — DEXAMETHASONE SODIUM PHOSPHATE 10 MG/ML IJ SOLN
10.0000 mg | Freq: Once | INTRAMUSCULAR | Status: AC
  Administered 2012-05-06: 10 mg via INTRAVENOUS

## 2012-05-06 MED ORDER — SODIUM CHLORIDE 0.9 % IV SOLN
20.0000 mg/m2 | Freq: Once | INTRAVENOUS | Status: AC
  Administered 2012-05-06: 30 mg via INTRAVENOUS
  Filled 2012-05-06: qty 6

## 2012-05-06 MED ORDER — SODIUM CHLORIDE 0.9 % IV SOLN
Freq: Once | INTRAVENOUS | Status: AC
  Administered 2012-05-06: 10:00:00 via INTRAVENOUS

## 2012-05-06 MED ORDER — ONDANSETRON 8 MG/50ML IVPB (CHCC)
8.0000 mg | Freq: Once | INTRAVENOUS | Status: AC
  Administered 2012-05-06: 8 mg via INTRAVENOUS

## 2012-05-06 MED ORDER — HEPARIN SOD (PORK) LOCK FLUSH 100 UNIT/ML IV SOLN
500.0000 [IU] | Freq: Once | INTRAVENOUS | Status: AC | PRN
  Administered 2012-05-06: 500 [IU]
  Filled 2012-05-06: qty 5

## 2012-05-06 NOTE — Patient Instructions (Addendum)
[facility] Discharge Instructions for Patients Receiving Chemotherapy  Today you received the following chemotherapy agents Dacogen.  To help prevent nausea and vomiting after your treatment, we encourage you to take your nausea medication as prescribed.   If you develop nausea and vomiting that is not controlled by your nausea medication, call the clinic. If it is after clinic hours your family physician or the after hours number for the clinic or go to the Emergency Department.   BELOW ARE SYMPTOMS THAT SHOULD BE REPORTED IMMEDIATELY:  *FEVER GREATER THAN 100.5 F  *CHILLS WITH OR WITHOUT FEVER  NAUSEA AND VOMITING THAT IS NOT CONTROLLED WITH YOUR NAUSEA MEDICATION  *UNUSUAL SHORTNESS OF BREATH  *UNUSUAL BRUISING OR BLEEDING  TENDERNESS IN MOUTH AND THROAT WITH OR WITHOUT PRESENCE OF ULCERS  *URINARY PROBLEMS  *BOWEL PROBLEMS  UNUSUAL RASH Items with * indicate a potential emergency and should be followed up as soon as possible.  Feel free to call the clinic you have any questions or concerns. The clinic phone number is [phone].   I have been informed and understand all the instructions given to me. I know to contact the clinic, my physician, or go to the Emergency Department if any problems should occur. I do not have any questions at this time, but understand that I may call the clinic during office hours   should I have any questions or need assistance in obtaining follow up care.    __________________________________________  _____________  __________ Signature of Patient or Authorized Representative            Date                   Time    __________________________________________ Nurse's Signature    

## 2012-05-07 ENCOUNTER — Ambulatory Visit (HOSPITAL_BASED_OUTPATIENT_CLINIC_OR_DEPARTMENT_OTHER): Payer: Medicare Other

## 2012-05-07 VITALS — BP 182/68 | HR 88 | Temp 97.2°F

## 2012-05-07 DIAGNOSIS — C9200 Acute myeloblastic leukemia, not having achieved remission: Secondary | ICD-10-CM

## 2012-05-07 DIAGNOSIS — Z5111 Encounter for antineoplastic chemotherapy: Secondary | ICD-10-CM

## 2012-05-07 MED ORDER — SODIUM CHLORIDE 0.9 % IV SOLN
Freq: Once | INTRAVENOUS | Status: AC
  Administered 2012-05-07: 10:00:00 via INTRAVENOUS

## 2012-05-07 MED ORDER — SODIUM CHLORIDE 0.9 % IV SOLN
20.0000 mg/m2 | Freq: Once | INTRAVENOUS | Status: AC
  Administered 2012-05-07: 30 mg via INTRAVENOUS
  Filled 2012-05-07: qty 6

## 2012-05-07 MED ORDER — ONDANSETRON 8 MG/50ML IVPB (CHCC)
8.0000 mg | Freq: Once | INTRAVENOUS | Status: AC
  Administered 2012-05-07: 8 mg via INTRAVENOUS

## 2012-05-07 MED ORDER — SODIUM CHLORIDE 0.9 % IJ SOLN
10.0000 mL | INTRAMUSCULAR | Status: DC | PRN
  Administered 2012-05-07: 10 mL
  Filled 2012-05-07: qty 10

## 2012-05-07 MED ORDER — HEPARIN SOD (PORK) LOCK FLUSH 100 UNIT/ML IV SOLN
500.0000 [IU] | Freq: Once | INTRAVENOUS | Status: AC | PRN
  Administered 2012-05-07: 500 [IU]
  Filled 2012-05-07: qty 5

## 2012-05-07 MED ORDER — DEXAMETHASONE SODIUM PHOSPHATE 10 MG/ML IJ SOLN
10.0000 mg | Freq: Once | INTRAMUSCULAR | Status: AC
  Administered 2012-05-07: 10 mg via INTRAVENOUS

## 2012-05-07 NOTE — Patient Instructions (Signed)
[facility] Discharge Instructions for Patients Receiving Chemotherapy  Today you received the following chemotherapy agents Dacogen.  To help prevent nausea and vomiting after your treatment, we encourage you to take your nausea medication as ordered per MD.    If you develop nausea and vomiting that is not controlled by your nausea medication, call the clinic. If it is after clinic hours your family physician or the after hours number for the clinic or go to the Emergency Department.   BELOW ARE SYMPTOMS THAT SHOULD BE REPORTED IMMEDIATELY:  *FEVER GREATER THAN 100.5 F  *CHILLS WITH OR WITHOUT FEVER  NAUSEA AND VOMITING THAT IS NOT CONTROLLED WITH YOUR NAUSEA MEDICATION  *UNUSUAL SHORTNESS OF BREATH  *UNUSUAL BRUISING OR BLEEDING  TENDERNESS IN MOUTH AND THROAT WITH OR WITHOUT PRESENCE OF ULCERS  *URINARY PROBLEMS  *BOWEL PROBLEMS  UNUSUAL RASH Items with * indicate a potential emergency and should be followed up as soon as possible.   Please let the nurse know about any problems that you may have experienced. Feel free to call the clinic you have any questions or concerns. The clinic phone number is [phone].   I have been informed and understand all the instructions given to me. I know to contact the clinic, my physician, or go to the Emergency Department if any problems should occur. I do not have any questions at this time, but understand that I may call the clinic during office hours   should I have any questions or need assistance in obtaining follow up care.    __________________________________________  _____________  __________ Signature of Patient or Authorized Representative            Date                   Time    __________________________________________ Nurse's Signature

## 2012-05-08 ENCOUNTER — Ambulatory Visit (HOSPITAL_BASED_OUTPATIENT_CLINIC_OR_DEPARTMENT_OTHER): Payer: Medicare Other

## 2012-05-08 VITALS — BP 159/62 | HR 79 | Temp 97.6°F | Resp 20

## 2012-05-08 DIAGNOSIS — C9200 Acute myeloblastic leukemia, not having achieved remission: Secondary | ICD-10-CM

## 2012-05-08 DIAGNOSIS — Z5111 Encounter for antineoplastic chemotherapy: Secondary | ICD-10-CM

## 2012-05-08 MED ORDER — SODIUM CHLORIDE 0.9 % IJ SOLN
10.0000 mL | INTRAMUSCULAR | Status: DC | PRN
  Administered 2012-05-08: 10 mL
  Filled 2012-05-08: qty 10

## 2012-05-08 MED ORDER — SODIUM CHLORIDE 0.9 % IV SOLN
Freq: Once | INTRAVENOUS | Status: AC
  Administered 2012-05-08: 10:00:00 via INTRAVENOUS

## 2012-05-08 MED ORDER — DEXAMETHASONE SODIUM PHOSPHATE 10 MG/ML IJ SOLN
10.0000 mg | Freq: Once | INTRAMUSCULAR | Status: DC
Start: 2012-05-08 — End: 2012-05-08

## 2012-05-08 MED ORDER — SODIUM CHLORIDE 0.9 % IV SOLN
20.0000 mg/m2 | Freq: Once | INTRAVENOUS | Status: AC
  Administered 2012-05-08: 30 mg via INTRAVENOUS
  Filled 2012-05-08: qty 6

## 2012-05-08 MED ORDER — HEPARIN SOD (PORK) LOCK FLUSH 100 UNIT/ML IV SOLN
500.0000 [IU] | Freq: Once | INTRAVENOUS | Status: AC | PRN
  Administered 2012-05-08: 500 [IU]
  Filled 2012-05-08: qty 5

## 2012-05-08 MED ORDER — ONDANSETRON 8 MG/50ML IVPB (CHCC)
8.0000 mg | Freq: Once | INTRAVENOUS | Status: AC
  Administered 2012-05-08: 8 mg via INTRAVENOUS

## 2012-05-08 NOTE — Patient Instructions (Addendum)
Patient aware of next appointment; discharged home with no complaints. 

## 2012-05-11 ENCOUNTER — Encounter: Payer: Self-pay | Admitting: Adult Health

## 2012-05-11 ENCOUNTER — Telehealth: Payer: Self-pay | Admitting: *Deleted

## 2012-05-11 ENCOUNTER — Encounter (HOSPITAL_COMMUNITY)
Admission: RE | Admit: 2012-05-11 | Discharge: 2012-05-11 | Disposition: A | Discharge status: Home / Self Care | Payer: Medicare Other | Source: Ambulatory Visit | Attending: Oncology | Admitting: Oncology

## 2012-05-11 ENCOUNTER — Other Ambulatory Visit (HOSPITAL_BASED_OUTPATIENT_CLINIC_OR_DEPARTMENT_OTHER): Payer: Medicare Other | Admitting: Lab

## 2012-05-11 ENCOUNTER — Ambulatory Visit (HOSPITAL_BASED_OUTPATIENT_CLINIC_OR_DEPARTMENT_OTHER): Payer: Medicare Other | Admitting: Adult Health

## 2012-05-11 VITALS — BP 137/63 | HR 87 | Temp 97.8°F | Resp 20 | Ht 64.0 in | Wt 100.6 lb

## 2012-05-11 DIAGNOSIS — C9200 Acute myeloblastic leukemia, not having achieved remission: Secondary | ICD-10-CM | POA: Insufficient documentation

## 2012-05-11 DIAGNOSIS — D4620 Refractory anemia with excess of blasts, unspecified: Secondary | ICD-10-CM

## 2012-05-11 DIAGNOSIS — D649 Anemia, unspecified: Secondary | ICD-10-CM

## 2012-05-11 DIAGNOSIS — D469 Myelodysplastic syndrome, unspecified: Secondary | ICD-10-CM | POA: Insufficient documentation

## 2012-05-11 DIAGNOSIS — D696 Thrombocytopenia, unspecified: Secondary | ICD-10-CM | POA: Insufficient documentation

## 2012-05-11 DIAGNOSIS — H538 Other visual disturbances: Secondary | ICD-10-CM

## 2012-05-11 LAB — CBC WITH DIFFERENTIAL/PLATELET
BASO%: 0 % (ref 0.0–2.0)
EOS%: 0 % (ref 0.0–7.0)
MCH: 29.9 pg (ref 25.1–34.0)
MCHC: 34.7 g/dL (ref 31.5–36.0)
MONO#: 0 10*3/uL — ABNORMAL LOW (ref 0.1–0.9)
RBC: 2.88 10*6/uL — ABNORMAL LOW (ref 3.70–5.45)
WBC: 4.7 10*3/uL (ref 3.9–10.3)
lymph#: 0.4 10*3/uL — ABNORMAL LOW (ref 0.9–3.3)

## 2012-05-11 LAB — COMPREHENSIVE METABOLIC PANEL (CC13)
ALT: 24 U/L (ref 0–55)
AST: 15 U/L (ref 5–34)
CO2: 27 mEq/L (ref 22–29)
Calcium: 9.2 mg/dL (ref 8.4–10.4)
Chloride: 99 mEq/L (ref 98–107)
Sodium: 132 mEq/L — ABNORMAL LOW (ref 136–145)
Total Bilirubin: 0.65 mg/dL (ref 0.20–1.20)
Total Protein: 6.2 g/dL — ABNORMAL LOW (ref 6.4–8.3)

## 2012-05-11 LAB — HOLD TUBE, BLOOD BANK

## 2012-05-11 NOTE — Patient Instructions (Addendum)
Doing well.  Labs look good.  WE will see you back next week.  Please call if you feel more fatigued or short of breath before the weekend.

## 2012-05-11 NOTE — Progress Notes (Signed)
OFFICE PROGRESS NOTE  CC  [name], MD [address] [place] [address]  DIAGNOSIS: 79 year old female with severe and profound anemia likely due to a primary bone marrow problem. 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  #2 patient has been on Procrit every 2 weeks unfortunately this has not effective and we will discontinue this.  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  CURRENT THERAPY: Decitabine cycle 3 day 8  INTERVAL HISTORY: Meredith Rodriguez 79 y.o. female returns for follow up today. She's doing well today. She has some mild blurred vision that improves when putting on her glasses, She is slightly fatigued, however is otherwise without fevers, chills, nausea, vomiting, or any other concerns.  Her blood sugars have been in the 300s, however, she continues to take her correction insulin for this and is tolerating it well.  Otherwise, a 10 point ros is neg.   MEDICAL HISTORY: Past Medical History  Diagnosis Date  . Hypertension   . Diabetes mellitus   . Hyperkalemia   . IBS (irritable bowel syndrome)   . Hypercholesterolemia   . Cervical radiculopathy   . Anemia     ALLERGIES:  is allergic to amoxicillin; cephalexin; metformin and related; phenergan; and procardia.  MEDICATIONS:  Current Outpatient Prescriptions  Medication Sig Dispense Refill  . Alum & Mag Hydroxide-Simeth (MAGIC MOUTHWASH W/LIDOCAINE) SOLN Take 5 mLs by mouth 4 (four) times daily as needed.  100 mL  0  . amLODipine (NORVASC) 5 MG tablet Take 2.5 mg by mouth every morning.       Marland Kitchen aspirin 81 MG tablet Take 81 mg by mouth at bedtime.       . calcium gluconate 500 MG tablet Take 500 mg by mouth daily.      Marland Kitchen conjugated estrogens (PREMARIN) vaginal cream Place 0.5 g vaginally once a week. Sunday.      Marland Kitchen dexamethasone  (DECADRON) 4 MG tablet Take 2 tablets (8 mg total) by mouth 2 (two) times daily with a meal. Take daily starting the day after chemotherapy for 2 days. Take with food.  30 tablet  1  . glucose blood (FREESTYLE LITE) test strip Test blood sugar QID or as directed by MD.  100 each  11  . insulin lispro (HUMALOG KWIKPEN) 100 UNIT/ML injection Inject 0-6 Units into the skin 3 (three) times daily before meals.  3 mL  12  . Insulin Pen Needle (B-D UF III MINI PEN NEEDLES) 31G X 5 MM MISC 100 Units by Does not apply route 4 (four) times daily.  200 each  6  . Lancets (FREESTYLE) lancets 1 each by Other route 4 (four) times daily. Use as instructed  200 each  12  . lidocaine-prilocaine (EMLA) cream Apply topically as needed.  30 g  4  . LORazepam (ATIVAN) 0.5 MG tablet Take 0.5 mg by mouth every 6 (six) hours as needed. Nausea      . metoprolol (LOPRESSOR) 50 MG tablet Take 50 mg by mouth 2 (two) times daily.      . Omega-3 Fatty Acids (FISH OIL) 1000 MG CAPS Take 1,000 mg by mouth daily.       . ondansetron (ZOFRAN) 8 MG tablet Take 1 tablet (8 mg total) by mouth 2 (two) times daily. Take two times a day starting the day after chemo for  2 days. Then take two times a day as needed for nausea or vomiting.  30 tablet  1  . prochlorperazine (COMPAZINE) 10 MG tablet Take 1 tablet (10 mg total) by mouth every 6 (six) hours as needed (Nausea or vomiting).  30 tablet  1  . prochlorperazine (COMPAZINE) 25 MG suppository Place 1 suppository (25 mg total) rectally every 12 (twelve) hours as needed for nausea.  12 suppository  3  . simvastatin (ZOCOR) 5 MG tablet Take 5 mg by mouth at bedtime.      . sodium polystyrene (SPS) 15 GM/60ML suspension Take 60 mLs (15 g total) by mouth every 4 (four) hours.  500 mL  0  . sulfamethoxazole-trimethoprim (BACTRIM DS,SEPTRA DS) 800-160 MG per tablet Take 1 tablet by mouth 2 (two) times daily.  10 tablet  0  . ciprofloxacin (CIPRO) 500 MG tablet Take 1 tablet (500 mg total) by  mouth 2 (two) times daily.  14 tablet  6  . [DISCONTINUED] glimepiride (AMARYL) 2 MG tablet Take 1 tablet (2 mg total) by mouth every morning.  30 tablet  11   Current Facility-Administered Medications  Medication Dose Route Frequency Provider Last Rate Last Dose  . TDaP (BOOSTRIX) injection 0.5 mL  0.5 mL Intramuscular Once [name], MD        SURGICAL HISTORY:  Past Surgical History  Procedure Date  . Varicose veins     surgey 1959  . Tonsilectomy, adenoidectomy, bilateral myringotomy and tubes   . Esophagogastroduodenoscopy 04/01/2011    Procedure: ESOPHAGOGASTRODUODENOSCOPY (EGD);  Surgeon: Barrie Folk, MD;  Location: [facility] ENDOSCOPY;  Service: Endoscopy;  Laterality: N/A;    REVIEW OF SYSTEMS:   General: fatigue (+), night sweats (-), fever (-), pain (-) Lymph: palpable nodes (-) HEENT: vision changes (-), mucositis (-), gum bleeding (-), epistaxis (-) Cardiovascular: chest pain (-), palpitations (-) Pulmonary: shortness of breath (-), dyspnea on exertion (-), cough (-), hemoptysis (-) GI:  Early satiety (-), melena (-), dysphagia (-), nausea/vomiting (-), diarrhea (-) GU: dysuria (-), hematuria (-), incontinence (-) Musculoskeletal: joint swelling (-), joint pain (-), back pain (-) Neuro: weakness (-), numbness (-), headache (-), confusion (-) Skin: Rash (-), lesions (-), dryness (-) Psych: depression (-), suicidal/homicidal ideation (-), feeling of hopelessness (-)   PHYSICAL EXAMINATION: BP 137/63  Pulse 87  Temp 97.8 F (36.6 C)  Resp 20  Ht 5\' 4"  (1.626 m)  Wt 100 lb 9.6 oz (45.632 kg)  BMI 17.27 kg/m2 General: Patient is a well appearing female in no acute distress HEENT: PERRLA, sclerae anicteric no conjunctival pallor, MMM no erythema or exudate Neck: supple, no lymphadenopathy Lungs: clear to auscultation bilaterally, no wheezes, rhonchi, or rales Cardiovascular: regular rate rhythm, S1, S2, no murmurs, rubs or gallops Abdomen: Soft, non-tender,  non-distended, normoactive bowel sounds, no HSM Extremities: warm and well perfused, no clubbing, cyanosis, or edema Skin: No rashes.    Neuro: Non-focal ECOG PERFORMANCE STATUS: 1  LABORATORY DATA: Lab Results  Component Value Date   WBC 4.7 05/11/2012   HGB 8.6* 05/11/2012   HCT 24.8* 05/11/2012   MCV 86.2 05/11/2012   PLT 65* 05/11/2012      Chemistry      Component Value Date/Time   NA 132* 05/11/2012 1012   NA 138 12/18/2011 1018   K 4.1 05/11/2012 1012   K 4.7 12/18/2011 1018   CL 99 05/11/2012 1012   CL 103 12/18/2011 1018   CO2 27 05/11/2012 1012   CO2 30  12/18/2011 1018   BUN 32.0* 05/11/2012 1012   BUN 19 12/18/2011 1018   CREATININE 0.9 05/11/2012 1012   CREATININE 0.70 12/18/2011 1018   CREATININE 0.69 07/18/2010 1202   GLU 215* 02/26/2012 1255      Component Value Date/Time   CALCIUM 9.2 05/11/2012 1012   CALCIUM 9.5 12/18/2011 1018   ALKPHOS 68 05/11/2012 1012   ALKPHOS 39 12/18/2011 1018   AST 15 05/11/2012 1012   AST 22 12/18/2011 1018   ALT 24 05/11/2012 1012   ALT 26 12/18/2011 1018   BILITOT 0.65 05/11/2012 1012   BILITOT 0.8 12/18/2011 1018       RADIOGRAPHIC STUDIES:  No results found.  ASSESSMENT: 79 year old female with:  1.  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  2. 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 This is discussed with the daughter and the patient today recommendations were made as below   #3 patient and I and her daughters had an extensive discussion today about the likely transformation to acute leukemia. We discussed the process of chemotherapy, risks, benefits, and potential adverse effects of the drug.  Through much conversation between myself, the patient, and her children, the patient has decided to proceed with Decitabine chemotherapy.  #4 Blurred vision  #5 Anemia  PLAN:  #1 Ms. Wahba is here for her  evaluation after her third cycle of the Decitabine.  She tolerated her treatment well and continues on correction insulin for her steroid induced hyperglycemia.  She isn't neutropenic or thrombocytopenic today.    #2 Her blurred vision is likely related to the steroids, should it not improve by next week, or should it worsen this week we will send her to opthalmology.    #3 We will see her back next week for labs and an appt.  I've arranged a blood transfusion as well next week.  She knows to call should she have worsening fatigue or shortness of breath before the weekend so we can get her in for blood sooner.    All questions were answered. The patient knows to call the clinic with any problems, questions or concerns. We can certainly see the patient much sooner if necessary.  I spent 25 minutes counseling the patient face to face. The total time spent in the appointment was 30 minutes.  This case was reviewed with Dr. Welton Flakes.    Cherie Ouch Lyn Hollingshead, NP Medical Oncology [facility] Phone: [phone] 05/11/2012, 1:03 PM

## 2012-05-11 NOTE — Telephone Encounter (Signed)
blood transfusion on 05/18/12 after NP appt., 06/01/12, Decitabine start  Pt aware is of the appointment

## 2012-05-11 NOTE — Telephone Encounter (Signed)
Per staff message and POF I have scheduled appts.  JMW  

## 2012-05-17 ENCOUNTER — Emergency Department (HOSPITAL_COMMUNITY): Payer: Medicare Other

## 2012-05-17 ENCOUNTER — Inpatient Hospital Stay (HOSPITAL_COMMUNITY)
Admission: EM | Admit: 2012-05-17 | Discharge: 2012-05-26 | DRG: 193 | Disposition: A | Discharge status: Home / Self Care | Payer: Medicare Other | Attending: Internal Medicine | Admitting: Internal Medicine

## 2012-05-17 ENCOUNTER — Encounter (HOSPITAL_COMMUNITY): Payer: Self-pay | Admitting: Emergency Medicine

## 2012-05-17 DIAGNOSIS — R5081 Fever presenting with conditions classified elsewhere: Secondary | ICD-10-CM

## 2012-05-17 DIAGNOSIS — I10 Essential (primary) hypertension: Secondary | ICD-10-CM | POA: Diagnosis present

## 2012-05-17 DIAGNOSIS — D696 Thrombocytopenia, unspecified: Secondary | ICD-10-CM | POA: Diagnosis present

## 2012-05-17 DIAGNOSIS — H538 Other visual disturbances: Secondary | ICD-10-CM | POA: Diagnosis present

## 2012-05-17 DIAGNOSIS — D61818 Other pancytopenia: Secondary | ICD-10-CM | POA: Diagnosis present

## 2012-05-17 DIAGNOSIS — E785 Hyperlipidemia, unspecified: Secondary | ICD-10-CM | POA: Diagnosis present

## 2012-05-17 DIAGNOSIS — Z681 Body mass index (BMI) 19 or less, adult: Secondary | ICD-10-CM

## 2012-05-17 DIAGNOSIS — E43 Unspecified severe protein-calorie malnutrition: Secondary | ICD-10-CM | POA: Diagnosis present

## 2012-05-17 DIAGNOSIS — K5900 Constipation, unspecified: Secondary | ICD-10-CM | POA: Diagnosis present

## 2012-05-17 DIAGNOSIS — R636 Underweight: Secondary | ICD-10-CM | POA: Diagnosis present

## 2012-05-17 DIAGNOSIS — D61810 Antineoplastic chemotherapy induced pancytopenia: Secondary | ICD-10-CM | POA: Diagnosis present

## 2012-05-17 DIAGNOSIS — D649 Anemia, unspecified: Secondary | ICD-10-CM

## 2012-05-17 DIAGNOSIS — C9200 Acute myeloblastic leukemia, not having achieved remission: Secondary | ICD-10-CM | POA: Diagnosis present

## 2012-05-17 DIAGNOSIS — E876 Hypokalemia: Secondary | ICD-10-CM | POA: Diagnosis present

## 2012-05-17 DIAGNOSIS — D709 Neutropenia, unspecified: Secondary | ICD-10-CM | POA: Diagnosis present

## 2012-05-17 DIAGNOSIS — E119 Type 2 diabetes mellitus without complications: Secondary | ICD-10-CM | POA: Diagnosis present

## 2012-05-17 DIAGNOSIS — Z299 Encounter for prophylactic measures, unspecified: Secondary | ICD-10-CM

## 2012-05-17 DIAGNOSIS — J189 Pneumonia, unspecified organism: Principal | ICD-10-CM | POA: Diagnosis present

## 2012-05-17 DIAGNOSIS — T451X5A Adverse effect of antineoplastic and immunosuppressive drugs, initial encounter: Secondary | ICD-10-CM | POA: Diagnosis present

## 2012-05-17 HISTORY — DX: Malignant (primary) neoplasm, unspecified: C80.1

## 2012-05-17 LAB — URINALYSIS, ROUTINE W REFLEX MICROSCOPIC
Bilirubin Urine: NEGATIVE
Specific Gravity, Urine: 1.013 (ref 1.005–1.030)
Urobilinogen, UA: 0.2 mg/dL (ref 0.0–1.0)
pH: 7 (ref 5.0–8.0)

## 2012-05-17 LAB — COMPREHENSIVE METABOLIC PANEL
AST: 21 U/L (ref 0–37)
BUN: 20 mg/dL (ref 6–23)
CO2: 26 mEq/L (ref 19–32)
Chloride: 98 mEq/L (ref 96–112)
Creatinine, Ser: 0.62 mg/dL (ref 0.50–1.10)
GFR calc non Af Amer: 81 mL/min — ABNORMAL LOW (ref >90)
Total Bilirubin: 0.9 mg/dL (ref 0.3–1.2)

## 2012-05-17 LAB — LACTIC ACID, PLASMA: Lactic Acid, Venous: 1.4 mmol/L (ref 0.5–2.2)

## 2012-05-17 LAB — GLUCOSE, CAPILLARY: Glucose-Capillary: 225 mg/dL — ABNORMAL HIGH (ref 70–99)

## 2012-05-17 LAB — URINE MICROSCOPIC-ADD ON: Urine-Other: NONE SEEN

## 2012-05-17 MED ORDER — VANCOMYCIN HCL IN DEXTROSE 1-5 GM/200ML-% IV SOLN
1000.0000 mg | Freq: Once | INTRAVENOUS | Status: AC
  Administered 2012-05-18: 1000 mg via INTRAVENOUS
  Filled 2012-05-17: qty 200

## 2012-05-17 MED ORDER — AZTREONAM 1 G IJ SOLR
2.0000 g | Freq: Once | INTRAMUSCULAR | Status: AC
  Administered 2012-05-18: 2 g via INTRAVENOUS
  Filled 2012-05-17: qty 2

## 2012-05-17 MED ORDER — ACETAMINOPHEN 325 MG PO TABS: 650.0000 mg | ORAL_TABLET | Freq: Four times a day (QID) | ORAL | Status: DC | PRN

## 2012-05-17 MED ORDER — LEVOFLOXACIN IN D5W 750 MG/150ML IV SOLN
750.0000 mg | Freq: Once | INTRAVENOUS | Status: AC
  Administered 2012-05-17: 750 mg via INTRAVENOUS
  Filled 2012-05-17: qty 150

## 2012-05-17 MED ORDER — SODIUM CHLORIDE 0.9 % IV SOLN
INTRAVENOUS | Status: DC
  Administered 2012-05-17: 23:00:00 via INTRAVENOUS

## 2012-05-17 MED ORDER — SODIUM CHLORIDE 0.9 % IV BOLUS (SEPSIS)
2000.0000 mL | Freq: Once | INTRAVENOUS | Status: AC
  Administered 2012-05-17: 2000 mL via INTRAVENOUS

## 2012-05-17 NOTE — ED Notes (Signed)
Bedside report received from previous RN 

## 2012-05-17 NOTE — ED Notes (Signed)
Per EMS - Pt is leukemia pt who just completed extensive round of chemo last week. Felt a little better yesterday but then just did "not feel right" this morning when she got up. Pt lives at home w/ her husband, husband did not want pt to come to ED because she will miss super and not receive her insulin. 12 lead - incomplete BBB, BP - 194/75, HR - 92, O2 SAT 98% RA, CBG - 325. Pt denies pain or any other complaints. Pt is scheduled for an oncology appt in a.m. For lab work.

## 2012-05-17 NOTE — ED Notes (Signed)
XBJ:[id number]<NF> Expected date:05/17/12<BR> Expected time: 5:45 PM<BR> Means of arrival:Ambulance<BR> Comments:<BR> Chemo pt feeling weak

## 2012-05-17 NOTE — ED Provider Notes (Signed)
History     CSN: [id number]  Arrival date & time 05/17/12  1757   First MD Initiated Contact with Patient 05/17/12 1857      Chief Complaint  Patient presents with  . Weakness    (Consider location/radiation/quality/duration/timing/severity/associated sxs/prior treatment) HPI This 79 year old female has a history of chronic anemia with myelodysplastic syndrome and was recently diagnosed as acute myelogenous leukemia and history chemotherapy within the last couple weeks, she has had generalized weakness and starting chemotherapy but today he developed shaking chills and worsening generalized weakness and came to the ED where she was found to have a temperature of 100.5, she is no altered mental status no cough no running nose no rash no vomiting no diarrhea no chest pain no shortness breath no abdominal pain, she just feels generally weak. Her blood sugars have been running in the 300s. There is no treatment prior to arrival. Past Medical History  Diagnosis Date  . Hypertension   . Diabetes mellitus   . Hyperkalemia   . IBS (irritable bowel syndrome)   . Hypercholesterolemia   . Cervical radiculopathy   . Anemia   . Cancer     leukemia    Past Surgical History  Procedure Date  . Varicose veins     surgey 1959  . Tonsilectomy, adenoidectomy, bilateral myringotomy and tubes   . Esophagogastroduodenoscopy 04/01/2011    Procedure: ESOPHAGOGASTRODUODENOSCOPY (EGD);  Surgeon: Barrie Folk, MD;  Location: [facility] ENDOSCOPY;  Service: Endoscopy;  Laterality: N/A;    Family History  Problem Relation Age of Onset  . Stroke Mother   . Hypertension Father   . Cancer Sister   . Cancer Sister   . Diabetes Sister   . Cancer Sister     colon cancer  . Cancer Daughter     breast cancer    History  Substance Use Topics  . Smoking status: Never Smoker   . Smokeless tobacco: Not on file  . Alcohol Use: No    OB History    Grav Para Term Preterm Abortions TAB SAB Ect Mult Living               Review of Systems 10 Systems reviewed and are negative for acute change except as noted in the HPI. Allergies  Amoxicillin; Cephalexin; Metformin and related; Phenergan; and Procardia  Home Medications   Current Outpatient Rx  Name  Route  Sig  Dispense  Refill  . METOPROLOL TARTRATE 50 MG PO TABS   Oral   Take 1 tablet (50 mg total) by mouth 2 (two) times daily.   180 tablet   3     BP 170/74  Pulse 96  Temp 98.7 F (37.1 C) (Oral)  Resp 18  Ht 5\' 4"  (1.626 m)  Wt 99 lb 1.6 oz (44.951 kg)  BMI 17.01 kg/m2  SpO2 94%  Physical Exam  Nursing note and vitals reviewed. Constitutional:       Awake, alert, nontoxic appearance.  HENT:  Head: Atraumatic.  Mouth/Throat: No oropharyngeal exudate.       Dry oral mucosa  Eyes: Right eye exhibits no discharge. Left eye exhibits no discharge.  Neck: Neck supple.  Cardiovascular: Regular rhythm.   No murmur heard.      tachycardic  Pulmonary/Chest: Effort normal and breath sounds normal. No respiratory distress. She has no wheezes. She has no rales. She exhibits no tenderness.  Abdominal: Soft. There is no tenderness. There is no rebound.  Musculoskeletal: She exhibits no tenderness.  Baseline ROM, no obvious new focal weakness.  Neurological: She is alert.       Mental status and motor strength appears baseline for patient and situation.  Skin: No rash noted.  Psychiatric: She has a normal mood and affect.  Pt with eschar nasal septum bilat from spontaneous epistaxis PTA resolved  ED Course  Procedures (including critical care time) D/w [facility] and Onc for admit and transfusions RBCs and Plts.  CRITICAL CARE Performed by: Hurman Horn   Total critical care time:  Critical care time was exclusive of separately billable procedures and treating other patients.  Critical care was necessary to treat or prevent imminent or life-threatening deterioration.  Critical care was time spent personally by  me on the following activities: development of treatment plan with patient and/or surrogate as well as nursing, discussions with consultants, evaluation of patient's response to treatment, examination of patient, obtaining history from patient or surrogate, ordering and performing treatments and interventions, ordering and review of laboratory studies, ordering and review of radiographic studies, pulse oximetry and re-evaluation of patient's condition. Labs Reviewed  URINALYSIS, ROUTINE W REFLEX MICROSCOPIC - Abnormal; Notable for the following:    Glucose, UA 250 (*)     Hgb urine dipstick SMALL (*)     All other components within normal limits  CBC WITH DIFFERENTIAL - Abnormal; Notable for the following:    WBC 0.4 (*)     RBC 2.13 (*)     Hemoglobin 6.2 (*)     HCT 18.1 (*)     Platelets <5 (*)     Neutrophils Relative 0 (*)  CORRECTED ON 01/20 AT 0552: PREVIOUSLY REPORTED AS 18   Neutro Abs 0.0 (*)  CORRECTED ON 01/20 AT 0552: PREVIOUSLY REPORTED AS 0.1   Lymphocytes Relative 0 (*)  CORRECTED ON 01/20 AT 0552: PREVIOUSLY REPORTED AS 80   Lymphs Abs 0.0 (*)  CORRECTED ON 01/20 AT 0552: PREVIOUSLY REPORTED AS 0.3   Monocytes Relative 0 (*)     Monocytes Absolute 0.0 (*)     All other components within normal limits  COMPREHENSIVE METABOLIC PANEL - Abnormal; Notable for the following:    Sodium 134 (*)     Glucose, Bld 234 (*)     Total Protein 5.9 (*)     Albumin 3.2 (*)     GFR calc non Af Amer 81 (*)     All other components within normal limits  GLUCOSE, CAPILLARY - Abnormal; Notable for the following:    Glucose-Capillary 225 (*)     All other components within normal limits  COMPREHENSIVE METABOLIC PANEL - Abnormal; Notable for the following:    Potassium 3.3 (*)     Glucose, Bld 200 (*)     Calcium 8.3 (*)     Total Protein 5.2 (*)     Albumin 2.6 (*)     GFR calc non Af Amer 81 (*)     All other components within normal limits  CBC WITH DIFFERENTIAL - Abnormal; Notable  for the following:    WBC 0.4 (*)     RBC 3.03 (*)     Hemoglobin 9.1 (*)     HCT 25.9 (*)     Platelets <5 (*)     Neutrophils Relative 0 (*)     Lymphocytes Relative 0 (*)     Monocytes Relative 0 (*)     Lymphs Abs 0.0 (*)     Monocytes Absolute 0.0 (*)  All other components within normal limits  GLUCOSE, CAPILLARY - Abnormal; Notable for the following:    Glucose-Capillary 192 (*)     All other components within normal limits  GLUCOSE, CAPILLARY - Abnormal; Notable for the following:    Glucose-Capillary 158 (*)     All other components within normal limits  CBC WITH DIFFERENTIAL - Abnormal; Notable for the following:    WBC 0.7 (*)     RBC 2.63 (*)     Hemoglobin 7.9 (*)     HCT 22.6 (*)     Platelets 46 (*)     Neutrophils Relative 0 (*)     Lymphocytes Relative 0 (*)     Monocytes Relative 0 (*)     Lymphs Abs 0.0 (*)     Monocytes Absolute 0.0 (*)     All other components within normal limits  GLUCOSE, CAPILLARY - Abnormal; Notable for the following:    Glucose-Capillary 186 (*)     All other components within normal limits  GLUCOSE, CAPILLARY - Abnormal; Notable for the following:    Glucose-Capillary 167 (*)     All other components within normal limits  GLUCOSE, CAPILLARY - Abnormal; Notable for the following:    Glucose-Capillary 179 (*)     All other components within normal limits  GLUCOSE, CAPILLARY - Abnormal; Notable for the following:    Glucose-Capillary 232 (*)     All other components within normal limits  GLUCOSE, CAPILLARY - Abnormal; Notable for the following:    Glucose-Capillary 139 (*)     All other components within normal limits  CBC WITH DIFFERENTIAL - Abnormal; Notable for the following:    WBC 0.7 (*)     RBC 3.68 (*)     Hemoglobin 11.0 (*)     HCT 32.2 (*)     Platelets 25 (*)     Neutrophils Relative 0 (*)     Lymphocytes Relative 0 (*)     Monocytes Relative 0 (*)     Lymphs Abs 0.0 (*)     Monocytes Absolute 0.0 (*)      All other components within normal limits  BASIC METABOLIC PANEL - Abnormal; Notable for the following:    GFR calc non Af Amer 85 (*)     All other components within normal limits  GLUCOSE, CAPILLARY - Abnormal; Notable for the following:    Glucose-Capillary 248 (*)     All other components within normal limits  GLUCOSE, CAPILLARY - Abnormal; Notable for the following:    Glucose-Capillary 200 (*)     All other components within normal limits  GLUCOSE, CAPILLARY - Abnormal; Notable for the following:    Glucose-Capillary 151 (*)     All other components within normal limits  CBC WITH DIFFERENTIAL - Abnormal; Notable for the following:    WBC 1.2 (*)     RBC 3.54 (*)     Hemoglobin 10.7 (*)     HCT 31.0 (*)     Platelets 21 (*)     Neutrophils Relative 15 (*)     Lymphocytes Relative 69 (*)     Monocytes Relative 15 (*)     Neutro Abs 0.2 (*)     All other components within normal limits  GLUCOSE, CAPILLARY - Abnormal; Notable for the following:    Glucose-Capillary 132 (*)     All other components within normal limits  GLUCOSE, CAPILLARY - Abnormal; Notable for the following:    Glucose-Capillary 67 (*)  All other components within normal limits  GLUCOSE, CAPILLARY - Abnormal; Notable for the following:    Glucose-Capillary 201 (*)     All other components within normal limits  VANCOMYCIN, TROUGH - Abnormal; Notable for the following:    Vancomycin Tr <5.0 (*)  RESULT REPEATED AND VERIFIED   All other components within normal limits  GLUCOSE, CAPILLARY - Abnormal; Notable for the following:    Glucose-Capillary 253 (*)     All other components within normal limits  CBC WITH DIFFERENTIAL - Abnormal; Notable for the following:    WBC 1.7 (*)     RBC 3.62 (*)     Hemoglobin 11.0 (*)     HCT 31.8 (*)     Platelets 15 (*)     Neutrophils Relative 40 (*)     Lymphocytes Relative 57 (*)     Monocytes Relative 1 (*)     Neutro Abs 0.7 (*)     Monocytes Absolute 0.0 (*)      All other components within normal limits  GLUCOSE, CAPILLARY - Abnormal; Notable for the following:    Glucose-Capillary 170 (*)     All other components within normal limits  GLUCOSE, CAPILLARY - Abnormal; Notable for the following:    Glucose-Capillary 152 (*)     All other components within normal limits  GLUCOSE, CAPILLARY - Abnormal; Notable for the following:    Glucose-Capillary 148 (*)     All other components within normal limits  GLUCOSE, CAPILLARY - Abnormal; Notable for the following:    Glucose-Capillary 185 (*)     All other components within normal limits  CBC WITH DIFFERENTIAL - Abnormal; Notable for the following:    WBC 2.5 (*)     RBC 3.36 (*)     Hemoglobin 10.1 (*)     HCT 30.1 (*)     Platelets 11 (*)     Neutrophils Relative 21 (*)     Monocytes Relative 34 (*)     Basophils Relative 3 (*)     Neutro Abs 0.5 (*)     All other components within normal limits  BASIC METABOLIC PANEL - Abnormal; Notable for the following:    Glucose, Bld 174 (*)     GFR calc non Af Amer 82 (*)     All other components within normal limits  GLUCOSE, CAPILLARY - Abnormal; Notable for the following:    Glucose-Capillary 204 (*)     All other components within normal limits  GLUCOSE, CAPILLARY - Abnormal; Notable for the following:    Glucose-Capillary 143 (*)     All other components within normal limits  GLUCOSE, CAPILLARY - Abnormal; Notable for the following:    Glucose-Capillary 316 (*)     All other components within normal limits  CBC WITH DIFFERENTIAL - Abnormal; Notable for the following:    RBC 3.30 (*)     Hemoglobin 10.0 (*)     HCT 29.6 (*)     Platelets 49 (*)     Monocytes Relative 0 (*)     Basophils Relative 2 (*)     Monocytes Absolute 0.0 (*)     All other components within normal limits  GLUCOSE, CAPILLARY - Abnormal; Notable for the following:    Glucose-Capillary 185 (*)     All other components within normal limits  BASIC METABOLIC PANEL -  Abnormal; Notable for the following:    Glucose, Bld 188 (*)     GFR calc non  Af Amer 85 (*)     All other components within normal limits  GLUCOSE, CAPILLARY - Abnormal; Notable for the following:    Glucose-Capillary 230 (*)     All other components within normal limits  GLUCOSE, CAPILLARY - Abnormal; Notable for the following:    Glucose-Capillary 157 (*)     All other components within normal limits  CULTURE, BLOOD (ROUTINE X 2)  CULTURE, BLOOD (ROUTINE X 2)  URINE CULTURE  LACTIC ACID, PLASMA  PROCALCITONIN  URINE MICROSCOPIC-ADD ON  TYPE AND SCREEN  PREPARE RBC (CROSSMATCH)  INFLUENZA PANEL BY PCR  PREPARE PLATELET PHERESIS  PATHOLOGIST SMEAR REVIEW  PREPARE RBC (CROSSMATCH)  GLUCOSE, CAPILLARY  TYPE AND SCREEN  PREPARE PLATELET PHERESIS  CBC  CBC  CBC WITH DIFFERENTIAL  CBC  BASIC METABOLIC PANEL   No results found.   1. Neutropenic fever   2. 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 Anemia   4. Pancytopenia   5. HCAP (healthcare-associated pneumonia)   6. Preventive measure   7. Essential hypertension, benign   8. Febrile neutropenia   9. Constipation       MDM   Pt stable in ED with no significant deterioration in condition. Patient / Family / Caregiver informed of clinical course, understand medical decision-making process, and agree with plan.       Hurman Horn, MD 05/24/12 2256

## 2012-05-18 ENCOUNTER — Other Ambulatory Visit: Payer: Medicare Other | Admitting: Lab

## 2012-05-18 ENCOUNTER — Other Ambulatory Visit: Payer: Self-pay

## 2012-05-18 ENCOUNTER — Ambulatory Visit: Payer: Medicare Other | Admitting: Adult Health

## 2012-05-18 ENCOUNTER — Inpatient Hospital Stay (HOSPITAL_COMMUNITY): Payer: Medicare Other

## 2012-05-18 ENCOUNTER — Ambulatory Visit: Payer: Medicare Other | Admitting: Physician Assistant

## 2012-05-18 DIAGNOSIS — D61818 Other pancytopenia: Secondary | ICD-10-CM | POA: Diagnosis present

## 2012-05-18 DIAGNOSIS — H538 Other visual disturbances: Secondary | ICD-10-CM | POA: Diagnosis present

## 2012-05-18 DIAGNOSIS — D709 Neutropenia, unspecified: Secondary | ICD-10-CM

## 2012-05-18 DIAGNOSIS — R636 Underweight: Secondary | ICD-10-CM | POA: Diagnosis present

## 2012-05-18 DIAGNOSIS — E43 Unspecified severe protein-calorie malnutrition: Secondary | ICD-10-CM | POA: Diagnosis present

## 2012-05-18 DIAGNOSIS — R5081 Fever presenting with conditions classified elsewhere: Secondary | ICD-10-CM

## 2012-05-18 LAB — COMPREHENSIVE METABOLIC PANEL
Alkaline Phosphatase: 42 U/L (ref 39–117)
BUN: 13 mg/dL (ref 6–23)
Chloride: 103 mEq/L (ref 96–112)
Creatinine, Ser: 0.6 mg/dL (ref 0.50–1.10)
GFR calc Af Amer: >90 mL/min (ref >90)
Glucose, Bld: 200 mg/dL — ABNORMAL HIGH (ref 70–99)
Potassium: 3.3 mEq/L — ABNORMAL LOW (ref 3.5–5.1)
Total Bilirubin: 1.1 mg/dL (ref 0.3–1.2)
Total Protein: 5.2 g/dL — ABNORMAL LOW (ref 6.0–8.3)

## 2012-05-18 LAB — CBC WITH DIFFERENTIAL/PLATELET
Basophils Absolute: 0 10*3/uL (ref 0.0–0.1)
Eosinophils Absolute: 0 10*3/uL (ref 0.0–0.7)
Eosinophils Absolute: 0 10*3/uL (ref 0.0–0.7)
Eosinophils Relative: 0 % (ref 0–5)
Eosinophils Relative: 0 % (ref 0–5)
Hemoglobin: 6.2 g/dL — CL (ref 12.0–15.0)
Lymphocytes Relative: 0 % — ABNORMAL LOW (ref 12–46)
Lymphs Abs: 0 10*3/uL — ABNORMAL LOW (ref 0.7–4.0)
MCH: 29.1 pg (ref 26.0–34.0)
MCV: 85 fL (ref 78.0–100.0)
Monocytes Absolute: 0 10*3/uL — ABNORMAL LOW (ref 0.1–1.0)
Monocytes Relative: 0 % — ABNORMAL LOW (ref 3–12)
Monocytes Relative: 0 % — ABNORMAL LOW (ref 3–12)
Platelets: <5 10*3/uL — CL (ref 150–400)
RBC: 2.13 MIL/uL — ABNORMAL LOW (ref 3.87–5.11)
RBC: 3.03 MIL/uL — ABNORMAL LOW (ref 3.87–5.11)
RDW: 13.7 % (ref 11.5–15.5)
WBC: 0.4 10*3/uL — CL (ref 4.0–10.5)

## 2012-05-18 LAB — INFLUENZA PANEL BY PCR (TYPE A & B)
H1N1 flu by pcr: NOT DETECTED
Influenza B By PCR: NEGATIVE

## 2012-05-18 LAB — GLUCOSE, CAPILLARY
Glucose-Capillary: 192 mg/dL — ABNORMAL HIGH (ref 70–99)
Glucose-Capillary: 167 mg/dL — ABNORMAL HIGH (ref 70–99)

## 2012-05-18 LAB — PATHOLOGIST SMEAR REVIEW

## 2012-05-18 LAB — PREPARE RBC (CROSSMATCH)

## 2012-05-18 MED ORDER — ACETAMINOPHEN 650 MG RE SUPP: 650.0000 mg | Freq: Four times a day (QID) | RECTAL | Status: DC | PRN

## 2012-05-18 MED ORDER — INSULIN ASPART 100 UNIT/ML ~~LOC~~ SOLN
3.0000 [IU] | Freq: Three times a day (TID) | SUBCUTANEOUS | Status: DC
  Administered 2012-05-18 (×3): 3 [IU] via SUBCUTANEOUS

## 2012-05-18 MED ORDER — INSULIN ASPART 100 UNIT/ML ~~LOC~~ SOLN
0.0000 [IU] | Freq: Three times a day (TID) | SUBCUTANEOUS | Status: DC
  Administered 2012-05-18 (×3): 3 [IU] via SUBCUTANEOUS

## 2012-05-18 MED ORDER — VANCOMYCIN HCL 1000 MG IV SOLR
750.0000 mg | INTRAVENOUS | Status: DC
  Administered 2012-05-19 – 2012-05-22 (×4): 750 mg via INTRAVENOUS
  Filled 2012-05-18 (×4): qty 750

## 2012-05-18 MED ORDER — PREGABALIN 75 MG PO CAPS: 75.0000 mg | ORAL_CAPSULE | Freq: Two times a day (BID) | ORAL | Status: DC

## 2012-05-18 MED ORDER — DEXTROSE 5 % IV SOLN
2.0000 g | Freq: Three times a day (TID) | INTRAVENOUS | Status: DC
  Administered 2012-05-18 – 2012-05-26 (×24): 2 g via INTRAVENOUS
  Filled 2012-05-18 (×28): qty 2

## 2012-05-18 MED ORDER — LORAZEPAM 0.5 MG PO TABS
0.5000 mg | ORAL_TABLET | Freq: Four times a day (QID) | ORAL | Status: DC | PRN
  Administered 2012-05-24: 0.5 mg via ORAL
  Filled 2012-05-18: qty 1

## 2012-05-18 MED ORDER — INSULIN ASPART 100 UNIT/ML ~~LOC~~ SOLN: 0.0000 [IU] | Freq: Three times a day (TID) | SUBCUTANEOUS | Status: DC

## 2012-05-18 MED ORDER — ONDANSETRON HCL 4 MG/2ML IJ SOLN
4.0000 mg | Freq: Four times a day (QID) | INTRAMUSCULAR | Status: DC | PRN
  Administered 2012-05-18: 4 mg via INTRAVENOUS
  Filled 2012-05-18: qty 2

## 2012-05-18 MED ORDER — GLUCERNA SHAKE PO LIQD
237.0000 mL | Freq: Two times a day (BID) | ORAL | Status: DC
  Administered 2012-05-19 – 2012-05-26 (×8): 237 mL via ORAL
  Filled 2012-05-18 (×17): qty 237

## 2012-05-18 MED ORDER — OMEGA-3-ACID ETHYL ESTERS 1 G PO CAPS
1.0000 g | ORAL_CAPSULE | Freq: Every day | ORAL | Status: DC
  Administered 2012-05-18 – 2012-05-26 (×9): 1 g via ORAL
  Filled 2012-05-18 (×9): qty 1

## 2012-05-18 MED ORDER — PROCHLORPERAZINE MALEATE 10 MG PO TABS: 10.0000 mg | ORAL_TABLET | Freq: Four times a day (QID) | ORAL | Status: DC | PRN

## 2012-05-18 MED ORDER — METOPROLOL TARTRATE 50 MG PO TABS: 50.0000 mg | ORAL_TABLET | Freq: Two times a day (BID) | ORAL | Status: DC

## 2012-05-18 MED ORDER — SODIUM CHLORIDE 0.9 % IV SOLN
INTRAVENOUS | Status: AC
  Administered 2012-05-18: 02:00:00 via INTRAVENOUS

## 2012-05-18 MED ORDER — PREGABALIN 75 MG PO CAPS
75.0000 mg | ORAL_CAPSULE | Freq: Two times a day (BID) | ORAL | Status: DC
  Administered 2012-05-18 – 2012-05-26 (×17): 75 mg via ORAL
  Filled 2012-05-18 (×17): qty 1

## 2012-05-18 MED ORDER — CALCIUM GLUCONATE 500 MG PO TABS: 500.0000 mg | ORAL_TABLET | Freq: Every day | ORAL | Status: DC

## 2012-05-18 MED ORDER — SIMVASTATIN 5 MG PO TABS
5.0000 mg | ORAL_TABLET | Freq: Every day | ORAL | Status: DC
  Administered 2012-05-18 – 2012-05-25 (×8): 5 mg via ORAL
  Filled 2012-05-18 (×9): qty 1

## 2012-05-18 MED ORDER — TETANUS-DIPHTH-ACELL PERTUSSIS 5-2.5-18.5 LF-MCG/0.5 IM SUSP: 0.5000 mL | Freq: Once | INTRAMUSCULAR | Status: DC

## 2012-05-18 MED ORDER — FISH OIL 1000 MG PO CAPS: 1000.0000 mg | ORAL_CAPSULE | Freq: Every day | ORAL | Status: DC

## 2012-05-18 MED ORDER — ACETAMINOPHEN 325 MG PO TABS
650.0000 mg | ORAL_TABLET | Freq: Four times a day (QID) | ORAL | Status: DC | PRN
  Administered 2012-05-18 – 2012-05-22 (×6): 650 mg via ORAL
  Filled 2012-05-18 (×6): qty 2

## 2012-05-18 MED ORDER — SODIUM CHLORIDE 0.9 % IV SOLN
INTRAVENOUS | Status: DC
  Administered 2012-05-18: 12:00:00 via INTRAVENOUS

## 2012-05-18 MED ORDER — POLYETHYLENE GLYCOL 3350 17 G PO PACK
17.0000 g | PACK | Freq: Every day | ORAL | Status: DC | PRN
  Administered 2012-05-19: 17 g via ORAL
  Filled 2012-05-18: qty 1

## 2012-05-18 MED ORDER — METOPROLOL TARTRATE 50 MG PO TABS
50.0000 mg | ORAL_TABLET | Freq: Two times a day (BID) | ORAL | Status: DC
  Administered 2012-05-18 – 2012-05-25 (×15): 50 mg via ORAL
  Filled 2012-05-18 (×16): qty 1

## 2012-05-18 MED ORDER — VANCOMYCIN HCL IN DEXTROSE 1-5 GM/200ML-% IV SOLN: 1000.0000 mg | INTRAVENOUS | Status: DC

## 2012-05-18 MED ORDER — AMLODIPINE BESYLATE 2.5 MG PO TABS
2.5000 mg | ORAL_TABLET | Freq: Every morning | ORAL | Status: DC
  Administered 2012-05-18 – 2012-05-26 (×9): 2.5 mg via ORAL
  Filled 2012-05-18 (×9): qty 1

## 2012-05-18 MED ORDER — FILGRASTIM 300 MCG/ML IJ SOLN
300.0000 ug | Freq: Every day | INTRAMUSCULAR | Status: DC
  Administered 2012-05-18 – 2012-05-23 (×6): 300 ug via SUBCUTANEOUS
  Filled 2012-05-18 (×10): qty 1

## 2012-05-18 MED ORDER — INSULIN ASPART 100 UNIT/ML ~~LOC~~ SOLN: 0.0000 [IU] | Freq: Every day | SUBCUTANEOUS | Status: DC

## 2012-05-18 MED ORDER — CALCIUM CARBONATE 1250 (500 CA) MG PO TABS
1.0000 | ORAL_TABLET | Freq: Every day | ORAL | Status: DC
  Administered 2012-05-18 – 2012-05-26 (×9): 500 mg via ORAL
  Filled 2012-05-18 (×9): qty 1

## 2012-05-18 MED ORDER — SALINE SPRAY 0.65 % NA SOLN
1.0000 | NASAL | Status: DC | PRN
  Administered 2012-05-18: 1 via NASAL
  Filled 2012-05-18: qty 44

## 2012-05-18 MED ORDER — ONDANSETRON HCL 4 MG PO TABS: 4.0000 mg | ORAL_TABLET | Freq: Four times a day (QID) | ORAL | Status: DC | PRN

## 2012-05-18 MED ORDER — LEVOFLOXACIN IN D5W 750 MG/150ML IV SOLN
750.0000 mg | INTRAVENOUS | Status: DC
  Administered 2012-05-19 – 2012-05-25 (×4): 750 mg via INTRAVENOUS
  Filled 2012-05-18 (×5): qty 150

## 2012-05-18 NOTE — Consult Note (Signed)
Meredith Rodriguez   DOB:[birth year]   ZO#:[id number]   JWJ#:[id number]  Subjective: Meredith Rodriguez is an 79 y/o female who has MDS/AML currently undergoing treatment with Decitabine.  She is cycle 3 day 15, and was admitted to the hospital yesterday evening with febrile neutropenia.  She was found to be anemic and thrombocytopenic as well.  She was transfused with PRBCs overnight.  Her Tmax was 100.5, and she is tolerating her Azactam, Levaquin, and Vancomycin without difficulty.  She hasn't had a bowel movement in 3 days. She did have blurred vision and an MRI of the brain was neg.  Otherwise, she's feeling improved and a 10 point ROS is neg.    Objective:  Filed Vitals:   05/18/12 1400  BP: 165/48  Pulse: 86  Temp: 98.9 F (37.2 C)  Resp: 12    Body mass index is 17.01 kg/(m^2).  Intake/Output Summary (Last 24 hours) at 05/18/12 1515 Last data filed at 05/18/12 1245  Gross per 24 hour  Intake   1490 ml  Output    753 ml  Net    737 ml    General: Patient is a well appearing female in no acute distress HEENT: PERRLA, sclerae anicteric no conjunctival pallor, MMM Neck: supple, no palpable adenopathy Lungs: clear to auscultation bilaterally, no wheezes, rhonchi, or rales Cardiovascular: regular rate rhythm, S1, S2, no murmurs, rubs or gallops Abdomen: Soft, non-tender, non-distended, normoactive bowel sounds, no HSM Extremities: warm and well perfused, no clubbing, cyanosis, or edema Skin: No rashes or lesions Neuro: Non-focal   CBG (last 3)   Basename 05/18/12 1218 05/18/12 0816 05/17/12 1954  GLUCAP 158* 192* 225*     Labs:  Lab Results  Component Value Date   WBC 0.4* 05/18/2012   HGB 9.1* 05/18/2012   HCT 25.9* 05/18/2012   MCV 85.5 05/18/2012   PLT <5* 05/18/2012   NEUTROABS 0.0* 05/17/2012    Urine Studies No results found for this basename: UACOL:2,UAPR:2,USPG:2,UPH:2,UTP:2,UGL:2,UKET:2,UBIL:2,UHGB:2,UNIT:2,UROB:2,ULEU:2,UEPI:2,UWBC:2,URBC:2,UBAC:2,CAST:2,CRYS:2,UCOM:2,BILUA:2 in  the last 72 hours  Basic Metabolic Panel:  Lab 05/18/12 6213 05/17/12 2033  NA 138 134*  K 3.3* 3.8  CL 103 98  CO2 25 26  GLUCOSE 200* 234*  BUN 13 20  CREATININE 0.60 0.62  CALCIUM 8.3* 8.7  MG -- --  PHOS -- --   GFR Estimated Creatinine Clearance: 37.2 ml/min (by C-G formula based on Cr of 0.6). Liver Function Tests:  Lab 05/18/12 0955 05/17/12 2033  AST 16 21  ALT 19 26  ALKPHOS 42 50  BILITOT 1.1 0.9  PROT 5.2* 5.9*  ALBUMIN 2.6* 3.2*   No results found for this basename: LIPASE:5,AMYLASE:5 in the last 168 hours No results found for this basename: AMMONIA:5 in the last 168 hours Coagulation profile No results found for this basename: INR:5,PROTIME:5 in the last 168 hours  CBC:  Lab 05/18/12 0955 05/17/12 2033  WBC 0.4* 0.4*  NEUTROABS -- 0.0*  HGB 9.1* 6.2*  HCT 25.9* 18.1*  MCV 85.5 85.0  PLT <5* <5*   Cardiac Enzymes: No results found for this basename: CKTOTAL:5,CKMB:5,CKMBINDEX:5,TROPONINI:5 in the last 168 hours BNP: No components found with this basename: POCBNP:5 CBG:  Lab 05/18/12 1218 05/18/12 0816 05/17/12 1954  GLUCAP 158* 192* 225*   D-Dimer No results found for this basename: DDIMER:2 in the last 72 hours Hgb A1c No results found for this basename: HGBA1C:2 in the last 72 hours Lipid Profile No results found for this basename: CHOL:2,HDL:2,LDLCALC:2,TRIG:2,CHOLHDL:2,LDLDIRECT:2 in the last 72 hours Thyroid function studies No  results found for this basename: TSH,T4TOTAL,FREET3,T3FREE,THYROIDAB in the last 72 hours Anemia work up No results found for this basename: VITAMINB12:2,FOLATE:2,FERRITIN:2,TIBC:2,IRON:2,RETICCTPCT:2 in the last 72 hours Microbiology No results found for this or any previous visit (from the past 240 hour(s)).    Studies:  Mr Meredith Rodriguez Contrast  05/18/2012  *RADIOLOGY REPORT*  Clinical Data: Sudden onset of blurred vision.  History of  AML, hypertension and diabetes.  MRI HEAD WITHOUT CONTRAST  Technique:   Multiplanar, multiecho pulse sequences of the brain and surrounding structures were obtained according to standard protocol without intravenous contrast.  Comparison: 07/13/2003 head CT.  No comparison brain MR.  Findings: No acute infarct.  No intracranial hemorrhage.  Moderate small vessel disease type changes.  Global atrophy without hydrocephalus.  No intracranial mass lesion detected on this unenhanced exam.  Diffuse altered signal intensity of bone marrow.  This may reflect changes of anemia although infiltration by tumor not excluded in this patient with history of leukemia.  Partially empty sella incidentally noted.  Cervical medullary junction and pineal region unremarkable.  Slight asymmetry of the superior ophthalmic vein otherwise orbital structures unremarkable.  No obvious abnormality at the cavernous sinus level.  Minimal to mild paranasal sinus mucosal thickening.  Major intracranial vascular structures are patent.  IMPRESSION: No acute infarct.  Moderate small vessel disease type changes.  Global atrophy without hydrocephalus.  Diffuse altered signal intensity of bone marrow.  This may reflect changes of anemia although infiltration by tumor not excluded in this patient with history of leukemia.  Minimal to mild paranasal sinus mucosal thickening.   Original Report Authenticated By: Meredith Rodriguez, M.D.    Dg Chest Port 1 View  05/17/2012  *RADIOLOGY REPORT*  Clinical Data: History of AML with fever.  PORTABLE CHEST - 1 VIEW  Comparison: 10/17/2011  Findings: Single view of the chest demonstrates a Port-A-Cath. Port-A-Cath tip is in the SVC region.  There is a new round opacity in the right hilum.  This opacity roughly measures 4.0 x 2.4 cm. In addition, there are interstitial densities in the right lower lung.  Heart size is normal.  Atherosclerotic calcifications of the aortic arch.  IMPRESSION: New nodular opacity in the right hilum and slightly prominent interstitial densities at the right lung  base.  Findings could represent multifocal pneumonia but cannot exclude a right hilar mass or lymphadenopathy.  These findings could better characterized with a chest CT.   Original Report Authenticated By: Meredith Rodriguez, M.D.     Assessment: 79 y.o. with AML admitted with febrile neutropenia and panyctopenia, day 15 cycle 3 of Decitabine chemotherapy.      Plan:  1. Febrile Neutropenia: Continue antibiotics.  Blood cultures and urine culture pending.  CXR shows possible infiltrate.    2. Anemia/Thrombocytopenia: Continue to transfuse for hemoglobin less than 8, platelets less than 20.    3. AML: Currently day 15 of therapy.  Will likely dose reduce next cycle.    4. Blurred vision: MRI neg. Likely due to severe anemia.    5. Hyperglycemia/Diabetes mellitus: recommend blood glucose monitoring ACHS.    6. FEN: Continue IVF and supplement electrolytes PRN.     Augustin Schooling   ATTENDING'S ATTESTATION:  I personally reviewed patient's chart, examined patient myself, formulated the treatment plan as followed.   Recommend continuing broad spectrum antibiotics.Blood transfusion today (ordered). Spoke to patient and her husband.  Drue Second, MD Medical/Oncology [facility] [phone] (beeper) [phone] (Office)  05/19/2012, 5:06 PM

## 2012-05-18 NOTE — Progress Notes (Signed)
INITIAL NUTRITION ASSESSMENT  Pt meets criteria for severe MALNUTRITION in the context of acute illness as evidenced by 3% weight loss in the past 2 weeks per family report in addition to pt with moderate subcutaneous fat loss and muscle wasting in clavicles/acromion bone region.  DOCUMENTATION CODES Per approved criteria  -Severe malnutrition in the context of acute illness or injury -Underweight   INTERVENTION: - Glucerna shakes BID - Discussed importance of adequate protein intake in building up muscle strength - Will continue to monitor   NUTRITION DIAGNOSIS: Increased nutrient needs related to acute myeloid leukemia/myelodysplastic syndrome with recent unintended weight loss as evidenced by pt/family report.   Goal: Pt to consume >90% of meals/supplements  Monitor:  Weights, labs, intake  Reason for Assessment: Consult  79 y.o. female  Admitting Dx: Febrile neutropenia  ASSESSMENT: Pt with acute myeloid leukemia/myelodysplastic syndrome s/p chemotherapy 2 weeks ago. Met with pt and family who report pt eating 3 meals/day PTA and was drinking Glucerna shakes on/off. Pt denies any problems chewing/swallowing. Pt denies any changes in appetite from treatment. Family reports pt weighed 102 pounds 2 weeks ago, 100 pounds 1 week ago, now weighs 99 pounds. Pt with visible moderate subcutaneous fat loss and muscle wasting in clavicles/sternal region.   Height: Ht Readings from Last 1 Encounters:  05/18/12 5\' 4"  (1.626 m)    Weight: Wt Readings from Last 1 Encounters:  05/18/12 99 lb 1.6 oz (44.951 kg)    Ideal Body Weight: 120 lb  % Ideal Body Weight: 82  Wt Readings from Last 10 Encounters:  05/18/12 99 lb 1.6 oz (44.951 kg)  05/11/12 100 lb 9.6 oz (45.632 kg)  05/04/12 102 lb 8 oz (46.494 kg)  04/27/12 102 lb 1.6 oz (46.312 kg)  04/20/12 99 lb 12.8 oz (45.269 kg)  04/13/12 97 lb 4.8 oz (44.135 kg)  04/06/12 96 lb 4.8 oz (43.681 kg)  03/30/12 99 lb 8 oz (45.133 kg)   03/23/12 103 lb 8 oz (46.947 kg)  03/16/12 101 lb 12.8 oz (46.176 kg)    Usual Body Weight: 102 lb 2 weeks ago per family report  % Usual Body Weight: 97  BMI:  Body mass index is 17.01 kg/(m^2).  Estimated Nutritional Needs: Kcal: 1350-1600 Protein: 70-80g Fluid: 1.3-1.6L/day   Diet Order: Cardiac  EDUCATION NEEDS: -No education needs identified at this time   Intake/Output Summary (Last 24 hours) at 05/18/12 1111 Last data filed at 05/18/12 1000  Gross per 24 hour  Intake  987.5 ml  Output    453 ml  Net  534.5 ml    Last BM: 1/17  Labs:   Lab 05/18/12 0955 05/17/12 2033  NA 138 134*  K 3.3* 3.8  CL 103 98  CO2 25 26  BUN 13 20  CREATININE 0.60 0.62  CALCIUM 8.3* 8.7  MG -- --  PHOS -- --  GLUCOSE 200* 234*    CBG (last 3)   Basename 05/18/12 0816 05/17/12 1954  GLUCAP 192* 225*    Scheduled Meds:   . sodium chloride   Intravenous STAT  . amLODipine  2.5 mg Oral q morning - 10a  . aztreonam  2 g Intravenous Q8H  . calcium carbonate  1 tablet Oral Daily  . filgrastim (NEUPOGEN)  SQ  300 mcg Subcutaneous q1800  . insulin aspart  0-15 Units Subcutaneous TID WC  . insulin aspart  0-5 Units Subcutaneous QHS  . insulin aspart  3 Units Subcutaneous TID WC  . levofloxacin (LEVAQUIN)  IV  750 mg Intravenous Q48H  . metoprolol  50 mg Oral BID  . omega-3 acid ethyl esters  1 g Oral Daily  . simvastatin  5 mg Oral QHS  . vancomycin  1,000 mg Intravenous Q24H    Continuous Infusions:   . sodium chloride      Past Medical History  Diagnosis Date  . Hypertension   . Diabetes mellitus   . Hyperkalemia   . IBS (irritable bowel syndrome)   . Hypercholesterolemia   . Cervical radiculopathy   . Anemia   . Cancer     leukemia    Past Surgical History  Procedure Date  . Varicose veins     surgey 1959  . Tonsilectomy, adenoidectomy, bilateral myringotomy and tubes   . Esophagogastroduodenoscopy 04/01/2011    Procedure:  ESOPHAGOGASTRODUODENOSCOPY (EGD);  Surgeon: Barrie Folk, MD;  Location: [facility] ENDOSCOPY;  Service: Endoscopy;  Laterality: N/A;    Levon Hedger MS, RD, LDN [phone] Pager [phone] After Hours Pager

## 2012-05-18 NOTE — Progress Notes (Signed)
ANTIBIOTIC CONSULT NOTE - Follow Up  Pharmacy Consult for Azactam/Levaquin/Vancomycin Indication: Sepsis from possible HCAP/Febrile Neutropenia   Allergies  Allergen Reactions  . Amoxicillin (Amoxicillin)     Upset stomach  . Cephalexin Nausea Only  . Metformin And Related     Gi problems  . Phenergan (Promethazine Hcl) Other (See Comments)    Unknown   . Procardia (Nifedipine)     dizziness    Patient Measurements: Height: 5\' 4"  (162.6 cm) Weight: 99 lb 1.6 oz (44.951 kg) IBW/kg (Calculated) : 54.7    Vital Signs: Temp: 99.1 F (37.3 C) (01/20 0649) Temp src: Oral (01/20 0649) BP: 139/43 mmHg (01/20 0649) Pulse Rate: 78  (01/20 0649) Intake/Output from previous day: 01/19 0701 - 01/20 0700 In: 200 [Blood:200] Out: 203 [Urine:203] Intake/Output from this shift: Total I/O In: 787.5 [I.V.:787.5] Out: 250 [Urine:250]  Labs:  [facility] 05/18/12 0955 05/17/12 2033  WBC 0.4* 0.4*  HGB 9.1* 6.2*  PLT <5* <5*  LABCREA -- --  CREATININE 0.60 0.62   Estimated Creatinine Clearance: 37.2 ml/min (by C-G formula based on Cr of 0.6). No results found for this basename: VANCOTROUGH:2,VANCOPEAK:2,VANCORANDOM:2,GENTTROUGH:2,GENTPEAK:2,GENTRANDOM:2,TOBRATROUGH:2,TOBRAPEAK:2,TOBRARND:2,AMIKACINPEAK:2,AMIKACINTROU:2,AMIKACIN:2, in the last 72 hours   Microbiology: No results found for this or any previous visit (from the past 720 hour(s)).  Medical History: Past Medical History  Diagnosis Date  . Hypertension   . Diabetes mellitus   . Hyperkalemia   . IBS (irritable bowel syndrome)   . Hypercholesterolemia   . Cervical radiculopathy   . Anemia   . Cancer     leukemia    Medications:  Scheduled:     . sodium chloride   Intravenous STAT  . amLODipine  2.5 mg Oral q morning - 10a  . [COMPLETED] aztreonam  2 g Intravenous Once  . aztreonam  2 g Intravenous Q8H  . calcium carbonate  1 tablet Oral Daily  . filgrastim (NEUPOGEN)  SQ  300 mcg Subcutaneous q1800  .  insulin aspart  0-15 Units Subcutaneous TID WC  . insulin aspart  0-5 Units Subcutaneous QHS  . insulin aspart  3 Units Subcutaneous TID WC  . [COMPLETED] levofloxacin (LEVAQUIN) IV  750 mg Intravenous Once  . levofloxacin (LEVAQUIN) IV  750 mg Intravenous Q48H  . metoprolol  50 mg Oral BID  . omega-3 acid ethyl esters  1 g Oral Daily  . simvastatin  5 mg Oral QHS  . [COMPLETED] sodium chloride  2,000 mL Intravenous Once  . [COMPLETED] vancomycin  1,000 mg Intravenous Once  . vancomycin  1,000 mg Intravenous Q24H  . [DISCONTINUED] calcium gluconate  500 mg Oral Daily  . [DISCONTINUED] Fish Oil  1,000 mg Oral Daily  . [DISCONTINUED] insulin aspart  0-9 Units Subcutaneous TID WC  . [DISCONTINUED] TDaP  0.5 mL Intramuscular Once   Infusions:     . sodium chloride    . [DISCONTINUED] sodium chloride 125 mL/hr at 05/17/12 2253   Assessment:  79 yo with history of AML/MDS on chemotherapy (last 1/10) admitted with febrile neutopenia, x-ray suggestive of PNA.  MD ordering Azactam, Vancomycin and Levaquin per RX for HCAP.  Estimated CrCl of 37 - Will adjust vancomycin dosing  Goal of Therapy:  Vancomycin trough level 15-20 mcg/ml  Plan:  1) Continue current aztreonam and levofloxacin dosing 2) Based on CG CrCl of 37 will adjust vancomycin dosing from 1g IV q24 to 750mg  IV q24 (patient with low weight and age of 59)  Hessie Knows, PharmD, BCPS Pager [phone] 05/18/2012  11:23 AM

## 2012-05-18 NOTE — ED Notes (Signed)
Report called to floor RN

## 2012-05-18 NOTE — Progress Notes (Addendum)
[facility] PROGRESS NOTE  Meredith Rodriguez ZOX:[id number] DOB: 1926/01/08 DOA: 05/17/2012 PCP: [name], MD Oncologist: Dr. Welton Flakes  Brief narrative: Meredith Rodriguez is an 79 year old woman with a past medical history of AML/MDS, under the care of Dr. Welton Flakes, status post chemotherapy approximately 2 weeks ago, who presented to the hospital on 05/17/2012 with weakness, fever, and chills. Upon initial evaluation emergency department, the patient was found to be neutropenic and had evidence of pneumonia on chest radiography.  Assessment/Plan: Principal Problem:  *Febrile neutropenia with healthcare associated pneumonia  Admitted and placed on empiric antibiotics including vancomycin, Levaquin, and aztreonam.  Blood cultures, influenza panel negative. Procalcitonin and lactic acid not elevated. Influenza studies negative.  Start Neupogen. Active Problems:  Blurred vision  MRI of the brain obtained which did not show any obvious metastasis.  DIABETES MELLITUS II, UNCOMPLICATED  CBGs elevated. We'll change sliding scale insulin to moderate scale and add 3 units of NovoLog per meal coverage.  HYPERLIPIDEMIA  Continue statin and fish oil.  HYPERTENSION, BENIGN SYSTEMIC  Blood pressure controlled on Norvasc and metoprolol.  Normocytic Anemia  Likely anemia of chronic disease and the sequela of prior chemotherapy.  2 units of packed red blood cells ordered.  Acute myeloid leukemia, without mention of having achieved remission  Will notify oncologist of patient's admission.  Thrombocytopenia  Likely the sequela of prior chemotherapy. One pack of platelets ordered by admitting physician.  Pancytopenia  Secondary to chemotherapy. Transfuse as noted above. Neupogen for low white blood cell count.  Severe protein-calorie malnutrition in the context of chronic illness / underweight  Dietitian consultation requested.  Code Status: Full Family Communication: Husband updated at  bedside. Disposition Plan: Home when stable.   Medical Consultants:  None.  Other Consultants:  Dietitian  Anti-infectives:  Levaquin 05/17/2012--->  Vancomycin 05/17/2012--->  Aztreonam 05/17/2012--->  HPI/Subjective: Meredith Rodriguez continues to feel under the weather. She reports some irritation in her nose from prior nosebleeds and scabbing. Mildly short of breath with a dry cough. Appetite good. Mildly constipated.  Objective: Filed Vitals:   05/18/12 0415 05/18/12 0450 05/18/12 0550 05/18/12 0649  BP: 121/52 109/35 112/45 139/43  Pulse: 96 82 82 78  Temp: 99.1 F (37.3 C) 99.7 F (37.6 C) 98.7 F (37.1 C) 99.1 F (37.3 C)  TempSrc: Oral Oral Oral Oral  Resp: 12 14 12 14   Height:      Weight:      SpO2:        Intake/Output Summary (Last 24 hours) at 05/18/12 0736 Last data filed at 05/18/12 0649  Gross per 24 hour  Intake    200 ml  Output    200 ml  Net      0 ml    Exam: Gen:  NAD Cardiovascular:  RRR, No M/R/G Respiratory:  Lungs diminished Gastrointestinal:  Abdomen soft, NT/ND, + BS Extremities:  No C/E/C  Data Reviewed: Basic Metabolic Panel:  Lab 05/17/12 8119 05/11/12 1012  NA 134* 132*  K 3.8 4.1  CL 98 99  CO2 26 27  GLUCOSE 234* 370*  BUN 20 32.0*  CREATININE 0.62 0.9  CALCIUM 8.7 9.2  MG -- --  PHOS -- --   GFR Estimated Creatinine Clearance: 37.2 ml/min (by C-G formula based on Cr of 0.62). Liver Function Tests:  Lab 05/17/12 2033 05/11/12 1012  AST 21 15  ALT 26 24  ALKPHOS 50 68  BILITOT 0.9 0.65  PROT 5.9* 6.2*  ALBUMIN 3.2* 3.1*    CBC:  Lab 05/17/12 2033 05/11/12 1012  WBC 0.4* 4.7  NEUTROABS 0.0* 4.2  HGB 6.2* 8.6*  HCT 18.1* 24.8*  MCV 85.0 86.2  PLT <5* 65*   CBG:  Lab 05/17/12 1954  GLUCAP 225*   Microbiology No results found for this or any previous visit (from the past 240 hour(s)).   Procedures and Diagnostic Studies: Dg Chest Port 1 View  05/17/2012  *RADIOLOGY REPORT*  Clinical Data: History  of AML with fever.  PORTABLE CHEST - 1 VIEW  Comparison: 10/17/2011  Findings: Single view of the chest demonstrates a Port-A-Cath. Port-A-Cath tip is in the SVC region.  There is a new round opacity in the right hilum.  This opacity roughly measures 4.0 x 2.4 cm. In addition, there are interstitial densities in the right lower lung.  Heart size is normal.  Atherosclerotic calcifications of the aortic arch.  IMPRESSION: New nodular opacity in the right hilum and slightly prominent interstitial densities at the right lung base.  Findings could represent multifocal pneumonia but cannot exclude a right hilar mass or lymphadenopathy.  These findings could better characterized with a chest CT.   Original Report Authenticated By: Richarda Overlie, M.D.     Scheduled Meds:    . sodium chloride   Intravenous STAT  . amLODipine  2.5 mg Oral q morning - 10a  . aztreonam  2 g Intravenous Q8H  . calcium carbonate  1 tablet Oral Daily  . insulin aspart  0-9 Units Subcutaneous TID WC  . levofloxacin (LEVAQUIN) IV  750 mg Intravenous Q48H  . metoprolol  50 mg Oral BID  . omega-3 acid ethyl esters  1 g Oral Daily  . simvastatin  5 mg Oral QHS  . vancomycin  1,000 mg Intravenous Q24H   Continuous Infusions:    . sodium chloride      Time spent: 25 minutes   LOS: 1 day   Meredith Rodriguez  [facility] Pager [phone].  If 8PM-8AM, please contact night-coverage at [email or web address], password [facility] 05/18/2012, 7:36 AM

## 2012-05-18 NOTE — Progress Notes (Signed)
ANTIBIOTIC CONSULT NOTE - INITIAL  Pharmacy Consult for Azactam/Levaquin/Vancomycin Indication: Sepsis from possible HCAP/Febrile Neutropenia   Allergies  Allergen Reactions  . Amoxicillin (Amoxicillin)     Upset stomach  . Cephalexin Nausea Only  . Metformin And Related     Gi problems  . Phenergan (Promethazine Hcl) Other (See Comments)    Unknown   . Procardia (Nifedipine)     dizziness    Patient Measurements: Height: 5\' 4"  (162.6 cm) Weight: 99 lb 1.6 oz (44.951 kg) IBW/kg (Calculated) : 54.7    Vital Signs: Temp: 99.4 F (37.4 C) (01/20 0131) Temp src: Oral (01/20 0131) BP: 92/76 mmHg (01/20 0131) Pulse Rate: 115  (01/20 0131) Intake/Output from previous day: 01/19 0701 - 01/20 0700 In: 12.5 [Blood:12.5] Out: 200 [Urine:200] Intake/Output from this shift: Total I/O In: 12.5 [Blood:12.5] Out: 200 [Urine:200]  Labs:  [facility] 05/17/12 2033  WBC 0.4*  HGB 6.2*  PLT <5*  LABCREA --  CREATININE 0.62   Estimated Creatinine Clearance: 37.2 ml/min (by C-G formula based on Cr of 0.62). No results found for this basename: VANCOTROUGH:2,VANCOPEAK:2,VANCORANDOM:2,GENTTROUGH:2,GENTPEAK:2,GENTRANDOM:2,TOBRATROUGH:2,TOBRAPEAK:2,TOBRARND:2,AMIKACINPEAK:2,AMIKACINTROU:2,AMIKACIN:2, in the last 72 hours   Microbiology: No results found for this or any previous visit (from the past 720 hour(s)).  Medical History: Past Medical History  Diagnosis Date  . Hypertension   . Diabetes mellitus   . Hyperkalemia   . IBS (irritable bowel syndrome)   . Hypercholesterolemia   . Cervical radiculopathy   . Anemia   . Cancer     leukemia    Medications:  Scheduled:    . sodium chloride   Intravenous STAT  . amLODipine  2.5 mg Oral q morning - 10a  . [COMPLETED] aztreonam  2 g Intravenous Once  . calcium carbonate  1 tablet Oral Daily  . insulin aspart  0-9 Units Subcutaneous TID WC  . [COMPLETED] levofloxacin (LEVAQUIN) IV  750 mg Intravenous Once  . metoprolol  50 mg  Oral BID  . omega-3 acid ethyl esters  1 g Oral Daily  . simvastatin  5 mg Oral QHS  . [COMPLETED] sodium chloride  2,000 mL Intravenous Once  . vancomycin  1,000 mg Intravenous Once  . [DISCONTINUED] calcium gluconate  500 mg Oral Daily  . [DISCONTINUED] Fish Oil  1,000 mg Oral Daily  . [DISCONTINUED] TDaP  0.5 mL Intramuscular Once   Infusions:    . sodium chloride    . [DISCONTINUED] sodium chloride 125 mL/hr at 05/17/12 2253   Assessment: 79 yo with history of AML/MDS on chemotherapy (last 1/10) admitted with febrile neutopenia, x-ray suggestive of PNA.  MD ordering Azactam, Vancomycin and Levaquin per RX for HCAP.  Goal of Therapy:  Vancomycin trough level 15-20 mcg/ml  Plan:   Azactam 2Gm IV q8h.  Levaquin 750mg  IV q48h.  Vancomycin 1Gm IV q24h. CrCl~48 (N)  F/U SCr/levels as needed.  Susanne Greenhouse R 05/18/2012,1:55 AM

## 2012-05-18 NOTE — Progress Notes (Signed)
Pt's BP has been consistently low since admission to this unit, NP is aware.  Pt is asymptomatic, receiving second unit of PRBC's as well as IVF.  No new orders at this time.  Will continue to assess.  Also Pt is to receive platelets however there are none in house.  Per blood bank platelets should be available this morning. Order for platelets does not need to be released in Epic as this nurse inadvertently released that order instead of second unit of blood.  Again spoke with blood bank and was informed that this would not be an issue.

## 2012-05-18 NOTE — H&P (Signed)
Meredith Rodriguez is an 78 y.o. female.   Both patient was seen and examined on May 17 2012. Oncologist - Dr. Welton Flakes, Konrad Dolores. Chief Complaint: Weakness and fever and chills. HPI: 79 year-old female with with history of AML/MDS who is on chemotherapy last chemotherapy was 2 weeks ago has been having increasing weakness over the last 2 weeks with blurred vision. Last 2 days patient has also been noticing some epistaxis with fever and chills today. Denies any shortness of breath but does have nonproductive cough. Denies nausea vomiting abdominal pain. Has had a bowel movement 2 days ago after taking MiraLAX and denies any blood in it. In the ER patient was found to be febrile with pancytopenia specifically neutropenic and platelet counts were less than 5000. Hemoglobin has dropped from 8-6. Oncologist on call was consulted by ER physician at this time as instructed to transfuse PRBC and platelets and in addition patient will be on antibiotics for febrile neutropenia. Chest x-ray was showing possibility of pneumonic process.  Past Medical History  Diagnosis Date  . Hypertension   . Diabetes mellitus   . Hyperkalemia   . IBS (irritable bowel syndrome)   . Hypercholesterolemia   . Cervical radiculopathy   . Anemia   . Cancer     leukemia    Past Surgical History  Procedure Date  . Varicose veins     surgey 1959  . Tonsilectomy, adenoidectomy, bilateral myringotomy and tubes   . Esophagogastroduodenoscopy 04/01/2011    Procedure: ESOPHAGOGASTRODUODENOSCOPY (EGD);  Surgeon: Barrie Folk, MD;  Location: [facility] ENDOSCOPY;  Service: Endoscopy;  Laterality: N/A;    Family History  Problem Relation Age of Onset  . Stroke Mother   . Hypertension Father   . Cancer Sister   . Cancer Sister   . Diabetes Sister   . Cancer Sister     colon cancer  . Cancer Daughter     breast cancer   Social History:  reports that she has never smoked. She does not have any smokeless tobacco history on file. She reports  that she does not drink alcohol or use illicit drugs.  Allergies:  Allergies  Allergen Reactions  . Amoxicillin (Amoxicillin)     Upset stomach  . Cephalexin Nausea Only  . Metformin And Related     Gi problems  . Phenergan (Promethazine Hcl) Other (See Comments)    Unknown   . Procardia (Nifedipine)     dizziness     (Not in a hospital admission)  Results for orders placed during the hospital encounter of 05/17/12 (from the past 48 hour(s))  URINALYSIS, ROUTINE W REFLEX MICROSCOPIC     Status: Abnormal   Collection Time   05/17/12  7:16 PM      Component Value Range Comment   Color, Urine YELLOW  YELLOW    APPearance CLEAR  CLEAR    Specific Gravity, Urine 1.013  1.005 - 1.030    pH 7.0  5.0 - 8.0    Glucose, UA 250 (*) NEGATIVE mg/dL    Hgb urine dipstick SMALL (*) NEGATIVE    Bilirubin Urine NEGATIVE  NEGATIVE    Ketones, ur NEGATIVE  NEGATIVE mg/dL    Protein, ur NEGATIVE  NEGATIVE mg/dL    Urobilinogen, UA 0.2  0.0 - 1.0 mg/dL    Nitrite NEGATIVE  NEGATIVE    Leukocytes, UA NEGATIVE  NEGATIVE   URINE MICROSCOPIC-ADD ON     Status: Normal   Collection Time   05/17/12  7:16  PM      Component Value Range Comment   Urine-Other        Value: NO FORMED ELEMENTS SEEN ON URINE MICROSCOPIC EXAMINATION  GLUCOSE, CAPILLARY     Status: Abnormal   Collection Time   05/17/12  7:54 PM      Component Value Range Comment   Glucose-Capillary 225 (*) 70 - 99 mg/dL   CBC WITH DIFFERENTIAL     Status: Abnormal   Collection Time   05/17/12  8:33 PM      Component Value Range Comment   WBC 0.4 (*) 4.0 - 10.5 K/uL    RBC 2.13 (*) 3.87 - 5.11 MIL/uL    Hemoglobin 6.2 (*) 12.0 - 15.0 g/dL    HCT 16.1 (*) 09.6 - 46.0 %    MCV 85.0  78.0 - 100.0 fL    MCH 29.1  26.0 - 34.0 pg    MCHC 34.3  30.0 - 36.0 g/dL    RDW 04.5  40.9 - 81.1 %    Platelets <5 (*) 150 - 400 K/uL    Neutrophils Relative 18 (*) 43 - 77 %    Neutro Abs 0.1 (*) 1.7 - 7.7 K/uL    Lymphocytes Relative 80 (*) 12 - 46  %    Lymphs Abs 0.3 (*) 0.7 - 4.0 K/uL    Monocytes Relative 0 (*) 3 - 12 %    Monocytes Absolute 0.0 (*) 0.1 - 1.0 K/uL    Eosinophils Relative 3  0 - 5 %    Eosinophils Absolute 0.0  0.0 - 0.7 K/uL    Basophils Relative 0  0 - 1 %    Basophils Absolute 0.0  0.0 - 0.1 K/uL   COMPREHENSIVE METABOLIC PANEL     Status: Abnormal   Collection Time   05/17/12  8:33 PM      Component Value Range Comment   Sodium 134 (*) 135 - 145 mEq/L    Potassium 3.8  3.5 - 5.1 mEq/L    Chloride 98  96 - 112 mEq/L    CO2 26  19 - 32 mEq/L    Glucose, Bld 234 (*) 70 - 99 mg/dL    BUN 20  6 - 23 mg/dL    Creatinine, Ser 9.14  0.50 - 1.10 mg/dL    Calcium 8.7  8.4 - 78.2 mg/dL    Total Protein 5.9 (*) 6.0 - 8.3 g/dL    Albumin 3.2 (*) 3.5 - 5.2 g/dL    AST 21  0 - 37 U/L    ALT 26  0 - 35 U/L    Alkaline Phosphatase 50  39 - 117 U/L    Total Bilirubin 0.9  0.3 - 1.2 mg/dL    GFR calc non Af Amer 81 (*) >90 mL/min    GFR calc Af Amer >90  >90 mL/min   LACTIC ACID, PLASMA     Status: Normal   Collection Time   05/17/12  8:33 PM      Component Value Range Comment   Lactic Acid, Venous 1.4  0.5 - 2.2 mmol/L   PROCALCITONIN     Status: Normal   Collection Time   05/17/12  8:33 PM      Component Value Range Comment   Procalcitonin <0.10     TYPE AND SCREEN     Status: Normal (Preliminary result)   Collection Time   05/17/12 10:20 PM      Component Value Range Comment  ABO/RH(D) AB NEG      Antibody Screen NEG      Sample Expiration 05/20/2012      Unit Number [id number]      Blood Component Type RCLI PHER 2      Unit division 00      Status of Unit ALLOCATED      Donor AG Type NEGATIVE FOR C ANTIGEN      Transfusion Status OK TO TRANSFUSE      Crossmatch Result COMPATIBLE      Dg Chest Port 1 View  05/17/2012  *RADIOLOGY REPORT*  Clinical Data: History of AML with fever.  PORTABLE CHEST - 1 VIEW  Comparison: 10/17/2011  Findings: Single view of the chest demonstrates a Port-A-Cath.  Port-A-Cath tip is in the SVC region.  There is a new round opacity in the right hilum.  This opacity roughly measures 4.0 x 2.4 cm. In addition, there are interstitial densities in the right lower lung.  Heart size is normal.  Atherosclerotic calcifications of the aortic arch.  IMPRESSION: New nodular opacity in the right hilum and slightly prominent interstitial densities at the right lung base.  Findings could represent multifocal pneumonia but cannot exclude a right hilar mass or lymphadenopathy.  These findings could better characterized with a chest CT.   Original Report Authenticated By: Richarda Overlie, M.D.     Review of Systems  Constitutional: Positive for fever and chills.  HENT: Positive for nosebleeds.   Eyes: Positive for blurred vision.  Respiratory: Negative.   Cardiovascular: Negative.   Gastrointestinal: Negative.   Genitourinary: Negative.   Musculoskeletal: Negative.   Skin: Negative.   Neurological: Positive for weakness.  Endo/Heme/Allergies: Negative.   Psychiatric/Behavioral: Negative.     Blood pressure 126/38, pulse 89, temperature 99.7 F (37.6 C), temperature source Oral, resp. rate 15, SpO2 97.00%. Physical Exam  Constitutional: She is oriented to person, place, and time. She appears well-developed and well-nourished. No distress.  HENT:  Head: Normocephalic and atraumatic.  Right Ear: External ear normal.  Left Ear: External ear normal.  Nose: Nose normal.  Mouth/Throat: Oropharynx is clear and moist. No oropharyngeal exudate.  Eyes: Conjunctivae normal are normal. Pupils are equal, round, and reactive to light. Right eye exhibits no discharge. Left eye exhibits no discharge. No scleral icterus.  Neck: Normal range of motion. Neck supple.  Cardiovascular: Normal rate and regular rhythm.   Respiratory: Effort normal and breath sounds normal. No respiratory distress. She has no wheezes. She has no rales.  GI: Soft. Bowel sounds are normal.  Musculoskeletal:  Normal range of motion. She exhibits no edema and no tenderness.  Neurological: She is alert and oriented to person, place, and time.       Moves all extremities.  Skin: Skin is warm and dry. She is not diaphoretic.     Assessment/Plan #1. Febrile neutropenia - blood cultures have been obtained and the patient has been started on empiric antibiotics to cover for possible pneumonia(health care associated) as chest x-ray was suggestive of pneumonia. Check influenza panel. Patient will be on droplet precautions. #2. Pancytopenia - patient is to receive packed red blood cells for her anemia and platelets for her severe thrombocytopenia. Closely follow CBC. Further recommendations per oncologist. #3. Blurred vision - could be from the severe anemia or paraneoplastic syndrome. Check MRI brain. #4. Diabetes mellitus type 2 - continue home medications with sliding-scale coverage. #5. Hypertension - continue home medications. #6. Hyperlipidemia - continue home medications.  CODE STATUS - full  code as discussed with patient. Patient's husband and son were at the bedside.  Zehava Turski N. 05/18/2012, 12:53 AM

## 2012-05-19 DIAGNOSIS — E876 Hypokalemia: Secondary | ICD-10-CM | POA: Diagnosis present

## 2012-05-19 LAB — GLUCOSE, CAPILLARY
Glucose-Capillary: 179 mg/dL — ABNORMAL HIGH (ref 70–99)
Glucose-Capillary: 139 mg/dL — ABNORMAL HIGH (ref 70–99)

## 2012-05-19 LAB — CBC WITH DIFFERENTIAL/PLATELET
Band Neutrophils: 0 % (ref 0–10)
Basophils Absolute: 0 10*3/uL (ref 0.0–0.1)
Basophils Relative: 0 % (ref 0–1)
Eosinophils Relative: 0 % (ref 0–5)
HCT: 22.6 % — ABNORMAL LOW (ref 36.0–46.0)
Hemoglobin: 7.9 g/dL — ABNORMAL LOW (ref 12.0–15.0)
Lymphs Abs: 0 10*3/uL — ABNORMAL LOW (ref 0.7–4.0)
MCH: 30 pg (ref 26.0–34.0)
MCV: 85.9 fL (ref 78.0–100.0)
Monocytes Relative: 0 % — ABNORMAL LOW (ref 3–12)
Platelets: 46 10*3/uL — ABNORMAL LOW (ref 150–400)
RBC: 2.63 MIL/uL — ABNORMAL LOW (ref 3.87–5.11)
WBC: 0.7 10*3/uL — CL (ref 4.0–10.5)
nRBC: 0 /100 WBC

## 2012-05-19 LAB — PREPARE PLATELET PHERESIS: Unit division: 0

## 2012-05-19 LAB — URINE CULTURE

## 2012-05-19 MED ORDER — ACETAMINOPHEN 325 MG PO TABS
650.0000 mg | ORAL_TABLET | Freq: Once | ORAL | Status: AC
  Administered 2012-05-19: 650 mg via ORAL

## 2012-05-19 MED ORDER — POTASSIUM CHLORIDE IN NACL 20-0.9 MEQ/L-% IV SOLN
INTRAVENOUS | Status: DC
  Administered 2012-05-19 – 2012-05-20 (×2): via INTRAVENOUS
  Administered 2012-05-21: 20 mL/h via INTRAVENOUS
  Filled 2012-05-19 (×7): qty 1000

## 2012-05-19 MED ORDER — DIPHENHYDRAMINE HCL 25 MG PO CAPS
25.0000 mg | ORAL_CAPSULE | Freq: Once | ORAL | Status: AC
  Administered 2012-05-19: 25 mg via ORAL
  Filled 2012-05-19: qty 1

## 2012-05-19 MED ORDER — INSULIN ASPART 100 UNIT/ML ~~LOC~~ SOLN
4.0000 [IU] | Freq: Three times a day (TID) | SUBCUTANEOUS | Status: DC
  Administered 2012-05-19 – 2012-05-26 (×19): 4 [IU] via SUBCUTANEOUS

## 2012-05-19 MED ORDER — INSULIN GLARGINE 100 UNIT/ML ~~LOC~~ SOLN
10.0000 [IU] | Freq: Every day | SUBCUTANEOUS | Status: DC
  Administered 2012-05-19 – 2012-05-20 (×2): 10 [IU] via SUBCUTANEOUS

## 2012-05-19 MED ORDER — INSULIN ASPART 100 UNIT/ML ~~LOC~~ SOLN
0.0000 [IU] | Freq: Three times a day (TID) | SUBCUTANEOUS | Status: DC
  Administered 2012-05-19: 3 [IU] via SUBCUTANEOUS
  Administered 2012-05-19: 2 [IU] via SUBCUTANEOUS
  Administered 2012-05-19: 5 [IU] via SUBCUTANEOUS
  Administered 2012-05-20 (×2): 3 [IU] via SUBCUTANEOUS
  Administered 2012-05-21: 5 [IU] via SUBCUTANEOUS
  Administered 2012-05-21: 8 [IU] via SUBCUTANEOUS
  Administered 2012-05-22 (×2): 3 [IU] via SUBCUTANEOUS
  Administered 2012-05-22: 2 [IU] via SUBCUTANEOUS
  Administered 2012-05-23: 3 [IU] via SUBCUTANEOUS
  Administered 2012-05-23: 11 [IU] via SUBCUTANEOUS
  Administered 2012-05-23: 2 [IU] via SUBCUTANEOUS
  Administered 2012-05-24 (×3): 3 [IU] via SUBCUTANEOUS
  Administered 2012-05-25: 8 [IU] via SUBCUTANEOUS
  Administered 2012-05-25: 5 [IU] via SUBCUTANEOUS
  Administered 2012-05-25: 3 [IU] via SUBCUTANEOUS
  Administered 2012-05-26: 5 [IU] via SUBCUTANEOUS
  Administered 2012-05-26: 3 [IU] via SUBCUTANEOUS

## 2012-05-19 MED ORDER — INSULIN ASPART 100 UNIT/ML ~~LOC~~ SOLN
0.0000 [IU] | Freq: Every day | SUBCUTANEOUS | Status: DC
  Administered 2012-05-19 – 2012-05-23 (×3): 2 [IU] via SUBCUTANEOUS
  Administered 2012-05-25: 3 [IU] via SUBCUTANEOUS

## 2012-05-19 NOTE — Progress Notes (Signed)
Pre transfusion temperature 102.5 oral.  NP notified and order received to give tylenol and benadryl premeds as ordered and recheck in one hour.  If temp less than 100 then can transfuse and readminister tylenol and benadryl prior to second unit if >3 or 4 hours out.  If not down in one hour call NP and reevaluate at that time.

## 2012-05-19 NOTE — Progress Notes (Signed)
Order received from Merdis Delay NP to give blood in spite of temp 100.6.

## 2012-05-19 NOTE — Progress Notes (Signed)
[facility] PROGRESS NOTE  Meredith Rodriguez ZOX:[id number] DOB: 1925-08-02 DOA: 05/17/2012 PCP: [name], MD Oncologist: Dr. Welton Flakes  Brief narrative: Meredith Rodriguez is an 79 year old woman with a past medical history of AML/MDS, under the care of Dr. Welton Flakes, status post chemotherapy approximately 2 weeks ago, who presented to the hospital on 05/17/2012 with weakness, fever, and chills. Upon initial evaluation emergency department, the patient was found to be neutropenic and had evidence of pneumonia on chest radiography.  Assessment/Plan: Principal Problem:  *Febrile neutropenia with healthcare associated pneumonia  Admitted and placed on empiric antibiotics including vancomycin, Levaquin, and aztreonam.  Blood cultures, influenza panel negative. Procalcitonin and lactic acid not elevated. Influenza studies negative.  Continue Neupogen. Active Problems:  Hypokalemia  Potassium added to IV fluids.  Blurred vision  MRI of the brain obtained which did not show any obvious metastasis.  DIABETES MELLITUS II, UNCOMPLICATED  CBGs 128-255. Continue sliding scale insulin moderate scale and increase meal coverage to 4 units of NovoLog. Add 10 units of Lantus each bedtime.  HYPERLIPIDEMIA  Continue statin and fish oil.  HYPERTENSION, BENIGN SYSTEMIC  Blood pressure controlled on Norvasc and metoprolol.  Normocytic Anemia  Likely anemia of chronic disease and the sequela of prior chemotherapy.  Status post 2 units of packed red blood cells 05/18/2012. Continue to monitor hemoglobin transfuse for hemoglobin less than 8.  Acute myeloid leukemia, without mention of having achieved remission  Will notify oncologist of patient's admission.  Thrombocytopenia  Likely the sequela of prior chemotherapy. Status post one pack of platelets. Platelet count improved. No further indication for transfusion today.  Pancytopenia  Secondary to chemotherapy. Transfuse as noted above. Neupogen for low white  blood cell count.  Severe protein-calorie malnutrition in the context of chronic illness / underweight  Dietitian consultation performed 05/18/2012. Glucerna shakes ordered.  Code Status: Full Family Communication: Husband updated at bedside. Disposition Plan: Home when stable.   Medical Consultants:  None.  Other Consultants:  Dietitian  Anti-infectives:  Levaquin 05/17/2012--->  Vancomycin 05/17/2012--->  Aztreonam 05/17/2012--->  HPI/Subjective: Meredith Rodriguez continues feels a bit weak but denies pain, dyspnea, nausea, vomiting, or diarrhea. She feels a little bit constipated. Appetite is fair. Still spiking fevers.  Objective: Filed Vitals:   05/18/12 2101 05/19/12 0540 05/19/12 0604 05/19/12 0713  BP: 144/53 148/44    Pulse: 97 92    Temp: 99.7 F (37.6 C) 102.5 F (39.2 C) 101.9 F (38.8 C) 100.3 F (37.9 C)  TempSrc: Oral Oral  Oral  Resp: 16 18    Height:      Weight:      SpO2: 94% 90%      Intake/Output Summary (Last 24 hours) at 05/19/12 0726 Last data filed at 05/19/12 0600  Gross per 24 hour  Intake   1530 ml  Output   1150 ml  Net    380 ml    Exam: Gen:  NAD Cardiovascular:  RRR, No M/R/G Respiratory:  Lungs diminished Gastrointestinal:  Abdomen soft, NT/ND, + BS Extremities:  No C/E/C  Data Reviewed: Basic Metabolic Panel:  Lab 05/18/12 8119 05/17/12 2033  NA 138 134*  K 3.3* 3.8  CL 103 98  CO2 25 26  GLUCOSE 200* 234*  BUN 13 20  CREATININE 0.60 0.62  CALCIUM 8.3* 8.7  MG -- --  PHOS -- --   GFR Estimated Creatinine Clearance: 37.2 ml/min (by C-G formula based on Cr of 0.6). Liver Function Tests:  Lab 05/18/12 0955 05/17/12 2033  AST  16 21  ALT 19 26  ALKPHOS 42 50  BILITOT 1.1 0.9  PROT 5.2* 5.9*  ALBUMIN 2.6* 3.2*    CBC:  Lab 05/19/12 0355 05/18/12 0955 05/17/12 2033  WBC 0.7* 0.4* 0.4*  NEUTROABS -- -- 0.0*  HGB 7.9* 9.1* 6.2*  HCT 22.6* 25.9* 18.1*  MCV 85.9 85.5 85.0  PLT 46* <5* <5*   CBG:  Lab  05/18/12 2104 05/18/12 1722 05/18/12 1218 05/18/12 0816 05/17/12 1954  GLUCAP 167* 186* 158* 192* 225*   Microbiology No results found for this or any previous visit (from the past 240 hour(s)).   Procedures and Diagnostic Studies: Dg Chest Port 1 View  05/17/2012  *RADIOLOGY REPORT*  Clinical Data: History of AML with fever.  PORTABLE CHEST - 1 VIEW  Comparison: 10/17/2011  Findings: Single view of the chest demonstrates a Port-A-Cath. Port-A-Cath tip is in the SVC region.  There is a new round opacity in the right hilum.  This opacity roughly measures 4.0 x 2.4 cm. In addition, there are interstitial densities in the right lower lung.  Heart size is normal.  Atherosclerotic calcifications of the aortic arch.  IMPRESSION: New nodular opacity in the right hilum and slightly prominent interstitial densities at the right lung base.  Findings could represent multifocal pneumonia but cannot exclude a right hilar mass or lymphadenopathy.  These findings could better characterized with a chest CT.   Original Report Authenticated By: Richarda Overlie, M.D.     Scheduled Meds:    . amLODipine  2.5 mg Oral q morning - 10a  . aztreonam  2 g Intravenous Q8H  . calcium carbonate  1 tablet Oral Daily  . feeding supplement  237 mL Oral BID BM  . filgrastim (NEUPOGEN)  SQ  300 mcg Subcutaneous q1800  . insulin aspart  0-15 Units Subcutaneous TID WC  . insulin aspart  0-5 Units Subcutaneous QHS  . insulin aspart  3 Units Subcutaneous TID WC  . levofloxacin (LEVAQUIN) IV  750 mg Intravenous Q48H  . metoprolol  50 mg Oral BID  . omega-3 acid ethyl esters  1 g Oral Daily  . pregabalin  75 mg Oral BID  . simvastatin  5 mg Oral QHS  . vancomycin  750 mg Intravenous Q24H   Continuous Infusions:    . sodium chloride 75 mL/hr at 05/18/12 1215    Time spent: 25 minutes   LOS: 2 days   Brennan Karam  [facility] Pager [phone].  If 8PM-8AM, please contact night-coverage at [email or web address], password  [facility] 05/19/2012, 7:26 AM

## 2012-05-20 DIAGNOSIS — K5900 Constipation, unspecified: Secondary | ICD-10-CM | POA: Diagnosis present

## 2012-05-20 LAB — GLUCOSE, CAPILLARY
Glucose-Capillary: 200 mg/dL — ABNORMAL HIGH (ref 70–99)
Glucose-Capillary: 151 mg/dL — ABNORMAL HIGH (ref 70–99)
Glucose-Capillary: 132 mg/dL — ABNORMAL HIGH (ref 70–99)

## 2012-05-20 LAB — CBC WITH DIFFERENTIAL/PLATELET
Band Neutrophils: 0 % (ref 0–10)
Basophils Absolute: 0 10*3/uL (ref 0.0–0.1)
Eosinophils Absolute: 0 10*3/uL (ref 0.0–0.7)
HCT: 32.2 % — ABNORMAL LOW (ref 36.0–46.0)
Lymphocytes Relative: 0 % — ABNORMAL LOW (ref 12–46)
MCHC: 34.2 g/dL (ref 30.0–36.0)
Monocytes Absolute: 0 10*3/uL — ABNORMAL LOW (ref 0.1–1.0)
Platelets: 25 10*3/uL — CL (ref 150–400)
RDW: 14.4 % (ref 11.5–15.5)
nRBC: 0 /100 WBC

## 2012-05-20 LAB — BASIC METABOLIC PANEL
CO2: 25 mEq/L (ref 19–32)
Calcium: 8.4 mg/dL (ref 8.4–10.5)
Creatinine, Ser: 0.53 mg/dL (ref 0.50–1.10)
Glucose, Bld: 77 mg/dL (ref 70–99)

## 2012-05-20 MED ORDER — MAGNESIUM CITRATE PO SOLN
1.0000 | Freq: Once | ORAL | Status: AC
  Administered 2012-05-20: 1 via ORAL

## 2012-05-20 NOTE — Consult Note (Signed)
Meredith Rodriguez   DOB:04/29/1926   ZO#:[id number]   JWJ#:[id number]  Subjective: Meredith Rodriguez is an 79 y/o female who has MDS/AML currently undergoing treatment with Decitabine.  She is cycle 3 day 17 who was admitted to the hospital with febrile neutropenia.  Tmax overnight was 39.1.  She does not feel well today, she's slightly more short of breath than usual.  She denies chest pain, or any other concerns.     Objective:  Filed Vitals:   05/20/12 0438  BP: 154/58  Pulse: 70  Temp: 97.4 F (36.3 C)  Resp: 18    Body mass index is 17.01 kg/(m^2).  Intake/Output Summary (Last 24 hours) at 05/20/12 0946 Last data filed at 05/20/12 0848  Gross per 24 hour  Intake   2111 ml  Output   1300 ml  Net    811 ml    General: Patient is a well appearing female in no acute distress HEENT: PERRLA, sclerae anicteric no conjunctival pallor, MMM Neck: supple, no palpable adenopathy Lungs: clear to auscultation bilaterally, no wheezes, rhonchi, or rales Cardiovascular: regular rate rhythm, S1, S2, no murmurs, rubs or gallops Abdomen: Soft, non-tender, non-distended, normoactive bowel sounds, no HSM Extremities: warm and well perfused, no clubbing, cyanosis, or edema Skin: No rashes or lesions Neuro: Non-focal   CBG (last 3)   Basename 05/20/12 0741 05/19/12 2144 05/19/12 1638  GLUCAP 72 248* 139*     Labs:  Lab Results  Component Value Date   WBC 0.7* 05/20/2012   HGB 11.0* 05/20/2012   HCT 32.2* 05/20/2012   MCV 87.5 05/20/2012   PLT 25* 05/20/2012   NEUTROABS 0.0* 05/17/2012    Urine Studies No results found for this basename: UACOL:2,UAPR:2,USPG:2,UPH:2,UTP:2,UGL:2,UKET:2,UBIL:2,UHGB:2,UNIT:2,UROB:2,ULEU:2,UEPI:2,UWBC:2,URBC:2,UBAC:2,CAST:2,CRYS:2,UCOM:2,BILUA:2 in the last 72 hours  Basic Metabolic Panel:  Lab 05/20/12 6213 05/18/12 0955 05/17/12 2033  NA 139 138 134*  K 3.7 3.3* --  CL 109 103 98  CO2 25 25 26   GLUCOSE 77 200* 234*  BUN 13 13 20   CREATININE 0.53 0.60 0.62  CALCIUM 8.4  8.3* 8.7  MG -- -- --  PHOS -- -- --   GFR Estimated Creatinine Clearance: 37.2 ml/min (by C-G formula based on Cr of 0.53). Liver Function Tests:  Lab 05/18/12 0955 05/17/12 2033  AST 16 21  ALT 19 26  ALKPHOS 42 50  BILITOT 1.1 0.9  PROT 5.2* 5.9*  ALBUMIN 2.6* 3.2*   No results found for this basename: LIPASE:5,AMYLASE:5 in the last 168 hours No results found for this basename: AMMONIA:5 in the last 168 hours Coagulation profile No results found for this basename: INR:5,PROTIME:5 in the last 168 hours  CBC:  Lab 05/20/12 0650 05/19/12 0355 05/18/12 0955 05/17/12 2033  WBC 0.7* 0.7* 0.4* 0.4*  NEUTROABS -- -- -- 0.0*  HGB 11.0* 7.9* 9.1* 6.2*  HCT 32.2* 22.6* 25.9* 18.1*  MCV 87.5 85.9 85.5 85.0  PLT 25* 46* <5* <5*   Cardiac Enzymes: No results found for this basename: CKTOTAL:5,CKMB:5,CKMBINDEX:5,TROPONINI:5 in the last 168 hours BNP: No components found with this basename: POCBNP:5 CBG:  Lab 05/20/12 0741 05/19/12 2144 05/19/12 1638 05/19/12 1148 05/19/12 0743  GLUCAP 72 248* 139* 232* 179*   D-Dimer No results found for this basename: DDIMER:2 in the last 72 hours Hgb A1c No results found for this basename: HGBA1C:2 in the last 72 hours Lipid Profile No results found for this basename: CHOL:2,HDL:2,LDLCALC:2,TRIG:2,CHOLHDL:2,LDLDIRECT:2 in the last 72 hours Thyroid function studies No results found for this basename: TSH,T4TOTAL,FREET3,T3FREE,THYROIDAB in the  last 72 hours Anemia work up No results found for this basename: VITAMINB12:2,FOLATE:2,FERRITIN:2,TIBC:2,IRON:2,RETICCTPCT:2 in the last 72 hours Microbiology Recent Results (from the past 240 hour(s))  URINE CULTURE     Status: Normal   Collection Time   05/17/12  7:16 PM      Component Value Range Status Comment   Specimen Description URINE, RANDOM   Final    Special Requests NONE   Final    Culture  Setup Time 05/18/2012 11:46   Final    Colony Count 9,000 COLONIES/ML   Final    Culture  INSIGNIFICANT GROWTH   Final    Report Status 05/19/2012 FINAL   Final   CULTURE, BLOOD (ROUTINE X 2)     Status: Normal (Preliminary result)   Collection Time   05/17/12  8:33 PM      Component Value Range Status Comment   Specimen Description BLOOD LEFT ARM   Final    Special Requests BOTTLES DRAWN AEROBIC AND ANAEROBIC   Final    Culture  Setup Time 05/18/2012 14:10   Final    Culture     Final    Value:        BLOOD CULTURE RECEIVED NO GROWTH TO DATE CULTURE WILL BE HELD FOR 5 DAYS BEFORE ISSUING A FINAL NEGATIVE REPORT   Report Status PENDING   Incomplete   CULTURE, BLOOD (ROUTINE X 2)     Status: Normal (Preliminary result)   Collection Time   05/17/12  8:45 PM      Component Value Range Status Comment   Specimen Description BLOOD LEFT ARM   Final    Special Requests BOTTLES DRAWN AEROBIC AND ANAEROBIC   Final    Culture  Setup Time 05/18/2012 14:10   Final    Culture     Final    Value:        BLOOD CULTURE RECEIVED NO GROWTH TO DATE CULTURE WILL BE HELD FOR 5 DAYS BEFORE ISSUING A FINAL NEGATIVE REPORT   Report Status PENDING   Incomplete       Studies:  No results found.  Assessment: 79 y.o. with AML admitted with febrile neutropenia and panyctopenia, day 15 cycle 3 of Decitabine chemotherapy.      Plan:  1. Febrile Neutropenia: Continue antibiotics.  Blood cultures and urine culture show no growth to date.  CXR shows infiltrate.  Pt on Levaquin, Vanc, and Azactam. Continue Neupogen daily.  2. Anemia/Thrombocytopenia: Continue to transfuse for hemoglobin less than 8, platelets less than 20.    3. AML: Currently day 17 of therapy.  Will likely dose reduce next cycle.    4. Blurred vision: MRI neg. Likely due to severe anemia.    5. FEN: Continue IVF and supplement electrolytes PRN.    6. Shortness of breath: Pt has received multiple transfusions and fluids this admission, consider CXR and/or IV lasix. Also consider CT chest to evaluate for possible fungal  infection.     Cherie Ouch Lyn Hollingshead, NP Medical Oncology [facility] Phone: [phone]    Augustin Schooling   ATTENDING'S ATTESTATION:  I personally reviewed patient's chart, examined patient myself, formulated the treatment plan as followed.   Recommend continuing broad spectrum antibiotics.Blood transfusion today (ordered). Spoke to patient and her husband.  Drue Second, MD Medical/Oncology [facility] [phone] (beeper) [phone] (Office)  05/20/2012, 9:46 AM

## 2012-05-20 NOTE — Progress Notes (Signed)
[facility] PROGRESS NOTE  Meredith Rodriguez ZOX:[id number] DOB: 1926-02-17 DOA: 05/17/2012 PCP: [name], MD Oncologist: Dr. Welton Flakes  Brief narrative: Ms. Rodriguez is an 79 year old woman with a past medical history of AML/MDS, under the care of Dr. Welton Flakes, status post chemotherapy approximately 2 weeks ago, who presented to the hospital on 05/17/2012 with weakness, fever, and chills. Upon initial evaluation emergency department, the patient was found to be neutropenic and had evidence of pneumonia on chest radiography.  Assessment/Plan: Principal Problem:  *Febrile neutropenia with healthcare associated pneumonia  Admitted and placed on empiric antibiotics including vancomycin, Levaquin, and aztreonam.  Blood cultures, influenza panel negative. Procalcitonin and lactic acid not elevated.  Continue Neupogen. Active Problems:  Constipation  One bottle of magnesium citrate ordered for today.  Hypokalemia  Resolved with potassium added to IV fluids.  Blurred vision  MRI of the brain obtained which did not show any obvious metastasis.  DIABETES MELLITUS II, UNCOMPLICATED  CBGs 72-248. Continue sliding scale insulin moderate scale and meal coverage of 4 units of NovoLog. Continue 10 units of Lantus each bedtime.  HYPERLIPIDEMIA  Continue statin and fish oil.  HYPERTENSION, BENIGN SYSTEMIC  Blood pressure controlled on Norvasc and metoprolol.  Normocytic Anemia  Likely anemia of chronic disease and the sequela of prior chemotherapy.  Status post 2 units of packed red blood cells 05/18/2012 and an additional 2 units of packed red blood cells 05/19/2012. Continue to monitor hemoglobin transfuse for hemoglobin less than 8.  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 Welton Flakes.  Thrombocytopenia  Likely the sequela of prior chemotherapy. Status post one pack of platelets. Platelet count improved. No further indication for transfusion today.  Pancytopenia  Secondary to chemotherapy. Transfused as noted above. Neupogen for low white blood cell count.  Severe protein-calorie malnutrition in the context of chronic illness / underweight  Dietitian consultation performed 05/18/2012. Glucerna shakes ordered.  Code Status: Full Family Communication: Daughter updated at bedside. Disposition Plan: Home when stable.   Medical Consultants:  Dr. Drue Second, Oncology  Other Consultants:  Dietitian  Anti-infectives:  Levaquin 05/17/2012--->  Vancomycin 05/17/2012--->  Aztreonam 05/17/2012--->  HPI/Subjective: Meredith Rodriguez feels a bit short of breath from nasal congestion and blood clots in the nasal passages. No fevers. Appetite fair. No bowel movement in several days.  Objective: Filed Vitals:   05/20/12 0203 05/20/12 0234 05/20/12 0337 05/20/12 0438  BP: 154/56 124/46 150/50 154/58  Pulse: 74 70 70 70  Temp: 98 F (36.7 C) 98 F (36.7 C) 98.2 F (36.8 C) 97.4 F (36.3 C)  TempSrc: Oral Oral Oral Oral  Resp: 18 18 18 18   Height:      Weight:      SpO2:        Intake/Output Summary (Last 24 hours) at 05/20/12 0814 Last data filed at 05/20/12 0438  Gross per 24 hour  Intake   1871 ml  Output   1300 ml  Net    571 ml    Exam: Gen:  NAD Cardiovascular:  RRR, No M/R/G Respiratory:  Lungs diminished Gastrointestinal:  Abdomen soft, NT/ND, + BS Extremities:  No C/E/C  Data Reviewed: Basic Metabolic Panel:  Lab 05/20/12 8119 05/18/12 0955 05/17/12 2033  NA 139 138 134*  K 3.7 3.3* --  CL 109 103 98  CO2 25 25 26   GLUCOSE 77 200* 234*  BUN 13 13 20   CREATININE 0.53 0.60 0.62  CALCIUM 8.4 8.3* 8.7  MG -- -- --  PHOS -- -- --   GFR Estimated Creatinine Clearance: 37.2 ml/min (by C-G formula based on Cr of 0.53). Liver Function Tests:  Lab 05/18/12 0955 05/17/12 2033  AST 16 21  ALT 19 26  ALKPHOS 42 50  BILITOT 1.1 0.9  PROT 5.2* 5.9*  ALBUMIN 2.6* 3.2*    CBC:  Lab 05/20/12 0650 05/19/12  0355 05/18/12 0955 05/17/12 2033  WBC 0.7* 0.7* 0.4* 0.4*  NEUTROABS -- -- -- 0.0*  HGB 11.0* 7.9* 9.1* 6.2*  HCT 32.2* 22.6* 25.9* 18.1*  MCV 87.5 85.9 85.5 85.0  PLT 25* 46* <5* <5*   CBG:  Lab 05/20/12 0741 05/19/12 2144 05/19/12 1638 05/19/12 1148 05/19/12 0743  GLUCAP 72 248* 139* 232* 179*   Microbiology Recent Results (from the past 240 hour(s))  URINE CULTURE     Status: Normal   Collection Time   05/17/12  7:16 PM      Component Value Range Status Comment   Specimen Description URINE, RANDOM   Final    Special Requests NONE   Final    Culture  Setup Time 05/18/2012 11:46   Final    Colony Count 9,000 COLONIES/ML   Final    Culture INSIGNIFICANT GROWTH   Final    Report Status 05/19/2012 FINAL   Final   CULTURE, BLOOD (ROUTINE X 2)     Status: Normal (Preliminary result)   Collection Time   05/17/12  8:33 PM      Component Value Range Status Comment   Specimen Description BLOOD LEFT ARM   Final    Special Requests BOTTLES DRAWN AEROBIC AND ANAEROBIC   Final    Culture  Setup Time 05/18/2012 14:10   Final    Culture     Final    Value:        BLOOD CULTURE RECEIVED NO GROWTH TO DATE CULTURE WILL BE HELD FOR 5 DAYS BEFORE ISSUING A FINAL NEGATIVE REPORT   Report Status PENDING   Incomplete   CULTURE, BLOOD (ROUTINE X 2)     Status: Normal (Preliminary result)   Collection Time   05/17/12  8:45 PM      Component Value Range Status Comment   Specimen Description BLOOD LEFT ARM   Final    Special Requests BOTTLES DRAWN AEROBIC AND ANAEROBIC   Final    Culture  Setup Time 05/18/2012 14:10   Final    Culture     Final    Value:        BLOOD CULTURE RECEIVED NO GROWTH TO DATE CULTURE WILL BE HELD FOR 5 DAYS BEFORE ISSUING A FINAL NEGATIVE REPORT   Report Status PENDING   Incomplete      Procedures and Diagnostic Studies: Dg Chest Port 1 View  05/17/2012  *RADIOLOGY REPORT*  Clinical Data: History of AML with fever.  PORTABLE CHEST - 1 VIEW  Comparison:  10/17/2011  Findings: Single view of the chest demonstrates a Port-A-Cath. Port-A-Cath tip is in the SVC region.  There is a new round opacity in the right hilum.  This opacity roughly measures 4.0 x 2.4 cm. In addition, there are interstitial densities in the right lower lung.  Heart size is normal.  Atherosclerotic calcifications of the aortic arch.  IMPRESSION: New nodular opacity in the right hilum and slightly prominent interstitial densities at the right lung base.  Findings could represent multifocal pneumonia but cannot exclude a right hilar mass or lymphadenopathy.  These findings could better characterized with  a chest CT.   Original Report Authenticated By: Richarda Overlie, M.D.     Scheduled Meds:    . amLODipine  2.5 mg Oral q morning - 10a  . aztreonam  2 g Intravenous Q8H  . calcium carbonate  1 tablet Oral Daily  . feeding supplement  237 mL Oral BID BM  . filgrastim (NEUPOGEN)  SQ  300 mcg Subcutaneous q1800  . insulin aspart  0-15 Units Subcutaneous TID WC  . insulin aspart  0-5 Units Subcutaneous QHS  . insulin aspart  4 Units Subcutaneous TID WC  . insulin glargine  10 Units Subcutaneous QHS  . levofloxacin (LEVAQUIN) IV  750 mg Intravenous Q48H  . metoprolol  50 mg Oral BID  . omega-3 acid ethyl esters  1 g Oral Daily  . pregabalin  75 mg Oral BID  . simvastatin  5 mg Oral QHS  . vancomycin  750 mg Intravenous Q24H   Continuous Infusions:    . 0.9 % NaCl with KCl 20 mEq / L 75 mL/hr at 05/20/12 0749    Time spent: 25 minutes   LOS: 3 days   Darnesha Diloreto  [facility] Pager [phone].  If 8PM-8AM, please contact night-coverage at [email or web address], password [facility] 05/20/2012, 8:14 AM

## 2012-05-21 LAB — CBC WITH DIFFERENTIAL/PLATELET
Basophils Absolute: 0 10*3/uL (ref 0.0–0.1)
Eosinophils Relative: 1 % (ref 0–5)
Lymphs Abs: 0.8 10*3/uL (ref 0.7–4.0)
MCH: 30.2 pg (ref 26.0–34.0)
MCV: 87.6 fL (ref 78.0–100.0)
Monocytes Absolute: 0.2 10*3/uL (ref 0.1–1.0)
Neutrophils Relative %: 15 % — ABNORMAL LOW (ref 43–77)
Platelets: 21 10*3/uL — CL (ref 150–400)
RBC: 3.54 MIL/uL — ABNORMAL LOW (ref 3.87–5.11)
RDW: 14.4 % (ref 11.5–15.5)
WBC: 1.2 10*3/uL — CL (ref 4.0–10.5)

## 2012-05-21 LAB — GLUCOSE, CAPILLARY: Glucose-Capillary: 67 mg/dL — ABNORMAL LOW (ref 70–99)

## 2012-05-21 LAB — TYPE AND SCREEN
ABO/RH(D): AB NEG
Antibody Screen: NEGATIVE
Donor AG Type: NEGATIVE
Donor AG Type: NEGATIVE
Unit division: 0
Unit division: 0
Unit division: 0

## 2012-05-21 NOTE — Progress Notes (Signed)
ANTIBIOTIC CONSULT NOTE - Follow Up  Pharmacy Consult for Azactam/Levaquin/Vancomycin Indication: Sepsis from possible HCAP/Febrile Neutropenia   Allergies  Allergen Reactions  . Amoxicillin (Amoxicillin)     Upset stomach  . Cephalexin Nausea Only  . Metformin And Related     Gi problems  . Phenergan (Promethazine Hcl) Other (See Comments)    Unknown   . Procardia (Nifedipine)     dizziness    Patient Measurements: Height: 5\' 4"  (162.6 cm) Weight: 99 lb 1.6 oz (44.951 kg) IBW/kg (Calculated) : 54.7    Vital Signs: Temp: 97.9 F (36.6 C) (01/23 1914) Temp src: Oral (01/23 7829) BP: 129/56 mmHg (01/23 5621) Pulse Rate: 79  (01/23 3086) Intake/Output from previous day: 01/22 0701 - 01/23 0700 In: 1750.3 [P.O.:480; I.V.:1270.3] Out: 800 [Urine:800] Intake/Output from this shift:    Labs:  Basename 05/21/12 0530 05/20/12 0650 05/19/12 0355 05/18/12 0955  WBC 1.2* 0.7* 0.7* --  HGB 10.7* 11.0* 7.9* --  PLT 21* 25* 46* --  LABCREA -- -- -- --  CREATININE -- 0.53 -- 0.60   Estimated Creatinine Clearance: 37.2 ml/min (by C-G formula based on Cr of 0.53). No results found for this basename: VANCOTROUGH:2,VANCOPEAK:2,VANCORANDOM:2,GENTTROUGH:2,GENTPEAK:2,GENTRANDOM:2,TOBRATROUGH:2,TOBRAPEAK:2,TOBRARND:2,AMIKACINPEAK:2,AMIKACINTROU:2,AMIKACIN:2, in the last 72 hours   Microbiology: Recent Results (from the past 720 hour(s))  URINE CULTURE     Status: Normal   Collection Time   05/17/12  7:16 PM      Component Value Range Status Comment   Specimen Description URINE, RANDOM   Final    Special Requests NONE   Final    Culture  Setup Time 05/18/2012 11:46   Final    Colony Count 9,000 COLONIES/ML   Final    Culture INSIGNIFICANT GROWTH   Final    Report Status 05/19/2012 FINAL   Final   CULTURE, BLOOD (ROUTINE X 2)     Status: Normal (Preliminary result)   Collection Time   05/17/12  8:33 PM      Component Value Range Status Comment   Specimen Description BLOOD LEFT  ARM   Final    Special Requests BOTTLES DRAWN AEROBIC AND ANAEROBIC   Final    Culture  Setup Time 05/18/2012 14:10   Final    Culture     Final    Value:        BLOOD CULTURE RECEIVED NO GROWTH TO DATE CULTURE WILL BE HELD FOR 5 DAYS BEFORE ISSUING A FINAL NEGATIVE REPORT   Report Status PENDING   Incomplete   CULTURE, BLOOD (ROUTINE X 2)     Status: Normal (Preliminary result)   Collection Time   05/17/12  8:45 PM      Component Value Range Status Comment   Specimen Description BLOOD LEFT ARM   Final    Special Requests BOTTLES DRAWN AEROBIC AND ANAEROBIC   Final    Culture  Setup Time 05/18/2012 14:10   Final    Culture     Final    Value:        BLOOD CULTURE RECEIVED NO GROWTH TO DATE CULTURE WILL BE HELD FOR 5 DAYS BEFORE ISSUING A FINAL NEGATIVE REPORT   Report Status PENDING   Incomplete     Medical History: Past Medical History  Diagnosis Date  . Hypertension   . Diabetes mellitus   . Hyperkalemia   . IBS (irritable bowel syndrome)   . Hypercholesterolemia   . Cervical radiculopathy   . Anemia   . Cancer  leukemia    Medications:  Scheduled:     . amLODipine  2.5 mg Oral q morning - 10a  . aztreonam  2 g Intravenous Q8H  . calcium carbonate  1 tablet Oral Daily  . feeding supplement  237 mL Oral BID BM  . filgrastim (NEUPOGEN)  SQ  300 mcg Subcutaneous q1800  . insulin aspart  0-15 Units Subcutaneous TID WC  . insulin aspart  0-5 Units Subcutaneous QHS  . insulin aspart  4 Units Subcutaneous TID WC  . insulin glargine  10 Units Subcutaneous QHS  . levofloxacin (LEVAQUIN) IV  750 mg Intravenous Q48H  . [COMPLETED] magnesium citrate  1 Bottle Oral Once  . metoprolol  50 mg Oral BID  . omega-3 acid ethyl esters  1 g Oral Daily  . pregabalin  75 mg Oral BID  . simvastatin  5 mg Oral QHS  . vancomycin  750 mg Intravenous Q24H   Infusions:     . 0.9 % NaCl with KCl 20 mEq / L 20 mL/hr at 05/20/12 1018   Assessment:  79 yo with history of  AML/MDS on chemotherapy (last 1/10) admitted with febrile neutopenia, x-ray suggestive of PNA.   D# 4 Vanc 750mg  IV q24h/Aztreonam 2g IV q8h/Levaquin 750mg  IV q48h  Estimated CrCl of 37, Scr stable  WBC 1.2, ANC 0.2. Afebrile   Goal of Therapy:  Vancomycin trough level 15-20 mcg/ml  Plan:  Vancomycin trough tonight Continue current doses of levaquin and aztreonam  Gwen Her PharmD  [phone] 05/21/2012 8:46 AM

## 2012-05-21 NOTE — Progress Notes (Addendum)
[facility] PROGRESS NOTE  Meredith Rodriguez ZOX:[id number] DOB: [birth year] DOA: 05/17/2012 PCP: [name], MD Oncologist: Dr. Welton Flakes  Brief narrative: Meredith Rodriguez is an 79 year old woman with a past medical history of AML/MDS, under the care of Dr. Welton Flakes, status post chemotherapy approximately 2 weeks ago, who presented to the hospital on 05/17/2012 with weakness, fever, and chills. Upon initial evaluation emergency department, the patient was found to be neutropenic and had evidence of pneumonia on chest radiography.  Assessment/Plan: Principal Problem:  *Febrile neutropenia with healthcare associated pneumonia  Admitted and placed on empiric antibiotics including vancomycin, Levaquin, and aztreonam.  Blood cultures, influenza panel negative. Procalcitonin and lactic acid not elevated.  Continue Neupogen. Active Problems:  Constipation  One bottle of magnesium citrate given 05/20/12.  Hypokalemia  Resolved with potassium added to IV fluids.  Blurred vision  MRI of the brain obtained which did not show any obvious metastasis.  DIABETES MELLITUS II, UNCOMPLICATED  CBGs 67-200. Continue sliding scale insulin moderate scale and meal coverage of 4 units of NovoLog. Stop Lantus given morning low.  HYPERLIPIDEMIA  Continue statin and fish oil.  HYPERTENSION, BENIGN SYSTEMIC  Blood pressure controlled on Norvasc and metoprolol.  Normocytic Anemia  Likely anemia of chronic disease and the sequela of prior chemotherapy.  Status post 2 units of packed red blood cells 05/18/2012 and an additional 2 units of packed red blood cells 05/19/2012. Continue to monitor hemoglobin transfuse for hemoglobin less than 8.  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 Welton Flakes.  Thrombocytopenia  Likely the sequela of prior chemotherapy. Status post one pack of platelets. Platelet count improved. No further indication for transfusion today.   Pancytopenia  Secondary to chemotherapy. Transfused as noted above. Neupogen for low white blood cell count.  Severe protein-calorie malnutrition in the context of chronic illness / underweight  Dietitian consultation performed 05/18/2012. Glucerna shakes ordered.  Code Status: Full Family Communication: No family at bedside today. Disposition Plan: Home when stable.   Medical Consultants:  Dr. Drue Second, Oncology  Other Consultants:  Dietitian  Anti-infectives:  Levaquin 05/17/2012--->  Vancomycin 05/17/2012--->  Aztreonam 05/17/2012--->  HPI/Subjective: Meredith Rodriguez is starting to feel a bit better. She says the nasal congestion is improving. Bowels moved yesterday. No nausea or vomiting. States her appetite is good. Still with low-grade fevers.  Objective: Filed Vitals:   05/20/12 1412 05/20/12 2128 05/20/12 2141 05/21/12 0638  BP: 140/72 170/78 159/62 129/56  Pulse: 99  96 79  Temp: 99.4 F (37.4 C)  99.9 F (37.7 C) 97.9 F (36.6 C)  TempSrc: Oral  Oral Oral  Resp: 18  18 16   Height:      Weight:      SpO2: 91%  96% 97%    Intake/Output Summary (Last 24 hours) at 05/21/12 0937 Last data filed at 05/21/12 0900  Gross per 24 hour  Intake 1510.25 ml  Output    800 ml  Net 710.25 ml    Exam: Gen:  NAD Cardiovascular:  RRR, No M/R/G Respiratory:  Lungs diminished Gastrointestinal:  Abdomen soft, NT/ND, + BS Extremities:  No C/E/C  Data Reviewed: Basic Metabolic Panel:  Lab 05/20/12 8119 05/18/12 0955 05/17/12 2033  NA 139 138 134*  K 3.7 3.3* --  CL 109 103 98  CO2 25 25 26   GLUCOSE 77 200* 234*  BUN 13 13 20   CREATININE 0.53 0.60 0.62  CALCIUM 8.4 8.3* 8.7  MG -- -- --  PHOS -- -- --  GFR Estimated Creatinine Clearance: 37.2 ml/min (by C-G formula based on Cr of 0.53). Liver Function Tests:  Lab 05/18/12 0955 05/17/12 2033  AST 16 21  ALT 19 26  ALKPHOS 42 50  BILITOT 1.1 0.9  PROT 5.2* 5.9*  ALBUMIN 2.6* 3.2*    CBC:  Lab  05/21/12 0530 05/20/12 0650 05/19/12 0355 05/18/12 0955 05/17/12 2033  WBC 1.2* 0.7* 0.7* 0.4* 0.4*  NEUTROABS 0.2* -- -- -- 0.0*  HGB 10.7* 11.0* 7.9* 9.1* 6.2*  HCT 31.0* 32.2* 22.6* 25.9* 18.1*  MCV 87.6 87.5 85.9 85.5 85.0  PLT 21* 25* 46* <5* <5*   CBG:  Lab 05/21/12 0740 05/20/12 2139 05/20/12 1649 05/20/12 1159 05/20/12 0741  GLUCAP 67* 132* 151* 200* 72   Microbiology Recent Results (from the past 240 hour(s))  URINE CULTURE     Status: Normal   Collection Time   05/17/12  7:16 PM      Component Value Range Status Comment   Specimen Description URINE, RANDOM   Final    Special Requests NONE   Final    Culture  Setup Time 05/18/2012 11:46   Final    Colony Count 9,000 COLONIES/ML   Final    Culture INSIGNIFICANT GROWTH   Final    Report Status 05/19/2012 FINAL   Final   CULTURE, BLOOD (ROUTINE X 2)     Status: Normal (Preliminary result)   Collection Time   05/17/12  8:33 PM      Component Value Range Status Comment   Specimen Description BLOOD LEFT ARM   Final    Special Requests BOTTLES DRAWN AEROBIC AND ANAEROBIC   Final    Culture  Setup Time 05/18/2012 14:10   Final    Culture     Final    Value:        BLOOD CULTURE RECEIVED NO GROWTH TO DATE CULTURE WILL BE HELD FOR 5 DAYS BEFORE ISSUING A FINAL NEGATIVE REPORT   Report Status PENDING   Incomplete   CULTURE, BLOOD (ROUTINE X 2)     Status: Normal (Preliminary result)   Collection Time   05/17/12  8:45 PM      Component Value Range Status Comment   Specimen Description BLOOD LEFT ARM   Final    Special Requests BOTTLES DRAWN AEROBIC AND ANAEROBIC   Final    Culture  Setup Time 05/18/2012 14:10   Final    Culture     Final    Value:        BLOOD CULTURE RECEIVED NO GROWTH TO DATE CULTURE WILL BE HELD FOR 5 DAYS BEFORE ISSUING A FINAL NEGATIVE REPORT   Report Status PENDING   Incomplete      Procedures and Diagnostic Studies: Dg Chest Port 1 View  05/17/2012  *RADIOLOGY REPORT*  Clinical Data: History  of AML with fever.  PORTABLE CHEST - 1 VIEW  Comparison: 10/17/2011  Findings: Single view of the chest demonstrates a Port-A-Cath. Port-A-Cath tip is in the SVC region.  There is a new round opacity in the right hilum.  This opacity roughly measures 4.0 x 2.4 cm. In addition, there are interstitial densities in the right lower lung.  Heart size is normal.  Atherosclerotic calcifications of the aortic arch.  IMPRESSION: New nodular opacity in the right hilum and slightly prominent interstitial densities at the right lung base.  Findings could represent multifocal pneumonia but cannot exclude a right hilar mass or lymphadenopathy.  These findings could better  characterized with a chest CT.   Original Report Authenticated By: Richarda Overlie, M.D.     Scheduled Meds:    . amLODipine  2.5 mg Oral q morning - 10a  . aztreonam  2 g Intravenous Q8H  . calcium carbonate  1 tablet Oral Daily  . feeding supplement  237 mL Oral BID BM  . filgrastim (NEUPOGEN)  SQ  300 mcg Subcutaneous q1800  . insulin aspart  0-15 Units Subcutaneous TID WC  . insulin aspart  0-5 Units Subcutaneous QHS  . insulin aspart  4 Units Subcutaneous TID WC  . levofloxacin (LEVAQUIN) IV  750 mg Intravenous Q48H  . metoprolol  50 mg Oral BID  . omega-3 acid ethyl esters  1 g Oral Daily  . pregabalin  75 mg Oral BID  . simvastatin  5 mg Oral QHS  . vancomycin  750 mg Intravenous Q24H   Continuous Infusions:    . 0.9 % NaCl with KCl 20 mEq / L 20 mL/hr at 05/20/12 1018    Time spent: 25 minutes   LOS: 4 days   Add Dinapoli  [facility] Pager [phone].  If 8PM-8AM, please contact night-coverage at [email or web address], password [facility] 05/21/2012, 9:37 AM

## 2012-05-22 DIAGNOSIS — C9200 Acute myeloblastic leukemia, not having achieved remission: Secondary | ICD-10-CM

## 2012-05-22 DIAGNOSIS — I10 Essential (primary) hypertension: Secondary | ICD-10-CM

## 2012-05-22 LAB — CBC WITH DIFFERENTIAL/PLATELET
Basophils Absolute: 0 10*3/uL (ref 0.0–0.1)
Basophils Relative: 0 % (ref 0–1)
Eosinophils Relative: 2 % (ref 0–5)
HCT: 31.8 % — ABNORMAL LOW (ref 36.0–46.0)
Hemoglobin: 11 g/dL — ABNORMAL LOW (ref 12.0–15.0)
Lymphocytes Relative: 57 % — ABNORMAL HIGH (ref 12–46)
Lymphs Abs: 1 10*3/uL (ref 0.7–4.0)
MCV: 87.8 fL (ref 78.0–100.0)
Monocytes Relative: 1 % — ABNORMAL LOW (ref 3–12)
Neutro Abs: 0.7 10*3/uL — ABNORMAL LOW (ref 1.7–7.7)
RBC: 3.62 MIL/uL — ABNORMAL LOW (ref 3.87–5.11)
WBC: 1.7 10*3/uL — ABNORMAL LOW (ref 4.0–10.5)

## 2012-05-22 LAB — GLUCOSE, CAPILLARY
Glucose-Capillary: 152 mg/dL — ABNORMAL HIGH (ref 70–99)
Glucose-Capillary: 148 mg/dL — ABNORMAL HIGH (ref 70–99)
Glucose-Capillary: 185 mg/dL — ABNORMAL HIGH (ref 70–99)
Glucose-Capillary: 204 mg/dL — ABNORMAL HIGH (ref 70–99)

## 2012-05-22 MED ORDER — VANCOMYCIN HCL IN DEXTROSE 1-5 GM/200ML-% IV SOLN
1000.0000 mg | INTRAVENOUS | Status: DC
  Administered 2012-05-22 – 2012-05-25 (×4): 1000 mg via INTRAVENOUS
  Filled 2012-05-22 (×5): qty 200

## 2012-05-22 NOTE — Progress Notes (Signed)
CRITICAL VALUE ALERT  Critical value received:  Platelets 15  Date of notification: 05/22/12  Time of notification: 0530  Critical value read back:Yes  Nurse who received alert:  Kenton Kingfisher, RN  MD notified (1st page):  [facility]  Time of first page: 0530 MD notified (2nd page): N/A  Time of second page: N/A  Responding MD:  MD was sent page w/ detailed message.  Previous platelet count from day before was critical as well so MD aware.  Reported this value to day shift RN.    Time MD responded:  N/A; No response

## 2012-05-22 NOTE — Progress Notes (Signed)
NUTRITION FOLLOW UP  Intervention:   Continue Glucerna Shake po BID, each supplement provides 220 kcal and 10 grams of protein.  Nutrition Dx:   Increased nutrient needs related to acute myeloid leukemia/myelodysplastic syndrome with recent unintended weight loss as evidenced by estimated needs; ongoing.  Goal:  Pt to consume >90% of meals/supplements; met.   Monitor:  Weights, labs, intake  Assessment:   Pt's intake has been really good during her hospitalization. Per family pt has been eating much better here than at home. Per family she might only eat one meal per day at home. Likely meal preparation and poor appetite are contributing factors to this. Pt has been drinking her Glucerna shakes here as she does at home. Adequate protein intake discussed during initial assessment. Encouraged pt to snack on high calorie high protein quick prep foods throughout the day if not eating 3 meals at home, to include yogurt, cheese/peanut butter crackers, pudding.  Pt had been concerned about eating and her blood sugars, we discussed importance of eating and treating blood sugars.   Height: Ht Readings from Last 1 Encounters:  05/18/12 5\' 4"  (1.626 m)    Weight Status:  No new weight Wt Readings from Last 1 Encounters:  05/18/12 99 lb 1.6 oz (44.951 kg)    Re-estimated needs:  Kcal: 1350-1600  Protein: 70-80g  Fluid: 1.3-1.6L/day  Skin: no issues noted  Diet Order: Cardiac Meal Completion: 100%    Intake/Output Summary (Last 24 hours) at 05/22/12 1110 Last data filed at 05/22/12 0600  Gross per 24 hour  Intake   1810 ml  Output   1875 ml  Net    -65 ml    Last BM: 1/24   Labs:   Lab 05/20/12 0650 05/18/12 0955 05/17/12 2033  NA 139 138 134*  K 3.7 3.3* 3.8  CL 109 103 98  CO2 25 25 26   BUN 13 13 20   CREATININE 0.53 0.60 0.62  CALCIUM 8.4 8.3* 8.7  MG -- -- --  PHOS -- -- --  GLUCOSE 77 200* 234*    CBG (last 3)   Basename 05/22/12 0746 05/21/12 2147 05/21/12  1641  GLUCAP 152* 170* 253*    Scheduled Meds:   . amLODipine  2.5 mg Oral q morning - 10a  . aztreonam  2 g Intravenous Q8H  . calcium carbonate  1 tablet Oral Daily  . feeding supplement  237 mL Oral BID BM  . filgrastim (NEUPOGEN)  SQ  300 mcg Subcutaneous q1800  . insulin aspart  0-15 Units Subcutaneous TID WC  . insulin aspart  0-5 Units Subcutaneous QHS  . insulin aspart  4 Units Subcutaneous TID WC  . levofloxacin (LEVAQUIN) IV  750 mg Intravenous Q48H  . metoprolol  50 mg Oral BID  . omega-3 acid ethyl esters  1 g Oral Daily  . pregabalin  75 mg Oral BID  . simvastatin  5 mg Oral QHS  . vancomycin  1,000 mg Intravenous Q24H    Continuous Infusions:   . 0.9 % NaCl with KCl 20 mEq / L 20 mL/hr (05/21/12 2202)    Kendell Bane RD, LDN, CNSC [phone] Pager [phone] After Hours Pager

## 2012-05-22 NOTE — Progress Notes (Addendum)
[facility] PROGRESS NOTE  Meredith Rodriguez:[id number] DOB: 1925-05-10 DOA: 05/17/2012 PCP: [name], MD  Brief narrative: 79 year-old female with a past medical history of AML/MDS, under the care of Dr. Welton Flakes, status post chemotherapy approximately 2 weeks prior to this admission, who presented to the hospital on 05/17/2012 with weakness, fever, and chills. Upon initial evaluation emergency department, the patient was found to be neutropenic and had evidence of pneumonia on chest radiography.   Assessment/Plan:   Principal Problem:  *Febrile neutropenia with healthcare associated pneumonia  Admitted and placed on empiric antibiotics including vancomycin, Levaquin, and aztreonam.  Blood cultures, influenza panel negative. Procalcitonin and lactic acid not elevated.  Continue Neupogen. White blood cell count slowly trending up, 1.7 today Active Problems:  Constipation  One bottle of magnesium citrate given 05/20/12. Patient had one bowel movement yesterday Hypokalemia  Resolved with potassium added to IV fluids. Blurred vision  MRI brain with no obvious metastasis DIABETES MELLITUS II, UNCOMPLICATED  CBGs relatively controlled. Continue sliding scale insulin moderate scale and meal coverage of 4 units of NovoLog. HYPERLIPIDEMIA  Continue statin and fish oil. HYPERTENSION, BENIGN SYSTEMIC  Blood pressure at goal. Continue Norvasc and metoprolol Normocytic Anemia  Likely anemia of chronic disease and the sequela of prior chemotherapy.  Status post 2 units of packed red blood cells 05/18/2012 and an additional 2 units of packed red blood cells 05/19/2012.  Continue to monitor hemoglobin transfuse for hemoglobin less than 8. 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 Welton Flakes. Thrombocytopenia  Likely the sequela of prior chemotherapy.  Status post one pack of platelets.  Continue to monitor platelet count Severe protein-calorie  malnutrition in the context of chronic illness / underweight  Dietitian consultation performed 05/18/2012.  Glucerna shakes ordered.   Code Status: Full  Family Communication: Family at bedside Disposition Plan: Home when stable.   Medical Consultants:  Dr. Drue Second, Oncology Other Consultants:  Dietitian Anti-infectives:  Levaquin 05/17/2012--->  Vancomycin 05/17/2012--->  Aztreonam 05/17/2012--->  Manson Passey, MD  [facility] Pager [phone]  If 7PM-7AM, please contact night-coverage [email or web address] Password [facility] 05/22/2012, 4:25 PM   LOS: 5 days   HPI/Subjective: No acute overnight events.  Objective: Filed Vitals:   05/21/12 1335 05/21/12 2152 05/22/12 0430 05/22/12 1413  BP: 118/70 178/74 170/74 124/68  Pulse: 96 97 87 84  Temp: 99.3 F (37.4 C) 99.3 F (37.4 C) 98.5 F (36.9 C) 98.1 F (36.7 C)  TempSrc: Oral Oral Oral Oral  Resp: 22 20 20 18   Height:      Weight:      SpO2: 98% 95% 98% 98%    Intake/Output Summary (Last 24 hours) at 05/22/12 1625 Last data filed at 05/22/12 0600  Gross per 24 hour  Intake   1810 ml  Output   1875 ml  Net    -65 ml    Exam:   General:  Pt is alert, follows commands appropriately, not in acute distress  Cardiovascular: Regular rate and rhythm, S1/S2, no murmurs, no rubs, no gallops  Respiratory: Clear to auscultation bilaterally, no wheezing, no crackles, no rhonchi  Abdomen: Soft, non tender, non distended, bowel sounds present, no guarding  Extremities: No edema, pulses DP and PT palpable bilaterally  Neuro: Grossly nonfocal  Data Reviewed: Basic Metabolic Panel:  Lab 05/20/12 8295 05/18/12 0955 05/17/12 2033  NA 139 138 134*  K 3.7 3.3* 3.8  CL 109 103 98  CO2 25 25 26   GLUCOSE  77 200* 234*  BUN 13 13 20   CREATININE 0.53 0.60 0.62  CALCIUM 8.4 8.3* 8.7  MG -- -- --  PHOS -- -- --   Liver Function Tests:  Lab 05/18/12 0955 05/17/12 2033  AST 16 21  ALT 19 26  ALKPHOS 42 50  BILITOT 1.1 0.9    PROT 5.2* 5.9*  ALBUMIN 2.6* 3.2*   CBC:  Lab 05/22/12 0414 05/21/12 0530 05/20/12 0650 05/19/12 0355 05/18/12 0955 05/17/12 2033  WBC 1.7* 1.2* 0.7* 0.7* 0.4* --  NEUTROABS 0.7* 0.2* -- -- -- 0.0*  HGB 11.0* 10.7* 11.0* 7.9* 9.1* --  HCT 31.8* 31.0* 32.2* 22.6* 25.9* --  MCV 87.8 87.6 87.5 85.9 85.5 --  PLT 15* 21* 25* 46* <5* --   CBG:  Lab 05/22/12 1135 05/22/12 0746 05/21/12 2147 05/21/12 1641 05/21/12 1151  GLUCAP 148* 152* 170* 253* 201*    URINE CULTURE     Status: Normal   Collection Time   05/17/12  7:16 PM      Component Value Range Status Comment   Specimen Description URINE, RANDOM   Final    Culture INSIGNIFICANT GROWTH   Final    Report Status 05/19/2012 FINAL   Final   CULTURE, BLOOD (ROUTINE X 2)     Status: Normal (Preliminary result)   Collection Time   05/17/12  8:33 PM      Component Value Range Status Comment   Culture     Final    Value:        BLOOD CULTURE RECEIVED NO GROWTH TO DATE    Report Status PENDING   Incomplete   CULTURE, BLOOD (ROUTINE X 2)     Status: Normal (Preliminary result)   Collection Time   05/17/12  8:45 PM      Component Value Range Status Comment   Culture  Setup Time 05/18/2012 14:10   Final    Culture     Final    Value:        BLOOD CULTURE RECEIVED NO GROWTH TO DATE    Report Status PENDING   Incomplete      Studies: No results found.  Scheduled Meds:   . amLODipine  2.5 mg Oral q morning - 10a  . aztreonam  2 g Intravenous Q8H  . calcium carbonate  1 tablet Oral Daily  . filgrastim (NEUPOGEN)   300 mcg Subcutaneous q1800  . insulin aspart  0-15 Units Subcutaneous TID WC  . insulin aspart  0-5 Units Subcutaneous QHS  . insulin aspart  4 Units Subcutaneous TID WC  . levofloxacin (LEVAQUIN)  750 mg Intravenous Q48H  . metoprolol  50 mg Oral BID  . omega-3 acid ethyl   1 g Oral Daily  . pregabalin  75 mg Oral BID  . simvastatin  5 mg Oral QHS  . vancomycin  1,000 mg Intravenous Q24H   Continuous Infusions:    . 0.9 % NaCl with KCl 20 mEq / L 20 mL/hr (05/21/12 2202)

## 2012-05-22 NOTE — Progress Notes (Signed)
MD, Patient has a blister on the inner side of her right cheek that is irritating to her.  Patient also has a few ulcerations lining the inside of her mouth.  May she have magic mouthwash or something similar to help relieve this discomfort?Meredith Rodriguez

## 2012-05-22 NOTE — Progress Notes (Signed)
ANTIBIOTIC CONSULT NOTE - Follow Up  Pharmacy Consult for Azactam/Levaquin/Vancomycin Indication: Sepsis from possible HCAP/Febrile Neutropenia   Allergies  Allergen Reactions  . Amoxicillin (Amoxicillin)     Upset stomach  . Cephalexin Nausea Only  . Metformin And Related     Gi problems  . Phenergan (Promethazine Hcl) Other (See Comments)    Unknown   . Procardia (Nifedipine)     dizziness    Patient Measurements: Height: 5\' 4"  (162.6 cm) Weight: 99 lb 1.6 oz (44.951 kg) IBW/kg (Calculated) : 54.7    Vital Signs: Temp: 99.3 F (37.4 C) (01/23 2152) Temp src: Oral (01/23 2152) BP: 178/74 mmHg (01/23 2152) Pulse Rate: 97  (01/23 2152) Intake/Output from previous day: 01/23 0701 - 01/24 0700 In: 480 [P.O.:480] Out: 825 [Urine:825] Intake/Output from this shift: Total I/O In: -  Out: 825 [Urine:825]  Labs:  [facility] 05/21/12 0530 05/20/12 0650 05/19/12 0355  WBC 1.2* 0.7* 0.7*  HGB 10.7* 11.0* 7.9*  PLT 21* 25* 46*  LABCREA -- -- --  CREATININE -- 0.53 --   Estimated Creatinine Clearance: 37.2 ml/min (by C-G formula based on Cr of 0.53).  Basename 05/22/12 0030  VANCOTROUGH <5.0*  VANCOPEAK --  Drue Dun --  GENTTROUGH --  GENTPEAK --  GENTRANDOM --  TOBRATROUGH --  TOBRAPEAK --  TOBRARND --  AMIKACINPEAK --  AMIKACINTROU --  AMIKACIN --     Microbiology: Recent Results (from the past 720 hour(s))  URINE CULTURE     Status: Normal   Collection Time   05/17/12  7:16 PM      Component Value Range Status Comment   Specimen Description URINE, RANDOM   Final    Special Requests NONE   Final    Culture  Setup Time 05/18/2012 11:46   Final    Colony Count 9,000 COLONIES/ML   Final    Culture INSIGNIFICANT GROWTH   Final    Report Status 05/19/2012 FINAL   Final   CULTURE, BLOOD (ROUTINE X 2)     Status: Normal (Preliminary result)   Collection Time   05/17/12  8:33 PM      Component Value Range Status Comment   Specimen Description BLOOD LEFT  ARM   Final    Special Requests BOTTLES DRAWN AEROBIC AND ANAEROBIC   Final    Culture  Setup Time 05/18/2012 14:10   Final    Culture     Final    Value:        BLOOD CULTURE RECEIVED NO GROWTH TO DATE CULTURE WILL BE HELD FOR 5 DAYS BEFORE ISSUING A FINAL NEGATIVE REPORT   Report Status PENDING   Incomplete   CULTURE, BLOOD (ROUTINE X 2)     Status: Normal (Preliminary result)   Collection Time   05/17/12  8:45 PM      Component Value Range Status Comment   Specimen Description BLOOD LEFT ARM   Final    Special Requests BOTTLES DRAWN AEROBIC AND ANAEROBIC   Final    Culture  Setup Time 05/18/2012 14:10   Final    Culture     Final    Value:        BLOOD CULTURE RECEIVED NO GROWTH TO DATE CULTURE WILL BE HELD FOR 5 DAYS BEFORE ISSUING A FINAL NEGATIVE REPORT   Report Status PENDING   Incomplete     Medical History: Past Medical History  Diagnosis Date  . Hypertension   . Diabetes mellitus   . Hyperkalemia   .  IBS (irritable bowel syndrome)   . Hypercholesterolemia   . Cervical radiculopathy   . Anemia   . Cancer     leukemia    Medications:  Scheduled:     . amLODipine  2.5 mg Oral q morning - 10a  . aztreonam  2 g Intravenous Q8H  . calcium carbonate  1 tablet Oral Daily  . feeding supplement  237 mL Oral BID BM  . filgrastim (NEUPOGEN)  SQ  300 mcg Subcutaneous q1800  . insulin aspart  0-15 Units Subcutaneous TID WC  . insulin aspart  0-5 Units Subcutaneous QHS  . insulin aspart  4 Units Subcutaneous TID WC  . levofloxacin (LEVAQUIN) IV  750 mg Intravenous Q48H  . metoprolol  50 mg Oral BID  . omega-3 acid ethyl esters  1 g Oral Daily  . pregabalin  75 mg Oral BID  . simvastatin  5 mg Oral QHS  . vancomycin  750 mg Intravenous Q24H  . [DISCONTINUED] insulin glargine  10 Units Subcutaneous QHS   Infusions:     . 0.9 % NaCl with KCl 20 mEq / L 20 mL/hr (05/21/12 2202)   Assessment:  79 yo with history of AML/MDS on chemotherapy (last 1/10) admitted  with febrile neutopenia, x-ray suggestive of PNA.   D# 5 Vanc 750mg  IV q24h/Aztreonam 2g IV q8h/Levaquin 750mg  IV q48h  Estimated CrCl of 37, Scr stable  WBC 1.2, ANC 0.2. Afebrile  Vancomycin trough level < 5 mcg/ml on current regimen.  Vancomycin 750mg  dose given @ 02:00.  Due to age and renal function, will continue to keep patient on a q24hr regimen however will increase dose to 1000mg  with next dose.   Goal of Therapy:  Vancomycin trough level 15-20 mcg/ml  Plan:  Increase Vancomycin to 1000mg  IV q24h (will begin new regimen @ 20:00 today) Recheck vancomycin trough level when at new steady state Continue current doses of levaquin and aztreonam   Terrilee Files, PharmD 05/22/2012 2:28 AM

## 2012-05-23 DIAGNOSIS — K5900 Constipation, unspecified: Secondary | ICD-10-CM

## 2012-05-23 DIAGNOSIS — D649 Anemia, unspecified: Secondary | ICD-10-CM

## 2012-05-23 LAB — CBC WITH DIFFERENTIAL/PLATELET
Eosinophils Absolute: 0 10*3/uL (ref 0.0–0.7)
Eosinophils Relative: 0 % (ref 0–5)
Lymphs Abs: 1 10*3/uL (ref 0.7–4.0)
MCH: 30.1 pg (ref 26.0–34.0)
MCHC: 33.6 g/dL (ref 30.0–36.0)
MCV: 89.6 fL (ref 78.0–100.0)
Monocytes Absolute: 0.9 10*3/uL (ref 0.1–1.0)
Platelets: 11 10*3/uL — CL (ref 150–400)
RBC: 3.36 MIL/uL — ABNORMAL LOW (ref 3.87–5.11)

## 2012-05-23 LAB — BASIC METABOLIC PANEL
CO2: 30 mEq/L (ref 19–32)
Calcium: 8.5 mg/dL (ref 8.4–10.5)
Creatinine, Ser: 0.59 mg/dL (ref 0.50–1.10)
GFR calc non Af Amer: 82 mL/min — ABNORMAL LOW (ref >90)
Glucose, Bld: 174 mg/dL — ABNORMAL HIGH (ref 70–99)

## 2012-05-23 LAB — TYPE AND SCREEN: Antibody Screen: NEGATIVE

## 2012-05-23 LAB — GLUCOSE, CAPILLARY

## 2012-05-23 MED ORDER — FLUCONAZOLE 100 MG PO TABS
100.0000 mg | ORAL_TABLET | Freq: Every day | ORAL | Status: DC
  Administered 2012-05-23 – 2012-05-26 (×4): 100 mg via ORAL
  Filled 2012-05-23 (×4): qty 1

## 2012-05-23 NOTE — Progress Notes (Signed)
[facility] PROGRESS NOTE  Meredith Rodriguez NWG:[id number] DOB: [birth year] DOA: 05/17/2012 PCP: [name], MD  Brief narrative: 79 year-old female with a past medical history of AML/MDS, under the care of Dr. Welton Flakes, status post chemotherapy approximately 2 weeks prior to this admission, who presented to the hospital on 05/17/2012 with weakness, fever, and chills. Upon initial evaluation emergency department, the patient was found to be neutropenic and had evidence of pneumonia on chest radiography.   Assessment/Plan:   Principal Problem:  *Febrile neutropenia with healthcare associated pneumonia  Admitted and placed on empiric antibiotics including vancomycin, Levaquin, and aztreonam.  Blood cultures, influenza panel negative. Procalcitonin and lactic acid not elevated.  Continue Neupogen daily.  White blood cell count slowly trending up, 2.5 today  Active Problems:  Constipation  One bottle of magnesium citrate given 05/20/12.  Patient had one bowel movement yesterday as well Hypokalemia  Resolved with potassium added to IV fluids. Blurred vision  MRI brain with no obvious metastasis DIABETES MELLITUS II, UNCOMPLICATED  CBGs  controlled. Continue sliding scale insulin moderate scale and meal coverage of 4 units of NovoLog. HYPERLIPIDEMIA  Continue statin and fish oil. HYPERTENSION, BENIGN SYSTEMIC  Blood pressure at goal.  Continue Norvasc and metoprolol Normocytic Anemia  Likely anemia of chronic disease and the sequela of prior chemotherapy.  Status post 2 units of packed red blood cells 05/18/2012 and an additional 2 units of packed red blood cells 05/19/2012.  Continue to monitor hemoglobin transfuse for hemoglobin less than 8.  Hemoglobin today 10.1 Acute myeloid leukemia, without mention of having achieved remission  Being followed by Dr. Welton Flakes. Thrombocytopenia  Likely the sequela of prior chemotherapy.  Status post one pack of platelets since admission.  Platelet  count 11 today so we will transfuse 2 units of platelets today Follow up CBC in am Severe protein-calorie malnutrition in the context of chronic illness / underweight  Dietitian consultation performed 05/18/2012.  Glucerna shakes ordered.   Code Status: Full  Family Communication: Family not at bedside today Disposition Plan: Home when stable.    Medical Consultants:  Dr. Drue Second, Oncology Other Consultants:  Dietitian Anti-infectives:  Levaquin 05/17/2012--->  Vancomycin 05/17/2012--->  Aztreonam 05/17/2012--->  Manson Passey, MD  [facility]  Pager [phone]   If 7PM-7AM, please contact night-coverage [email or web address] Password [facility] 05/23/2012, 11:20 AM   LOS: 6 days   HPI/Subjective: No acute overnight events.  Objective: Filed Vitals:   05/22/12 0430 05/22/12 1413 05/22/12 2115 05/23/12 0714  BP: 170/74 124/68 171/59 164/78  Pulse: 87 84 99 94  Temp: 98.5 F (36.9 C) 98.1 F (36.7 C) 98.4 F (36.9 C) 98.4 F (36.9 C)  TempSrc: Oral Oral Oral Oral  Resp: 20 18 20 20   Height:      Weight:      SpO2: 98% 98% 98% 97%    Intake/Output Summary (Last 24 hours) at 05/23/12 1120 Last data filed at 05/23/12 0934  Gross per 24 hour  Intake    570 ml  Output    175 ml  Net    395 ml    Exam:   General:  Pt is alert, follows commands appropriately, not in acute distress  Cardiovascular: Regular rate and rhythm, S1/S2, no murmurs, no rubs, no gallops  Respiratory: Clear to auscultation bilaterally, no wheezing, no crackles, no rhonchi  Abdomen: Soft, non tender, non distended, bowel sounds present, no guarding  Extremities: No edema, pulses DP and PT palpable bilaterally  Neuro: Grossly nonfocal  Data Reviewed:  Basic Metabolic Panel:  Lab 05/23/12 1610 05/20/12 0650 05/18/12 0955 05/17/12 2033  NA 139 139 138 134*  K 3.9 3.7 3.3* 3.8  CL 105 109 103 98  CO2 30 25 25 26   GLUCOSE 174* 77 200* 234*  BUN 18 13 13 20   CREATININE 0.59 0.53 0.60 0.62  CALCIUM  8.5 8.4 8.3* 8.7  MG -- -- -- --  PHOS -- -- -- --   Liver Function Tests:  Lab 05/18/12 0955 05/17/12 2033  AST 16 21  ALT 19 26  ALKPHOS 42 50  BILITOT 1.1 0.9  PROT 5.2* 5.9*  ALBUMIN 2.6* 3.2*   CBC:  Lab 05/23/12 0600 05/22/12 0414 05/21/12 0530 05/20/12 0650 05/19/12 0355 05/17/12 2033  WBC 2.5* 1.7* 1.2* 0.7* 0.7* --  NEUTROABS 0.5* 0.7* 0.2* -- -- 0.0*  HGB 10.1* 11.0* 10.7* 11.0* 7.9* --  HCT 30.1* 31.8* 31.0* 32.2* 22.6* --  MCV 89.6 87.8 87.6 87.5 85.9 --  PLT 11* 15* 21* 25* 46* --   CBG:  Lab 05/23/12 0746 05/22/12 2110 05/22/12 1643 05/22/12 1135 05/22/12 0746  GLUCAP 143* 204* 185* 148* 152*    URINE CULTURE     Status: Normal   Collection Time   05/17/12  7:16 PM      Component Value Range Status Comment   Specimen Description URINE, RANDOM   Final    Culture INSIGNIFICANT GROWTH   Final    Report Status 05/19/2012 FINAL   Final   CULTURE, BLOOD (ROUTINE X 2)     Status: Normal (Preliminary result)   Collection Time   05/17/12  8:33 PM      Component Value Range Status Comment   Culture     Final    Value:        BLOOD CULTURE RECEIVED NO GROWTH TO DATE    Report Status PENDING   Incomplete   CULTURE, BLOOD (ROUTINE X 2)     Status: Normal (Preliminary result)   Collection Time   05/17/12  8:45 PM      Component Value Range Status Comment   Culture     Final    Value:        BLOOD CULTURE RECEIVED NO GROWTH TO DATE    Report Status PENDING   Incomplete      Studies: No results found.  Scheduled Meds:   . amLODipine  2.5 mg Oral q morning - 10a  . aztreonam  2 g Intravenous Q8H  . calcium carbonate  1 tablet Oral Daily  . feeding supplement  237 mL Oral BID BM  . filgrastim (NEUPOGEN)  SQ  300 mcg Subcutaneous q1800  . insulin aspart  0-15 Units Subcutaneous TID WC  . insulin aspart  0-5 Units Subcutaneous QHS  . insulin aspart  4 Units Subcutaneous TID WC  . levofloxacin (LEVAQUIN) IV  750 mg Intravenous Q48H  . metoprolol  50 mg Oral  BID  . omega-3 acid ethyl esters  1 g Oral Daily  . pregabalin  75 mg Oral BID  . simvastatin  5 mg Oral QHS  . vancomycin  1,000 mg Intravenous Q24H   Continuous Infusions:   . 0.9 % NaCl with KCl 20 mEq / L 20 mL/hr at 05/22/12 1500

## 2012-05-23 NOTE — Progress Notes (Signed)
Pt's platelets remain low at 11. Will cont to monitor pt.

## 2012-05-24 DIAGNOSIS — J189 Pneumonia, unspecified organism: Principal | ICD-10-CM

## 2012-05-24 LAB — BASIC METABOLIC PANEL
CO2: 31 mEq/L (ref 19–32)
Calcium: 8.9 mg/dL (ref 8.4–10.5)
Chloride: 99 mEq/L (ref 96–112)
Creatinine, Ser: 0.53 mg/dL (ref 0.50–1.10)
Glucose, Bld: 188 mg/dL — ABNORMAL HIGH (ref 70–99)

## 2012-05-24 LAB — CBC WITH DIFFERENTIAL/PLATELET
Basophils Relative: 2 % — ABNORMAL HIGH (ref 0–1)
Eosinophils Absolute: 0 10*3/uL (ref 0.0–0.7)
Eosinophils Relative: 0 % (ref 0–5)
HCT: 29.6 % — ABNORMAL LOW (ref 36.0–46.0)
Hemoglobin: 10 g/dL — ABNORMAL LOW (ref 12.0–15.0)
MCH: 30.3 pg (ref 26.0–34.0)
MCHC: 33.8 g/dL (ref 30.0–36.0)
MCV: 89.7 fL (ref 78.0–100.0)
Monocytes Absolute: 0 10*3/uL — ABNORMAL LOW (ref 0.1–1.0)
Neutro Abs: 3.3 10*3/uL (ref 1.7–7.7)

## 2012-05-24 LAB — CULTURE, BLOOD (ROUTINE X 2): Culture: NO GROWTH

## 2012-05-24 NOTE — Progress Notes (Addendum)
[facility] PROGRESS NOTE  Meredith Rodriguez ZOX:[id number] DOB: March 10, 1926 DOA: 05/17/2012 PCP: [name], MD  Brief narrative: 79 year-old female with a past medical history of AML/MDS, under the care of Dr. Welton Flakes, status post chemotherapy approximately 2 weeks prior to this admission, who presented to the hospital on 05/17/2012 with weakness, fever, and chills. Upon initial evaluation emergency department, the patient was found to be neutropenic and had evidence of pneumonia on chest radiography.  Since admission, patient has received total of 2 units of platelets and 4 units of PRBC's..  Assessment/Plan:   Principal Problem:  *Febrile neutropenia with healthcare associated pneumonia  Admitted and placed on empiric antibiotics including vancomycin, Levaquin, and aztreonam.  Blood cultures, influenza panel negative. Procalcitonin and lactic acid not elevated.  WBC wnl today so will d/c Neupogen today 05/24/2012  Active Problems:  Constipation  One bottle of magnesium citrate given 05/20/12.  Has daily BM Hypokalemia  Resolved with potassium added to IV fluids. Blurred vision  MRI brain with no obvious metastasis DIABETES MELLITUS II, UNCOMPLICATED  CBGs controlled. Continue sliding scale insulin moderate scale and meal coverage of 4 units of NovoLog. HYPERLIPIDEMIA  Continue statin, low dose and fish oil. HYPERTENSION, BENIGN SYSTEMIC  Blood pressure at goal.  Continue Norvasc and metoprolol Normocytic Anemia  Likely anemia of chronic disease and the sequela of prior chemotherapy.  Status post 2 units of packed red blood cells 05/18/2012 and an additional 2 units of packed red blood cells 05/19/2012.  Continue to monitor hemoglobin transfuse for hemoglobin less than 8.  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 Welton Flakes. Thrombocytopenia  Likely the sequela of prior chemotherapy.  Status post 2 units of platelets  since admission.  Platelet count today 49 Severe protein-calorie malnutrition in the context of chronic illness / underweight  Dietitian consultation performed 05/18/2012.  Glucerna shakes ordered.   Code Status: Full  Family Communication: Family not at bedside today  Disposition Plan: Home when stable.    Medical Consultants:  Dr. Drue Second, Oncology Other Consultants:  Dietitian Anti-infectives:  Levaquin 05/17/2012--->  Vancomycin 05/17/2012--->  Aztreonam 05/17/2012--->   Manson Passey, MD  [facility]  Pager [phone]   If 7PM-7AM, please contact night-coverage [email or web address] Password [email or web address] 05/24/2012, 10:08 AM   LOS: 7 days   HPI/Subjective: No acute overnight events.  Objective: Filed Vitals:   05/23/12 1500 05/23/12 1525 05/23/12 2041 05/24/12 0535  BP: 153/50 152/55 172/58 170/70  Pulse: 91 88 97 91  Temp: 98.2 F (36.8 C) 98.4 F (36.9 C) 98.9 F (37.2 C) 98.5 F (36.9 C)  TempSrc: Oral Oral Oral Oral  Resp: 18 20 20 20   Height:      Weight:      SpO2:   96% 97%    Intake/Output Summary (Last 24 hours) at 05/24/12 1008 Last data filed at 05/24/12 0700  Gross per 24 hour  Intake   1600 ml  Output   1325 ml  Net    275 ml    Exam:   General:  Pt is alert, follows commands appropriately, not in acute distress  Cardiovascular: Regular rate and rhythm, S1/S2, no murmurs, no rubs, no gallops  Respiratory: Clear to auscultation bilaterally, no wheezing, no crackles, no rhonchi  Abdomen: Soft, non tender, non distended, bowel sounds present, no guarding  Extremities: No edema, pulses DP and PT palpable bilaterally  Neuro: Grossly nonfocal  Data Reviewed: Basic Metabolic Panel:  Lab 05/24/12 8295  05/23/12 0600 05/20/12 0650 05/18/12 0955 05/17/12 2033  NA 137 139 139 138 134*  K 3.9 3.9 3.7 3.3* 3.8  CL 99 105 109 103 98  CO2 31 30 25 25 26   GLUCOSE 188* 174* 77 200* 234*  BUN 14 18 13 13 20   CREATININE 0.53 0.59 0.53 0.60 0.62  CALCIUM 8.9  8.5 8.4 8.3* 8.7  MG -- -- -- -- --  PHOS -- -- -- -- --   Liver Function Tests:  Lab 05/18/12 0955 05/17/12 2033  AST 16 21  ALT 19 26  ALKPHOS 42 50  BILITOT 1.1 0.9  PROT 5.2* 5.9*  ALBUMIN 2.6* 3.2*   CBC:  Lab 05/24/12 0610 05/23/12 0600 05/22/12 0414 05/21/12 0530 05/20/12 0650 05/17/12 2033  WBC 4.3 2.5* 1.7* 1.2* 0.7* --  NEUTROABS 3.3 0.5* 0.7* 0.2* -- 0.0*  HGB 10.0* 10.1* 11.0* 10.7* 11.0* --  HCT 29.6* 30.1* 31.8* 31.0* 32.2* --  MCV 89.7 89.6 87.8 87.6 87.5 --  PLT 49* 11* 15* 21* 25* --   Cardiac Enzymes: No results found for this basename: CKTOTAL:5,CKMB:5,CKMBINDEX:5,TROPONINI:5 in the last 168 hours BNP: No components found with this basename: POCBNP:5 CBG:  Lab 05/24/12 0727 05/23/12 2203 05/23/12 1732 05/23/12 1208 05/23/12 0746  GLUCAP 157* 230* 185* 316* 143*    Recent Results (from the past 240 hour(s))  URINE CULTURE     Status: Normal   Collection Time   05/17/12  7:16 PM      Component Value Range Status Comment   Specimen Description URINE, RANDOM   Final    Special Requests NONE   Final    Culture  Setup Time 05/18/2012 11:46   Final    Colony Count 9,000 COLONIES/ML   Final    Culture INSIGNIFICANT GROWTH   Final    Report Status 05/19/2012 FINAL   Final   CULTURE, BLOOD (ROUTINE X 2)     Status: Normal (Preliminary result)   Collection Time   05/17/12  8:33 PM      Component Value Range Status Comment   Specimen Description BLOOD LEFT ARM   Final    Special Requests BOTTLES DRAWN AEROBIC AND ANAEROBIC   Final    Culture  Setup Time 05/18/2012 14:10   Final    Culture     Final    Value:        BLOOD CULTURE RECEIVED NO GROWTH TO DATE CULTURE WILL BE HELD FOR 5 DAYS BEFORE ISSUING A FINAL NEGATIVE REPORT   Report Status PENDING   Incomplete   CULTURE, BLOOD (ROUTINE X 2)     Status: Normal (Preliminary result)   Collection Time   05/17/12  8:45 PM      Component Value Range Status Comment   Specimen Description BLOOD LEFT ARM    Final    Special Requests BOTTLES DRAWN AEROBIC AND ANAEROBIC   Final    Culture  Setup Time 05/18/2012 14:10   Final    Culture     Final    Value:        BLOOD CULTURE RECEIVED NO GROWTH TO DATE CULTURE WILL BE HELD FOR 5 DAYS BEFORE ISSUING A FINAL NEGATIVE REPORT   Report Status PENDING   Incomplete      Studies: No results found.  Scheduled Meds:   . amLODipine  2.5 mg Oral q morning - 10a  . aztreonam  2 g Intravenous Q8H  . calcium carbonate  1 tablet Oral  Daily  . feeding supplement  237 mL Oral BID BM  . fluconazole  100 mg Oral Daily  . insulin aspart  0-15 Units Subcutaneous TID WC  . insulin aspart  0-5 Units Subcutaneous QHS  . insulin aspart  4 Units Subcutaneous TID WC  . levofloxacin (LEVAQUIN) IV  750 mg Intravenous Q48H  . metoprolol  50 mg Oral BID  . omega-3 acid ethyl esters  1 g Oral Daily  . pregabalin  75 mg Oral BID  . simvastatin  5 mg Oral QHS  . vancomycin  1,000 mg Intravenous Q24H   Continuous Infusions:   . 0.9 % NaCl with KCl 20 mEq / L 20 mL/hr at 05/22/12 1500

## 2012-05-25 ENCOUNTER — Other Ambulatory Visit: Payer: Medicare Other | Admitting: Lab

## 2012-05-25 ENCOUNTER — Ambulatory Visit: Payer: Medicare Other | Admitting: Adult Health

## 2012-05-25 LAB — CBC WITH DIFFERENTIAL/PLATELET
Basophils Relative: 2 % — ABNORMAL HIGH (ref 0–1)
Eosinophils Relative: 0 % (ref 0–5)
Hemoglobin: 9.2 g/dL — ABNORMAL LOW (ref 12.0–15.0)
MCH: 30.4 pg (ref 26.0–34.0)
MCV: 90.8 fL (ref 78.0–100.0)
Monocytes Absolute: 0 10*3/uL — ABNORMAL LOW (ref 0.1–1.0)
Monocytes Relative: 0 % — ABNORMAL LOW (ref 3–12)
Neutrophils Relative %: 73 % (ref 43–77)
Platelets: 76 10*3/uL — ABNORMAL LOW (ref 150–400)
RBC: 3.03 MIL/uL — ABNORMAL LOW (ref 3.87–5.11)
WBC: 5 10*3/uL (ref 4.0–10.5)

## 2012-05-25 LAB — BASIC METABOLIC PANEL
CO2: 33 mEq/L — ABNORMAL HIGH (ref 19–32)
Calcium: 9 mg/dL (ref 8.4–10.5)
Chloride: 100 mEq/L (ref 96–112)
Glucose, Bld: 206 mg/dL — ABNORMAL HIGH (ref 70–99)
Sodium: 138 mEq/L (ref 135–145)

## 2012-05-25 LAB — PREPARE PLATELET PHERESIS: Unit division: 0

## 2012-05-25 MED ORDER — METOPROLOL TARTRATE 50 MG PO TABS
75.0000 mg | ORAL_TABLET | Freq: Two times a day (BID) | ORAL | Status: DC
  Administered 2012-05-25 – 2012-05-26 (×2): 75 mg via ORAL
  Filled 2012-05-25 (×3): qty 1

## 2012-05-25 NOTE — Progress Notes (Signed)
ANTIBIOTIC CONSULT NOTE - Follow Up  Pharmacy Consult for Azactam/Levaquin/Vancomycin Indication: Sepsis from possible HCAP/Febrile Neutropenia   Allergies  Allergen Reactions  . Amoxicillin (Amoxicillin)     Upset stomach  . Cephalexin Nausea Only  . Metformin And Related     Gi problems  . Phenergan (Promethazine Hcl) Other (See Comments)    Unknown   . Procardia (Nifedipine)     dizziness    Patient Measurements: Height: 5\' 4"  (162.6 cm) Weight: 99 lb 1.6 oz (44.951 kg) IBW/kg (Calculated) : 54.7    Vital Signs: Temp: 98.9 F (37.2 C) (01/27 0549) Temp src: Oral (01/27 0549) BP: 153/48 mmHg (01/27 0549) Pulse Rate: 87  (01/27 0549) Intake/Output from previous day: 01/26 0701 - 01/27 0700 In: 608.5 [I.V.:596; Blood:12.5] Out: 600 [Urine:600] Intake/Output from this shift: Total I/O In: -  Out: 350 [Urine:350]  Labs:  [facility] 05/25/12 0550 05/24/12 0610 05/23/12 0600  WBC 5.0 4.3 2.5*  HGB 9.2* 10.0* 10.1*  PLT 76* 49* 11*  LABCREA -- -- --  CREATININE 0.54 0.53 0.59   Estimated Creatinine Clearance: 37.2 ml/min (by C-G formula based on Cr of 0.54). No results found for this basename: VANCOTROUGH:2,VANCOPEAK:2,VANCORANDOM:2,GENTTROUGH:2,GENTPEAK:2,GENTRANDOM:2,TOBRATROUGH:2,TOBRAPEAK:2,TOBRARND:2,AMIKACINPEAK:2,AMIKACINTROU:2,AMIKACIN:2, in the last 72 hours   Microbiology: Recent Results (from the past 720 hour(s))  URINE CULTURE     Status: Normal   Collection Time   05/17/12  7:16 PM      Component Value Range Status Comment   Specimen Description URINE, RANDOM   Final    Special Requests NONE   Final    Culture  Setup Time 05/18/2012 11:46   Final    Colony Count 9,000 COLONIES/ML   Final    Culture INSIGNIFICANT GROWTH   Final    Report Status 05/19/2012 FINAL   Final   CULTURE, BLOOD (ROUTINE X 2)     Status: Normal   Collection Time   05/17/12  8:33 PM      Component Value Range Status Comment   Specimen Description BLOOD LEFT ARM   Final    Special Requests BOTTLES DRAWN AEROBIC AND ANAEROBIC   Final    Culture  Setup Time 05/18/2012 14:10   Final    Culture NO GROWTH 5 DAYS   Final    Report Status 05/24/2012 FINAL   Final   CULTURE, BLOOD (ROUTINE X 2)     Status: Normal   Collection Time   05/17/12  8:45 PM      Component Value Range Status Comment   Specimen Description BLOOD LEFT ARM   Final    Special Requests BOTTLES DRAWN AEROBIC AND ANAEROBIC   Final    Culture  Setup Time 05/18/2012 14:10   Final    Culture NO GROWTH 5 DAYS   Final    Report Status 05/24/2012 FINAL   Final     Medical History: Past Medical History  Diagnosis Date  . Hypertension   . Diabetes mellitus   . Hyperkalemia   . IBS (irritable bowel syndrome)   . Hypercholesterolemia   . Cervical radiculopathy   . Anemia   . Cancer     leukemia    Medications:  Scheduled:     . amLODipine  2.5 mg Oral q morning - 10a  . aztreonam  2 g Intravenous Q8H  . calcium carbonate  1 tablet Oral Daily  . feeding supplement  237 mL Oral BID BM  . fluconazole  100 mg Oral Daily  . insulin aspart  0-15 Units Subcutaneous TID WC  . insulin aspart  0-5 Units Subcutaneous QHS  . insulin aspart  4 Units Subcutaneous TID WC  . levofloxacin (LEVAQUIN) IV  750 mg Intravenous Q48H  . metoprolol  75 mg Oral BID  . omega-3 acid ethyl esters  1 g Oral Daily  . pregabalin  75 mg Oral BID  . simvastatin  5 mg Oral QHS  . vancomycin  1,000 mg Intravenous Q24H  . [DISCONTINUED] metoprolol  50 mg Oral BID   Infusions:     . 0.9 % NaCl with KCl 20 mEq / L 20 mL/hr at 05/22/12 1500   Assessment:  79 yo with history of AML/MDS on chemotherapy (last 1/10) admitted with febrile neutopenia, x-ray suggestive of PNA.   D# 9 Levaquin 750mg  IV q48h, Day #8  Vanc 750mg  IV q24h, Day #8 Aztreonam 2g IV q8h  Estimated CrCl of 37, Scr stable  Neutropenia resolved, ANC 3.6 (WBC 5). Afebrile > 72h  Vancomycin trough level < 5 mcg/ml on 750mg  IV q12h 1/23 so  dose increased to 1000 mg q12h.   Goal of Therapy:  Vancomycin trough level 15-20 mcg/ml  Plan:  Continue current doses of abx Follow renal fx and adjust doses as appropriate Duration of therapy per MD  Gwen Her PharmD  [phone] 05/25/2012 12:25 PM

## 2012-05-25 NOTE — Progress Notes (Signed)
[facility] PROGRESS NOTE  Meredith Rodriguez ZHY:[id number] DOB: [birth year] DOA: 05/17/2012 PCP: [name], MD  Brief narrative: 79 year-old female with a past medical history of AML/MDS, under the care of Dr. Welton Flakes, status post chemotherapy approximately 2 weeks prior to this admission, who presented to the hospital on 05/17/2012 with weakness, fever, and chills. Upon initial evaluation emergency department, the patient was found to be neutropenic and had evidence of pneumonia on chest radiography.  Since admission, patient has received total of 2 units of platelets and 4 units of PRBC's. White blood cell count has normalized and platelet count is trending up.  Assessment/Plan:   Principal Problem:  *Febrile neutropenia with healthcare associated pneumonia  Admitted and placed on empiric antibiotics including vancomycin, Levaquin, and aztreonam.  Blood cultures, influenza panel negative. Procalcitonin and lactic acid not elevated.  WBC within normal limit and Neupogen was discontinued 05/24/2012  Active Problems:  Constipation  One bottle of magnesium citrate given 05/20/12.  Has daily BM Hypokalemia  Resolved with potassium added to IV fluids. Blurred vision  MRI brain with no obvious metastasis DIABETES MELLITUS II, UNCOMPLICATED  CBGs controlled. Continue sliding scale insulin moderate scale and meal coverage of 4 units of NovoLog. HYPERLIPIDEMIA  Continue statin, low dose and fish oil. HYPERTENSION, BENIGN SYSTEMIC  Continue Norvasc and metoprolol (increased metoprolol to home dosage 75 mg twice daily) Normocytic Anemia  Likely anemia of chronic disease and the sequela of prior chemotherapy.  Status post 2 units of packed red blood cells 05/18/2012 and an additional 2 units of packed red blood cells 05/19/2012.  Continue to monitor hemoglobin transfuse for hemoglobin less than 8.  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 Welton Flakes. Thrombocytopenia  Likely the sequela of prior chemotherapy.  Status post 2 units of platelets since admission.  Platelet count today 76 Severe protein-calorie malnutrition in the context of chronic illness / underweight  Dietitian consultation performed 05/18/2012.  Glucerna shakes ordered.   Code Status: Full  Family Communication: Family not at bedside today  Disposition Plan: Home when stable.    Medical Consultants:  Dr. Drue Second, Oncology Other Consultants:  Dietitian Anti-infectives:  Levaquin 05/17/2012--->  Vancomycin 05/17/2012--->  Aztreonam 05/17/2012--->   Manson Passey, MD  [facility]  Pager [phone]   If 7PM-7AM, please contact night-coverage [email or web address] Password [email or web address] 05/25/2012, 11:12 AM   LOS: 8 days   HPI/Subjective: No acute overnight events. Patient sitting in chair eating breakfast.  Objective: Filed Vitals:   05/24/12 1915 05/24/12 2118 05/24/12 2300 05/25/12 0549  BP: 142/66 170/74 120/50 153/48  Pulse: 93 96 94 87  Temp: 98.9 F (37.2 C) 98.7 F (37.1 C) 98.5 F (36.9 C) 98.9 F (37.2 C)  TempSrc: Oral Oral Oral Oral  Resp: 16 18 16 16   Height:      Weight:      SpO2:  94% 95% 91%    Intake/Output Summary (Last 24 hours) at 05/25/12 1112 Last data filed at 05/25/12 0800  Gross per 24 hour  Intake  608.5 ml  Output    950 ml  Net -341.5 ml    Exam:   General:  Pt is alert, follows commands appropriately, not in acute distress  Cardiovascular: Regular rate and rhythm, S1/S2, no murmurs, no rubs, no gallops  Respiratory: Clear to auscultation bilaterally, no wheezing, no crackles, no rhonchi  Abdomen: Soft, non tender, non distended, bowel sounds present, no guarding  Extremities: No edema, pulses  DP and PT palpable bilaterally  Neuro: Grossly nonfocal  Data Reviewed: Basic Metabolic Panel:  Lab 05/25/12 1610 05/24/12 0610 05/23/12 0600 05/20/12 0650  NA 138 137 139 139  K 3.9 3.9 3.9 3.7  CL 100  99 105 109  CO2 33* 31 30 25   GLUCOSE 206* 188* 174* 77  BUN 16 14 18 13   CREATININE 0.54 0.53 0.59 0.53  CALCIUM 9.0 8.9 8.5 8.4  MG -- -- -- --  PHOS -- -- -- --   CBC:  Lab 05/25/12 0550 05/24/12 0610 05/23/12 0600 05/22/12 0414 05/21/12 0530  WBC 5.0 4.3 2.5* 1.7* 1.2*  NEUTROABS 3.6 3.3 0.5* 0.7* 0.2*  HGB 9.2* 10.0* 10.1* 11.0* 10.7*  HCT 27.5* 29.6* 30.1* 31.8* 31.0*  MCV 90.8 89.7 89.6 87.8 87.6  PLT 76* 49* 11* 15* 21*   CBG:  Lab 05/24/12 0727 05/23/12 2203 05/23/12 1732 05/23/12 1208 05/23/12 0746  GLUCAP 157* 230* 185* 316* 143*    URINE CULTURE     Status: Normal   Collection Time   05/17/12  7:16 PM      Component Value Range Status Comment   Specimen Description URINE, RANDOM   Final    Special Requests NONE   Final    Culture  Setup Time 05/18/2012 11:46   Final    Colony Count 9,000 COLONIES/ML   Final    Culture INSIGNIFICANT GROWTH   Final    Report Status 05/19/2012 FINAL   Final   CULTURE, BLOOD (ROUTINE X 2)     Status: Normal   Collection Time   05/17/12  8:33 PM      Component Value Range Status Comment   Specimen Description BLOOD LEFT ARM   Final    Special Requests BOTTLES DRAWN AEROBIC AND ANAEROBIC   Final    Culture  Setup Time 05/18/2012 14:10   Final    Culture NO GROWTH 5 DAYS   Final    Report Status 05/24/2012 FINAL   Final   CULTURE, BLOOD (ROUTINE X 2)     Status: Normal   Collection Time   05/17/12  8:45 PM      Component Value Range Status Comment   Specimen Description BLOOD LEFT ARM   Final    Special Requests BOTTLES DRAWN AEROBIC AND ANAEROBIC   Final    Culture  Setup Time 05/18/2012 14:10   Final    Culture NO GROWTH 5 DAYS   Final    Report Status 05/24/2012 FINAL   Final      Studies: No results found.  Scheduled Meds:   . amLODipine  2.5 mg Oral q morning - 10a  . aztreonam  2 g Intravenous Q8H  . calcium carbonate  1 tablet Oral Daily  . feeding supplement  237 mL Oral BID BM  . fluconazole  100 mg  Oral Daily  . insulin aspart  0-15 Units Subcutaneous TID WC  . insulin aspart  0-5 Units Subcutaneous QHS  . insulin aspart  4 Units Subcutaneous TID WC  . levofloxacin (LEVAQUIN)   750 mg Intravenous Q48H  . metoprolol  75 mg Oral BID  . omega-3 acid ethyl   1 g Oral Daily  . pregabalin  75 mg Oral BID  . simvastatin  5 mg Oral QHS  . vancomycin  1,000 mg Intravenous Q24H   Continuous Infusions:   . 0.9 % NaCl with KCl 20 mEq / L 20 mL/hr at 05/22/12 1500

## 2012-05-26 ENCOUNTER — Telehealth: Payer: Self-pay | Admitting: *Deleted

## 2012-05-26 LAB — CBC WITH DIFFERENTIAL/PLATELET
Basophils Relative: 1 % (ref 0–1)
Eosinophils Relative: 0 % (ref 0–5)
HCT: 29.6 % — ABNORMAL LOW (ref 36.0–46.0)
Hemoglobin: 9.8 g/dL — ABNORMAL LOW (ref 12.0–15.0)
Lymphs Abs: 1.2 10*3/uL (ref 0.7–4.0)
MCH: 30.2 pg (ref 26.0–34.0)
MCV: 91.1 fL (ref 78.0–100.0)
Monocytes Absolute: 0 10*3/uL — ABNORMAL LOW (ref 0.1–1.0)
Monocytes Relative: 1 % — ABNORMAL LOW (ref 3–12)
Neutro Abs: 2.5 10*3/uL (ref 1.7–7.7)
RBC: 3.25 MIL/uL — ABNORMAL LOW (ref 3.87–5.11)
WBC Morphology: INCREASED
WBC: 3.7 10*3/uL — ABNORMAL LOW (ref 4.0–10.5)

## 2012-05-26 MED ORDER — LORAZEPAM 0.5 MG PO TABS: 0.5000 mg | ORAL_TABLET | Freq: Four times a day (QID) | ORAL | Status: DC | PRN

## 2012-05-26 MED ORDER — LEVOFLOXACIN 750 MG PO TABS: 750.0000 mg | ORAL_TABLET | Freq: Every day | ORAL | Status: DC

## 2012-05-26 MED ORDER — FLUCONAZOLE 100 MG PO TABS: 100.0000 mg | ORAL_TABLET | Freq: Every day | ORAL | Status: DC

## 2012-05-26 MED ORDER — HEPARIN SOD (PORK) LOCK FLUSH 100 UNIT/ML IV SOLN
INTRAVENOUS | Status: AC
  Filled 2012-05-26: qty 5

## 2012-05-26 MED ORDER — FLUTICASONE PROPIONATE 50 MCG/ACT NA SUSP: 2.0000 | Freq: Every day | NASAL | Status: DC

## 2012-05-26 MED ORDER — PREGABALIN 75 MG PO CAPS: 75.0000 mg | ORAL_CAPSULE | Freq: Two times a day (BID) | ORAL | Status: DC

## 2012-05-26 NOTE — Telephone Encounter (Signed)
Opened in error

## 2012-05-26 NOTE — Discharge Summary (Signed)
Physician Discharge Summary  BERDIE MALTER ONG:[id number] DOB: Aug 10, 1925 DOA: 05/17/2012  PCP: [name], MD  Admit date: 05/17/2012 Discharge date: 05/26/2012  Recommendations for Outpatient Follow-up:  1. Followup with oncology per scheduled appointment 2. Followup with PCP in one to 2 weeks after discharge or sooner if symptoms persist 3. 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 Welton Flakes, status post chemotherapy approximately 2 weeks prior to this admission, who presented to the hospital on 05/17/2012 with weakness, fever, and chills. Upon initial evaluation emergency department, the patient was found to be neutropenic and had evidence of pneumonia on chest radiography.  Since admission, patient has received total of 2 units of platelets and 4 units of PRBC's. White blood cell count has normalized and platelet count is trending up.   Assessment/Plan:   Principal Problem:  *Febrile neutropenia with healthcare associated pneumonia  Admitted and placed on empiric antibiotics including vancomycin, Levaquin, and aztreonam.  Blood cultures, influenza panel negative. Procalcitonin and lactic acid not elevated.  WBC within normal limit and  Neupogen was discontinued 05/24/2012 Patient will continue Levaquin for 7 days on discharge. Vancomycin and aztreonam discontinued today 05/26/2012. Active Problems:  Constipation  One bottle of magnesium citrate given 05/20/12.  Has daily BM Hypokalemia  Resolved with potassium added to IV fluids. Blurred vision  MRI brain with no obvious metastasis DIABETES MELLITUS II, UNCOMPLICATED  CBGs controlled. Continue insulin per home regimen HYPERLIPIDEMIA  Continue statin, low dose and fish oil. HYPERTENSION, BENIGN SYSTEMIC  Continue Norvasc and metoprolol per home regimen Normocytic Anemia  Likely anemia of chronic disease and the sequela of prior chemotherapy.  Status post 2 units of packed red blood cells 05/18/2012 and an additional 2 units of packed red blood cells 05/19/2012.  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 Welton Flakes. Thrombocytopenia  Likely the sequela of prior chemotherapy.  Status post 2 units of platelets since admission.  Platelet count today 75 Severe protein-calorie malnutrition in the context of chronic illness / underweight  Dietitian consultation performed 05/18/2012.  Glucerna shakes ordered.   Code Status: Full  Family Communication: Family at bedside Disposition Plan: Home today  Medical Consultants:  Dr. Drue Second, Oncology Other Consultants:  Dietitian Anti-infectives:  Levaquin 05/17/2012---> for 7 days on discharge  Vancomycin 05/17/2012---> 05/26/2012 Aztreonam 05/17/2012--->05/26/2012  Manson Passey, MD  [facility]  Pager [phone]   Discharge Exam: Filed Vitals:   05/26/12 0640  BP: 148/68  Pulse: 85  Temp: 98.6 F (37 C)  Resp: 16   Filed Vitals:   05/25/12 0549 05/25/12 1410 05/25/12 2055 05/26/12 0640  BP: 153/48 138/78 165/69 148/68  Pulse: 87 90 98 85  Temp: 98.9 F (37.2 C) 98.6 F (37 C) 98.3 F (36.8 C) 98.6 F (37 C)  TempSrc: Oral  Oral Oral  Resp:  16 18 16  16   Height:      Weight:      SpO2: 91% 94% 98% 94%    General: Pt is alert, follows commands appropriately, not in acute distress Cardiovascular: Regular rate and rhythm, S1/S2 +, no murmurs, no rubs, no gallops Respiratory: Clear to auscultation bilaterally, no wheezing, no crackles, no rhonchi Abdominal: Soft, non tender, non distended, bowel sounds +, no guarding Extremities: no edema, no cyanosis, pulses palpable bilaterally DP and PT Neuro: Grossly nonfocal  Discharge Instructions  Discharge Orders    Future Appointments: Provider: Department: Dept Phone: Center:   06/01/2012 10:15 AM [facility] [phone] None   06/01/2012 10:45 AM Augustin Schooling, NP [facility] MEDICAL ONCOLOGY [phone] None   06/01/2012 11:45 AM [facility] [place] [facility] [phone] None   06/02/2012 11:45 AM [facility] [place] [facility] [phone] None   06/03/2012 11:45 AM [facility] [place] [facility] [phone] None   06/04/2012 11:45 AM [facility] [place] [facility] [phone] None   06/05/2012 11:45 AM [facility] [place] [facility] [phone] None   06/15/2012 10:15 AM Dava Najjar [facility] [phone] None   06/15/2012 10:45 AM Augustin Schooling, NP [place] [facility] [phone] None   06/22/2012 10:45 AM Krista Blue [facility] [phone] None   06/22/2012 11:15 AM Augustin Schooling, NP [address] [facility] [phone] None     Future Orders Please Complete By Expires   Diet - low sodium heart healthy      Increase activity slowly      Call MD for:  persistant nausea and vomiting      Call MD for:  severe uncontrolled pain      Call MD for:  difficulty breathing,  headache or visual disturbances      Call MD for:  persistant dizziness or light-headedness          Medication List     As of 05/26/2012 12:18 PM    STOP taking these medications         aspirin EC 81 MG tablet      TAKE these medications         amLODipine 5 MG tablet   Commonly known as: NORVASC   Take 2.5 mg by mouth every morning.      calcium gluconate 500 MG tablet   Take 500 mg by mouth daily.      conjugated estrogens vaginal cream   Commonly known as: PREMARIN   Place 0.5 g vaginally once a week. Sunday.      dexamethasone 4 MG tablet   Commonly known as: DECADRON   Take 2 tablets (8 mg total) by mouth 2 (two) times daily with a meal. Take daily starting the day after chemotherapy for 2 days. Take with food.      Fish Oil 1000 MG Caps   Take 1,000 mg by mouth daily.      fluconazole 100 MG tablet   Commonly known as: DIFLUCAN   Take 1 tablet (100 mg total) by mouth daily.      fluticasone 50 MCG/ACT nasal spray   Commonly known as: FLONASE   Place 2 sprays into the nose daily.      freestyle lancets   1 each by Other route 4 (  four) times daily. Use as instructed      glucose blood test strip   Test blood sugar QID or as directed by MD.      insulin lispro 100 UNIT/ML injection   Commonly known as: HUMALOG   Inject 0-6 Units into the skin 3 (three) times daily before meals.      Insulin Pen Needle 31G X 5 MM Misc   100 Units by Does not apply route 4 (four) times daily.      levofloxacin 750 MG tablet   Commonly known as: LEVAQUIN   Take 1 tablet (750 mg total) by mouth daily.      lidocaine-prilocaine cream   Commonly known as: EMLA   Apply topically as needed.      LORazepam 0.5 MG tablet   Commonly known as: ATIVAN   Take 1 tablet (0.5 mg total) by mouth every 6 (six) hours as needed for anxiety. Nausea      magic mouthwash w/lidocaine Soln   Take 5 mLs by mouth 4 (four) times daily as needed. Used after chemo for prevention of thrush.       metoprolol 50 MG tablet   Commonly known as: LOPRESSOR   Take 1 tablet (50 mg total) by mouth 2 (two) times daily.      ondansetron 8 MG tablet   Commonly known as: ZOFRAN   Take 1 tablet (8 mg total) by mouth 2 (two) times daily. Take two times a day starting the day after chemo for 2 days. Then take two times a day as needed for nausea or vomiting.      pregabalin 75 MG capsule   Commonly known as: LYRICA   Take 1 capsule (75 mg total) by mouth 2 (two) times daily.      PRESCRIPTION MEDICATION   Inject into the vein every 21 ( twenty-one) days. Dacogen infusion for 5 days every 3 weeks.      prochlorperazine 10 MG tablet   Commonly known as: COMPAZINE   Take 1 tablet (10 mg total) by mouth every 6 (six) hours as needed (Nausea or vomiting).      prochlorperazine 25 MG suppository   Commonly known as: COMPAZINE   Place 1 suppository (25 mg total) rectally every 12 (twelve) hours as needed for nausea.      simvastatin 5 MG tablet   Commonly known as: ZOCOR   Take 5 mg by mouth at bedtime.           Follow-up Information    Follow up with [name], MD. In 2 weeks.   Contact information:   [address] Christena Flake [place] [address] [phone]           The results of significant diagnostics from this hospitalization (including imaging, microbiology, ancillary and laboratory) are listed below for reference.    Significant Diagnostic Studies: Mr [name] Contrast  05/18/2012  *RADIOLOGY REPORT*  Clinical Data: Sudden onset of blurred vision.  History of  AML, hypertension and diabetes.  MRI HEAD WITHOUT CONTRAST  Technique:  Multiplanar, multiecho pulse sequences of the brain and surrounding structures were obtained according to standard protocol without intravenous contrast.  Comparison: 07/13/2003 head CT.  No comparison brain MR.  Findings: No acute infarct.  No intracranial hemorrhage.  Moderate small vessel disease type changes.  Global atrophy without  hydrocephalus.  No intracranial mass lesion detected on this unenhanced exam.  Diffuse altered signal intensity of bone marrow.  This may reflect changes of  anemia although infiltration by tumor not excluded in this patient with history of leukemia.  Partially empty sella incidentally noted.  Cervical medullary junction and pineal region unremarkable.  Slight asymmetry of the superior ophthalmic vein otherwise orbital structures unremarkable.  No obvious abnormality at the cavernous sinus level.  Minimal to mild paranasal sinus mucosal thickening.  Major intracranial vascular structures are patent.  IMPRESSION: No acute infarct.  Moderate small vessel disease type changes.  Global atrophy without hydrocephalus.  Diffuse altered signal intensity of bone marrow.  This may reflect changes of anemia although infiltration by tumor not excluded in this patient with history of leukemia.  Minimal to mild paranasal sinus mucosal thickening.   Original Report Authenticated By: Lacy Duverney, M.D.    Dg Chest Port 1 View  05/17/2012  *RADIOLOGY REPORT*  Clinical Data: History of AML with fever.  PORTABLE CHEST - 1 VIEW  Comparison: 10/17/2011  Findings: Single view of the chest demonstrates a Port-A-Cath. Port-A-Cath tip is in the SVC region.  There is a new round opacity in the right hilum.  This opacity roughly measures 4.0 x 2.4 cm. In addition, there are interstitial densities in the right lower lung.  Heart size is normal.  Atherosclerotic calcifications of the aortic arch.  IMPRESSION: New nodular opacity in the right hilum and slightly prominent interstitial densities at the right lung base.  Findings could represent multifocal pneumonia but cannot exclude a right hilar mass or lymphadenopathy.  These findings could better characterized with a chest CT.   Original Report Authenticated By: Richarda Overlie, M.D.     Microbiology: Recent Results (from the past 240 hour(s))  URINE CULTURE     Status: Normal   Collection  Time   05/17/12  7:16 PM      Component Value Range Status Comment   Specimen Description URINE, RANDOM   Final    Special Requests NONE   Final    Culture  Setup Time 05/18/2012 11:46   Final    Colony Count 9,000 COLONIES/ML   Final    Culture INSIGNIFICANT GROWTH   Final    Report Status 05/19/2012 FINAL   Final   CULTURE, BLOOD (ROUTINE X 2)     Status: Normal   Collection Time   05/17/12  8:33 PM      Component Value Range Status Comment   Specimen Description BLOOD LEFT ARM   Final    Special Requests BOTTLES DRAWN AEROBIC AND ANAEROBIC   Final    Culture  Setup Time 05/18/2012 14:10   Final    Culture NO GROWTH 5 DAYS   Final    Report Status 05/24/2012 FINAL   Final   CULTURE, BLOOD (ROUTINE X 2)     Status: Normal   Collection Time   05/17/12  8:45 PM      Component Value Range Status Comment   Specimen Description BLOOD LEFT ARM   Final    Special Requests BOTTLES DRAWN AEROBIC AND ANAEROBIC   Final    Culture  Setup Time 05/18/2012 14:10   Final    Culture NO GROWTH 5 DAYS   Final    Report Status 05/24/2012 FINAL   Final      Labs: Basic Metabolic Panel:  Lab 05/25/12 4098 05/24/12 0610 05/23/12 0600 05/20/12 0650  NA 138 137 139 139  K 3.9 3.9 3.9 3.7  CL 100 99 105 109  CO2 33* 31 30 25   GLUCOSE 206* 188* 174* 77  BUN 16 14 18 13   CREATININE 0.54 0.53 0.59 0.53  CALCIUM 9.0 8.9 8.5 8.4  MG -- -- -- --  PHOS -- -- -- --   Liver Function Tests: No results found for this basename: AST:5,ALT:5,ALKPHOS:5,BILITOT:5,PROT:5,ALBUMIN:5 in the last 168 hours No results found for this basename: LIPASE:5,AMYLASE:5 in the last 168 hours No results found for this basename: AMMONIA:5 in the last 168 hours CBC:  Lab 05/26/12 0635 05/25/12 0550 05/24/12 0610 05/23/12 0600 05/22/12 0414  WBC 3.7* 5.0 4.3 2.5* 1.7*  NEUTROABS 2.5 3.6 3.3 0.5* 0.7*  HGB 9.8* 9.2* 10.0* 10.1* 11.0*  HCT 29.6* 27.5* 29.6* 30.1* 31.8*  MCV 91.1 90.8 89.7 89.6 87.8  PLT 75* 76* 49*  11* 15*   Cardiac Enzymes: No results found for this basename: CKTOTAL:5,CKMB:5,CKMBINDEX:5,TROPONINI:5 in the last 168 hours BNP: BNP (last 3 results) No results found for this basename: PROBNP:3 in the last 8760 hours CBG:  Lab 05/24/12 0727 05/23/12 2203 05/23/12 1732 05/23/12 1208 05/23/12 0746  GLUCAP 157* 230* 185* 316* 143*    Time coordinating discharge: Over 30 minutes  Signed:  Manson Passey, MD  [facility] 05/26/2012, 12:18 PM  Pager #: [phone]

## 2012-05-26 NOTE — Telephone Encounter (Signed)
Transitional Care Management :   Discharged from  Hospital on 05-26-12:  Diagnosis: Principal Problem: *Febrile Neutropenia with healthcare associated pneumonia Active Problems: Diabetes Mellitus 11, uncomplicated. Hyperlipidemia Hypertension,benign systemic anemiaAcute myeloid leukemia,without mention of  having achieved remission Thrombocytopenia Pancytopenia  Talked with pt and she is alert and cooperative, but states she is very weak after being discharged today.  She is  Compliant with medication and she has 4 new medications from discharge and has already received them from pharmacy.  Appointment made for 1-30 with Dr, Caryl Never.

## 2012-05-27 LAB — GLUCOSE, CAPILLARY
Glucose-Capillary: 164 mg/dL — ABNORMAL HIGH (ref 70–99)
Glucose-Capillary: 197 mg/dL — ABNORMAL HIGH (ref 70–99)
Glucose-Capillary: 199 mg/dL — ABNORMAL HIGH (ref 70–99)
Glucose-Capillary: 168 mg/dL — ABNORMAL HIGH (ref 70–99)
Glucose-Capillary: 291 mg/dL — ABNORMAL HIGH (ref 70–99)

## 2012-05-27 NOTE — Telephone Encounter (Signed)
Pt called today stating she is too weak to co m in tomorrow and will call and make rov when strength returns

## 2012-05-27 NOTE — Telephone Encounter (Signed)
Pt called today cancelling f/u appointment with dr Meredith Rodriguez for tomorrow,stating she is just too weak to come to office- states she will come later when she has more strength

## 2012-05-28 ENCOUNTER — Ambulatory Visit: Payer: Medicare Other | Admitting: Family Medicine

## 2012-06-01 ENCOUNTER — Ambulatory Visit (HOSPITAL_BASED_OUTPATIENT_CLINIC_OR_DEPARTMENT_OTHER): Payer: Medicare Other | Admitting: Adult Health

## 2012-06-01 ENCOUNTER — Other Ambulatory Visit (HOSPITAL_BASED_OUTPATIENT_CLINIC_OR_DEPARTMENT_OTHER): Payer: Medicare Other | Admitting: Lab

## 2012-06-01 ENCOUNTER — Encounter: Payer: Self-pay | Admitting: Adult Health

## 2012-06-01 ENCOUNTER — Telehealth: Payer: Self-pay | Admitting: Oncology

## 2012-06-01 ENCOUNTER — Ambulatory Visit: Payer: Medicare Other

## 2012-06-01 VITALS — BP 133/64 | HR 87 | Temp 98.9°F | Resp 20 | Ht 64.0 in | Wt 101.3 lb

## 2012-06-01 DIAGNOSIS — D649 Anemia, unspecified: Secondary | ICD-10-CM

## 2012-06-01 DIAGNOSIS — C9200 Acute myeloblastic leukemia, not having achieved remission: Secondary | ICD-10-CM

## 2012-06-01 LAB — CBC WITH DIFFERENTIAL/PLATELET
BASO%: 0.7 % (ref 0.0–2.0)
EOS%: 0.2 % (ref 0.0–7.0)
HCT: 25.7 % — ABNORMAL LOW (ref 34.8–46.6)
MCH: 30.5 pg (ref 25.1–34.0)
MCHC: 34.1 g/dL (ref 31.5–36.0)
NEUT%: 83.8 % — ABNORMAL HIGH (ref 38.4–76.8)
RBC: 2.88 10*6/uL — ABNORMAL LOW (ref 3.70–5.45)
RDW: 14.5 % (ref 11.2–14.5)
lymph#: 0.6 10*3/uL — ABNORMAL LOW (ref 0.9–3.3)

## 2012-06-01 LAB — COMPREHENSIVE METABOLIC PANEL (CC13)
ALT: 52 U/L (ref 0–55)
AST: 29 U/L (ref 5–34)
Calcium: 9.2 mg/dL (ref 8.4–10.4)
Chloride: 99 mEq/L (ref 98–107)
Creatinine: 0.7 mg/dL (ref 0.6–1.1)
Sodium: 137 mEq/L (ref 136–145)
Total Protein: 6.2 g/dL — ABNORMAL LOW (ref 6.4–8.3)

## 2012-06-01 NOTE — Patient Instructions (Addendum)
We will see you back weekly for labs, and on 3/3 for an appointment.  Please call us if you have any questions or concerns.

## 2012-06-01 NOTE — Progress Notes (Signed)
OFFICE PROGRESS NOTE  CC  [name], MD [address] [place] [address]  DIAGNOSIS: 79 year old female with severe and profound anemia likely due to a primary bone marrow problem. 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  #2 patient has been on Procrit every 2 weeks unfortunately this has not effective and we will discontinue this.  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  #4 Patient received 3 cycles of Decitabine chemotherapy, her third cycle was complicated by febrile neutropenia and pneumonia causing hospitalization.    CURRENT THERAPY:Supportive care  INTERVAL HISTORY: Meredith Rodriguez 79 y.o. female returns for follow up today.  She was recently hospitalized for febrile neutropenia and pneumonia.  She required multiple transfusions and IV antibiotics.  She is slowly recovering.  She still feels fatigued, and still has a residual cough from the pneumonia.  She also is c/o of an uncomfortable feeling in her nares.  Otherwise, a 10 point ROS is neg.    MEDICAL HISTORY: Past Medical History  Diagnosis Date  . Hypertension   . Diabetes mellitus   . Hyperkalemia   . IBS (irritable bowel syndrome)   . Hypercholesterolemia   . Cervical radiculopathy   . Anemia   . Cancer     leukemia    ALLERGIES:  is allergic to amoxicillin; cephalexin; metformin and related; phenergan; and procardia.  MEDICATIONS:  Current Outpatient Prescriptions  Medication Sig Dispense Refill  . Alum & Mag Hydroxide-Simeth (MAGIC MOUTHWASH W/LIDOCAINE) SOLN Take 5 mLs by mouth 4 (four) times daily as needed. Used after chemo for prevention of thrush.      Marland Kitchen amLODipine (NORVASC) 5 MG tablet Take 2.5 mg by mouth every morning.       . calcium gluconate 500 MG tablet Take 500 mg by mouth daily.      Marland Kitchen conjugated  estrogens (PREMARIN) vaginal cream Place 0.5 g vaginally once a week. Sunday.      Marland Kitchen dexamethasone (DECADRON) 4 MG tablet Take 2 tablets (8 mg total) by mouth 2 (two) times daily with a meal. Take daily starting the day after chemotherapy for 2 days. Take with food.  30 tablet  1  . fluconazole (DIFLUCAN) 100 MG tablet Take 1 tablet (100 mg total) by mouth daily.  5 tablet  0  . fluticasone (FLONASE) 50 MCG/ACT nasal spray Place 2 sprays into the nose daily.  16 g  2  . glucose blood (FREESTYLE LITE) test strip Test blood sugar QID or as directed by MD.  100 each  11  . insulin lispro (HUMALOG KWIKPEN) 100 UNIT/ML injection Inject 0-6 Units into the skin 3 (three) times daily before meals.  3 mL  12  . Insulin Pen Needle (B-D UF III MINI PEN NEEDLES) 31G X 5 MM MISC 100 Units by Does not apply route 4 (four) times daily.  200 each  6  . Lancets (FREESTYLE) lancets 1 each by Other route 4 (four) times daily. Use as instructed  200 each  12  . levofloxacin (LEVAQUIN) 750 MG tablet Take 1 tablet (750 mg total) by mouth daily.  7 tablet  0  . lidocaine-prilocaine (EMLA) cream Apply topically as needed.  30 g  4  . LORazepam (ATIVAN) 0.5 MG tablet Take 1 tablet (0.5 mg total) by mouth every 6 (six) hours as needed for anxiety. Nausea  30 tablet  0  . metoprolol (LOPRESSOR) 50 MG tablet Take 1 tablet (50 mg total) by mouth 2 (two) times daily.  180 tablet  3  . Omega-3 Fatty Acids (FISH OIL) 1000 MG CAPS Take 1,000 mg by mouth daily.       . ondansetron (ZOFRAN) 8 MG tablet Take 1 tablet (8 mg total) by mouth 2 (two) times daily. Take two times a day starting the day after chemo for 2 days. Then take two times a day as needed for nausea or vomiting.  30 tablet  1  . pregabalin (LYRICA) 75 MG capsule Take 1 capsule (75 mg total) by mouth 2 (two) times daily.  60 capsule  0  . PRESCRIPTION MEDICATION Inject into the vein every 21 ( twenty-one) days. Dacogen infusion for 5 days every 3 weeks.      .  prochlorperazine (COMPAZINE) 10 MG tablet Take 1 tablet (10 mg total) by mouth every 6 (six) hours as needed (Nausea or vomiting).  30 tablet  1  . prochlorperazine (COMPAZINE) 25 MG suppository Place 1 suppository (25 mg total) rectally every 12 (twelve) hours as needed for nausea.  12 suppository  3  . simvastatin (ZOCOR) 5 MG tablet Take 5 mg by mouth at bedtime.      . [DISCONTINUED] glimepiride (AMARYL) 2 MG tablet Take 1 tablet (2 mg total) by mouth every morning.  30 tablet  11   Current Facility-Administered Medications  Medication Dose Route Frequency Provider Last Rate Last Dose  . TDaP (BOOSTRIX) injection 0.5 mL  0.5 mL Intramuscular Once [name], MD        SURGICAL HISTORY:  Past Surgical History  Procedure Date  . Varicose veins     surgey 1959  . Tonsilectomy, adenoidectomy, bilateral myringotomy and tubes   . Esophagogastroduodenoscopy 04/01/2011    Procedure: ESOPHAGOGASTRODUODENOSCOPY (EGD);  Surgeon: Barrie Folk, MD;  Location: [facility] ENDOSCOPY;  Service: Endoscopy;  Laterality: N/A;    REVIEW OF SYSTEMS:   General: fatigue (+), night sweats (-), fever (-), pain (-) Lymph: palpable nodes (-) HEENT: vision changes (-), mucositis (-), gum bleeding (-), epistaxis (-) Cardiovascular: chest pain (-), palpitations (-) Pulmonary: shortness of breath (-), dyspnea on exertion (-), cough (-), hemoptysis (-) GI:  Early satiety (-), melena (-), dysphagia (-), nausea/vomiting (-), diarrhea (-) GU: dysuria (-), hematuria (-), incontinence (-) Musculoskeletal: joint swelling (-), joint pain (-), back pain (-) Neuro: weakness (-), numbness (-), headache (-), confusion (-) Skin: Rash (-), lesions (-), dryness (-) Psych: depression (-), suicidal/homicidal ideation (-), feeling of hopelessness (-)   PHYSICAL EXAMINATION: BP 133/64  Pulse 87  Temp 98.9 F (37.2 C)  Resp 20  Ht 5\' 4"  (1.626 m)  Wt 101 lb 4.8 oz (45.949 kg)  BMI 17.39 kg/m2 General: Patient is a well  appearing female in no acute distress HEENT: PERRLA, sclerae anicteric no conjunctival pallor, MMM no erythema or exudate, right nare with large blood clot Neck: supple, no lymphadenopathy Lungs: clear to auscultation bilaterally, no wheezes, rhonchi, or rales Cardiovascular: regular rate rhythm, S1, S2, no murmurs, rubs or gallops Abdomen: Soft, non-tender, non-distended, normoactive bowel sounds, no HSM Extremities: warm and well perfused, no clubbing, cyanosis, or edema Skin: No rashes.    Neuro: Non-focal ECOG PERFORMANCE STATUS: 1  LABORATORY DATA: Lab Results  Component Value Date   WBC 4.3 06/01/2012   HGB 8.8* 06/01/2012   HCT 25.7* 06/01/2012   MCV 89.2 06/01/2012   PLT 80*  06/01/2012      Chemistry      Component Value Date/Time   NA 138 05/25/2012 0550   NA 132* 05/11/2012 1012   K 3.9 05/25/2012 0550   K 4.1 05/11/2012 1012   CL 100 05/25/2012 0550   CL 99 05/11/2012 1012   CO2 33* 05/25/2012 0550   CO2 27 05/11/2012 1012   BUN 16 05/25/2012 0550   BUN 32.0* 05/11/2012 1012   CREATININE 0.54 05/25/2012 0550   CREATININE 0.9 05/11/2012 1012   CREATININE 0.69 07/18/2010 1202   GLU 215* 02/26/2012 1255      Component Value Date/Time   CALCIUM 9.0 05/25/2012 0550   CALCIUM 9.2 05/11/2012 1012   ALKPHOS 42 05/18/2012 0955   ALKPHOS 68 05/11/2012 1012   AST 16 05/18/2012 0955   AST 15 05/11/2012 1012   ALT 19 05/18/2012 0955   ALT 24 05/11/2012 1012   BILITOT 1.1 05/18/2012 0955   BILITOT 0.65 05/11/2012 1012       RADIOGRAPHIC STUDIES:  No results found.  ASSESSMENT: 79 year old female with:  1.  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  2. 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 This is discussed with the daughter and the patient today recommendations were made as below   #3 patient and I and her daughters had an extensive discussion today about  the likely transformation to acute leukemia. We discussed the process of chemotherapy, risks, benefits, and potential adverse effects of the drug.  Through much conversation between myself, the patient, and her children, the patient has decided to proceed with Decitabine chemotherapy.  Her chemotherapy has been discontinued due to her complication of febrile neutropenia and pneumonia.  Much more chemotherapy could take her life, therefore, we will continue with supportive care only.    #5 Anemia  PLAN:  #1 Ms. Balcom is here after being hospitalized due to febrile neutropenia and illness.  Dr. Welton Flakes and I had a long discussion with her regarding her age, and how sick she was when she was hospitalized, that she likely wouldn't tolerate more treatment with Decitabine.  We will therefore hold further treatment and continue with supportive care only.  She will have weekly lab draws, month appointments, and transfuse her as needed.    #2 I recommended saline nasal spray for her nose.    All questions were answered. The patient knows to call the clinic with any problems, questions or concerns. We can certainly see the patient much sooner if necessary.  I spent 25 minutes counseling the patient face to face. The total time spent in the appointment was 30 minutes.  This case was reviewed with Dr. Welton Flakes.    Cherie Ouch Lyn Hollingshead, NP Medical Oncology [facility] Phone: [phone] 06/01/2012, 11:07 AM

## 2012-06-02 ENCOUNTER — Ambulatory Visit: Payer: Medicare Other

## 2012-06-03 ENCOUNTER — Ambulatory Visit: Payer: Medicare Other

## 2012-06-04 ENCOUNTER — Ambulatory Visit: Payer: Medicare Other

## 2012-06-05 ENCOUNTER — Ambulatory Visit: Payer: Medicare Other

## 2012-06-05 ENCOUNTER — Telehealth: Payer: Self-pay | Admitting: Medical Oncology

## 2012-06-05 NOTE — Telephone Encounter (Signed)
Pt [name] "wants to know about blood sugar, Mardella Layman had increased it and now I'm not on chemo anymore and I go back to the old treatment, I was told to test 4 times a day, they won't give it to me for 4 times, only for testing 3 times a day. Can I go to prior schedule of treatment before chemo." sched appts Lab 02/10, lab 02/17, lab/NP 03/03. Will forward mssg to MD/NP for review.

## 2012-06-08 ENCOUNTER — Other Ambulatory Visit: Payer: Self-pay | Admitting: Medical Oncology

## 2012-06-08 ENCOUNTER — Other Ambulatory Visit (HOSPITAL_BASED_OUTPATIENT_CLINIC_OR_DEPARTMENT_OTHER): Payer: Medicare Other | Admitting: Lab

## 2012-06-08 ENCOUNTER — Encounter (HOSPITAL_COMMUNITY)
Admission: RE | Admit: 2012-06-08 | Discharge: 2012-06-08 | Disposition: A | Discharge status: Home / Self Care | Payer: Medicare Other | Source: Ambulatory Visit | Attending: Oncology | Admitting: Oncology

## 2012-06-08 ENCOUNTER — Other Ambulatory Visit: Payer: Self-pay | Admitting: Adult Health

## 2012-06-08 ENCOUNTER — Telehealth: Payer: Self-pay | Admitting: Medical Oncology

## 2012-06-08 DIAGNOSIS — D630 Anemia in neoplastic disease: Secondary | ICD-10-CM

## 2012-06-08 DIAGNOSIS — C9200 Acute myeloblastic leukemia, not having achieved remission: Secondary | ICD-10-CM

## 2012-06-08 LAB — CBC WITH DIFFERENTIAL/PLATELET
BASO%: 0.9 % (ref 0.0–2.0)
EOS%: 1 % (ref 0.0–7.0)
HCT: 22 % — ABNORMAL LOW (ref 34.8–46.6)
LYMPH%: 13.3 % — ABNORMAL LOW (ref 14.0–49.7)
MCH: 30.9 pg (ref 25.1–34.0)
MCHC: 34.7 g/dL (ref 31.5–36.0)
NEUT%: 84.5 % — ABNORMAL HIGH (ref 38.4–76.8)
Platelets: 49 10*3/uL — ABNORMAL LOW (ref 145–400)

## 2012-06-08 NOTE — Telephone Encounter (Signed)
She can have it 3 times a day

## 2012-06-08 NOTE — Telephone Encounter (Signed)
Pt notified of blood transfusion tomorrow at 1300 and I told her to check in  at 1245. Pt voices understanding.

## 2012-06-09 ENCOUNTER — Ambulatory Visit (HOSPITAL_BASED_OUTPATIENT_CLINIC_OR_DEPARTMENT_OTHER): Payer: Medicare Other

## 2012-06-09 VITALS — BP 168/68 | HR 75 | Temp 97.1°F | Resp 18

## 2012-06-09 DIAGNOSIS — D630 Anemia in neoplastic disease: Secondary | ICD-10-CM

## 2012-06-09 DIAGNOSIS — D4620 Refractory anemia with excess of blasts, unspecified: Secondary | ICD-10-CM

## 2012-06-09 MED ORDER — ACETAMINOPHEN 325 MG PO TABS
650.0000 mg | ORAL_TABLET | Freq: Once | ORAL | Status: AC
  Administered 2012-06-09: 650 mg via ORAL

## 2012-06-09 MED ORDER — FUROSEMIDE 10 MG/ML IJ SOLN
20.0000 mg | Freq: Once | INTRAMUSCULAR | Status: AC
  Administered 2012-06-09: 20 mg via INTRAVENOUS

## 2012-06-09 MED ORDER — DIPHENHYDRAMINE HCL 25 MG PO CAPS
25.0000 mg | ORAL_CAPSULE | Freq: Once | ORAL | Status: AC
  Administered 2012-06-09: 25 mg via ORAL

## 2012-06-09 NOTE — Progress Notes (Signed)
Patient blood pressure increased from 111/47 to 155/61 at her one hour check. Patient denies SOB, itching or dyspnea. Patient states she has a history of high blood pressure and takes blood pressure medicine twice daily. Augustin Schooling, NP notified of blood pressure change, order given for lasix 20 mg IV between the patient's units of blood.

## 2012-06-09 NOTE — Patient Instructions (Addendum)
Blood Transfusion Information WHAT IS A BLOOD TRANSFUSION? A transfusion is the replacement of blood or some of its parts. Blood is made up of multiple cells which provide different functions.  Red blood cells carry oxygen and are used for blood loss replacement.  White blood cells fight against infection.  Platelets control bleeding.  Plasma helps clot blood.  Other blood products are available for specialized needs, such as hemophilia or other clotting disorders. BEFORE THE TRANSFUSION  Who gives blood for transfusions?   You may be able to donate blood to be used at a later date on yourself (autologous donation).  Relatives can be asked to donate blood. This is generally not any safer than if you have received blood from a stranger. The same precautions are taken to ensure safety when a relative's blood is donated.  Healthy volunteers who are fully evaluated to make sure their blood is safe. This is blood bank blood. Transfusion therapy is the safest it has ever been in the practice of medicine. Before blood is taken from a donor, a complete history is taken to make sure that person has no history of diseases nor engages in risky social behavior (examples are intravenous drug use or sexual activity with multiple partners). The donor's travel history is screened to minimize risk of transmitting infections, such as malaria. The donated blood is tested for signs of infectious diseases, such as HIV and hepatitis. The blood is then tested to be sure it is compatible with you in order to minimize the chance of a transfusion reaction. If you or a relative donates blood, this is often done in anticipation of surgery and is not appropriate for emergency situations. It takes many days to process the donated blood. RISKS AND COMPLICATIONS Although transfusion therapy is very safe and saves many lives, the main dangers of transfusion include:   Getting an infectious disease.  Developing a  transfusion reaction. This is an allergic reaction to something in the blood you were given. Every precaution is taken to prevent this. The decision to have a blood transfusion has been considered carefully by your caregiver before blood is given. Blood is not given unless the benefits outweigh the risks. AFTER THE TRANSFUSION  Right after receiving a blood transfusion, you will usually feel much better and more energetic. This is especially true if your red blood cells have gotten low (anemic). The transfusion raises the level of the red blood cells which carry oxygen, and this usually causes an energy increase.  The nurse administering the transfusion will monitor you carefully for complications. HOME CARE INSTRUCTIONS  No special instructions are needed after a transfusion. You may find your energy is better. Speak with your caregiver about any limitations on activity for underlying diseases you may have. SEEK MEDICAL CARE IF:   Your condition is not improving after your transfusion.  You develop redness or irritation at the intravenous (IV) site. SEEK IMMEDIATE MEDICAL CARE IF:  Any of the following symptoms occur over the next 12 hours:  Shaking chills.  You have a temperature by mouth above 102 F (38.9 C), not controlled by medicine.  Chest, back, or muscle pain.  People around you feel you are not acting correctly or are confused.  Shortness of breath or difficulty breathing.  Dizziness and fainting.  You get a rash or develop hives.  You have a decrease in urine output.  Your urine turns a dark color or changes to pink, red, or brown. Any of the following   symptoms occur over the next 10 days:  You have a temperature by mouth above 102 F (38.9 C), not controlled by medicine.  Shortness of breath.  Weakness after normal activity.  The white part of the eye turns yellow (jaundice).  You have a decrease in the amount of urine or are urinating less often.  Your  urine turns a dark color or changes to pink, red, or brown. Document Released: 04/12/2000 Document Revised: 07/08/2011 Document Reviewed: 11/30/2007 Exit[employer] Patient Information 2013 [employer].  

## 2012-06-10 LAB — TYPE AND SCREEN
Donor AG Type: NEGATIVE
Unit division: 0

## 2012-06-15 ENCOUNTER — Other Ambulatory Visit: Payer: Medicare Other | Admitting: Lab

## 2012-06-15 ENCOUNTER — Other Ambulatory Visit (HOSPITAL_BASED_OUTPATIENT_CLINIC_OR_DEPARTMENT_OTHER): Payer: Medicare Other

## 2012-06-15 ENCOUNTER — Ambulatory Visit: Payer: Medicare Other | Admitting: Adult Health

## 2012-06-15 DIAGNOSIS — C9200 Acute myeloblastic leukemia, not having achieved remission: Secondary | ICD-10-CM

## 2012-06-15 LAB — CBC WITH DIFFERENTIAL/PLATELET
BASO%: 0.9 % (ref 0.0–2.0)
EOS%: 4.3 % (ref 0.0–7.0)
HCT: 29.4 % — ABNORMAL LOW (ref 34.8–46.6)
LYMPH%: 17.6 % (ref 14.0–49.7)
MCH: 30.5 pg (ref 25.1–34.0)
MCHC: 34.2 g/dL (ref 31.5–36.0)
NEUT%: 76.5 % (ref 38.4–76.8)
Platelets: 34 10*3/uL — ABNORMAL LOW (ref 145–400)
lymph#: 1 10*3/uL (ref 0.9–3.3)

## 2012-06-16 ENCOUNTER — Telehealth: Payer: Self-pay | Admitting: Medical Oncology

## 2012-06-16 NOTE — Telephone Encounter (Signed)
Following up with patient regarding her question if she can monitor her blood sugar 3x per day instead of 4. Per MD, ok for patient to do to monitor 3x/day. Patient with no further questions. Knows to call with any questions or concerns.

## 2012-06-22 ENCOUNTER — Other Ambulatory Visit (HOSPITAL_BASED_OUTPATIENT_CLINIC_OR_DEPARTMENT_OTHER): Payer: Medicare Other | Admitting: Lab

## 2012-06-22 ENCOUNTER — Ambulatory Visit: Payer: Medicare Other | Admitting: Adult Health

## 2012-06-22 ENCOUNTER — Other Ambulatory Visit: Payer: Medicare Other | Admitting: Lab

## 2012-06-22 DIAGNOSIS — C9200 Acute myeloblastic leukemia, not having achieved remission: Secondary | ICD-10-CM

## 2012-06-22 LAB — CBC WITH DIFFERENTIAL/PLATELET
EOS%: 3.7 % (ref 0.0–7.0)
MCH: 30.7 pg (ref 25.1–34.0)
MCHC: 34.7 g/dL (ref 31.5–36.0)
MCV: 88.4 fL (ref 79.5–101.0)
MONO%: 0.5 % (ref 0.0–14.0)
RBC: 2.93 10*6/uL — ABNORMAL LOW (ref 3.70–5.45)
RDW: 13.2 % (ref 11.2–14.5)

## 2012-06-22 LAB — HOLD TUBE, BLOOD BANK

## 2012-06-29 ENCOUNTER — Encounter (HOSPITAL_COMMUNITY)
Admission: RE | Admit: 2012-06-29 | Discharge: 2012-06-29 | Disposition: A | Discharge status: Home / Self Care | Payer: Medicare Other | Source: Ambulatory Visit | Attending: Oncology | Admitting: Oncology

## 2012-06-29 ENCOUNTER — Other Ambulatory Visit (HOSPITAL_BASED_OUTPATIENT_CLINIC_OR_DEPARTMENT_OTHER): Payer: Medicare Other | Admitting: Lab

## 2012-06-29 ENCOUNTER — Encounter: Payer: Self-pay | Admitting: Adult Health

## 2012-06-29 ENCOUNTER — Ambulatory Visit (HOSPITAL_BASED_OUTPATIENT_CLINIC_OR_DEPARTMENT_OTHER): Payer: Medicare Other | Admitting: Adult Health

## 2012-06-29 VITALS — BP 137/66 | HR 76 | Temp 97.6°F | Resp 18 | Ht 64.0 in | Wt 101.7 lb

## 2012-06-29 DIAGNOSIS — C9200 Acute myeloblastic leukemia, not having achieved remission: Secondary | ICD-10-CM

## 2012-06-29 DIAGNOSIS — D649 Anemia, unspecified: Secondary | ICD-10-CM

## 2012-06-29 DIAGNOSIS — R32 Unspecified urinary incontinence: Secondary | ICD-10-CM

## 2012-06-29 LAB — PREPARE RBC (CROSSMATCH)

## 2012-06-29 LAB — CBC WITH DIFFERENTIAL/PLATELET
BASO%: 1 % (ref 0.0–2.0)
EOS%: 1.2 % (ref 0.0–7.0)
LYMPH%: 18.3 % (ref 14.0–49.7)
MCHC: 33.1 g/dL (ref 31.5–36.0)
MONO#: 0 10*3/uL — ABNORMAL LOW (ref 0.1–0.9)
RBC: 2.67 10*6/uL — ABNORMAL LOW (ref 3.70–5.45)
WBC: 7.3 10*3/uL (ref 3.9–10.3)
lymph#: 1.3 10*3/uL (ref 0.9–3.3)

## 2012-06-29 NOTE — Progress Notes (Signed)
OFFICE PROGRESS NOTE  CC  [name], MD [address] [place] [address]  DIAGNOSIS: 79 year old female with severe and profound anemia likely due to a primary bone marrow problem. 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  #2 patient has been on Procrit every 2 weeks unfortunately this has not effective and we will discontinue this.  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  #4 Patient received 3 cycles of Decitabine chemotherapy, her third cycle was complicated by febrile neutropenia and pneumonia causing hospitalization.    CURRENT THERAPY:Supportive care  INTERVAL HISTORY: Meredith Rodriguez 79 y.o. female returns for follow up today.  She is doing well today.  She is accompanied by her daughters who have a lot of questions regarding iron overload due to multiple transfusions, and when hospice will be necessary.  She is fatigued, but also c/o urge incontinence, particularly at night that has been occuring over the past month since her hospitalization.  She denies dysuria, hematuria, or any other concerns regarding her urinating, and has seen a urologist about this in the past.  She denies fevers, chills, nausea, vomiting, chest pain, shortness of breath or any other concerns.  A 10 point ROS is otherwise neg.   MEDICAL HISTORY: Past Medical History  Diagnosis Date  . Hypertension   . Diabetes mellitus   . Hyperkalemia   . IBS (irritable bowel syndrome)   . Hypercholesterolemia   . Cervical radiculopathy   . Anemia   . Cancer     leukemia    ALLERGIES:  is allergic to amoxicillin; cephalexin; metformin and related; phenergan; and procardia.  MEDICATIONS:  Current Outpatient Prescriptions  Medication Sig Dispense Refill  . amLODipine (NORVASC) 5 MG tablet Take 2.5 mg by mouth every morning.        . calcium gluconate 500 MG tablet Take 500 mg by mouth daily.      . cholecalciferol (VITAMIN D) 1000 UNITS tablet Take 1,000 Units by mouth daily.      Marland Kitchen glucose blood (FREESTYLE LITE) test strip Test blood sugar QID or as directed by MD.  100 each  11  . Insulin Pen Needle (B-D UF III MINI PEN NEEDLES) 31G X 5 MM MISC 100 Units by Does not apply route 4 (four) times daily.  200 each  6  . Lancets (FREESTYLE) lancets 1 each by Other route 4 (four) times daily. Use as instructed  200 each  12  . lidocaine-prilocaine (EMLA) cream Apply topically as needed.  30 g  4  . metoprolol (LOPRESSOR) 50 MG tablet Take 1 tablet (50 mg total) by mouth 2 (two) times daily.  180 tablet  3  . Omega-3 Fatty Acids (FISH OIL) 1000 MG CAPS Take 1,000 mg by mouth daily.       . pregabalin (LYRICA) 75 MG capsule Take 1 capsule (75 mg total) by mouth 2 (two) times daily.  60 capsule  0  . simvastatin (ZOCOR) 5 MG tablet Take 5 mg by mouth at bedtime.      Marland Kitchen LORazepam (ATIVAN) 0.5 MG tablet Take 1 tablet (0.5 mg total) by mouth every 6 (six) hours as needed for anxiety. Nausea  30 tablet  0  . [DISCONTINUED] glimepiride (AMARYL) 2 MG tablet Take 1 tablet (2 mg total) by mouth every morning.  30 tablet  11   Current  Facility-Administered Medications  Medication Dose Route Frequency Provider Last Rate Last Dose  . TDaP (BOOSTRIX) injection 0.5 mL  0.5 mL Intramuscular Once [name], MD        SURGICAL HISTORY:  Past Surgical History  Procedure Laterality Date  . Varicose veins      surgey 1959  . Tonsilectomy, adenoidectomy, bilateral myringotomy and tubes    . Esophagogastroduodenoscopy  04/01/2011    Procedure: ESOPHAGOGASTRODUODENOSCOPY (EGD);  Surgeon: Barrie Folk, MD;  Location: [facility] ENDOSCOPY;  Service: Endoscopy;  Laterality: N/A;    REVIEW OF SYSTEMS:   General: fatigue (+), night sweats (-), fever (-), pain (-) Lymph: palpable nodes (-) HEENT: vision changes (-), mucositis (-), gum  bleeding (-), epistaxis (-) Cardiovascular: chest pain (-), palpitations (-) Pulmonary: shortness of breath (-), dyspnea on exertion (-), cough (-), hemoptysis (-) GI:  Early satiety (-), melena (-), dysphagia (-), nausea/vomiting (-), diarrhea (-) GU: dysuria (-), hematuria (-), incontinence (-) Musculoskeletal: joint swelling (-), joint pain (-), back pain (-) Neuro: weakness (-), numbness (-), headache (-), confusion (-) Skin: Rash (-), lesions (-), dryness (-) Psych: depression (-), suicidal/homicidal ideation (-), feeling of hopelessness (-)   PHYSICAL EXAMINATION: BP 137/66  Pulse 76  Temp(Src) 97.6 F (36.4 C) (Oral)  Resp 18  Ht 5\' 4"  (1.626 m)  Wt 101 lb 11.2 oz (46.131 kg)  BMI 17.45 kg/m2 General: Patient is a well appearing female in no acute distress HEENT: PERRLA, sclerae anicteric no conjunctival pallor, MMM no erythema or exudate, right nare with large blood clot Neck: supple, no lymphadenopathy Lungs: clear to auscultation bilaterally, no wheezes, rhonchi, or rales Cardiovascular: regular rate rhythm, S1, S2, no murmurs, rubs or gallops Abdomen: Soft, non-tender, non-distended, normoactive bowel sounds, no HSM Extremities: warm and well perfused, no clubbing, cyanosis, or edema Skin: No rashes.    Neuro: Non-focal ECOG PERFORMANCE STATUS: 1  LABORATORY DATA: Lab Results  Component Value Date   WBC 7.3 06/29/2012   HGB 7.8* 06/29/2012   HCT 23.7* 06/29/2012   MCV 88.8 06/29/2012   PLT 94 06/29/2012      Chemistry      Component Value Date/Time   NA 137 06/01/2012 1028   NA 138 05/25/2012 0550   K 4.2 06/01/2012 1028   K 3.9 05/25/2012 0550   CL 99 06/01/2012 1028   CL 100 05/25/2012 0550   CO2 28 06/01/2012 1028   CO2 33* 05/25/2012 0550   BUN 17.5 06/01/2012 1028   BUN 16 05/25/2012 0550   CREATININE 0.7 06/01/2012 1028   CREATININE 0.54 05/25/2012 0550   CREATININE 0.69 07/18/2010 1202   GLU 215* 02/26/2012 1255      Component Value Date/Time   CALCIUM 9.2 06/01/2012  1028   CALCIUM 9.0 05/25/2012 0550   ALKPHOS 45 06/01/2012 1028   ALKPHOS 42 05/18/2012 0955   AST 29 06/01/2012 1028   AST 16 05/18/2012 0955   ALT 52 06/01/2012 1028   ALT 19 05/18/2012 0955   BILITOT 0.55 06/01/2012 1028   BILITOT 1.1 05/18/2012 0955       RADIOGRAPHIC STUDIES:  No results found.  ASSESSMENT: 79 year old female with:  1.  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  2. 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 This is discussed with the daughter and the patient today recommendations were made as below   #3 patient and I and her daughters  had an extensive discussion today about the likely transformation to acute leukemia. We discussed the process of chemotherapy, risks, benefits, and potential adverse effects of the drug.  Through much conversation between myself, the patient, and her children, the patient has decided to proceed with Decitabine chemotherapy.  Her chemotherapy has been discontinued due to her complication of febrile neutropenia and pneumonia.  Much more chemotherapy could take her life, therefore, we will continue with supportive care only.    #5 Anemia  PLAN:  #1 Ms. Dufner is here for her monthly visit.  She will need a PRBC transfusion which will be done tomorrow.  I discussed with her daughters regarding the fact that as we see a trend of increasing transfusions, and the length of time between transfusions, we will be getting closer to hospice.  I gave them information on iron chelation today.    #2 Ms. Depp will return tomorrow for a blood transfusion, weekly for labs, and on 3/31 for her next appt.   All questions were answered. The patient knows to call the clinic with any problems, questions or concerns. We can certainly see the patient much sooner if necessary.  I spent 25 minutes counseling the patient face to face. The total  time spent in the appointment was 30 minutes.  This case was reviewed with Dr. Welton Flakes.    Cherie Ouch Lyn Hollingshead, NP Medical Oncology [facility] Phone: [phone] 06/29/2012, 1:39 PM

## 2012-06-29 NOTE — Patient Instructions (Addendum)
Deferasirox tablets for oral suspension What is this medicine? DEFERASIROX (de FER a sir ox) binds to iron in the blood. It helps to prevent and treat too much iron in the blood caused by blood transfusions. This medicine may be used for other purposes; ask your health care provider or pharmacist if you have questions. What should I tell my health care provider before I take this medicine? They need to know if you have any of these conditions: -a blood disorder -cancer -hearing problems -kidney disease -liver disease -low blood counts, like low white cell, platelet, or red cell counts -vision problems -an unusual or allergic reaction to deferasirox, other medicines, foods, dyes, or preservatives -pregnant or trying to get pregnant -breast-feeding How should I use this medicine? Take this medicine by mouth. Do not chew or take tablets whole. Follow the directions on the prescription label. Take this medicine on an empty stomach, at least 30 minutes before food. Do not take with food. Before taking, mix the dose in water, orange juice, or apple juice as directed. If your dose is less than 1 gram, use 3.5 ounces or about 1/2 glass of liquid. If your dose is 1 gram or more, use 7 ounces or about 1 full glass of liquid. Stir the medicine into the liquid until the tablets dissolve. Drink your dose right away after mixing. If there is any medicine left in the glass after you drink the mixture, add a little bit more liquid, swirl it around, and drink. Take your doses at regular intervals. Do not take your medicine more often than directed. Talk to your pediatrician regarding the use of this medicine in children. While this medicine may be prescribed for children as young as 55 years of age for selected conditions, precautions do apply. Overdosage: If you think you have taken too much of this medicine contact a poison control center or emergency room at once. NOTE: This medicine is only for you. Do not  share this medicine with others. What if I miss a dose? If you miss a dose, take it as soon as you can. If it is almost time for your next dose, take only that dose. Do not take double or extra doses. What may interact with this medicine? -antacids that have aluminum -cholestyramine -iron supplements or vitamins that have iron -other iron binders like deferoxamine -phenobarbital -phenytoin -rifampin -ritonavir -vitamin C This list may not describe all possible interactions. Give your health care provider a list of all the medicines, herbs, non-prescription drugs, or dietary supplements you use. Also tell them if you smoke, drink alcohol, or use illegal drugs. Some items may interact with your medicine. What should I watch for while using this medicine? Visit your doctor for regular check ups. You will need important blood work, vision tests, and hearing tests done while you are taking this medicine. You may get drowsy or dizzy. Do not drive, use machinery, or do anything that needs mental alertness until you know how this medicine affects you. Do not stand or sit up quickly, especially if you are an older patient. This reduces the risk of dizzy or fainting spells. Avoid taking antacids containing aluminum at the same time as this medicine. What side effects may I notice from receiving this medicine? Side effects that you should report to your doctor or health care professional as soon as possible: -allergic reactions like skin rash, itching or hives, swelling of the face, lips, or tongue -black or bloody stools or blood in  vomit -changes in hearing -changes in vision -dark urine -fever or chills, sore throat -general ill feeling or flu-like symptoms -loss of appetite, nausea -right upper belly pain -trouble passing urine or change in the amount of urine -unusual bleeding or bruising -unusually weak or tired -yellowing of the eyes or skin Side effects that usually do not require  medical attention (report to your doctor or health care professional if they continue or are bothersome): -cough -diarrhea -headache -nausea, vomiting -stomach pain -trouble sleeping This list may not describe all possible side effects. Call your doctor for medical advice about side effects. You may report side effects to FDA at 1-800-FDA-1088. Where should I keep my medicine? Keep out of the reach of children. Store at room temperature between 15 and 30 degrees C (59 and 86 degrees F). Protect from moisture. Throw away any unused medicine after the expiration date. NOTE: This sheet is a summary. It may not cover all possible information. If you have questions about this medicine, talk to your doctor, pharmacist, or health care provider.  2013, [employer]. (06/10/2008 7:17:38 AM)

## 2012-06-30 ENCOUNTER — Other Ambulatory Visit: Payer: Self-pay | Admitting: Adult Health

## 2012-06-30 ENCOUNTER — Ambulatory Visit (HOSPITAL_BASED_OUTPATIENT_CLINIC_OR_DEPARTMENT_OTHER): Payer: Medicare Other

## 2012-06-30 VITALS — BP 146/54 | HR 78 | Temp 98.1°F | Resp 20

## 2012-06-30 DIAGNOSIS — D649 Anemia, unspecified: Secondary | ICD-10-CM

## 2012-06-30 DIAGNOSIS — E8770 Fluid overload, unspecified: Secondary | ICD-10-CM

## 2012-06-30 LAB — HOLD TUBE, BLOOD BANK

## 2012-06-30 MED ORDER — ACETAMINOPHEN 325 MG PO TABS
650.0000 mg | ORAL_TABLET | Freq: Once | ORAL | Status: AC
  Administered 2012-06-30: 650 mg via ORAL

## 2012-06-30 MED ORDER — DIPHENHYDRAMINE HCL 25 MG PO CAPS
25.0000 mg | ORAL_CAPSULE | Freq: Once | ORAL | Status: AC
  Administered 2012-06-30: 25 mg via ORAL

## 2012-06-30 MED ORDER — SODIUM CHLORIDE 0.9 % IJ SOLN
10.0000 mL | INTRAMUSCULAR | Status: AC | PRN
  Administered 2012-06-30: 10 mL
  Filled 2012-06-30: qty 10

## 2012-06-30 MED ORDER — SODIUM CHLORIDE 0.9 % IV SOLN
250.0000 mL | Freq: Once | INTRAVENOUS | Status: AC
  Administered 2012-06-30: 250 mL via INTRAVENOUS

## 2012-06-30 MED ORDER — FUROSEMIDE 10 MG/ML IJ SOLN
20.0000 mg | Freq: Once | INTRAMUSCULAR | Status: AC
  Administered 2012-06-30: 20 mg via INTRAVENOUS

## 2012-06-30 MED ORDER — HEPARIN SOD (PORK) LOCK FLUSH 100 UNIT/ML IV SOLN
500.0000 [IU] | Freq: Every day | INTRAVENOUS | Status: AC | PRN
  Administered 2012-06-30: 500 [IU]
  Filled 2012-06-30: qty 5

## 2012-06-30 NOTE — Patient Instructions (Addendum)
Blood Transfusion Information WHAT IS A BLOOD TRANSFUSION? A transfusion is the replacement of blood or some of its parts. Blood is made up of multiple cells which provide different functions.  Red blood cells carry oxygen and are used for blood loss replacement.  White blood cells fight against infection.  Platelets control bleeding.  Plasma helps clot blood.  Other blood products are available for specialized needs, such as hemophilia or other clotting disorders. BEFORE THE TRANSFUSION  Who gives blood for transfusions?   You may be able to donate blood to be used at a later date on yourself (autologous donation).  Relatives can be asked to donate blood. This is generally not any safer than if you have received blood from a stranger. The same precautions are taken to ensure safety when a relative's blood is donated.  Healthy volunteers who are fully evaluated to make sure their blood is safe. This is blood bank blood. Transfusion therapy is the safest it has ever been in the practice of medicine. Before blood is taken from a donor, a complete history is taken to make sure that person has no history of diseases nor engages in risky social behavior (examples are intravenous drug use or sexual activity with multiple partners). The donor's travel history is screened to minimize risk of transmitting infections, such as malaria. The donated blood is tested for signs of infectious diseases, such as HIV and hepatitis. The blood is then tested to be sure it is compatible with you in order to minimize the chance of a transfusion reaction. If you or a relative donates blood, this is often done in anticipation of surgery and is not appropriate for emergency situations. It takes many days to process the donated blood. RISKS AND COMPLICATIONS Although transfusion therapy is very safe and saves many lives, the main dangers of transfusion include:   Getting an infectious disease.  Developing a  transfusion reaction. This is an allergic reaction to something in the blood you were given. Every precaution is taken to prevent this. The decision to have a blood transfusion has been considered carefully by your caregiver before blood is given. Blood is not given unless the benefits outweigh the risks. AFTER THE TRANSFUSION  Right after receiving a blood transfusion, you will usually feel much better and more energetic. This is especially true if your red blood cells have gotten low (anemic). The transfusion raises the level of the red blood cells which carry oxygen, and this usually causes an energy increase.  The nurse administering the transfusion will monitor you carefully for complications. HOME CARE INSTRUCTIONS  No special instructions are needed after a transfusion. You may find your energy is better. Speak with your caregiver about any limitations on activity for underlying diseases you may have. SEEK MEDICAL CARE IF:   Your condition is not improving after your transfusion.  You develop redness or irritation at the intravenous (IV) site. SEEK IMMEDIATE MEDICAL CARE IF:  Any of the following symptoms occur over the next 12 hours:  Shaking chills.  You have a temperature by mouth above 102 F (38.9 C), not controlled by medicine.  Chest, back, or muscle pain.  People around you feel you are not acting correctly or are confused.  Shortness of breath or difficulty breathing.  Dizziness and fainting.  You get a rash or develop hives.  You have a decrease in urine output.  Your urine turns a dark color or changes to pink, red, or brown. Any of the following   symptoms occur over the next 10 days:  You have a temperature by mouth above 102 F (38.9 C), not controlled by medicine.  Shortness of breath.  Weakness after normal activity.  The white part of the eye turns yellow (jaundice).  You have a decrease in the amount of urine or are urinating less often.  Your  urine turns a dark color or changes to pink, red, or brown. Document Released: 04/12/2000 Document Revised: 07/08/2011 Document Reviewed: 11/30/2007 Exit[employer] Patient Information 2013 [employer].  

## 2012-06-30 NOTE — Progress Notes (Signed)
Pt BP 129/48 prior to start of blood.  156/60 end of 1st union.  172/51 at 15 minute check on 2nd unit.  Notified Candida Peeling - NP.  20 Lasix ordered and administered.

## 2012-07-01 LAB — TYPE AND SCREEN
ABO/RH(D): AB NEG
Donor AG Type: NEGATIVE
Donor AG Type: NEGATIVE

## 2012-07-06 ENCOUNTER — Other Ambulatory Visit (HOSPITAL_BASED_OUTPATIENT_CLINIC_OR_DEPARTMENT_OTHER): Payer: Medicare Other | Admitting: Lab

## 2012-07-06 DIAGNOSIS — R32 Unspecified urinary incontinence: Secondary | ICD-10-CM

## 2012-07-06 DIAGNOSIS — C9200 Acute myeloblastic leukemia, not having achieved remission: Secondary | ICD-10-CM

## 2012-07-06 LAB — URINALYSIS, MICROSCOPIC - CHCC
Blood: NEGATIVE
Glucose: NEGATIVE mg/dL
Nitrite: NEGATIVE
Urobilinogen, UR: 0.2 mg/dL (ref 0.2–1)
pH: 6.5 (ref 4.6–8.0)

## 2012-07-06 LAB — CBC WITH DIFFERENTIAL/PLATELET
Basophils Absolute: 0.1 10*3/uL (ref 0.0–0.1)
Eosinophils Absolute: 0.1 10*3/uL (ref 0.0–0.5)
HCT: 31.8 % — ABNORMAL LOW (ref 34.8–46.6)
LYMPH%: 17 % (ref 14.0–49.7)
MONO#: 0.1 10*3/uL (ref 0.1–0.9)
NEUT#: 6.4 10*3/uL (ref 1.5–6.5)
NEUT%: 80.5 % — ABNORMAL HIGH (ref 38.4–76.8)
Platelets: 90 10*3/uL — ABNORMAL LOW (ref 145–400)
WBC: 7.9 10*3/uL (ref 3.9–10.3)

## 2012-07-13 ENCOUNTER — Other Ambulatory Visit (HOSPITAL_BASED_OUTPATIENT_CLINIC_OR_DEPARTMENT_OTHER): Payer: Medicare Other | Admitting: Lab

## 2012-07-13 DIAGNOSIS — C9200 Acute myeloblastic leukemia, not having achieved remission: Secondary | ICD-10-CM

## 2012-07-13 LAB — CBC WITH DIFFERENTIAL/PLATELET
Basophils Absolute: 0.1 10*3/uL (ref 0.0–0.1)
EOS%: 0.6 % (ref 0.0–7.0)
HCT: 27.8 % — ABNORMAL LOW (ref 34.8–46.6)
HGB: 9.3 g/dL — ABNORMAL LOW (ref 11.6–15.9)
MCH: 29.4 pg (ref 25.1–34.0)
MCV: 87.6 fL (ref 79.5–101.0)
MONO%: 0.4 % (ref 0.0–14.0)
NEUT%: 82.3 % — ABNORMAL HIGH (ref 38.4–76.8)
lymph#: 1.5 10*3/uL (ref 0.9–3.3)

## 2012-07-20 ENCOUNTER — Ambulatory Visit (HOSPITAL_BASED_OUTPATIENT_CLINIC_OR_DEPARTMENT_OTHER): Payer: Medicare Other

## 2012-07-20 ENCOUNTER — Other Ambulatory Visit: Payer: Self-pay | Admitting: Adult Health

## 2012-07-20 ENCOUNTER — Other Ambulatory Visit: Payer: Self-pay | Admitting: Medical Oncology

## 2012-07-20 ENCOUNTER — Other Ambulatory Visit (HOSPITAL_BASED_OUTPATIENT_CLINIC_OR_DEPARTMENT_OTHER): Payer: Medicare Other | Admitting: Lab

## 2012-07-20 VITALS — BP 154/47 | HR 75 | Temp 98.2°F | Resp 16

## 2012-07-20 DIAGNOSIS — C9200 Acute myeloblastic leukemia, not having achieved remission: Secondary | ICD-10-CM

## 2012-07-20 DIAGNOSIS — D649 Anemia, unspecified: Secondary | ICD-10-CM

## 2012-07-20 LAB — CBC WITH DIFFERENTIAL/PLATELET
Eosinophils Absolute: 0.1 10*3/uL (ref 0.0–0.5)
LYMPH%: 10.5 % — ABNORMAL LOW (ref 14.0–49.7)
MCV: 88.1 fL (ref 79.5–101.0)
MONO%: 0.3 % (ref 0.0–14.0)
NEUT#: 10.1 10*3/uL — ABNORMAL HIGH (ref 1.5–6.5)
NEUT%: 87.7 % — ABNORMAL HIGH (ref 38.4–76.8)
Platelets: 153 10*3/uL (ref 145–400)
RBC: 2.68 10*6/uL — ABNORMAL LOW (ref 3.70–5.45)

## 2012-07-20 LAB — PREPARE RBC (CROSSMATCH)

## 2012-07-20 LAB — HOLD TUBE, BLOOD BANK

## 2012-07-20 MED ORDER — ACETAMINOPHEN 325 MG PO TABS
650.0000 mg | ORAL_TABLET | Freq: Once | ORAL | Status: AC
  Administered 2012-07-20: 650 mg via ORAL

## 2012-07-20 MED ORDER — SODIUM CHLORIDE 0.9 % IJ SOLN
10.0000 mL | INTRAMUSCULAR | Status: AC | PRN
  Administered 2012-07-20: 10 mL
  Filled 2012-07-20: qty 10

## 2012-07-20 MED ORDER — SODIUM CHLORIDE 0.9 % IV SOLN
250.0000 mL | Freq: Once | INTRAVENOUS | Status: AC
  Administered 2012-07-20: 250 mL via INTRAVENOUS

## 2012-07-20 MED ORDER — HEPARIN SOD (PORK) LOCK FLUSH 100 UNIT/ML IV SOLN
500.0000 [IU] | Freq: Every day | INTRAVENOUS | Status: AC | PRN
  Administered 2012-07-20: 500 [IU]
  Filled 2012-07-20: qty 5

## 2012-07-20 MED ORDER — DIPHENHYDRAMINE HCL 25 MG PO CAPS
25.0000 mg | ORAL_CAPSULE | Freq: Once | ORAL | Status: AC
  Administered 2012-07-20: 25 mg via ORAL

## 2012-07-20 NOTE — Patient Instructions (Addendum)
Blood Transfusion Information WHAT IS A BLOOD TRANSFUSION? A transfusion is the replacement of blood or some of its parts. Blood is made up of multiple cells which provide different functions.  Red blood cells carry oxygen and are used for blood loss replacement.  White blood cells fight against infection.  Platelets control bleeding.  Plasma helps clot blood.  Other blood products are available for specialized needs, such as hemophilia or other clotting disorders. BEFORE THE TRANSFUSION  Who gives blood for transfusions?   You may be able to donate blood to be used at a later date on yourself (autologous donation).  Relatives can be asked to donate blood. This is generally not any safer than if you have received blood from a stranger. The same precautions are taken to ensure safety when a relative's blood is donated.  Healthy volunteers who are fully evaluated to make sure their blood is safe. This is blood bank blood. Transfusion therapy is the safest it has ever been in the practice of medicine. Before blood is taken from a donor, a complete history is taken to make sure that person has no history of diseases nor engages in risky social behavior (examples are intravenous drug use or sexual activity with multiple partners). The donor's travel history is screened to minimize risk of transmitting infections, such as malaria. The donated blood is tested for signs of infectious diseases, such as HIV and hepatitis. The blood is then tested to be sure it is compatible with you in order to minimize the chance of a transfusion reaction. If you or a relative donates blood, this is often done in anticipation of surgery and is not appropriate for emergency situations. It takes many days to process the donated blood. RISKS AND COMPLICATIONS Although transfusion therapy is very safe and saves many lives, the main dangers of transfusion include:   Getting an infectious disease.  Developing a  transfusion reaction. This is an allergic reaction to something in the blood you were given. Every precaution is taken to prevent this. The decision to have a blood transfusion has been considered carefully by your caregiver before blood is given. Blood is not given unless the benefits outweigh the risks. AFTER THE TRANSFUSION  Right after receiving a blood transfusion, you will usually feel much better and more energetic. This is especially true if your red blood cells have gotten low (anemic). The transfusion raises the level of the red blood cells which carry oxygen, and this usually causes an energy increase.  The nurse administering the transfusion will monitor you carefully for complications. HOME CARE INSTRUCTIONS  No special instructions are needed after a transfusion. You may find your energy is better. Speak with your caregiver about any limitations on activity for underlying diseases you may have. SEEK MEDICAL CARE IF:   Your condition is not improving after your transfusion.  You develop redness or irritation at the intravenous (IV) site. SEEK IMMEDIATE MEDICAL CARE IF:  Any of the following symptoms occur over the next 12 hours:  Shaking chills.  You have a temperature by mouth above 102 F (38.9 C), not controlled by medicine.  Chest, back, or muscle pain.  People around you feel you are not acting correctly or are confused.  Shortness of breath or difficulty breathing.  Dizziness and fainting.  You get a rash or develop hives.  You have a decrease in urine output.  Your urine turns a dark color or changes to pink, red, or brown. Any of the following   symptoms occur over the next 10 days:  You have a temperature by mouth above 102 F (38.9 C), not controlled by medicine.  Shortness of breath.  Weakness after normal activity.  The white part of the eye turns yellow (jaundice).  You have a decrease in the amount of urine or are urinating less often.  Your  urine turns a dark color or changes to pink, red, or brown. Document Released: 04/12/2000 Document Revised: 07/08/2011 Document Reviewed: 11/30/2007 Exit[employer] Patient Information 2013 [employer].  

## 2012-07-21 LAB — TYPE AND SCREEN: Donor AG Type: NEGATIVE

## 2012-07-27 ENCOUNTER — Telehealth: Payer: Self-pay | Admitting: Oncology

## 2012-07-27 ENCOUNTER — Ambulatory Visit (HOSPITAL_BASED_OUTPATIENT_CLINIC_OR_DEPARTMENT_OTHER): Payer: Medicare Other | Admitting: Adult Health

## 2012-07-27 ENCOUNTER — Encounter: Payer: Self-pay | Admitting: Oncology

## 2012-07-27 ENCOUNTER — Other Ambulatory Visit (HOSPITAL_BASED_OUTPATIENT_CLINIC_OR_DEPARTMENT_OTHER): Payer: Medicare Other | Admitting: Lab

## 2012-07-27 VITALS — BP 134/68 | HR 78 | Temp 98.0°F | Resp 20 | Ht 64.0 in | Wt 102.9 lb

## 2012-07-27 DIAGNOSIS — C9200 Acute myeloblastic leukemia, not having achieved remission: Secondary | ICD-10-CM

## 2012-07-27 DIAGNOSIS — D4620 Refractory anemia with excess of blasts, unspecified: Secondary | ICD-10-CM

## 2012-07-27 DIAGNOSIS — D649 Anemia, unspecified: Secondary | ICD-10-CM

## 2012-07-27 LAB — CBC WITH DIFFERENTIAL/PLATELET
Basophils Absolute: 0.1 10*3/uL (ref 0.0–0.1)
EOS%: 0.4 % (ref 0.0–7.0)
Eosinophils Absolute: 0 10*3/uL (ref 0.0–0.5)
HGB: 10.7 g/dL — ABNORMAL LOW (ref 11.6–15.9)
MCH: 28.7 pg (ref 25.1–34.0)
NEUT#: 11.1 10*3/uL — ABNORMAL HIGH (ref 1.5–6.5)
RBC: 3.72 10*6/uL (ref 3.70–5.45)
RDW: 13.9 % (ref 11.2–14.5)
lymph#: 0.8 10*3/uL — ABNORMAL LOW (ref 0.9–3.3)

## 2012-07-27 LAB — HOLD TUBE, BLOOD BANK

## 2012-07-27 NOTE — Patient Instructions (Addendum)
Doing well.  Continue to do lab appointments and we will see you next month.

## 2012-07-27 NOTE — Telephone Encounter (Signed)
gv pt appt schedule for April and May. °

## 2012-07-27 NOTE — Progress Notes (Signed)
OFFICE PROGRESS NOTE  CC  Meredith Covey, MD [address] [place] [address]  DIAGNOSIS: 79 year old female with severe and profound anemia likely due to a primary bone marrow problem. 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  #2 patient has been on Procrit every 2 weeks unfortunately this has not effective and we will discontinue this.  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  #4 Patient received 3 cycles of Decitabine chemotherapy, her third cycle was complicated by febrile neutropenia and pneumonia causing hospitalization.    CURRENT THERAPY:Supportive care  INTERVAL HISTORY: Meredith Rodriguez 79 y.o. female returns for follow up today. Doing well today.  She is having night sweats, but no other problems with fevers, chills, nausea, vomiting, constipation, weight loss, dizziness, or any other concerns.    MEDICAL HISTORY: Past Medical History  Diagnosis Date  . Hypertension   . Diabetes mellitus   . Hyperkalemia   . IBS (irritable bowel syndrome)   . Hypercholesterolemia   . Cervical radiculopathy   . Anemia   . Cancer     leukemia    ALLERGIES:  is allergic to amoxicillin; cephalexin; metformin and related; phenergan; and procardia.  MEDICATIONS:  Current Outpatient Prescriptions  Medication Sig Dispense Refill  . amLODipine (NORVASC) 5 MG tablet Take 2.5 mg by mouth every morning.       . calcium gluconate 500 MG tablet Take 500 mg by mouth daily.      . cholecalciferol (VITAMIN D) 1000 UNITS tablet Take 1,000 Units by mouth daily.      Marland Kitchen glucose blood (FREESTYLE LITE) test strip Test blood sugar QID or as directed by MD.  100 each  11  . Insulin Pen Needle (B-D UF III MINI PEN NEEDLES) 31G X 5 MM MISC 100 Units by Does not apply route 4 (four) times daily.  200 each  6  . Lancets  (FREESTYLE) lancets 1 each by Other route 4 (four) times daily. Use as instructed  200 each  12  . lidocaine-prilocaine (EMLA) cream Apply topically as needed.  30 g  4  . metoprolol (LOPRESSOR) 50 MG tablet Take 1 tablet (50 mg total) by mouth 2 (two) times daily.  180 tablet  3  . Omega-3 Fatty Acids (FISH OIL) 1000 MG CAPS Take 1,000 mg by mouth daily.       . pregabalin (LYRICA) 75 MG capsule Take 1 capsule (75 mg total) by mouth 2 (two) times daily.  60 capsule  0  . simvastatin (ZOCOR) 5 MG tablet Take 5 mg by mouth at bedtime.      Marland Kitchen LORazepam (ATIVAN) 0.5 MG tablet Take 1 tablet (0.5 mg total) by mouth every 6 (six) hours as needed for anxiety. Nausea  30 tablet  0  . [DISCONTINUED] glimepiride (AMARYL) 2 MG tablet Take 1 tablet (2 mg total) by mouth every morning.  30 tablet  11   Current Facility-Administered Medications  Medication Dose Route Frequency Provider Last Rate Last Dose  . TDaP (BOOSTRIX) injection 0.5 mL  0.5 mL Intramuscular Once Meredith Covey, MD        SURGICAL HISTORY:  Past Surgical History  Procedure Laterality Date  . Varicose veins      surgey 1959  . Tonsilectomy, adenoidectomy, bilateral myringotomy and tubes    . Esophagogastroduodenoscopy  04/01/2011  Procedure: ESOPHAGOGASTRODUODENOSCOPY (EGD);  Surgeon: Barrie Folk, MD;  Location[facility] ENDOSCOPY;  Service: Endoscopy;  Laterality: N/A;    REVIEW OF SYSTEMS:   General: fatigue (+), night sweats (-), fever (-), pain (-) Lymph: palpable nodes (-) HEENT: vision changes (-), mucositis (-), gum bleeding (-), epistaxis (-) Cardiovascular: chest pain (-), palpitations (-) Pulmonary: shortness of breath (-), dyspnea on exertion (-), cough (-), hemoptysis (-) GI:  Early satiety (-), melena (-), dysphagia (-), nausea/vomiting (-), diarrhea (-) GU: dysuria (-), hematuria (-), incontinence (-) Musculoskeletal: joint swelling (-), joint pain (-), back pain (-) Neuro: weakness (-), numbness (-), headache (-),  confusion (-) Skin: Rash (-), lesions (-), dryness (-) Psych: depression (-), suicidal/homicidal ideation (-), feeling of hopelessness (-)   PHYSICAL EXAMINATION: BP 134/68  Pulse 78  Temp(Src) 98 F (36.7 C) (Oral)  Resp 20  Ht 5\' 4"  (1.626 m)  Wt 102 lb 14.4 oz (46.675 kg)  BMI 17.65 kg/m2 General: Patient is a well appearing female in no acute distress HEENT: PERRLA, sclerae anicteric no conjunctival pallor, MMM no erythema or exudate, right nare with large blood clot Neck: supple, no lymphadenopathy Lungs: clear to auscultation bilaterally, no wheezes, rhonchi, or rales Cardiovascular: regular rate rhythm, S1, S2, no murmurs, rubs or gallops Abdomen: Soft, non-tender, non-distended, normoactive bowel sounds, no HSM Extremities: warm and well perfused, no clubbing, cyanosis, or edema Skin: No rashes.    Neuro: Non-focal ECOG PERFORMANCE STATUS: 1  LABORATORY DATA: Lab Results  Component Value Date   WBC 12.0* 07/27/2012   HGB 10.7* 07/27/2012   HCT 32.9* 07/27/2012   MCV 88.5 07/27/2012   PLT 137* 07/27/2012      Chemistry      Component Value Date/Time   NA 137 06/01/2012 1028   NA 138 05/25/2012 0550   K 4.2 06/01/2012 1028   K 3.9 05/25/2012 0550   CL 99 06/01/2012 1028   CL 100 05/25/2012 0550   CO2 28 06/01/2012 1028   CO2 33* 05/25/2012 0550   BUN 17.5 06/01/2012 1028   BUN 16 05/25/2012 0550   CREATININE 0.7 06/01/2012 1028   CREATININE 0.54 05/25/2012 0550   CREATININE 0.69 07/18/2010 1202   GLU 215* 02/26/2012 1255      Component Value Date/Time   CALCIUM 9.2 06/01/2012 1028   CALCIUM 9.0 05/25/2012 0550   ALKPHOS 45 06/01/2012 1028   ALKPHOS 42 05/18/2012 0955   AST 29 06/01/2012 1028   AST 16 05/18/2012 0955   ALT 52 06/01/2012 1028   ALT 19 05/18/2012 0955   BILITOT 0.55 06/01/2012 1028   BILITOT 1.1 05/18/2012 0955       RADIOGRAPHIC STUDIES:  No results found.  ASSESSMENT: 79 year old female with:  1.  severe anemia due to primary refractory anemia Patient is  receiving supportive therapy with blood transfusions.  2. 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 This is discussed with the daughter and the patient today recommendations were made as below   #3 patient and I and her daughters had an extensive discussion today about the likely transformation to acute leukemia. We discussed the process of chemotherapy, risks, benefits, and potential adverse effects of the drug.  Through much conversation between myself, the patient, and her children, the patient has decided to proceed with Decitabine chemotherapy.  Her chemotherapy has been discontinued due to her complication of febrile neutropenia and pneumonia.  Much more chemotherapy could take her life, therefore, we will continue  with supportive care only.    #5 Anemia  PLAN:  #1 Ms. Swint is here for her monthly visit.  Her labs have done well this months.  She has averaged needing blood transfusions approximately every 3 weeks.  She has tolerated these well.  Her WBC is slightly elevated at 12 today.  Her hgb is stable, she doesn't require transfusions, and her plts are stable as well.  We did discuss that we could see a trend for the WBC to increase.    #2 Ms. Salvas will return for weekly lab visits, and I will see her back in 4 weeks.    All questions were answered. The patient knows to call the clinic with any problems, questions or concerns. We can certainly see the patient much sooner if necessary.  I spent 25 minutes counseling the patient face to face. The total time spent in the appointment was 30 minutes.  This case was reviewed with Dr. Welton Flakes.    Cherie Ouch Lyn Hollingshead, NP Medical Oncology [facility] Phone: [phone] 07/27/2012, 12:58 PM

## 2012-07-30 ENCOUNTER — Encounter: Payer: Self-pay | Admitting: Family Medicine

## 2012-07-30 ENCOUNTER — Ambulatory Visit (INDEPENDENT_AMBULATORY_CARE_PROVIDER_SITE_OTHER): Payer: Medicare Other | Admitting: Family Medicine

## 2012-07-30 ENCOUNTER — Telehealth: Payer: Self-pay | Admitting: Family Medicine

## 2012-07-30 VITALS — BP 130/58 | Temp 98.2°F | Wt 104.0 lb

## 2012-07-30 DIAGNOSIS — L309 Dermatitis, unspecified: Secondary | ICD-10-CM

## 2012-07-30 DIAGNOSIS — E119 Type 2 diabetes mellitus without complications: Secondary | ICD-10-CM

## 2012-07-30 DIAGNOSIS — L259 Unspecified contact dermatitis, unspecified cause: Secondary | ICD-10-CM

## 2012-07-30 DIAGNOSIS — H6120 Impacted cerumen, unspecified ear: Secondary | ICD-10-CM

## 2012-07-30 DIAGNOSIS — H6123 Impacted cerumen, bilateral: Secondary | ICD-10-CM

## 2012-07-30 LAB — HEMOGLOBIN A1C: Hgb A1c MFr Bld: 8.9 % — ABNORMAL HIGH (ref 4.6–6.5)

## 2012-07-30 MED ORDER — MOMETASONE FUROATE 0.1 % EX SOLN: Freq: Every day | CUTANEOUS | Status: DC

## 2012-07-30 NOTE — Progress Notes (Signed)
  Subjective:    Patient ID: Meredith Rodriguez, female    DOB: 1926/01/31, 79 y.o.   MRN: [id number]  HPI Patient seen for several items as follows  Bilateral cerumen impactions. She wears hearing aids. She's had similar problem in the past. Bilateral ear fullness. No drainage. No ear pain.  Patient has history of type 2 diabetes. Last A1c 7.8% this was several months ago. Patient reportedly had elevation of blood sugars following chemotherapy and apparently is taking sliding scale insulin 3 times daily. We're not sure which insulin she is actually taking nor do they have a list of her sliding scale.  Pruritic rash outer ear canal right greater than left intermittently. No alleviating factors. She has not tried any prescription medications  Patient has chronic problems including hypertension, type 2 diabetes, hyperlipidemia, chronic thrombocytopenia and pancytopenia with acute myeloid leukemia. Follow closely by hematology.  Past Medical History  Diagnosis Date  . Hypertension   . Diabetes mellitus   . Hyperkalemia   . IBS (irritable bowel syndrome)   . Hypercholesterolemia   . Cervical radiculopathy   . Anemia   . Cancer     leukemia   Past Surgical History  Procedure Laterality Date  . Varicose veins      surgey 1959  . Tonsilectomy, adenoidectomy, bilateral myringotomy and tubes    . Esophagogastroduodenoscopy  04/01/2011    Procedure: ESOPHAGOGASTRODUODENOSCOPY (EGD);  Surgeon: Barrie Folk, MD;  Location: [facility] ENDOSCOPY;  Service: Endoscopy;  Laterality: N/A;    reports that she has never smoked. She does not have any smokeless tobacco history on file. She reports that she does not drink alcohol or use illicit drugs. family history includes Cancer in her daughter and sisters; Diabetes in her sister; Hypertension in her father; and Stroke in her mother. Allergies  Allergen Reactions  . Amoxicillin (Amoxicillin)     Upset stomach  . Cephalexin Nausea Only  . Metformin And Related      Gi problems  . Phenergan (Promethazine Hcl) Other (See Comments)    Unknown   . Procardia (Nifedipine)     dizziness      Review of Systems  Constitutional: Negative for fever, chills and unexpected weight change.  HENT: Negative for ear pain, tinnitus and ear discharge.   Respiratory: Negative for cough and shortness of breath.   Cardiovascular: Negative for chest pain.  Skin: Positive for rash.       Objective:   Physical Exam  Constitutional: She appears well-developed and well-nourished.  HENT:  Bilateral cerumen impactions. Removed with irrigation. She has some eczematous rash right external canal with mild scaling and minimal erythema.  Neck: Neck supple. No thyromegaly present.  Cardiovascular: Normal rate and regular rhythm.   Pulmonary/Chest: Effort normal and breath sounds normal. No respiratory distress. She has no wheezes. She has no rales.  Musculoskeletal: She exhibits no edema.  Lymphadenopathy:    She has no cervical adenopathy.  Neurological: She is alert.          Assessment & Plan:  #1 bilateral cerumen impactions. Removed with irrigation #2 eczema right external ear canal. Elocon lotion twice daily as needed  #3 type 2 diabetes. No recent A1c. Check A1c. Will call back with her current insulin. Patient asking for consideration of looking at more simple regimen possibly once daily insulin

## 2012-07-30 NOTE — Telephone Encounter (Signed)
Per daughter pt is on insulin pens humalog. Pt was seen today

## 2012-07-30 NOTE — Progress Notes (Signed)
Quick Note:  Pt reports averaging 2-3 units with sliding scale of 151-200 and 201-250 blood sugars before meals. Since chemo she does not give much more than that as her BS have improved. ______

## 2012-07-31 ENCOUNTER — Other Ambulatory Visit: Payer: Self-pay | Admitting: *Deleted

## 2012-08-03 ENCOUNTER — Other Ambulatory Visit (HOSPITAL_BASED_OUTPATIENT_CLINIC_OR_DEPARTMENT_OTHER): Payer: Medicare Other

## 2012-08-03 ENCOUNTER — Telehealth: Payer: Self-pay | Admitting: Family Medicine

## 2012-08-03 DIAGNOSIS — D649 Anemia, unspecified: Secondary | ICD-10-CM

## 2012-08-03 DIAGNOSIS — C9200 Acute myeloblastic leukemia, not having achieved remission: Secondary | ICD-10-CM

## 2012-08-03 LAB — CBC WITH DIFFERENTIAL/PLATELET
BASO%: 0.9 % (ref 0.0–2.0)
EOS%: 0.6 % (ref 0.0–7.0)
HCT: 28.4 % — ABNORMAL LOW (ref 34.8–46.6)
MCHC: 32.6 g/dL (ref 31.5–36.0)
MONO#: 0 10*3/uL — ABNORMAL LOW (ref 0.1–0.9)
NEUT%: 88.4 % — ABNORMAL HIGH (ref 38.4–76.8)
RDW: 14.1 % (ref 11.2–14.5)
WBC: 11 10*3/uL — ABNORMAL HIGH (ref 3.9–10.3)
lymph#: 1.1 10*3/uL (ref 0.9–3.3)

## 2012-08-03 LAB — HOLD TUBE, BLOOD BANK

## 2012-08-03 NOTE — Telephone Encounter (Signed)
We were trying to simplify by going to once daily insulin (Lantus) vs her meal time insulin.  Two reasons I was leaning away from pills:  1) increased risks -eg hypoglycemia with sulfonylurea and 2) they would not likely be as effective as insulin

## 2012-08-03 NOTE — Telephone Encounter (Signed)
PT daughter called and stated that her mother is unable to give her self insulin shots and was under the impression that she would be receiving oral medication. Upon picking up the medication last week, they received the pens again. Please assist.

## 2012-08-03 NOTE — Telephone Encounter (Signed)
Pt daughter informed and she will work with her mother and call with any questions/concerns

## 2012-08-03 NOTE — Telephone Encounter (Signed)
I spoke with the daughter Lynden Ang who came with pt to OV last week.  Her mother has a hard time checking her BS before each meal for the sliding scale for the Humalog Kwikpen.  It's just too much for her, she would prefer to take another pill.  Pt often will even forget to eat because she gets sidetracked so easily, so easily confused and forgetful.  Please advise  We had given pt sample Lantus pen last week, which is what she is referring to in her message

## 2012-08-07 ENCOUNTER — Telehealth: Payer: Self-pay | Admitting: Family Medicine

## 2012-08-07 DIAGNOSIS — E119 Type 2 diabetes mellitus without complications: Secondary | ICD-10-CM

## 2012-08-07 MED ORDER — INSULIN PEN NEEDLE 31G X 5 MM MISC: Status: DC

## 2012-08-07 MED ORDER — INSULIN GLARGINE 100 UNIT/ML ~~LOC~~ SOLN: 10.0000 [IU] | Freq: Every day | SUBCUTANEOUS | Status: DC

## 2012-08-07 NOTE — Telephone Encounter (Signed)
Patient called stating that she need an rx for lantus solostar 100 units and pen needles called into [facility] [address]. Please assist.

## 2012-08-07 NOTE — Telephone Encounter (Signed)
I did call to check on her today and asked how things were going with the Lantus.  She likes giving only one shot a day.  I tried to explain she MAY still need the humalog if her BS is high at mealtimes, Follow the sliding scale.  She is confused and I suggested she come back in to the office next week to review her readings, etc.  She has agreed.  FYI

## 2012-08-07 NOTE — Telephone Encounter (Signed)
Call attempted at about 1335, 08/07/12 with no answer and message to call back left.

## 2012-08-07 NOTE — Telephone Encounter (Signed)
Pt BS have been running pretty good on the Lantus 10 units daily only.  She is still checking her BS and has not needed the Humalog

## 2012-08-07 NOTE — Telephone Encounter (Signed)
Caller: Meredith Rodriguez/Patient; Phone: [phone]; Reason for Call: Patient states she is confused regarding her Diabetes.  She wanted to stop taking her Insulin needle and Dr.  Caryl Never told her to start using solo-star pen.  She was understanding that she was to use only the Solo-star only.  However, when the office called her today and asked her if she was using the Humalog?  She told them no.  She is confused if she was to use both or only Solo-star.  Blood sugars are running- 08/04/12 7am-  255 11:40  299 15:50  230 17:30 160  08/06/12 6:30 204  17:25 169  08/07/12 7:00 191 11:45 289  PATIENT UNSURE IF YOU WANT HER TO BOTH HUMALOG AND SOL-STAR OR JUST SOLOSTAR-   PLEASE CONTACT [phone]) [phone]

## 2012-08-10 ENCOUNTER — Other Ambulatory Visit: Payer: Self-pay | Admitting: Adult Health

## 2012-08-10 ENCOUNTER — Other Ambulatory Visit (HOSPITAL_BASED_OUTPATIENT_CLINIC_OR_DEPARTMENT_OTHER): Payer: Medicare Other | Admitting: Lab

## 2012-08-10 ENCOUNTER — Encounter (HOSPITAL_COMMUNITY)
Admission: RE | Admit: 2012-08-10 | Discharge: 2012-08-10 | Disposition: A | Discharge status: Home / Self Care | Payer: Medicare Other | Source: Ambulatory Visit | Attending: Oncology | Admitting: Oncology

## 2012-08-10 DIAGNOSIS — D649 Anemia, unspecified: Secondary | ICD-10-CM | POA: Insufficient documentation

## 2012-08-10 DIAGNOSIS — C9200 Acute myeloblastic leukemia, not having achieved remission: Secondary | ICD-10-CM

## 2012-08-10 LAB — CBC WITH DIFFERENTIAL/PLATELET
BASO%: 0.7 % (ref 0.0–2.0)
EOS%: 0.9 % (ref 0.0–7.0)
HCT: 24.4 % — ABNORMAL LOW (ref 34.8–46.6)
LYMPH%: 10.1 % — ABNORMAL LOW (ref 14.0–49.7)
MCH: 28.6 pg (ref 25.1–34.0)
MCHC: 32.4 g/dL (ref 31.5–36.0)
NEUT%: 87.9 % — ABNORMAL HIGH (ref 38.4–76.8)
Platelets: 148 10*3/uL (ref 145–400)
RBC: 2.76 10*6/uL — ABNORMAL LOW (ref 3.70–5.45)
lymph#: 0.9 10*3/uL (ref 0.9–3.3)

## 2012-08-10 LAB — HOLD TUBE, BLOOD BANK

## 2012-08-11 ENCOUNTER — Ambulatory Visit (HOSPITAL_BASED_OUTPATIENT_CLINIC_OR_DEPARTMENT_OTHER): Payer: Medicare Other

## 2012-08-11 VITALS — BP 147/66 | HR 67 | Temp 97.0°F | Resp 20

## 2012-08-11 DIAGNOSIS — D649 Anemia, unspecified: Secondary | ICD-10-CM

## 2012-08-11 MED ORDER — DIPHENHYDRAMINE HCL 25 MG PO CAPS
25.0000 mg | ORAL_CAPSULE | Freq: Once | ORAL | Status: AC
  Administered 2012-08-11: 25 mg via ORAL

## 2012-08-11 MED ORDER — ACETAMINOPHEN 325 MG PO TABS
650.0000 mg | ORAL_TABLET | Freq: Once | ORAL | Status: AC
Start: 2012-08-11 — End: 2012-08-11
  Administered 2012-08-11: 650 mg via ORAL

## 2012-08-11 MED ORDER — SODIUM CHLORIDE 0.9 % IJ SOLN
10.0000 mL | INTRAMUSCULAR | Status: DC | PRN
  Administered 2012-08-11: 10 mL via INTRAVENOUS
  Filled 2012-08-11: qty 10

## 2012-08-11 MED ORDER — SODIUM CHLORIDE 0.9 % IV SOLN
250.0000 mL | Freq: Once | INTRAVENOUS | Status: AC
  Administered 2012-08-11: 250 mL via INTRAVENOUS

## 2012-08-11 MED ORDER — HEPARIN SOD (PORK) LOCK FLUSH 100 UNIT/ML IV SOLN
500.0000 [IU] | Freq: Once | INTRAVENOUS | Status: AC
  Administered 2012-08-11: 500 [IU] via INTRAVENOUS
  Filled 2012-08-11: qty 5

## 2012-08-11 NOTE — Patient Instructions (Addendum)
Blood Transfusion Information WHAT IS A BLOOD TRANSFUSION? A transfusion is the replacement of blood or some of its parts. Blood is made up of multiple cells which provide different functions.  Red blood cells carry oxygen and are used for blood loss replacement.  White blood cells fight against infection.  Platelets control bleeding.  Plasma helps clot blood.  Other blood products are available for specialized needs, such as hemophilia or other clotting disorders. BEFORE THE TRANSFUSION  Who gives blood for transfusions?   You may be able to donate blood to be used at a later date on yourself (autologous donation).  Relatives can be asked to donate blood. This is generally not any safer than if you have received blood from a stranger. The same precautions are taken to ensure safety when a relative's blood is donated.  Healthy volunteers who are fully evaluated to make sure their blood is safe. This is blood bank blood. Transfusion therapy is the safest it has ever been in the practice of medicine. Before blood is taken from a donor, a complete history is taken to make sure that person has no history of diseases nor engages in risky social behavior (examples are intravenous drug use or sexual activity with multiple partners). The donor's travel history is screened to minimize risk of transmitting infections, such as malaria. The donated blood is tested for signs of infectious diseases, such as HIV and hepatitis. The blood is then tested to be sure it is compatible with you in order to minimize the chance of a transfusion reaction. If you or a relative donates blood, this is often done in anticipation of surgery and is not appropriate for emergency situations. It takes many days to process the donated blood. RISKS AND COMPLICATIONS Although transfusion therapy is very safe and saves many lives, the main dangers of transfusion include:   Getting an infectious disease.  Developing a  transfusion reaction. This is an allergic reaction to something in the blood you were given. Every precaution is taken to prevent this. The decision to have a blood transfusion has been considered carefully by your caregiver before blood is given. Blood is not given unless the benefits outweigh the risks. AFTER THE TRANSFUSION  Right after receiving a blood transfusion, you will usually feel much better and more energetic. This is especially true if your red blood cells have gotten low (anemic). The transfusion raises the level of the red blood cells which carry oxygen, and this usually causes an energy increase.  The nurse administering the transfusion will monitor you carefully for complications. HOME CARE INSTRUCTIONS  No special instructions are needed after a transfusion. You may find your energy is better. Speak with your caregiver about any limitations on activity for underlying diseases you may have. SEEK MEDICAL CARE IF:   Your condition is not improving after your transfusion.  You develop redness or irritation at the intravenous (IV) site. SEEK IMMEDIATE MEDICAL CARE IF:  Any of the following symptoms occur over the next 12 hours:  Shaking chills.  You have a temperature by mouth above 102 F (38.9 C), not controlled by medicine.  Chest, back, or muscle pain.  People around you feel you are not acting correctly or are confused.  Shortness of breath or difficulty breathing.  Dizziness and fainting.  You get a rash or develop hives.  You have a decrease in urine output.  Your urine turns a dark color or changes to pink, red, or brown. Any of the following   symptoms occur over the next 10 days:  You have a temperature by mouth above 102 F (38.9 C), not controlled by medicine.  Shortness of breath.  Weakness after normal activity.  The white part of the eye turns yellow (jaundice).  You have a decrease in the amount of urine or are urinating less often.  Your  urine turns a dark color or changes to pink, red, or brown. Document Released: 04/12/2000 Document Revised: 07/08/2011 Document Reviewed: 11/30/2007 Exit[employer] Patient Information 2013 [employer].  

## 2012-08-12 LAB — TYPE AND SCREEN
Donor AG Type: NEGATIVE
Donor AG Type: NEGATIVE

## 2012-08-17 ENCOUNTER — Other Ambulatory Visit (HOSPITAL_BASED_OUTPATIENT_CLINIC_OR_DEPARTMENT_OTHER): Payer: Medicare Other | Admitting: Lab

## 2012-08-17 DIAGNOSIS — C9200 Acute myeloblastic leukemia, not having achieved remission: Secondary | ICD-10-CM

## 2012-08-17 LAB — CBC WITH DIFFERENTIAL/PLATELET
Basophils Absolute: 0.1 10*3/uL (ref 0.0–0.1)
Eosinophils Absolute: 0.1 10*3/uL (ref 0.0–0.5)
HCT: 31.2 % — ABNORMAL LOW (ref 34.8–46.6)
LYMPH%: 12 % — ABNORMAL LOW (ref 14.0–49.7)
MCHC: 33.3 g/dL (ref 31.5–36.0)
MONO#: 0 10*3/uL — ABNORMAL LOW (ref 0.1–0.9)
NEUT#: 6.7 10*3/uL — ABNORMAL HIGH (ref 1.5–6.5)
NEUT%: 86 % — ABNORMAL HIGH (ref 38.4–76.8)
Platelets: 132 10*3/uL — ABNORMAL LOW (ref 145–400)
WBC: 7.8 10*3/uL (ref 3.9–10.3)

## 2012-08-24 ENCOUNTER — Encounter: Payer: Self-pay | Admitting: Adult Health

## 2012-08-24 ENCOUNTER — Ambulatory Visit (HOSPITAL_BASED_OUTPATIENT_CLINIC_OR_DEPARTMENT_OTHER): Payer: Medicare Other | Admitting: Adult Health

## 2012-08-24 ENCOUNTER — Telehealth: Payer: Self-pay | Admitting: Oncology

## 2012-08-24 ENCOUNTER — Other Ambulatory Visit (HOSPITAL_BASED_OUTPATIENT_CLINIC_OR_DEPARTMENT_OTHER): Payer: Medicare Other | Admitting: Lab

## 2012-08-24 VITALS — BP 160/70 | HR 70 | Temp 98.0°F | Resp 20 | Ht 64.0 in | Wt 102.7 lb

## 2012-08-24 DIAGNOSIS — C9200 Acute myeloblastic leukemia, not having achieved remission: Secondary | ICD-10-CM

## 2012-08-24 DIAGNOSIS — D4620 Refractory anemia with excess of blasts, unspecified: Secondary | ICD-10-CM

## 2012-08-24 LAB — CBC WITH DIFFERENTIAL/PLATELET
Basophils Absolute: 0 10*3/uL (ref 0.0–0.1)
EOS%: 1.4 % (ref 0.0–7.0)
HCT: 31 % — ABNORMAL LOW (ref 34.8–46.6)
HGB: 9.9 g/dL — ABNORMAL LOW (ref 11.6–15.9)
MCH: 28.8 pg (ref 25.1–34.0)
MCV: 90.1 fL (ref 79.5–101.0)
MONO%: 0.5 % (ref 0.0–14.0)
NEUT%: 83.1 % — ABNORMAL HIGH (ref 38.4–76.8)
Platelets: 194 10*3/uL (ref 145–400)

## 2012-08-24 NOTE — Progress Notes (Signed)
OFFICE PROGRESS NOTE  CC  [name], MD [address] [place] [address]  DIAGNOSIS: 79 year old female with severe and profound anemia likely due to a primary bone marrow problem. 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  #2 patient has been on Procrit every 2 weeks unfortunately this has not effective and we will discontinue this.  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  #4 Patient received 3 cycles of Decitabine chemotherapy, her third cycle was complicated by febrile neutropenia and pneumonia causing hospitalization.    CURRENT THERAPY:Supportive care  INTERVAL HISTORY: Meredith Rodriguez 79 y.o. female returns for follow up today. Doing well today.  She last required a blood transfusion on 4/14, she usually needs one every 3 weeks.  Her energy was improved after receiving it.  Otherwise, she is feeling well and a 10 point ROS is neg.   MEDICAL HISTORY: Past Medical History  Diagnosis Date  . Hypertension   . Diabetes mellitus   . Hyperkalemia   . IBS (irritable bowel syndrome)   . Hypercholesterolemia   . Cervical radiculopathy   . Anemia   . Cancer     leukemia    ALLERGIES:  is allergic to amoxicillin; cephalexin; metformin and related; phenergan; and procardia.  MEDICATIONS:  Current Outpatient Prescriptions  Medication Sig Dispense Refill  . amLODipine (NORVASC) 5 MG tablet Take 2.5 mg by mouth every morning.       . cholecalciferol (VITAMIN D) 1000 UNITS tablet Take 1,000 Units by mouth daily.      Marland Kitchen glucose blood (FREESTYLE LITE) test strip Test blood sugar QID or as directed by MD.  100 each  11  . insulin glargine (LANTUS SOLOSTAR) 100 UNIT/ML injection Inject 0.1 mLs (10 Units total) into the skin daily.  5 pen  1  . Insulin Pen Needle (B-D UF III MINI PEN NEEDLES) 31G X 5  MM MISC Use with Lantus pen daily as instructed  100 each  6  . Lancets (FREESTYLE) lancets 1 each by Other route 4 (four) times daily. Use as instructed  200 each  12  . lidocaine-prilocaine (EMLA) cream Apply topically as needed.  30 g  4  . LORazepam (ATIVAN) 0.5 MG tablet       . metoprolol (LOPRESSOR) 50 MG tablet Take 1 tablet (50 mg total) by mouth 2 (two) times daily.  180 tablet  3  . Omega-3 Fatty Acids (FISH OIL) 1000 MG CAPS Take 1,000 mg by mouth daily.       . simvastatin (ZOCOR) 5 MG tablet Take 5 mg by mouth at bedtime.      . mometasone (ELOCON) 0.1 % lotion Apply topically daily.  60 mL  1  . pregabalin (LYRICA) 75 MG capsule Take 1 capsule (75 mg total) by mouth 2 (two) times daily.  60 capsule  0  . [DISCONTINUED] glimepiride (AMARYL) 2 MG tablet Take 1 tablet (2 mg total) by mouth every morning.  30 tablet  11   Current Facility-Administered Medications  Medication Dose Route Frequency Provider Last Rate Last Dose  . TDaP (BOOSTRIX) injection 0.5 mL  0.5 mL Intramuscular Once [name], MD        SURGICAL HISTORY:  Past Surgical History  Procedure Laterality Date  . Varicose veins      surgey 1959  . Tonsilectomy, adenoidectomy,  bilateral myringotomy and tubes    . Esophagogastroduodenoscopy  04/01/2011    Procedure: ESOPHAGOGASTRODUODENOSCOPY (EGD);  Surgeon: Barrie Folk, MD;  Location: [facility] ENDOSCOPY;  Service: Endoscopy;  Laterality: N/A;    REVIEW OF SYSTEMS:   General: fatigue (+), night sweats (-), fever (-), pain (-) Lymph: palpable nodes (-) HEENT: vision changes (-), mucositis (-), gum bleeding (-), epistaxis (-) Cardiovascular: chest pain (-), palpitations (-) Pulmonary: shortness of breath (-), dyspnea on exertion (-), cough (-), hemoptysis (-) GI:  Early satiety (-), melena (-), dysphagia (-), nausea/vomiting (-), diarrhea (-) GU: dysuria (-), hematuria (-), incontinence (-) Musculoskeletal: joint swelling (-), joint pain (-), back pain  (-) Neuro: weakness (-), numbness (-), headache (-), confusion (-) Skin: Rash (-), lesions (-), dryness (-) Psych: depression (-), suicidal/homicidal ideation (-), feeling of hopelessness (-)   PHYSICAL EXAMINATION: BP 160/70  Pulse 70  Temp(Src) 98 F (36.7 C) (Oral)  Resp 20  Ht 5\' 4"  (1.626 m)  Wt 102 lb 11.2 oz (46.584 kg)  BMI 17.62 kg/m2 General: Patient is a well appearing female in no acute distress HEENT: PERRLA, sclerae anicteric no conjunctival pallor, MMM no erythema or exudate, right nare with large blood clot Neck: supple, no lymphadenopathy Lungs: clear to auscultation bilaterally, no wheezes, rhonchi, or rales Cardiovascular: regular rate rhythm, S1, S2, no murmurs, rubs or gallops Abdomen: Soft, non-tender, non-distended, normoactive bowel sounds, no HSM Extremities: warm and well perfused, no clubbing, cyanosis, or edema Skin: No rashes.    Neuro: Non-focal ECOG PERFORMANCE STATUS: 1  LABORATORY DATA: Lab Results  Component Value Date   WBC 8.4 08/24/2012   HGB 9.9* 08/24/2012   HCT 31.0* 08/24/2012   MCV 90.1 08/24/2012   PLT 194 08/24/2012      Chemistry      Component Value Date/Time   NA 137 06/01/2012 1028   NA 138 05/25/2012 0550   K 4.2 06/01/2012 1028   K 3.9 05/25/2012 0550   CL 99 06/01/2012 1028   CL 100 05/25/2012 0550   CO2 28 06/01/2012 1028   CO2 33* 05/25/2012 0550   BUN 17.5 06/01/2012 1028   BUN 16 05/25/2012 0550   CREATININE 0.7 06/01/2012 1028   CREATININE 0.54 05/25/2012 0550   CREATININE 0.69 07/18/2010 1202   GLU 215* 02/26/2012 1255      Component Value Date/Time   CALCIUM 9.2 06/01/2012 1028   CALCIUM 9.0 05/25/2012 0550   ALKPHOS 45 06/01/2012 1028   ALKPHOS 42 05/18/2012 0955   AST 29 06/01/2012 1028   AST 16 05/18/2012 0955   ALT 52 06/01/2012 1028   ALT 19 05/18/2012 0955   BILITOT 0.55 06/01/2012 1028   BILITOT 1.1 05/18/2012 0955       RADIOGRAPHIC STUDIES:  No results found.  ASSESSMENT: 79 year old female with:  1.  severe anemia  due to primary refractory anemia Patient is receiving supportive therapy with blood transfusions.  2. 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 This is discussed with the daughter and the patient today recommendations were made as below   #3 patient and I and her daughters had an extensive discussion today about the likely transformation to acute leukemia. We discussed the process of chemotherapy, risks, benefits, and potential adverse effects of the drug.  Through much conversation between myself, the patient, and her children, the patient has decided to proceed with Decitabine chemotherapy.  Her chemotherapy has been discontinued due to her complication of febrile neutropenia  and pneumonia.  Much more chemotherapy could take her life, therefore, we will continue with supportive care only.    #5 Anemia  PLAN:  #1 Ms. Koury is here for her monthly visit.  Her labs have again done well this months.  She has averaged needing blood transfusions approximately every 3 weeks.  She has tolerated these well.   #2 Ms. Dragan will return for weekly lab visits, and I will see her back in 4 weeks.    All questions were answered. The patient knows to call the clinic with any problems, questions or concerns. We can certainly see the patient much sooner if necessary.  I spent 15 minutes counseling the patient face to face. The total time spent in the appointment was 30 minutes.  This case was reviewed with Dr. Welton Flakes.    Cherie Ouch Lyn Hollingshead, NP Medical Oncology [facility] Phone: [phone] 08/24/2012, 12:17 PM

## 2012-08-24 NOTE — Patient Instructions (Signed)
Doing well.  Labs are stable.  We will continue with weekly labs and monthly appointments.  Please call us if you have any questions or concerns.

## 2012-08-31 ENCOUNTER — Other Ambulatory Visit (HOSPITAL_BASED_OUTPATIENT_CLINIC_OR_DEPARTMENT_OTHER): Payer: Medicare Other | Admitting: Lab

## 2012-08-31 DIAGNOSIS — D649 Anemia, unspecified: Secondary | ICD-10-CM

## 2012-08-31 DIAGNOSIS — C9200 Acute myeloblastic leukemia, not having achieved remission: Secondary | ICD-10-CM

## 2012-08-31 LAB — CBC WITH DIFFERENTIAL/PLATELET
BASO%: 0.8 % (ref 0.0–2.0)
Basophils Absolute: 0.1 10*3/uL (ref 0.0–0.1)
EOS%: 1.5 % (ref 0.0–7.0)
MCH: 29.2 pg (ref 25.1–34.0)
MCHC: 32.7 g/dL (ref 31.5–36.0)
MCV: 89.1 fL (ref 79.5–101.0)
MONO%: 0.4 % (ref 0.0–14.0)
RBC: 3.05 10*6/uL — ABNORMAL LOW (ref 3.70–5.45)
RDW: 15.2 % — ABNORMAL HIGH (ref 11.2–14.5)
lymph#: 1 10*3/uL (ref 0.9–3.3)

## 2012-09-07 ENCOUNTER — Ambulatory Visit (HOSPITAL_BASED_OUTPATIENT_CLINIC_OR_DEPARTMENT_OTHER): Payer: Medicare Other

## 2012-09-07 ENCOUNTER — Other Ambulatory Visit (HOSPITAL_BASED_OUTPATIENT_CLINIC_OR_DEPARTMENT_OTHER): Payer: Medicare Other

## 2012-09-07 ENCOUNTER — Encounter (HOSPITAL_COMMUNITY)
Admission: RE | Admit: 2012-09-07 | Discharge: 2012-09-07 | Disposition: A | Discharge status: Home / Self Care | Payer: Medicare Other | Source: Ambulatory Visit | Attending: Oncology | Admitting: Oncology

## 2012-09-07 ENCOUNTER — Other Ambulatory Visit: Payer: Self-pay | Admitting: Adult Health

## 2012-09-07 VITALS — BP 141/50 | HR 69 | Temp 98.6°F | Resp 18

## 2012-09-07 DIAGNOSIS — D649 Anemia, unspecified: Secondary | ICD-10-CM

## 2012-09-07 DIAGNOSIS — C9200 Acute myeloblastic leukemia, not having achieved remission: Secondary | ICD-10-CM

## 2012-09-07 LAB — CBC WITH DIFFERENTIAL/PLATELET
BASO%: 1.4 % (ref 0.0–2.0)
LYMPH%: 15.1 % (ref 14.0–49.7)
MCHC: 33.2 g/dL (ref 31.5–36.0)
MONO#: 0 10*3/uL — ABNORMAL LOW (ref 0.1–0.9)
MONO%: 0.3 % (ref 0.0–14.0)
Platelets: 198 10*3/uL (ref 145–400)
RBC: 2.7 10*6/uL — ABNORMAL LOW (ref 3.70–5.45)
RDW: 15.2 % — ABNORMAL HIGH (ref 11.2–14.5)
WBC: 7.1 10*3/uL (ref 3.9–10.3)

## 2012-09-07 LAB — HOLD TUBE, BLOOD BANK

## 2012-09-07 MED ORDER — DIPHENHYDRAMINE HCL 25 MG PO CAPS
25.0000 mg | ORAL_CAPSULE | Freq: Once | ORAL | Status: AC
  Administered 2012-09-07: 25 mg via ORAL

## 2012-09-07 MED ORDER — SODIUM CHLORIDE 0.9 % IJ SOLN
10.0000 mL | INTRAMUSCULAR | Status: AC | PRN
  Administered 2012-09-07: 10 mL
  Filled 2012-09-07: qty 10

## 2012-09-07 MED ORDER — HEPARIN SOD (PORK) LOCK FLUSH 100 UNIT/ML IV SOLN
500.0000 [IU] | Freq: Every day | INTRAVENOUS | Status: AC | PRN
  Administered 2012-09-07: 500 [IU]
  Filled 2012-09-07: qty 5

## 2012-09-07 MED ORDER — ACETAMINOPHEN 325 MG PO TABS
650.0000 mg | ORAL_TABLET | Freq: Once | ORAL | Status: AC
  Administered 2012-09-07: 650 mg via ORAL

## 2012-09-07 NOTE — Patient Instructions (Addendum)
Blood Transfusion Information WHAT IS A BLOOD TRANSFUSION? A transfusion is the replacement of blood or some of its parts. Blood is made up of multiple cells which provide different functions.  Red blood cells carry oxygen and are used for blood loss replacement.  White blood cells fight against infection.  Platelets control bleeding.  Plasma helps clot blood.  Other blood products are available for specialized needs, such as hemophilia or other clotting disorders. BEFORE THE TRANSFUSION  Who gives blood for transfusions?   You may be able to donate blood to be used at a later date on yourself (autologous donation).  Relatives can be asked to donate blood. This is generally not any safer than if you have received blood from a stranger. The same precautions are taken to ensure safety when a relative's blood is donated.  Healthy volunteers who are fully evaluated to make sure their blood is safe. This is blood bank blood. Transfusion therapy is the safest it has ever been in the practice of medicine. Before blood is taken from a donor, a complete history is taken to make sure that person has no history of diseases nor engages in risky social behavior (examples are intravenous drug use or sexual activity with multiple partners). The donor's travel history is screened to minimize risk of transmitting infections, such as malaria. The donated blood is tested for signs of infectious diseases, such as HIV and hepatitis. The blood is then tested to be sure it is compatible with you in order to minimize the chance of a transfusion reaction. If you or a relative donates blood, this is often done in anticipation of surgery and is not appropriate for emergency situations. It takes many days to process the donated blood. RISKS AND COMPLICATIONS Although transfusion therapy is very safe and saves many lives, the main dangers of transfusion include:   Getting an infectious disease.  Developing a  transfusion reaction. This is an allergic reaction to something in the blood you were given. Every precaution is taken to prevent this. The decision to have a blood transfusion has been considered carefully by your caregiver before blood is given. Blood is not given unless the benefits outweigh the risks. AFTER THE TRANSFUSION  Right after receiving a blood transfusion, you will usually feel much better and more energetic. This is especially true if your red blood cells have gotten low (anemic). The transfusion raises the level of the red blood cells which carry oxygen, and this usually causes an energy increase.  The nurse administering the transfusion will monitor you carefully for complications. HOME CARE INSTRUCTIONS  No special instructions are needed after a transfusion. You may find your energy is better. Speak with your caregiver about any limitations on activity for underlying diseases you may have. SEEK MEDICAL CARE IF:   Your condition is not improving after your transfusion.  You develop redness or irritation at the intravenous (IV) site. SEEK IMMEDIATE MEDICAL CARE IF:  Any of the following symptoms occur over the next 12 hours:  Shaking chills.  You have a temperature by mouth above 102 F (38.9 C), not controlled by medicine.  Chest, back, or muscle pain.  People around you feel you are not acting correctly or are confused.  Shortness of breath or difficulty breathing.  Dizziness and fainting.  You get a rash or develop hives.  You have a decrease in urine output.  Your urine turns a dark color or changes to pink, red, or brown. Any of the following   symptoms occur over the next 10 days:  You have a temperature by mouth above 102 F (38.9 C), not controlled by medicine.  Shortness of breath.  Weakness after normal activity.  The white part of the eye turns yellow (jaundice).  You have a decrease in the amount of urine or are urinating less often.  Your  urine turns a dark color or changes to pink, red, or brown. Document Released: 04/12/2000 Document Revised: 07/08/2011 Document Reviewed: 11/30/2007 Exit[employer] Patient Information 2013 [employer].  

## 2012-09-08 LAB — TYPE AND SCREEN
Antibody Screen: NEGATIVE
Donor AG Type: NEGATIVE
Unit division: 0

## 2012-09-14 ENCOUNTER — Other Ambulatory Visit: Payer: Self-pay | Admitting: Family Medicine

## 2012-09-14 ENCOUNTER — Ambulatory Visit (HOSPITAL_BASED_OUTPATIENT_CLINIC_OR_DEPARTMENT_OTHER): Payer: Medicare Other

## 2012-09-14 DIAGNOSIS — D649 Anemia, unspecified: Secondary | ICD-10-CM

## 2012-09-14 DIAGNOSIS — C9200 Acute myeloblastic leukemia, not having achieved remission: Secondary | ICD-10-CM

## 2012-09-14 LAB — CBC WITH DIFFERENTIAL/PLATELET
Basophils Absolute: 0 10*3/uL (ref 0.0–0.1)
Eosinophils Absolute: 0.1 10*3/uL (ref 0.0–0.5)
HCT: 33.6 % — ABNORMAL LOW (ref 34.8–46.6)
HGB: 10.8 g/dL — ABNORMAL LOW (ref 11.6–15.9)
MCV: 89.3 fL (ref 79.5–101.0)
NEUT#: 4.9 10*3/uL (ref 1.5–6.5)
NEUT%: 80.3 % — ABNORMAL HIGH (ref 38.4–76.8)
RDW: 15 % — ABNORMAL HIGH (ref 11.2–14.5)
lymph#: 1.1 10*3/uL (ref 0.9–3.3)

## 2012-09-22 ENCOUNTER — Telehealth: Payer: Self-pay | Admitting: Oncology

## 2012-09-22 ENCOUNTER — Encounter: Payer: Self-pay | Admitting: Adult Health

## 2012-09-22 ENCOUNTER — Ambulatory Visit (HOSPITAL_BASED_OUTPATIENT_CLINIC_OR_DEPARTMENT_OTHER): Payer: Medicare Other | Admitting: Adult Health

## 2012-09-22 ENCOUNTER — Other Ambulatory Visit (HOSPITAL_BASED_OUTPATIENT_CLINIC_OR_DEPARTMENT_OTHER): Payer: Medicare Other | Admitting: Lab

## 2012-09-22 VITALS — BP 144/61 | HR 72 | Temp 98.9°F | Resp 20 | Ht 64.0 in | Wt 104.6 lb

## 2012-09-22 DIAGNOSIS — C9200 Acute myeloblastic leukemia, not having achieved remission: Secondary | ICD-10-CM

## 2012-09-22 LAB — CBC WITH DIFFERENTIAL/PLATELET
EOS%: 1.1 % (ref 0.0–7.0)
Eosinophils Absolute: 0.1 10*3/uL (ref 0.0–0.5)
LYMPH%: 15.2 % (ref 14.0–49.7)
MCH: 28.4 pg (ref 25.1–34.0)
MCV: 90.3 fL (ref 79.5–101.0)
MONO%: 0.3 % (ref 0.0–14.0)
NEUT#: 5.9 10*3/uL (ref 1.5–6.5)
Platelets: 310 10*3/uL (ref 145–400)
RBC: 3.31 10*6/uL — ABNORMAL LOW (ref 3.70–5.45)
nRBC: 0 % (ref 0–0)

## 2012-09-22 NOTE — Patient Instructions (Addendum)
Doing well.  Labs have remained stable.  We will continue to check your blood counts weekly and see you back on 10/26/12.  Please call us if you have any questions or concerns.

## 2012-09-22 NOTE — Progress Notes (Signed)
OFFICE PROGRESS NOTE  CC  [name], MD [address] [place] [address]  DIAGNOSIS: 79 year old female with severe and profound anemia likely due to a primary bone marrow problem. 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  #2 patient has been on Procrit every 2 weeks unfortunately this has not effective and we will discontinue this.  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  #4 Patient received 3 cycles of Decitabine chemotherapy, her third cycle was complicated by febrile neutropenia and pneumonia causing hospitalization.    CURRENT THERAPY:Supportive care  INTERVAL HISTORY: Meredith Rodriguez 79 y.o. female returns for follow up today for her AML.  She has been having weekly lab visits and required a blood transfusion on 09/07/12.  Her hemoglobin is 9.4 today.  She denies denies fevers, chills, nausea, vomiting, weight loss, palpitations, pain. She does have occasional night sweats, but these were most prevalent two weeks ago, and she hasn't had any recently.  Otherwise, a 10 point ROS is neg.    MEDICAL HISTORY: Past Medical History  Diagnosis Date  . Hypertension   . Diabetes mellitus   . Hyperkalemia   . IBS (irritable bowel syndrome)   . Hypercholesterolemia   . Cervical radiculopathy   . Anemia   . Cancer     leukemia    ALLERGIES:  is allergic to amoxicillin; cephalexin; metformin and related; phenergan; and procardia.  MEDICATIONS:  Current Outpatient Prescriptions  Medication Sig Dispense Refill  . amLODipine (NORVASC) 5 MG tablet Take 2.5 mg by mouth every morning.       Marland Kitchen amLODipine (NORVASC) 5 MG tablet TAKE 1 TABLET BY MOUTH DAILY.  90 tablet  3  . cholecalciferol (VITAMIN D) 1000 UNITS tablet Take 1,000 Units by mouth daily.      Marland Kitchen glucose blood (FREESTYLE LITE) test strip  Test blood sugar QID or as directed by MD.  100 each  11  . insulin glargine (LANTUS SOLOSTAR) 100 UNIT/ML injection Inject 0.1 mLs (10 Units total) into the skin daily.  5 pen  1  . Insulin Pen Needle (B-D UF III MINI PEN NEEDLES) 31G X 5 MM MISC Use with Lantus pen daily as instructed  100 each  6  . Lancets (FREESTYLE) lancets 1 each by Other route 4 (four) times daily. Use as instructed  200 each  12  . lidocaine-prilocaine (EMLA) cream Apply topically as needed.  30 g  4  . LORazepam (ATIVAN) 0.5 MG tablet       . metoprolol (LOPRESSOR) 50 MG tablet Take 1 tablet (50 mg total) by mouth 2 (two) times daily.  180 tablet  3  . mometasone (ELOCON) 0.1 % lotion Apply topically daily.  60 mL  1  . Omega-3 Fatty Acids (FISH OIL) 1000 MG CAPS Take 1,000 mg by mouth daily.       . pregabalin (LYRICA) 75 MG capsule Take 1 capsule (75 mg total) by mouth 2 (two) times daily.  60 capsule  0  . simvastatin (ZOCOR) 5 MG tablet Take 5 mg by mouth at bedtime.      . [DISCONTINUED] glimepiride (AMARYL) 2 MG tablet Take 1 tablet (2 mg total) by mouth every morning.  30 tablet  11   Current Facility-Administered Medications  Medication Dose Route Frequency Provider Last Rate Last Dose  . TDaP (BOOSTRIX) injection  0.5 mL  0.5 mL Intramuscular Once [name], MD        SURGICAL HISTORY:  Past Surgical History  Procedure Laterality Date  . Varicose veins      surgey 1959  . Tonsilectomy, adenoidectomy, bilateral myringotomy and tubes    . Esophagogastroduodenoscopy  04/01/2011    Procedure: ESOPHAGOGASTRODUODENOSCOPY (EGD);  Surgeon: Barrie Folk, MD;  Location: [facility] ENDOSCOPY;  Service: Endoscopy;  Laterality: N/A;    REVIEW OF SYSTEMS:   General: fatigue (-), night sweats (+), fever (-), pain (-) Lymph: palpable nodes (-) HEENT: vision changes (-), mucositis (-), gum bleeding (-), epistaxis (-) Cardiovascular: chest pain (-), palpitations (-) Pulmonary: shortness of breath (-), dyspnea on  exertion (-), cough (-), hemoptysis (-) GI:  Early satiety (-), melena (-), dysphagia (-), nausea/vomiting (-), diarrhea (-) GU: dysuria (-), hematuria (-), incontinence (-) Musculoskeletal: joint swelling (-), joint pain (-), back pain (-) Neuro: weakness (-), numbness (-), headache (-), confusion (-) Skin: Rash (-), lesions (-), dryness (-) Psych: depression (-), suicidal/homicidal ideation (-), feeling of hopelessness (-)   PHYSICAL EXAMINATION: There were no vitals taken for this visit. General: Patient is a well appearing female in no acute distress HEENT: PERRLA, sclerae anicteric no conjunctival pallor, MMM no erythema or exudate, right nare with large blood clot Neck: supple, no lymphadenopathy Lungs: clear to auscultation bilaterally, no wheezes, rhonchi, or rales Cardiovascular: regular rate rhythm, S1, S2, no murmurs, rubs or gallops Abdomen: Soft, non-tender, non-distended, normoactive bowel sounds, no HSM Extremities: warm and well perfused, no clubbing, cyanosis, or edema Skin: No rashes.    Neuro: Non-focal ECOG PERFORMANCE STATUS: 1  LABORATORY DATA: Lab Results  Component Value Date   WBC 7.1 09/22/2012   HGB 9.4* 09/22/2012   HCT 29.9* 09/22/2012   MCV 90.3 09/22/2012   PLT 310 09/22/2012      Chemistry      Component Value Date/Time   NA 137 06/01/2012 1028   NA 138 05/25/2012 0550   K 4.2 06/01/2012 1028   K 3.9 05/25/2012 0550   CL 99 06/01/2012 1028   CL 100 05/25/2012 0550   CO2 28 06/01/2012 1028   CO2 33* 05/25/2012 0550   BUN 17.5 06/01/2012 1028   BUN 16 05/25/2012 0550   CREATININE 0.7 06/01/2012 1028   CREATININE 0.54 05/25/2012 0550   CREATININE 0.69 07/18/2010 1202   GLU 215* 02/26/2012 1255      Component Value Date/Time   CALCIUM 9.2 06/01/2012 1028   CALCIUM 9.0 05/25/2012 0550   ALKPHOS 45 06/01/2012 1028   ALKPHOS 42 05/18/2012 0955   AST 29 06/01/2012 1028   AST 16 05/18/2012 0955   ALT 52 06/01/2012 1028   ALT 19 05/18/2012 0955   BILITOT 0.55 06/01/2012  1028   BILITOT 1.1 05/18/2012 0955       RADIOGRAPHIC STUDIES:  No results found.  ASSESSMENT: 79 year old female with:  1.  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  2. 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 This is discussed with the daughter and the patient today recommendations were made as below   #3 patient and I and her daughters had an extensive discussion today about the likely transformation to acute leukemia. We discussed the process of chemotherapy, risks, benefits, and potential adverse effects of the drug.  Through much conversation between myself, the patient, and her children, the patient has decided  to proceed with Decitabine chemotherapy.  Her chemotherapy has been discontinued due to her complication of febrile neutropenia and pneumonia.  Much more chemotherapy could take her life, therefore, we will continue with supportive care only.    #5 Anemia  PLAN:  #1 Ms. Holik is here for her monthly visit.  Her labs have again done well this month.  She has continues to need a blood transfusion every 3-4 weeks.    #2 Ms. Delcid will return for weekly lab visits, and I will see her back in 4 weeks.    All questions were answered. The patient knows to call the clinic with any problems, questions or concerns. We can certainly see the patient much sooner if necessary.  I spent 15 minutes counseling the patient face to face. The total time spent in the appointment was 30 minutes.  This case was reviewed with Dr. Welton Flakes.    Cherie Ouch Lyn Hollingshead, NP Medical Oncology [facility] Phone: [phone] 09/22/2012, 8:44 AM

## 2012-09-25 ENCOUNTER — Encounter: Payer: Self-pay | Admitting: Family Medicine

## 2012-09-28 ENCOUNTER — Other Ambulatory Visit (HOSPITAL_BASED_OUTPATIENT_CLINIC_OR_DEPARTMENT_OTHER): Payer: Medicare Other | Admitting: Lab

## 2012-09-28 ENCOUNTER — Other Ambulatory Visit: Payer: Self-pay | Admitting: Medical Oncology

## 2012-09-28 ENCOUNTER — Encounter (HOSPITAL_COMMUNITY)
Admission: RE | Admit: 2012-09-28 | Discharge: 2012-09-28 | Disposition: A | Discharge status: Home / Self Care | Payer: Medicare Other | Source: Ambulatory Visit | Attending: Oncology | Admitting: Oncology

## 2012-09-28 ENCOUNTER — Ambulatory Visit (HOSPITAL_BASED_OUTPATIENT_CLINIC_OR_DEPARTMENT_OTHER): Payer: Medicare Other

## 2012-09-28 ENCOUNTER — Other Ambulatory Visit: Payer: Self-pay | Admitting: Adult Health

## 2012-09-28 VITALS — BP 144/67 | HR 68 | Temp 97.6°F | Resp 18

## 2012-09-28 DIAGNOSIS — D630 Anemia in neoplastic disease: Secondary | ICD-10-CM

## 2012-09-28 DIAGNOSIS — C9200 Acute myeloblastic leukemia, not having achieved remission: Secondary | ICD-10-CM

## 2012-09-28 DIAGNOSIS — D4620 Refractory anemia with excess of blasts, unspecified: Secondary | ICD-10-CM

## 2012-09-28 DIAGNOSIS — D649 Anemia, unspecified: Secondary | ICD-10-CM

## 2012-09-28 LAB — PREPARE RBC (CROSSMATCH)

## 2012-09-28 LAB — CBC WITH DIFFERENTIAL/PLATELET
Basophils Absolute: 0.1 10*3/uL (ref 0.0–0.1)
Eosinophils Absolute: 0.1 10*3/uL (ref 0.0–0.5)
HGB: 8.5 g/dL — ABNORMAL LOW (ref 11.6–15.9)
LYMPH%: 12.6 % — ABNORMAL LOW (ref 14.0–49.7)
MCH: 28.7 pg (ref 25.1–34.0)
MCV: 89.1 fL (ref 79.5–101.0)
MONO%: 0.3 % (ref 0.0–14.0)
NEUT#: 6.4 10*3/uL (ref 1.5–6.5)
Platelets: 315 10*3/uL (ref 145–400)
RBC: 2.95 10*6/uL — ABNORMAL LOW (ref 3.70–5.45)

## 2012-09-28 MED ORDER — DIPHENHYDRAMINE HCL 25 MG PO CAPS
25.0000 mg | ORAL_CAPSULE | Freq: Once | ORAL | Status: AC
  Administered 2012-09-28: 25 mg via ORAL

## 2012-09-28 MED ORDER — ACETAMINOPHEN 325 MG PO TABS
650.0000 mg | ORAL_TABLET | Freq: Once | ORAL | Status: AC
  Administered 2012-09-28: 650 mg via ORAL

## 2012-09-29 LAB — TYPE AND SCREEN
ABO/RH(D): AB NEG
Antibody Screen: NEGATIVE
Donor AG Type: NEGATIVE
Donor AG Type: NEGATIVE
Unit division: 0

## 2012-10-05 ENCOUNTER — Other Ambulatory Visit (HOSPITAL_BASED_OUTPATIENT_CLINIC_OR_DEPARTMENT_OTHER): Payer: Medicare Other | Admitting: Lab

## 2012-10-05 DIAGNOSIS — C9200 Acute myeloblastic leukemia, not having achieved remission: Secondary | ICD-10-CM

## 2012-10-05 LAB — CBC WITH DIFFERENTIAL/PLATELET
Basophils Absolute: 0 10*3/uL (ref 0.0–0.1)
Eosinophils Absolute: 0.1 10*3/uL (ref 0.0–0.5)
HGB: 11.1 g/dL — ABNORMAL LOW (ref 11.6–15.9)
LYMPH%: 13.9 % — ABNORMAL LOW (ref 14.0–49.7)
MCH: 28.7 pg (ref 25.1–34.0)
MCV: 89.4 fL (ref 79.5–101.0)
MONO%: 0 % (ref 0.0–14.0)
NEUT#: 4.6 10*3/uL (ref 1.5–6.5)
Platelets: 246 10*3/uL (ref 145–400)

## 2012-10-12 ENCOUNTER — Other Ambulatory Visit (HOSPITAL_BASED_OUTPATIENT_CLINIC_OR_DEPARTMENT_OTHER): Payer: Medicare Other | Admitting: Lab

## 2012-10-12 DIAGNOSIS — C9200 Acute myeloblastic leukemia, not having achieved remission: Secondary | ICD-10-CM

## 2012-10-12 LAB — CBC WITH DIFFERENTIAL/PLATELET
Basophils Absolute: 0 10*3/uL (ref 0.0–0.1)
EOS%: 1.3 % (ref 0.0–7.0)
HCT: 30.3 % — ABNORMAL LOW (ref 34.8–46.6)
HGB: 9.9 g/dL — ABNORMAL LOW (ref 11.6–15.9)
LYMPH%: 14.7 % (ref 14.0–49.7)
MCH: 29.3 pg (ref 25.1–34.0)
MCV: 89.6 fL (ref 79.5–101.0)
MONO%: 0.4 % (ref 0.0–14.0)
NEUT%: 83.3 % — ABNORMAL HIGH (ref 38.4–76.8)
Platelets: 334 10*3/uL (ref 145–400)

## 2012-10-19 ENCOUNTER — Other Ambulatory Visit (HOSPITAL_BASED_OUTPATIENT_CLINIC_OR_DEPARTMENT_OTHER): Payer: Medicare Other | Admitting: Lab

## 2012-10-19 ENCOUNTER — Telehealth: Payer: Self-pay

## 2012-10-19 DIAGNOSIS — C9200 Acute myeloblastic leukemia, not having achieved remission: Secondary | ICD-10-CM

## 2012-10-19 LAB — CBC WITH DIFFERENTIAL/PLATELET
BASO%: 0.3 % (ref 0.0–2.0)
HCT: 27.1 % — ABNORMAL LOW (ref 34.8–46.6)
LYMPH%: 13.6 % — ABNORMAL LOW (ref 14.0–49.7)
MCH: 28.4 pg (ref 25.1–34.0)
MCHC: 32.1 g/dL (ref 31.5–36.0)
MONO#: 0 10*3/uL — ABNORMAL LOW (ref 0.1–0.9)
NEUT%: 84.8 % — ABNORMAL HIGH (ref 38.4–76.8)
Platelets: 355 10*3/uL (ref 145–400)
WBC: 6.1 10*3/uL (ref 3.9–10.3)

## 2012-10-19 NOTE — Telephone Encounter (Signed)
Spoke with pt regarding appts for lab/ov/blood on 6/30 and blood on 7/1. Pt voiced understanding and knows to call the office with any further questions. TMB

## 2012-10-26 ENCOUNTER — Other Ambulatory Visit: Payer: Self-pay | Admitting: *Deleted

## 2012-10-26 ENCOUNTER — Telehealth: Payer: Self-pay | Admitting: *Deleted

## 2012-10-26 ENCOUNTER — Ambulatory Visit (HOSPITAL_BASED_OUTPATIENT_CLINIC_OR_DEPARTMENT_OTHER): Payer: Medicare Other

## 2012-10-26 ENCOUNTER — Other Ambulatory Visit: Payer: Self-pay | Admitting: Emergency Medicine

## 2012-10-26 ENCOUNTER — Encounter: Payer: Self-pay | Admitting: Adult Health

## 2012-10-26 ENCOUNTER — Other Ambulatory Visit (HOSPITAL_BASED_OUTPATIENT_CLINIC_OR_DEPARTMENT_OTHER): Payer: Medicare Other | Admitting: Lab

## 2012-10-26 ENCOUNTER — Ambulatory Visit (HOSPITAL_BASED_OUTPATIENT_CLINIC_OR_DEPARTMENT_OTHER): Payer: Medicare Other | Admitting: Adult Health

## 2012-10-26 VITALS — BP 169/49 | HR 67 | Temp 97.7°F | Resp 20

## 2012-10-26 VITALS — BP 129/62 | HR 77 | Temp 98.3°F | Resp 20 | Ht 64.0 in | Wt 106.6 lb

## 2012-10-26 DIAGNOSIS — D630 Anemia in neoplastic disease: Secondary | ICD-10-CM

## 2012-10-26 DIAGNOSIS — C9200 Acute myeloblastic leukemia, not having achieved remission: Secondary | ICD-10-CM

## 2012-10-26 LAB — CBC WITH DIFFERENTIAL/PLATELET
Basophils Absolute: 0 10*3/uL (ref 0.0–0.1)
Eosinophils Absolute: 0.1 10*3/uL (ref 0.0–0.5)
HGB: 7.2 g/dL — ABNORMAL LOW (ref 11.6–15.9)
LYMPH%: 15.9 % (ref 14.0–49.7)
MCV: 88.2 fL (ref 79.5–101.0)
MONO%: 0.3 % (ref 0.0–14.0)
NEUT#: 5.3 10*3/uL (ref 1.5–6.5)
NEUT%: 82.5 % — ABNORMAL HIGH (ref 38.4–76.8)
Platelets: 341 10*3/uL (ref 145–400)
RBC: 2.54 10*6/uL — ABNORMAL LOW (ref 3.70–5.45)

## 2012-10-26 LAB — COMPREHENSIVE METABOLIC PANEL (CC13)
BUN: 17.5 mg/dL (ref 7.0–26.0)
CO2: 31 mEq/L — ABNORMAL HIGH (ref 22–29)
Calcium: 9.5 mg/dL (ref 8.4–10.4)
Chloride: 100 mEq/L (ref 98–109)
Creatinine: 0.7 mg/dL (ref 0.6–1.1)
Glucose: 222 mg/dl — ABNORMAL HIGH (ref 70–140)

## 2012-10-26 LAB — TECHNOLOGIST REVIEW

## 2012-10-26 MED ORDER — DIPHENHYDRAMINE HCL 25 MG PO CAPS
25.0000 mg | ORAL_CAPSULE | Freq: Once | ORAL | Status: AC
  Administered 2012-10-26: 25 mg via ORAL

## 2012-10-26 MED ORDER — HEPARIN SOD (PORK) LOCK FLUSH 100 UNIT/ML IV SOLN
500.0000 [IU] | Freq: Every day | INTRAVENOUS | Status: AC | PRN
  Administered 2012-10-26: 500 [IU]
  Filled 2012-10-26: qty 5

## 2012-10-26 MED ORDER — SODIUM CHLORIDE 0.9 % IJ SOLN
10.0000 mL | INTRAMUSCULAR | Status: AC | PRN
  Administered 2012-10-26: 10 mL
  Filled 2012-10-26: qty 10

## 2012-10-26 MED ORDER — SODIUM CHLORIDE 0.9 % IV SOLN
250.0000 mL | Freq: Once | INTRAVENOUS | Status: AC
  Administered 2012-10-26: 250 mL via INTRAVENOUS

## 2012-10-26 MED ORDER — ACETAMINOPHEN 325 MG PO TABS
650.0000 mg | ORAL_TABLET | Freq: Once | ORAL | Status: AC
  Administered 2012-10-26: 650 mg via ORAL

## 2012-10-26 NOTE — Telephone Encounter (Signed)
appts made and printed...td 

## 2012-10-26 NOTE — Patient Instructions (Signed)
Blood Transfusion Information WHAT IS A BLOOD TRANSFUSION? A transfusion is the replacement of blood or some of its parts. Blood is made up of multiple cells which provide different functions.  Red blood cells carry oxygen and are used for blood loss replacement.  White blood cells fight against infection.  Platelets control bleeding.  Plasma helps clot blood.  Other blood products are available for specialized needs, such as hemophilia or other clotting disorders. BEFORE THE TRANSFUSION  Who gives blood for transfusions?   You may be able to donate blood to be used at a later date on yourself (autologous donation).  Relatives can be asked to donate blood. This is generally not any safer than if you have received blood from a stranger. The same precautions are taken to ensure safety when a relative's blood is donated.  Healthy volunteers who are fully evaluated to make sure their blood is safe. This is blood bank blood. Transfusion therapy is the safest it has ever been in the practice of medicine. Before blood is taken from a donor, a complete history is taken to make sure that person has no history of diseases nor engages in risky social behavior (examples are intravenous drug use or sexual activity with multiple partners). The donor's travel history is screened to minimize risk of transmitting infections, such as malaria. The donated blood is tested for signs of infectious diseases, such as HIV and hepatitis. The blood is then tested to be sure it is compatible with you in order to minimize the chance of a transfusion reaction. If you or a relative donates blood, this is often done in anticipation of surgery and is not appropriate for emergency situations. It takes many days to process the donated blood. RISKS AND COMPLICATIONS Although transfusion therapy is very safe and saves many lives, the main dangers of transfusion include:   Getting an infectious disease.  Developing a  transfusion reaction. This is an allergic reaction to something in the blood you were given. Every precaution is taken to prevent this. The decision to have a blood transfusion has been considered carefully by your caregiver before blood is given. Blood is not given unless the benefits outweigh the risks. AFTER THE TRANSFUSION  Right after receiving a blood transfusion, you will usually feel much better and more energetic. This is especially true if your red blood cells have gotten low (anemic). The transfusion raises the level of the red blood cells which carry oxygen, and this usually causes an energy increase.  The nurse administering the transfusion will monitor you carefully for complications. HOME CARE INSTRUCTIONS  No special instructions are needed after a transfusion. You may find your energy is better. Speak with your caregiver about any limitations on activity for underlying diseases you may have. SEEK MEDICAL CARE IF:   Your condition is not improving after your transfusion.  You develop redness or irritation at the intravenous (IV) site. SEEK IMMEDIATE MEDICAL CARE IF:  Any of the following symptoms occur over the next 12 hours:  Shaking chills.  You have a temperature by mouth above 102 F (38.9 C), not controlled by medicine.  Chest, back, or muscle pain.  People around you feel you are not acting correctly or are confused.  Shortness of breath or difficulty breathing.  Dizziness and fainting.  You get a rash or develop hives.  You have a decrease in urine output.  Your urine turns a dark color or changes to pink, red, or brown. Any of the following   symptoms occur over the next 10 days:  You have a temperature by mouth above 102 F (38.9 C), not controlled by medicine.  Shortness of breath.  Weakness after normal activity.  The white part of the eye turns yellow (jaundice).  You have a decrease in the amount of urine or are urinating less often.  Your  urine turns a dark color or changes to pink, red, or brown. Document Released: 04/12/2000 Document Revised: 07/08/2011 Document Reviewed: 11/30/2007 [employer] Patient Information 2014 [employer].  

## 2012-10-26 NOTE — Progress Notes (Signed)
OFFICE PROGRESS NOTE  CC  Meredith Covey, MD [address] [place] [address]  DIAGNOSIS: 79 year old female with severe and profound anemia likely due to a primary bone marrow problem. 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  #2 patient has been on Procrit every 2 weeks unfortunately this has not effective and we will discontinue this.  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  #4 Patient received 3 cycles of Decitabine chemotherapy, her third cycle was complicated by febrile neutropenia and pneumonia causing hospitalization.    CURRENT THERAPY:Supportive care  INTERVAL HISTORY: Meredith Rodriguez 79 y.o. female returns for follow up today for her AML.  She has been having weekly lab visits and requires a blood transfusion today.  She is fatigued, but otherwise denies headaches, dizziness, fevers, night sweats, unintentional weight loss, or further concerns.  A 10 point ROS is neg.   MEDICAL HISTORY: Past Medical History  Diagnosis Date  . Hypertension   . Diabetes mellitus   . Hyperkalemia   . IBS (irritable bowel syndrome)   . Hypercholesterolemia   . Cervical radiculopathy   . Anemia   . Cancer     leukemia    ALLERGIES:  is allergic to amoxicillin; cephalexin; metformin and related; phenergan; and procardia.  MEDICATIONS:  Current Outpatient Prescriptions  Medication Sig Dispense Refill  . amLODipine (NORVASC) 5 MG tablet TAKE 1 TABLET BY MOUTH DAILY.  90 tablet  3  . aspirin 81 MG tablet Take 81 mg by mouth daily.      . cholecalciferol (VITAMIN D) 1000 UNITS tablet Take 1,000 Units by mouth daily.      Marland Kitchen glucose blood (FREESTYLE LITE) test strip Test blood sugar QID or as directed by MD.  100 each  11  . insulin glargine (LANTUS SOLOSTAR) 100 UNIT/ML injection Inject 0.1 mLs (10 Units  total) into the skin daily.  5 pen  1  . Insulin Pen Needle (B-D UF III MINI PEN NEEDLES) 31G X 5 MM MISC Use with Lantus pen daily as instructed  100 each  6  . Lancets (FREESTYLE) lancets 1 each by Other route 4 (four) times daily. Use as instructed  200 each  12  . lidocaine-prilocaine (EMLA) cream Apply topically as needed.  30 g  4  . LORazepam (ATIVAN) 0.5 MG tablet       . metoprolol (LOPRESSOR) 50 MG tablet Take 1 tablet (50 mg total) by mouth 2 (two) times daily.  180 tablet  3  . mometasone (ELOCON) 0.1 % lotion Apply topically daily.  60 mL  1  . Omega-3 Fatty Acids (FISH OIL) 1000 MG CAPS Take 1,000 mg by mouth daily.       . pregabalin (LYRICA) 75 MG capsule Take 1 capsule (75 mg total) by mouth 2 (two) times daily.  60 capsule  0  . simvastatin (ZOCOR) 5 MG tablet Take 5 mg by mouth at bedtime.      . [DISCONTINUED] glimepiride (AMARYL) 2 MG tablet Take 1 tablet (2 mg total) by mouth every morning.  30 tablet  11   Current Facility-Administered Medications  Medication Dose Route Frequency Provider Last Rate Last Dose  . TDaP (BOOSTRIX) injection 0.5 mL  0.5 mL Intramuscular Once Meredith Covey, MD        SURGICAL HISTORY:  Past Surgical History  Procedure  Laterality Date  . Varicose veins      surgey 1959  . Tonsilectomy, adenoidectomy, bilateral myringotomy and tubes    . Esophagogastroduodenoscopy  04/01/2011    Procedure: ESOPHAGOGASTRODUODENOSCOPY (EGD);  Surgeon: Barrie Folk, MD;  Location[facility] ENDOSCOPY;  Service: Endoscopy;  Laterality: N/A;    REVIEW OF SYSTEMS:   General: fatigue (-), night sweats (+), fever (-), pain (-) Lymph: palpable nodes (-) HEENT: vision changes (-), mucositis (-), gum bleeding (-), epistaxis (-) Cardiovascular: chest pain (-), palpitations (-) Pulmonary: shortness of breath (-), dyspnea on exertion (-), cough (-), hemoptysis (-) GI:  Early satiety (-), melena (-), dysphagia (-), nausea/vomiting (-), diarrhea (-) GU: dysuria (-),  hematuria (-), incontinence (-) Musculoskeletal: joint swelling (-), joint pain (-), back pain (-) Neuro: weakness (-), numbness (-), headache (-), confusion (-) Skin: Rash (-), lesions (-), dryness (-) Psych: depression (-), suicidal/homicidal ideation (-), feeling of hopelessness (-)   PHYSICAL EXAMINATION: BP 129/62  Pulse 77  Temp(Src) 98.3 F (36.8 C) (Oral)  Resp 20  Ht 5\' 4"  (1.626 m)  Wt 106 lb 9.6 oz (48.353 kg)  BMI 18.29 kg/m2 General: Patient is a well appearing female in no acute distress HEENT: PERRLA, sclerae anicteric no conjunctival pallor, MMM no erythema or exudate, right nare with large blood clot Neck: supple, no lymphadenopathy Lungs: clear to auscultation bilaterally, no wheezes, rhonchi, or rales Cardiovascular: regular rate rhythm, S1, S2, no murmurs, rubs or gallops Abdomen: Soft, non-tender, non-distended, normoactive bowel sounds, no HSM Extremities: warm and well perfused, no clubbing, cyanosis, or edema Skin: No rashes.    Neuro: Non-focal ECOG PERFORMANCE STATUS: 1  LABORATORY DATA: Lab Results  Component Value Date   WBC 6.4 10/26/2012   HGB 7.2* 10/26/2012   HCT 22.4* 10/26/2012   MCV 88.2 10/26/2012   PLT 341 Large & giant platelets 10/26/2012      Chemistry      Component Value Date/Time   NA 136 10/26/2012 1247   NA 138 05/25/2012 0550   K 4.3 10/26/2012 1247   K 3.9 05/25/2012 0550   CL 99 06/01/2012 1028   CL 100 05/25/2012 0550   CO2 31* 10/26/2012 1247   CO2 33* 05/25/2012 0550   BUN 17.5 10/26/2012 1247   BUN 16 05/25/2012 0550   CREATININE 0.7 10/26/2012 1247   CREATININE 0.54 05/25/2012 0550   CREATININE 0.69 07/18/2010 1202   GLU 215* 02/26/2012 1255      Component Value Date/Time   CALCIUM 9.5 10/26/2012 1247   CALCIUM 9.0 05/25/2012 0550   ALKPHOS 51 10/26/2012 1247   ALKPHOS 42 05/18/2012 0955   AST 28 10/26/2012 1247   AST 16 05/18/2012 0955   ALT 34 10/26/2012 1247   ALT 19 05/18/2012 0955   BILITOT 0.67 10/26/2012 1247   BILITOT  1.1 05/18/2012 0955       RADIOGRAPHIC STUDIES:  No results found.  ASSESSMENT: 79 year old female with:  1.  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  2. 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 This is discussed with the daughter and the patient today recommendations were made as below   #3 patient and I and her daughters had an extensive discussion today about the likely transformation to acute leukemia. We discussed the process of chemotherapy, risks, benefits, and potential adverse effects of the drug.  Through much conversation between myself, the patient, and her children, the  patient has decided to proceed with Decitabine chemotherapy.  Her chemotherapy has been discontinued due to her complication of febrile neutropenia and pneumonia.  Much more chemotherapy could take her life, therefore, we will continue with supportive care only.    #5 Anemia  PLAN:  #1 Ms. Glantz is here for her monthly visit.  Her labs have again done well this month.  She has continues to need a blood transfusion every 3-4 weeks.  She will receive one unit today and one unit tomorrow.      #2 Ms. Klebba will return for weekly lab visits, and we will see her back in 4 weeks.    All questions were answered. The patient knows to call the clinic with any problems, questions or concerns. We can certainly see the patient much sooner if necessary.  I spent 25 minutes counseling the patient face to face. The total time spent in the appointment was 30 minutes.  This case was reviewed with Dr. Welton Flakes.    Cherie Ouch Lyn Hollingshead, NP Medical Oncology [facility] Phone: [phone] 10/26/2012, 2:04 PM

## 2012-10-26 NOTE — Patient Instructions (Addendum)
Blood Transfusion Information WHAT IS A BLOOD TRANSFUSION? A transfusion is the replacement of blood or some of its parts. Blood is made up of multiple cells which provide different functions.  Red blood cells carry oxygen and are used for blood loss replacement.  White blood cells fight against infection.  Platelets control bleeding.  Plasma helps clot blood.  Other blood products are available for specialized needs, such as hemophilia or other clotting disorders. BEFORE THE TRANSFUSION  Who gives blood for transfusions?   You may be able to donate blood to be used at a later date on yourself (autologous donation).  Relatives can be asked to donate blood. This is generally not any safer than if you have received blood from a stranger. The same precautions are taken to ensure safety when a relative's blood is donated.  Healthy volunteers who are fully evaluated to make sure their blood is safe. This is blood bank blood. Transfusion therapy is the safest it has ever been in the practice of medicine. Before blood is taken from a donor, a complete history is taken to make sure that person has no history of diseases nor engages in risky social behavior (examples are intravenous drug use or sexual activity with multiple partners). The donor's travel history is screened to minimize risk of transmitting infections, such as malaria. The donated blood is tested for signs of infectious diseases, such as HIV and hepatitis. The blood is then tested to be sure it is compatible with you in order to minimize the chance of a transfusion reaction. If you or a relative donates blood, this is often done in anticipation of surgery and is not appropriate for emergency situations. It takes many days to process the donated blood. RISKS AND COMPLICATIONS Although transfusion therapy is very safe and saves many lives, the main dangers of transfusion include:   Getting an infectious disease.  Developing a  transfusion reaction. This is an allergic reaction to something in the blood you were given. Every precaution is taken to prevent this. The decision to have a blood transfusion has been considered carefully by your caregiver before blood is given. Blood is not given unless the benefits outweigh the risks. AFTER THE TRANSFUSION  Right after receiving a blood transfusion, you will usually feel much better and more energetic. This is especially true if your red blood cells have gotten low (anemic). The transfusion raises the level of the red blood cells which carry oxygen, and this usually causes an energy increase.  The nurse administering the transfusion will monitor you carefully for complications. HOME CARE INSTRUCTIONS  No special instructions are needed after a transfusion. You may find your energy is better. Speak with your caregiver about any limitations on activity for underlying diseases you may have. SEEK MEDICAL CARE IF:   Your condition is not improving after your transfusion.  You develop redness or irritation at the intravenous (IV) site. SEEK IMMEDIATE MEDICAL CARE IF:  Any of the following symptoms occur over the next 12 hours:  Shaking chills.  You have a temperature by mouth above 102 F (38.9 C), not controlled by medicine.  Chest, back, or muscle pain.  People around you feel you are not acting correctly or are confused.  Shortness of breath or difficulty breathing.  Dizziness and fainting.  You get a rash or develop hives.  You have a decrease in urine output.  Your urine turns a dark color or changes to pink, red, or brown. Any of the following   symptoms occur over the next 10 days:  You have a temperature by mouth above 102 F (38.9 C), not controlled by medicine.  Shortness of breath.  Weakness after normal activity.  The white part of the eye turns yellow (jaundice).  You have a decrease in the amount of urine or are urinating less often.  Your  urine turns a dark color or changes to pink, red, or brown. Document Released: 04/12/2000 Document Revised: 07/08/2011 Document Reviewed: 11/30/2007 [employer] Patient Information 2014 [employer].  

## 2012-10-27 ENCOUNTER — Other Ambulatory Visit: Payer: Self-pay | Admitting: Emergency Medicine

## 2012-10-27 ENCOUNTER — Ambulatory Visit (HOSPITAL_BASED_OUTPATIENT_CLINIC_OR_DEPARTMENT_OTHER): Payer: Medicare Other

## 2012-10-27 VITALS — BP 152/67 | HR 70 | Temp 96.8°F | Resp 18

## 2012-10-27 DIAGNOSIS — C9200 Acute myeloblastic leukemia, not having achieved remission: Secondary | ICD-10-CM

## 2012-10-27 DIAGNOSIS — D4620 Refractory anemia with excess of blasts, unspecified: Secondary | ICD-10-CM

## 2012-10-27 DIAGNOSIS — D638 Anemia in other chronic diseases classified elsewhere: Secondary | ICD-10-CM

## 2012-10-27 MED ORDER — ACETAMINOPHEN 325 MG PO TABS
650.0000 mg | ORAL_TABLET | Freq: Once | ORAL | Status: AC
  Administered 2012-10-27: 650 mg via ORAL

## 2012-10-27 MED ORDER — SODIUM CHLORIDE 0.9 % IJ SOLN
10.0000 mL | INTRAMUSCULAR | Status: AC | PRN
  Administered 2012-10-27: 10 mL
  Filled 2012-10-27: qty 10

## 2012-10-27 MED ORDER — DIPHENHYDRAMINE HCL 25 MG PO CAPS
25.0000 mg | ORAL_CAPSULE | Freq: Once | ORAL | Status: AC
  Administered 2012-10-27: 25 mg via ORAL

## 2012-10-27 MED ORDER — HEPARIN SOD (PORK) LOCK FLUSH 100 UNIT/ML IV SOLN
500.0000 [IU] | Freq: Every day | INTRAVENOUS | Status: AC | PRN
  Administered 2012-10-27: 500 [IU]
  Filled 2012-10-27: qty 5

## 2012-10-27 MED ORDER — ACETAMINOPHEN 325 MG PO TABS: 650.0000 mg | ORAL_TABLET | Freq: Once | ORAL | Status: DC

## 2012-10-27 MED ORDER — SODIUM CHLORIDE 0.9 % IV SOLN
250.0000 mL | Freq: Once | INTRAVENOUS | Status: AC
  Administered 2012-10-27: 250 mL via INTRAVENOUS

## 2012-10-27 MED ORDER — DIPHENHYDRAMINE HCL 25 MG PO CAPS: 25.0000 mg | ORAL_CAPSULE | Freq: Once | ORAL | Status: DC

## 2012-10-27 NOTE — Patient Instructions (Addendum)
Blood Transfusion Information WHAT IS A BLOOD TRANSFUSION? A transfusion is the replacement of blood or some of its parts. Blood is made up of multiple cells which provide different functions.  Red blood cells carry oxygen and are used for blood loss replacement.  White blood cells fight against infection.  Platelets control bleeding.  Plasma helps clot blood.  Other blood products are available for specialized needs, such as hemophilia or other clotting disorders. BEFORE THE TRANSFUSION  Who gives blood for transfusions?   You may be able to donate blood to be used at a later date on yourself (autologous donation).  Relatives can be asked to donate blood. This is generally not any safer than if you have received blood from a stranger. The same precautions are taken to ensure safety when a relative's blood is donated.  Healthy volunteers who are fully evaluated to make sure their blood is safe. This is blood bank blood. Transfusion therapy is the safest it has ever been in the practice of medicine. Before blood is taken from a donor, a complete history is taken to make sure that person has no history of diseases nor engages in risky social behavior (examples are intravenous drug use or sexual activity with multiple partners). The donor's travel history is screened to minimize risk of transmitting infections, such as malaria. The donated blood is tested for signs of infectious diseases, such as HIV and hepatitis. The blood is then tested to be sure it is compatible with you in order to minimize the chance of a transfusion reaction. If you or a relative donates blood, this is often done in anticipation of surgery and is not appropriate for emergency situations. It takes many days to process the donated blood. RISKS AND COMPLICATIONS Although transfusion therapy is very safe and saves many lives, the main dangers of transfusion include:   Getting an infectious disease.  Developing a  transfusion reaction. This is an allergic reaction to something in the blood you were given. Every precaution is taken to prevent this. The decision to have a blood transfusion has been considered carefully by your caregiver before blood is given. Blood is not given unless the benefits outweigh the risks. AFTER THE TRANSFUSION  Right after receiving a blood transfusion, you will usually feel much better and more energetic. This is especially true if your red blood cells have gotten low (anemic). The transfusion raises the level of the red blood cells which carry oxygen, and this usually causes an energy increase.  The nurse administering the transfusion will monitor you carefully for complications. HOME CARE INSTRUCTIONS  No special instructions are needed after a transfusion. You may find your energy is better. Speak with your caregiver about any limitations on activity for underlying diseases you may have. SEEK MEDICAL CARE IF:   Your condition is not improving after your transfusion.  You develop redness or irritation at the intravenous (IV) site. SEEK IMMEDIATE MEDICAL CARE IF:  Any of the following symptoms occur over the next 12 hours:  Shaking chills.  You have a temperature by mouth above 102 F (38.9 C), not controlled by medicine.  Chest, back, or muscle pain.  People around you feel you are not acting correctly or are confused.  Shortness of breath or difficulty breathing.  Dizziness and fainting.  You get a rash or develop hives.  You have a decrease in urine output.  Your urine turns a dark color or changes to pink, red, or brown. Any of the following   symptoms occur over the next 10 days:  You have a temperature by mouth above 102 F (38.9 C), not controlled by medicine.  Shortness of breath.  Weakness after normal activity.  The white part of the eye turns yellow (jaundice).  You have a decrease in the amount of urine or are urinating less often.  Your  urine turns a dark color or changes to pink, red, or brown. Document Released: 04/12/2000 Document Revised: 07/08/2011 Document Reviewed: 11/30/2007 [employer] Patient Information 2014 [employer].  

## 2012-10-28 ENCOUNTER — Other Ambulatory Visit: Payer: Self-pay | Admitting: Family Medicine

## 2012-10-28 ENCOUNTER — Encounter (HOSPITAL_COMMUNITY)
Admission: RE | Admit: 2012-10-28 | Discharge: 2012-10-28 | Disposition: A | Discharge status: Home / Self Care | Payer: Medicare Other | Source: Ambulatory Visit | Attending: Oncology | Admitting: Oncology

## 2012-10-28 DIAGNOSIS — C9200 Acute myeloblastic leukemia, not having achieved remission: Secondary | ICD-10-CM | POA: Insufficient documentation

## 2012-10-28 LAB — TYPE AND SCREEN
ABO/RH(D): AB NEG
Antibody Screen: NEGATIVE
Donor AG Type: NEGATIVE
Donor AG Type: NEGATIVE
Unit division: 0

## 2012-10-29 ENCOUNTER — Encounter: Payer: Self-pay | Admitting: Family Medicine

## 2012-10-29 ENCOUNTER — Ambulatory Visit (INDEPENDENT_AMBULATORY_CARE_PROVIDER_SITE_OTHER): Payer: Medicare Other | Admitting: Family Medicine

## 2012-10-29 VITALS — BP 132/64 | HR 74 | Temp 98.2°F | Ht 65.0 in | Wt 107.0 lb

## 2012-10-29 DIAGNOSIS — E785 Hyperlipidemia, unspecified: Secondary | ICD-10-CM

## 2012-10-29 DIAGNOSIS — E119 Type 2 diabetes mellitus without complications: Secondary | ICD-10-CM

## 2012-10-29 DIAGNOSIS — E1165 Type 2 diabetes mellitus with hyperglycemia: Secondary | ICD-10-CM | POA: Insufficient documentation

## 2012-10-29 LAB — LIPID PANEL
Total CHOL/HDL Ratio: 3
Triglycerides: 149 mg/dL (ref 0.0–149.0)

## 2012-10-29 NOTE — Progress Notes (Signed)
  Subjective:    Patient ID: Meredith Rodriguez, female    DOB: Jun 14, 1925, 79 y.o.   MRN: [id number]  HPI Medical followup Patient has history of hypertension, hyperlipidemia, type 2 diabetes, chronic anemia with ?myelodysplastic syndrome and possible acute myeloid leukemia. Followed closely by hematology. Recent transfusion earlier this week.  She has some chronic malaise which is unchanged. No dyspnea. Blood sugars stable by home readings. She forgot to bring in readings today. Last A1c 8.9%. We added Lantus 10 units once daily at that time. Patient takes Humalog 3 times daily sliding scale with meals. Hyperlipidemia treated with simvastatin once daily. Last lipids were over one year ago  Appetite is stable. She has gained 3 pounds since last visit.  Past Medical History  Diagnosis Date  . Hypertension   . Diabetes mellitus   . Hyperkalemia   . IBS (irritable bowel syndrome)   . Hypercholesterolemia   . Cervical radiculopathy   . Anemia   . Cancer     leukemia   Past Surgical History  Procedure Laterality Date  . Varicose veins      surgey 1959  . Tonsilectomy, adenoidectomy, bilateral myringotomy and tubes    . Esophagogastroduodenoscopy  04/01/2011    Procedure: ESOPHAGOGASTRODUODENOSCOPY (EGD);  Surgeon: Barrie Folk, MD;  Location: [facility] ENDOSCOPY;  Service: Endoscopy;  Laterality: N/A;    reports that she has never smoked. She does not have any smokeless tobacco history on file. She reports that she does not drink alcohol or use illicit drugs. family history includes Cancer in her daughter and sisters; Diabetes in her sister; Hypertension in her father; and Stroke in her mother. Allergies  Allergen Reactions  . Amoxicillin (Amoxicillin)     Upset stomach  . Cephalexin Nausea Only  . Metformin And Related     Gi problems  . Phenergan (Promethazine Hcl) Other (See Comments)    Unknown   . Procardia (Nifedipine)     dizziness      Review of Systems  Constitutional:  Negative for fever, chills, appetite change and unexpected weight change.  Respiratory: Negative for shortness of breath.   Cardiovascular: Negative for chest pain and leg swelling.  Gastrointestinal: Negative for nausea, vomiting, abdominal pain, diarrhea and constipation.  Endocrine: Negative for polydipsia and polyuria.  Genitourinary: Negative for dysuria.       Objective:   Physical Exam  Constitutional: She appears well-developed and well-nourished. No distress.  HENT:  Mouth/Throat: Oropharynx is clear and moist.  Neck: Neck supple. No thyromegaly present.  Cardiovascular: Normal rate and regular rhythm.   Pulmonary/Chest: Effort normal and breath sounds normal. No respiratory distress. She has no wheezes. She has no rales.  Musculoskeletal: She exhibits no edema.  Skin:  Feet reveal no skin lesions. Good distal foot pulses. Good capillary refill. No calluses. Normal sensation with monofilament testing           Assessment & Plan:  #1 type 2 diabetes. History of poor control. Recheck A1c. Given her age and comorbidities, we would not aim for extremely tight control. #2 hyperlipidemia. Check lipid panel. Recent hepatic function per oncology so this was not repeated. #3 hypertension well controlled. No recent orthostasis. Continue amlodipine and metoprolol

## 2012-11-02 ENCOUNTER — Other Ambulatory Visit (HOSPITAL_BASED_OUTPATIENT_CLINIC_OR_DEPARTMENT_OTHER): Payer: Medicare Other

## 2012-11-02 DIAGNOSIS — C9200 Acute myeloblastic leukemia, not having achieved remission: Secondary | ICD-10-CM

## 2012-11-02 LAB — CBC WITH DIFFERENTIAL/PLATELET
Basophils Absolute: 0 10*3/uL (ref 0.0–0.1)
HCT: 31.1 % — ABNORMAL LOW (ref 34.8–46.6)
HGB: 10 g/dL — ABNORMAL LOW (ref 11.6–15.9)
MONO#: 0 10*3/uL — ABNORMAL LOW (ref 0.1–0.9)
NEUT#: 3.5 10*3/uL (ref 1.5–6.5)
NEUT%: 79.1 % — ABNORMAL HIGH (ref 38.4–76.8)
WBC: 4.4 10*3/uL (ref 3.9–10.3)
lymph#: 0.8 10*3/uL — ABNORMAL LOW (ref 0.9–3.3)

## 2012-11-09 ENCOUNTER — Telehealth: Payer: Self-pay | Admitting: *Deleted

## 2012-11-09 ENCOUNTER — Other Ambulatory Visit: Payer: Self-pay | Admitting: Emergency Medicine

## 2012-11-09 ENCOUNTER — Other Ambulatory Visit (HOSPITAL_BASED_OUTPATIENT_CLINIC_OR_DEPARTMENT_OTHER): Payer: Medicare Other

## 2012-11-09 DIAGNOSIS — C9200 Acute myeloblastic leukemia, not having achieved remission: Secondary | ICD-10-CM

## 2012-11-09 LAB — CBC WITH DIFFERENTIAL/PLATELET
Basophils Absolute: 0 10*3/uL (ref 0.0–0.1)
Eosinophils Absolute: 0.1 10*3/uL (ref 0.0–0.5)
HCT: 27.8 % — ABNORMAL LOW (ref 34.8–46.6)
LYMPH%: 18.3 % (ref 14.0–49.7)
MCV: 89.1 fL (ref 79.5–101.0)
MONO%: 0.4 % (ref 0.0–14.0)
NEUT#: 4 10*3/uL (ref 1.5–6.5)
NEUT%: 79.9 % — ABNORMAL HIGH (ref 38.4–76.8)
Platelets: 296 10*3/uL (ref 145–400)
RBC: 3.12 10*6/uL — ABNORMAL LOW (ref 3.70–5.45)
nRBC: 0 % (ref 0–0)

## 2012-11-09 NOTE — Telephone Encounter (Signed)
PEr staff message from desk RN I have scheduled appt for 7/21.  JMW

## 2012-11-09 NOTE — Progress Notes (Signed)
Patient here for labs; hgb 8.9. Patient states she feels ok and doesn't feel like she needs a transfusion this week. Instructed patient to call with any changes or concerns. Will schedule 2 unit PRBC transfusion for 7/21 with next lab appointment. Patient and family verbalized understanding.

## 2012-11-10 ENCOUNTER — Other Ambulatory Visit: Payer: Self-pay | Admitting: Emergency Medicine

## 2012-11-10 ENCOUNTER — Telehealth: Payer: Self-pay | Admitting: Oncology

## 2012-11-10 NOTE — Telephone Encounter (Signed)
S/w to confirm appt for 7/21. Other appts remain the same.

## 2012-11-13 ENCOUNTER — Other Ambulatory Visit: Payer: Self-pay | Admitting: Emergency Medicine

## 2012-11-13 DIAGNOSIS — C9200 Acute myeloblastic leukemia, not having achieved remission: Secondary | ICD-10-CM

## 2012-11-16 ENCOUNTER — Other Ambulatory Visit (HOSPITAL_BASED_OUTPATIENT_CLINIC_OR_DEPARTMENT_OTHER): Payer: Medicare Other

## 2012-11-16 ENCOUNTER — Other Ambulatory Visit: Payer: Self-pay

## 2012-11-16 ENCOUNTER — Ambulatory Visit (HOSPITAL_BASED_OUTPATIENT_CLINIC_OR_DEPARTMENT_OTHER): Payer: Medicare Other

## 2012-11-16 VITALS — BP 154/41 | HR 70 | Temp 97.5°F | Resp 18

## 2012-11-16 DIAGNOSIS — C9200 Acute myeloblastic leukemia, not having achieved remission: Secondary | ICD-10-CM

## 2012-11-16 LAB — CBC WITH DIFFERENTIAL/PLATELET
Basophils Absolute: 0 10*3/uL (ref 0.0–0.1)
Eosinophils Absolute: 0.1 10*3/uL (ref 0.0–0.5)
HGB: 7.6 g/dL — ABNORMAL LOW (ref 11.6–15.9)
MONO#: 0 10*3/uL — ABNORMAL LOW (ref 0.1–0.9)
MONO%: 0.2 % (ref 0.0–14.0)
NEUT#: 4.4 10*3/uL (ref 1.5–6.5)
RBC: 2.7 10*6/uL — ABNORMAL LOW (ref 3.70–5.45)
RDW: 15 % — ABNORMAL HIGH (ref 11.2–14.5)
WBC: 5.2 10*3/uL (ref 3.9–10.3)
lymph#: 0.7 10*3/uL — ABNORMAL LOW (ref 0.9–3.3)
nRBC: 0 % (ref 0–0)

## 2012-11-16 LAB — PREPARE RBC (CROSSMATCH)

## 2012-11-16 LAB — HOLD TUBE, BLOOD BANK

## 2012-11-16 MED ORDER — SODIUM CHLORIDE 0.9 % IV SOLN
250.0000 mL | Freq: Once | INTRAVENOUS | Status: AC
  Administered 2012-11-16: 250 mL via INTRAVENOUS

## 2012-11-16 MED ORDER — DIPHENHYDRAMINE HCL 25 MG PO CAPS
25.0000 mg | ORAL_CAPSULE | Freq: Once | ORAL | Status: AC
  Administered 2012-11-16: 25 mg via ORAL

## 2012-11-16 MED ORDER — ACETAMINOPHEN 325 MG PO TABS
650.0000 mg | ORAL_TABLET | Freq: Once | ORAL | Status: AC
  Administered 2012-11-16: 650 mg via ORAL

## 2012-11-16 MED ORDER — HEPARIN SOD (PORK) LOCK FLUSH 100 UNIT/ML IV SOLN
500.0000 [IU] | Freq: Every day | INTRAVENOUS | Status: AC | PRN
  Administered 2012-11-16: 500 [IU]
  Filled 2012-11-16: qty 5

## 2012-11-16 MED ORDER — SODIUM CHLORIDE 0.9 % IJ SOLN
10.0000 mL | INTRAMUSCULAR | Status: AC | PRN
  Administered 2012-11-16: 10 mL
  Filled 2012-11-16: qty 10

## 2012-11-16 NOTE — Patient Instructions (Addendum)
Blood Transfusion Information WHAT IS A BLOOD TRANSFUSION? A transfusion is the replacement of blood or some of its parts. Blood is made up of multiple cells which provide different functions.  Red blood cells carry oxygen and are used for blood loss replacement.  White blood cells fight against infection.  Platelets control bleeding.  Plasma helps clot blood.  Other blood products are available for specialized needs, such as hemophilia or other clotting disorders. BEFORE THE TRANSFUSION  Who gives blood for transfusions?   You may be able to donate blood to be used at a later date on yourself (autologous donation).  Relatives can be asked to donate blood. This is generally not any safer than if you have received blood from a stranger. The same precautions are taken to ensure safety when a relative's blood is donated.  Healthy volunteers who are fully evaluated to make sure their blood is safe. This is blood bank blood. Transfusion therapy is the safest it has ever been in the practice of medicine. Before blood is taken from a donor, a complete history is taken to make sure that person has no history of diseases nor engages in risky social behavior (examples are intravenous drug use or sexual activity with multiple partners). The donor's travel history is screened to minimize risk of transmitting infections, such as malaria. The donated blood is tested for signs of infectious diseases, such as HIV and hepatitis. The blood is then tested to be sure it is compatible with you in order to minimize the chance of a transfusion reaction. If you or a relative donates blood, this is often done in anticipation of surgery and is not appropriate for emergency situations. It takes many days to process the donated blood. RISKS AND COMPLICATIONS Although transfusion therapy is very safe and saves many lives, the main dangers of transfusion include:   Getting an infectious disease.  Developing a  transfusion reaction. This is an allergic reaction to something in the blood you were given. Every precaution is taken to prevent this. The decision to have a blood transfusion has been considered carefully by your caregiver before blood is given. Blood is not given unless the benefits outweigh the risks. AFTER THE TRANSFUSION  Right after receiving a blood transfusion, you will usually feel much better and more energetic. This is especially true if your red blood cells have gotten low (anemic). The transfusion raises the level of the red blood cells which carry oxygen, and this usually causes an energy increase.  The nurse administering the transfusion will monitor you carefully for complications. HOME CARE INSTRUCTIONS  No special instructions are needed after a transfusion. You may find your energy is better. Speak with your caregiver about any limitations on activity for underlying diseases you may have. SEEK MEDICAL CARE IF:   Your condition is not improving after your transfusion.  You develop redness or irritation at the intravenous (IV) site. SEEK IMMEDIATE MEDICAL CARE IF:  Any of the following symptoms occur over the next 12 hours:  Shaking chills.  You have a temperature by mouth above 102 F (38.9 C), not controlled by medicine.  Chest, back, or muscle pain.  People around you feel you are not acting correctly or are confused.  Shortness of breath or difficulty breathing.  Dizziness and fainting.  You get a rash or develop hives.  You have a decrease in urine output.  Your urine turns a dark color or changes to pink, red, or brown. Any of the following   symptoms occur over the next 10 days:  You have a temperature by mouth above 102 F (38.9 C), not controlled by medicine.  Shortness of breath.  Weakness after normal activity.  The white part of the eye turns yellow (jaundice).  You have a decrease in the amount of urine or are urinating less often.  Your  urine turns a dark color or changes to pink, red, or brown. Document Released: 04/12/2000 Document Revised: 07/08/2011 Document Reviewed: 11/30/2007 [employer] Patient Information 2014 [employer].  

## 2012-11-17 LAB — TYPE AND SCREEN
ABO/RH(D): AB NEG
Antibody Screen: NEGATIVE
Unit division: 0
Unit division: 0

## 2012-11-23 ENCOUNTER — Ambulatory Visit: Payer: Medicare Other | Admitting: Oncology

## 2012-11-23 ENCOUNTER — Ambulatory Visit: Payer: Medicare Other | Admitting: Adult Health

## 2012-11-23 ENCOUNTER — Other Ambulatory Visit (HOSPITAL_BASED_OUTPATIENT_CLINIC_OR_DEPARTMENT_OTHER): Payer: Medicare Other

## 2012-11-23 ENCOUNTER — Telehealth: Payer: Self-pay | Admitting: *Deleted

## 2012-11-23 ENCOUNTER — Ambulatory Visit (HOSPITAL_BASED_OUTPATIENT_CLINIC_OR_DEPARTMENT_OTHER): Payer: Medicare Other | Admitting: Adult Health

## 2012-11-23 ENCOUNTER — Encounter: Payer: Self-pay | Admitting: Adult Health

## 2012-11-23 ENCOUNTER — Telehealth: Payer: Self-pay | Admitting: Family Medicine

## 2012-11-23 VITALS — BP 174/63 | HR 72 | Temp 98.0°F | Resp 20 | Ht 65.0 in | Wt 106.3 lb

## 2012-11-23 DIAGNOSIS — C9200 Acute myeloblastic leukemia, not having achieved remission: Secondary | ICD-10-CM

## 2012-11-23 DIAGNOSIS — D630 Anemia in neoplastic disease: Secondary | ICD-10-CM

## 2012-11-23 LAB — CBC WITH DIFFERENTIAL/PLATELET
Basophils Absolute: 0 10*3/uL (ref 0.0–0.1)
Eosinophils Absolute: 0.1 10*3/uL (ref 0.0–0.5)
HCT: 33.6 % — ABNORMAL LOW (ref 34.8–46.6)
HGB: 10.9 g/dL — ABNORMAL LOW (ref 11.6–15.9)
LYMPH%: 16.3 % (ref 14.0–49.7)
MONO#: 0 10*3/uL — ABNORMAL LOW (ref 0.1–0.9)
NEUT#: 3.7 10*3/uL (ref 1.5–6.5)
Platelets: 186 10*3/uL (ref 145–400)
RBC: 3.73 10*6/uL (ref 3.70–5.45)
WBC: 4.6 10*3/uL (ref 3.9–10.3)
nRBC: 0 % (ref 0–0)

## 2012-11-23 LAB — COMPREHENSIVE METABOLIC PANEL (CC13)
ALT: 31 U/L (ref 0–55)
AST: 27 U/L (ref 5–34)
Albumin: 3.5 g/dL (ref 3.5–5.0)
BUN: 16.2 mg/dL (ref 7.0–26.0)
CO2: 30 mEq/L — ABNORMAL HIGH (ref 22–29)
Calcium: 9.4 mg/dL (ref 8.4–10.4)
Chloride: 101 mEq/L (ref 98–109)
Potassium: 5 mEq/L (ref 3.5–5.1)

## 2012-11-23 NOTE — Telephone Encounter (Signed)
appts made and printed. Pt is aware that i am waiting on a time slot for kk for 01/25/13....td

## 2012-11-23 NOTE — Telephone Encounter (Signed)
Patient Information:  Caller Name: Mckenzy  Phone: [phone]  Patient: Meredith Rodriguez, [place]  Gender: Female  DOB: June 22, 1925  Age: 79 Years  PCP: Evelena Peat (Family Practice)  Office Follow Up:  Does the office need to follow up with this patient?: No  Instructions For The Office: N/A  RN Note:  Pt has Leukema, went for treatment on 7-28, was advised her BP was elevated, 170-65, MD noticed bite mark on inside Right Arm, size of dime, no redness spreading, pain or itching. All emergent symptoms ruled out per High Blood pressure protocol.  Advised Pt to monitor BP and call back if continues to be 20 points above her baseline or anywhere close to 160/100.  Pt verbalied understanding.  Symptoms  Reason For Call & Symptoms: Elevated BP and Insect Bite  Reviewed Health History In EMR: Yes  Reviewed Medications In EMR: Yes  Reviewed Allergies In EMR: Yes  Reviewed Surgeries / Procedures: Yes  Date of Onset of Symptoms: 11/23/2012  Treatments Tried: Amlodipine, Metoprolol  Treatments Tried Worked: No  Guideline(s) Used:  High Blood Pressure  Disposition Per Guideline:   See Within 2 Weeks in Office  Reason For Disposition Reached:   BP > 160/100  Advice Given:  N/A  Patient Will Follow Care Advice:  YES

## 2012-11-23 NOTE — Telephone Encounter (Signed)
FYI

## 2012-11-23 NOTE — Telephone Encounter (Signed)
sw pt informed her of the time slot that was given for 01/25/13. gv labs @ 8:15am and ov @8 :45am for 01/25/13..the patient is aware...td

## 2012-11-23 NOTE — Patient Instructions (Addendum)
Doing well.  Labs have improved greatly since blood transfusion.  Labs are stable.  Contact your primary care physician about your elevated blood pressure today.  Please call us if you have any questions or concerns.

## 2012-11-23 NOTE — Addendum Note (Signed)
Addended by: Augustin Schooling C on: 11/23/2012 09:46 AM   Modules accepted: Orders

## 2012-11-23 NOTE — Telephone Encounter (Signed)
Pt stated that she is feeling okay.

## 2012-11-23 NOTE — Progress Notes (Signed)
OFFICE PROGRESS NOTE  CC  [name], MD [address] [place] [address]  DIAGNOSIS: 79 year old female with severe and profound anemia likely due to a primary bone marrow problem. 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  #2 patient has been on Procrit every 2 weeks unfortunately this has not effective and we will discontinue this.  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  #4 Patient received 3 cycles of Decitabine chemotherapy, her third cycle was complicated by febrile neutropenia and pneumonia causing hospitalization.    CURRENT THERAPY:Supportive care  INTERVAL HISTORY: Meredith Rodriguez 79 y.o. female returns for follow up today for her AML.  She has been having weekly lab visits and required a blood transfusion last week.  She denies fevers, chills, night sweats, unintentional weight loss, fatigue, nausea, vomiting, headaches, blurred vision, pain, or any further concerns.  A 10 point ROS is negative.    MEDICAL HISTORY: Past Medical History  Diagnosis Date  . Hypertension   . Diabetes mellitus   . Hyperkalemia   . IBS (irritable bowel syndrome)   . Hypercholesterolemia   . Cervical radiculopathy   . Anemia   . Cancer     leukemia    ALLERGIES:  is allergic to amoxicillin; cephalexin; metformin and related; phenergan; and procardia.  MEDICATIONS:  Current Outpatient Prescriptions  Medication Sig Dispense Refill  . amLODipine (NORVASC) 5 MG tablet TAKE 1 TABLET BY MOUTH DAILY.  90 tablet  3  . aspirin 81 MG tablet Take 81 mg by mouth daily.      . cholecalciferol (VITAMIN D) 1000 UNITS tablet Take 1,000 Units by mouth daily.      Marland Kitchen glucose blood (FREESTYLE LITE) test strip Test blood sugar QID or as directed by MD.  100 each  11  . insulin glargine (LANTUS SOLOSTAR) 100 UNIT/ML  injection Inject 0.1 mLs (10 Units total) into the skin daily.  5 pen  1  . Insulin Pen Needle (B-D UF III MINI PEN NEEDLES) 31G X 5 MM MISC Use with Lantus pen daily as instructed  100 each  6  . Lancets (FREESTYLE) lancets 1 each by Other route 4 (four) times daily. Use as instructed  200 each  12  . lidocaine-prilocaine (EMLA) cream Apply topically as needed.  30 g  4  . LORazepam (ATIVAN) 0.5 MG tablet       . metoprolol (LOPRESSOR) 50 MG tablet Take 1 tablet (50 mg total) by mouth 2 (two) times daily.  180 tablet  3  . mometasone (ELOCON) 0.1 % lotion Apply topically daily.  60 mL  1  . Omega-3 Fatty Acids (FISH OIL) 1000 MG CAPS Take 1,000 mg by mouth daily.       . pregabalin (LYRICA) 75 MG capsule Take 1 capsule (75 mg total) by mouth 2 (two) times daily.  60 capsule  0  . simvastatin (ZOCOR) 5 MG tablet Take 5 mg by mouth at bedtime.      . simvastatin (ZOCOR) 5 MG tablet TAKE 1 TABLET BY MOUTH AT BEDTIME  90 tablet  3  . [DISCONTINUED] glimepiride (AMARYL) 2 MG tablet Take 1 tablet (2 mg total) by mouth every morning.  30 tablet  11   Current Facility-Administered Medications  Medication Dose Route Frequency Provider Last Rate Last Dose  . TDaP (BOOSTRIX) injection 0.5 mL  0.5 mL Intramuscular Once [name], MD        SURGICAL HISTORY:  Past Surgical History  Procedure Laterality Date  . Varicose veins      surgey 1959  . Tonsilectomy, adenoidectomy, bilateral myringotomy and tubes    . Esophagogastroduodenoscopy  04/01/2011    Procedure: ESOPHAGOGASTRODUODENOSCOPY (EGD);  Surgeon: Barrie Folk, MD;  Location: [facility] ENDOSCOPY;  Service: Endoscopy;  Laterality: N/A;    REVIEW OF SYSTEMS:   General: fatigue (-), night sweats (+), fever (-), pain (-) Lymph: palpable nodes (-) HEENT: vision changes (-), mucositis (-), gum bleeding (-), epistaxis (-) Cardiovascular: chest pain (-), palpitations (-) Pulmonary: shortness of breath (-), dyspnea on exertion (-), cough (-),  hemoptysis (-) GI:  Early satiety (-), melena (-), dysphagia (-), nausea/vomiting (-), diarrhea (-) GU: dysuria (-), hematuria (-), incontinence (-) Musculoskeletal: joint swelling (-), joint pain (-), back pain (-) Neuro: weakness (-), numbness (-), headache (-), confusion (-) Skin: Rash (-), lesions (-), dryness (-) Psych: depression (-), suicidal/homicidal ideation (-), feeling of hopelessness (-)   PHYSICAL EXAMINATION: BP 174/63  Pulse 72  Temp(Src) 98 F (36.7 C) (Oral)  Resp 20  Ht 5\' 5"  (1.651 m)  Wt 106 lb 4.8 oz (48.217 kg)  BMI 17.69 kg/m2 General: Patient is a well appearing female in no acute distress HEENT: PERRLA, sclerae anicteric no conjunctival pallor, MMM no erythema or exudate, right nare with large blood clot Neck: supple, no lymphadenopathy Lungs: clear to auscultation bilaterally, no wheezes, rhonchi, or rales Cardiovascular: regular rate rhythm, S1, S2, no murmurs, rubs or gallops Abdomen: Soft, non-tender, non-distended, normoactive bowel sounds, no HSM Extremities: warm and well perfused, no clubbing, cyanosis, or edema Skin: No rashes.    Neuro: Non-focal ECOG PERFORMANCE STATUS: 1  LABORATORY DATA: Lab Results  Component Value Date   WBC 4.6 11/23/2012   HGB 10.9* 11/23/2012   HCT 33.6* 11/23/2012   MCV 90.1 11/23/2012   PLT 186 11/23/2012      Chemistry      Component Value Date/Time   NA 136 10/26/2012 1247   NA 138 05/25/2012 0550   K 4.3 10/26/2012 1247   K 3.9 05/25/2012 0550   CL 99 06/01/2012 1028   CL 100 05/25/2012 0550   CO2 31* 10/26/2012 1247   CO2 33* 05/25/2012 0550   BUN 17.5 10/26/2012 1247   BUN 16 05/25/2012 0550   CREATININE 0.7 10/26/2012 1247   CREATININE 0.54 05/25/2012 0550   CREATININE 0.69 07/18/2010 1202   GLU 215* 02/26/2012 1255      Component Value Date/Time   CALCIUM 9.5 10/26/2012 1247   CALCIUM 9.0 05/25/2012 0550   ALKPHOS 51 10/26/2012 1247   ALKPHOS 42 05/18/2012 0955   AST 28 10/26/2012 1247   AST 16 05/18/2012 0955    ALT 34 10/26/2012 1247   ALT 19 05/18/2012 0955   BILITOT 0.67 10/26/2012 1247   BILITOT 1.1 05/18/2012 0955       RADIOGRAPHIC STUDIES:  No results found.  ASSESSMENT: 79 year old female with:  1.  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  2. 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 This is discussed with the daughter and the patient today recommendations were made as below   #3 patient and I and her daughters had an extensive discussion today about the likely transformation to acute leukemia. We discussed the process of chemotherapy, risks, benefits,  and potential adverse effects of the drug.  Through much conversation between myself, the patient, and her children, the patient has decided to proceed with Decitabine chemotherapy.  Her chemotherapy has been discontinued due to her complication of febrile neutropenia and pneumonia.  Much more chemotherapy could take her life, therefore, we will continue with supportive care only.    #5 Anemia  PLAN:  #1 Ms. Brun is here for her monthly visit.  Her labs have again done well this month.  She has continues to need a blood transfusion every 3-4 weeks.  I ordered iron studies with her next set of labs.  Her daughters are very concerned about her iron levels and the possibility of iron overload.    #2 Ms. Hinde will return for weekly lab visits, and we will see her back in 4 weeks.    All questions were answered. The patient knows to call the clinic with any problems, questions or concerns. We can certainly see the patient much sooner if necessary.  I spent 25 minutes counseling the patient face to face. The total time spent in the appointment was 30 minutes.  This case was reviewed with Dr. Welton Flakes.    Cherie Ouch Lyn Hollingshead, NP Medical Oncology [facility] Phone: [phone] 11/23/2012, 9:23 AM

## 2012-11-24 ENCOUNTER — Encounter: Payer: Self-pay | Admitting: Internal Medicine

## 2012-11-24 ENCOUNTER — Ambulatory Visit (INDEPENDENT_AMBULATORY_CARE_PROVIDER_SITE_OTHER): Payer: Medicare Other | Admitting: Internal Medicine

## 2012-11-24 ENCOUNTER — Telehealth: Payer: Self-pay | Admitting: Family Medicine

## 2012-11-24 VITALS — BP 182/84 | HR 74 | Temp 98.1°F | Wt 104.0 lb

## 2012-11-24 DIAGNOSIS — I10 Essential (primary) hypertension: Secondary | ICD-10-CM

## 2012-11-24 DIAGNOSIS — R300 Dysuria: Secondary | ICD-10-CM

## 2012-11-24 LAB — POCT URINALYSIS DIPSTICK
Glucose, UA: NEGATIVE
Nitrite, UA: NEGATIVE
Urobilinogen, UA: 0.2
pH, UA: 6.5

## 2012-11-24 MED ORDER — AMLODIPINE BESYLATE 10 MG PO TABS: 10.0000 mg | ORAL_TABLET | Freq: Every day | ORAL | Status: DC

## 2012-11-24 NOTE — Assessment & Plan Note (Addendum)
Uncontrolled Will increase Norvasc to 10 mg daily  RTC in 2 weeks for BP check

## 2012-11-24 NOTE — Telephone Encounter (Signed)
Caller Name: Gianni  Phone: [phone]  Patient: Meredith Rodriguez, Apt  Gender: Female  DOB: [birth year]  Age: 79 Years  PCP: Evelena Peat [facility])   Does the office need to follow up with this patient?: Yes  Instructions For The Office: recommendations/appointment  RN Note:  red area on back of right arm, not feeling well, some nausea, decreased appetite.   Reason For Call & Symptoms: emergent call , seen by oncologist 11/23/12  BP was 174/60, now 205/87, HR 72, wants to be seen, no appt to offer  Reviewed Health History In EMR: Yes  Reviewed Medications In EMR: Yes  Reviewed Allergies In EMR: Yes  Reviewed Surgeries / Procedures: Yes  Date of Onset of Symptoms: 11/23/2012  Guideline(s) Used:  High Blood Pressure  Disposition Per Guideline:  See Today in Office  Reason For Disposition Reached:  Patient wants to be seen  Advice Given:  Call Back If:  You want to go in to the office for a blood pressure check  You become worse.  Patient Will Follow Care Advice:  YES

## 2012-11-24 NOTE — Patient Instructions (Signed)

## 2012-11-24 NOTE — Telephone Encounter (Signed)
Appointment made at our [place] office

## 2012-11-24 NOTE — Progress Notes (Signed)
Subjective:    Patient ID: Meredith Rodriguez, female    DOB: 06-06-1925, 79 y.o.   MRN: [id number]  HPI  Pt presents to the clinic today with c/o elevated BP. This was noticed today when she was at her oncologist office. Her BP was 205/87. She does have a history of HTN. She is on Norvasc and Toprol. She denies lightheadedness, dizziness, chest pain, chest tightness or shortness of breath.  Additionally, she c/o burning with urination. This started 1 day ago. She has not taken anything OTC for this. She denies fever chills or body aches. She has had UTI's in the past but is unsure if this feels the same.  Review of Systems      Past Medical History  Diagnosis Date  . Hypertension   . Diabetes mellitus   . Hyperkalemia   . IBS (irritable bowel syndrome)   . Hypercholesterolemia   . Cervical radiculopathy   . Anemia   . Cancer     leukemia    Current Outpatient Prescriptions  Medication Sig Dispense Refill  . amLODipine (NORVASC) 5 MG tablet TAKE 1 TABLET BY MOUTH DAILY.  90 tablet  3  . aspirin 81 MG tablet Take 81 mg by mouth daily.      . cholecalciferol (VITAMIN D) 1000 UNITS tablet Take 1,000 Units by mouth daily.      Marland Kitchen glucose blood (FREESTYLE LITE) test strip Test blood sugar QID or as directed by MD.  100 each  11  . insulin glargine (LANTUS SOLOSTAR) 100 UNIT/ML injection Inject 0.1 mLs (10 Units total) into the skin daily.  5 pen  1  . Insulin Pen Needle (B-D UF III MINI PEN NEEDLES) 31G X 5 MM MISC Use with Lantus pen daily as instructed  100 each  6  . Lancets (FREESTYLE) lancets 1 each by Other route 4 (four) times daily. Use as instructed  200 each  12  . lidocaine-prilocaine (EMLA) cream Apply topically as needed.  30 g  4  . LORazepam (ATIVAN) 0.5 MG tablet       . metoprolol (LOPRESSOR) 50 MG tablet Take 1 tablet (50 mg total) by mouth 2 (two) times daily.  180 tablet  3  . mometasone (ELOCON) 0.1 % lotion Apply topically daily.  60 mL  1  . Omega-3 Fatty Acids (FISH  OIL) 1000 MG CAPS Take 1,000 mg by mouth daily.       . pregabalin (LYRICA) 75 MG capsule Take 1 capsule (75 mg total) by mouth 2 (two) times daily.  60 capsule  0  . simvastatin (ZOCOR) 5 MG tablet Take 5 mg by mouth at bedtime.      . simvastatin (ZOCOR) 5 MG tablet TAKE 1 TABLET BY MOUTH AT BEDTIME  90 tablet  3  . [DISCONTINUED] glimepiride (AMARYL) 2 MG tablet Take 1 tablet (2 mg total) by mouth every morning.  30 tablet  11   Current Facility-Administered Medications  Medication Dose Route Frequency Provider Last Rate Last Dose  . TDaP (BOOSTRIX) injection 0.5 mL  0.5 mL Intramuscular Once [name], MD        Allergies  Allergen Reactions  . Amoxicillin (Amoxicillin)     Upset stomach  . Cephalexin Nausea Only  . Metformin And Related     Gi problems  . Phenergan (Promethazine Hcl) Other (See Comments)    Unknown   . Procardia (Nifedipine)     dizziness    Family History  Problem Relation  Age of Onset  . Stroke Mother   . Hypertension Father   . Cancer Sister   . Cancer Sister   . Diabetes Sister   . Cancer Sister     colon cancer  . Cancer Daughter     breast cancer    History   Social History  . Marital Status: Married    Spouse Name: N/A    Number of Children: N/A  . Years of Education: N/A   Occupational History  . Not on file.   Social History Main Topics  . Smoking status: Never Smoker   . Smokeless tobacco: Not on file  . Alcohol Use: No  . Drug Use: No  . Sexually Active: No   Other Topics Concern  . Not on file   Social History Narrative  . No narrative on file     Constitutional: Pt reports fatigue. Denies fever, malaise, headache or abrupt weight changes. Marland Kitchen Respiratory: Denies difficulty breathing, shortness of breath, cough or sputum production.   Cardiovascular: Denies chest pain, chest tightness, palpitations or swelling in the hands or feet.  GU: Pt reports burning with urination. Denies urgency, frequency, pain with  urination,blood in urine, odor or discharge. Neurological: Denies dizziness, difficulty with memory, difficulty with speech or problems with balance and coordination.   No other specific complaints in a complete review of systems (except as listed in HPI above).  Objective:   Physical Exam   BP 182/84  Pulse 74  Temp(Src) 98.1 F (36.7 C) (Oral)  Wt 104 lb (47.174 kg)  BMI 17.31 kg/m2  SpO2 97% Wt Readings from Last 3 Encounters:  11/24/12 104 lb (47.174 kg)  11/23/12 106 lb 4.8 oz (48.217 kg)  10/29/12 107 lb (48.535 kg)    General: Appears her stated age, well developed, well nourished in NAD. Cardiovascular: Normal rate and rhythm. S1,S2 noted.  No murmur, rubs or gallops noted. No JVD or BLE edema. No carotid bruits noted. Pulmonary/Chest: Normal effort and positive vesicular breath sounds. No respiratory distress. No wheezes, rales or ronchi noted.  Abdomen: Soft and nontender. Normal bowel sounds, no bruits noted. No distention or masses noted. Liver, spleen and kidneys non palpable. No CVA tenderness. Neurological: Alert and oriented. Cranial nerves II-XII intact. Coordination normal. +DTRs bilaterally.  BMET    Component Value Date/Time   NA 140 11/23/2012 0845   NA 138 05/25/2012 0550   K 5.0 11/23/2012 0845   K 3.9 05/25/2012 0550   CL 99 06/01/2012 1028   CL 100 05/25/2012 0550   CO2 30* 11/23/2012 0845   CO2 33* 05/25/2012 0550   GLUCOSE 210* 11/23/2012 0845   GLUCOSE 230* 06/01/2012 1028   GLUCOSE 206* 05/25/2012 0550   BUN 16.2 11/23/2012 0845   BUN 16 05/25/2012 0550   CREATININE 0.7 11/23/2012 0845   CREATININE 0.54 05/25/2012 0550   CREATININE 0.69 07/18/2010 1202   CALCIUM 9.4 11/23/2012 0845   CALCIUM 9.0 05/25/2012 0550   GFRNONAA 84* 05/25/2012 0550   GFRAA >90 05/25/2012 0550    Lipid Panel     Component Value Date/Time   CHOL 122 10/29/2012 0905   TRIG 149.0 10/29/2012 0905   HDL 40.70 10/29/2012 0905   CHOLHDL 3 10/29/2012 0905   VLDL 29.8 10/29/2012 0905    LDLCALC 52 10/29/2012 0905    CBC    Component Value Date/Time   WBC 4.6 11/23/2012 0844   WBC 3.7* 05/26/2012 0635   RBC 3.73 11/23/2012 0844   RBC 3.25*  05/26/2012 0635   HGB 10.9* 11/23/2012 0844   HGB 9.8* 05/26/2012 0635   HCT 33.6* 11/23/2012 0844   HCT 29.6* 05/26/2012 0635   PLT 186 11/23/2012 0844   PLT 75* 05/26/2012 0635   MCV 90.1 11/23/2012 0844   MCV 91.1 05/26/2012 0635   MCH 29.2 11/23/2012 0844   MCH 30.2 05/26/2012 0635   MCHC 32.4 11/23/2012 0844   MCHC 33.1 05/26/2012 0635   RDW 14.5 11/23/2012 0844   RDW 14.4 05/26/2012 0635   LYMPHSABS 0.8* 11/23/2012 0844   LYMPHSABS 1.2 05/26/2012 0635   MONOABS 0.0* 11/23/2012 0844   MONOABS 0.0* 05/26/2012 0635   EOSABS 0.1 11/23/2012 0844   EOSABS 0.0 05/26/2012 0635   BASOSABS 0.0 11/23/2012 0844   BASOSABS 0.0 05/26/2012 0635    Hgb A1C Lab Results  Component Value Date   HGBA1C 7.9* 10/29/2012        Assessment & Plan:   Burning with urination, new onset:  Will check U/A to r/o infection

## 2012-11-26 ENCOUNTER — Encounter (HOSPITAL_COMMUNITY): Payer: Self-pay | Admitting: Emergency Medicine

## 2012-11-26 ENCOUNTER — Emergency Department (HOSPITAL_COMMUNITY): Payer: Medicare Other

## 2012-11-26 ENCOUNTER — Emergency Department (HOSPITAL_COMMUNITY)
Admission: EM | Admit: 2012-11-26 | Discharge: 2012-11-26 | Disposition: A | Discharge status: Home / Self Care | Payer: Medicare Other | Attending: Emergency Medicine | Admitting: Emergency Medicine

## 2012-11-26 ENCOUNTER — Other Ambulatory Visit: Payer: Self-pay

## 2012-11-26 DIAGNOSIS — Z8739 Personal history of other diseases of the musculoskeletal system and connective tissue: Secondary | ICD-10-CM | POA: Insufficient documentation

## 2012-11-26 DIAGNOSIS — I10 Essential (primary) hypertension: Secondary | ICD-10-CM

## 2012-11-26 DIAGNOSIS — R300 Dysuria: Secondary | ICD-10-CM | POA: Insufficient documentation

## 2012-11-26 DIAGNOSIS — R0602 Shortness of breath: Secondary | ICD-10-CM | POA: Insufficient documentation

## 2012-11-26 DIAGNOSIS — E119 Type 2 diabetes mellitus without complications: Secondary | ICD-10-CM | POA: Insufficient documentation

## 2012-11-26 DIAGNOSIS — Z8639 Personal history of other endocrine, nutritional and metabolic disease: Secondary | ICD-10-CM | POA: Insufficient documentation

## 2012-11-26 DIAGNOSIS — Z79899 Other long term (current) drug therapy: Secondary | ICD-10-CM | POA: Insufficient documentation

## 2012-11-26 DIAGNOSIS — R5383 Other fatigue: Secondary | ICD-10-CM | POA: Insufficient documentation

## 2012-11-26 DIAGNOSIS — Z794 Long term (current) use of insulin: Secondary | ICD-10-CM | POA: Insufficient documentation

## 2012-11-26 DIAGNOSIS — Z7982 Long term (current) use of aspirin: Secondary | ICD-10-CM | POA: Insufficient documentation

## 2012-11-26 DIAGNOSIS — R42 Dizziness and giddiness: Secondary | ICD-10-CM | POA: Insufficient documentation

## 2012-11-26 DIAGNOSIS — Z862 Personal history of diseases of the blood and blood-forming organs and certain disorders involving the immune mechanism: Secondary | ICD-10-CM | POA: Insufficient documentation

## 2012-11-26 DIAGNOSIS — E7800 Pure hypercholesterolemia, unspecified: Secondary | ICD-10-CM | POA: Insufficient documentation

## 2012-11-26 DIAGNOSIS — Z8719 Personal history of other diseases of the digestive system: Secondary | ICD-10-CM | POA: Insufficient documentation

## 2012-11-26 DIAGNOSIS — R5381 Other malaise: Secondary | ICD-10-CM | POA: Insufficient documentation

## 2012-11-26 LAB — URINALYSIS, ROUTINE W REFLEX MICROSCOPIC
Bilirubin Urine: NEGATIVE
Hgb urine dipstick: NEGATIVE
Ketones, ur: NEGATIVE mg/dL
Protein, ur: NEGATIVE mg/dL
Urobilinogen, UA: 1 mg/dL (ref 0.0–1.0)

## 2012-11-26 MED ORDER — SODIUM CHLORIDE 0.9 % IJ SOLN: 10.0000 mL | INTRAMUSCULAR | Status: DC | PRN

## 2012-11-26 MED ORDER — HEPARIN SOD (PORK) LOCK FLUSH 100 UNIT/ML IV SOLN: 500.0000 [IU] | INTRAVENOUS | Status: DC | PRN

## 2012-11-26 NOTE — ED Notes (Signed)
MD aware of pt's vital at discharge

## 2012-11-26 NOTE — ED Provider Notes (Signed)
CSN: [id number]     Arrival date & time 11/26/12  1740 History     First MD Initiated Contact with Patient 11/26/12 1749     Chief Complaint  Patient presents with  . Hypertension   (Consider location/radiation/quality/duration/timing/severity/associated sxs/prior Treatment) HPI Pt seen by PMD 2 days ago for persistent HTN and increased norvasc from 5 to 10 mg. Pt has had SBP from 160's-190's since. She describes mild dizziness and SOB with exertion earlier today but now is asymptomatic other than decreased energy.  Pt had labs and urine performed in office that were normal other than increased glucose. No focal weakness or vision changes. No Cp, cough fever, chills. Pt reports ongoing dysuria. No flank pain.  Past Medical History  Diagnosis Date  . Hypertension   . Diabetes mellitus   . Hyperkalemia   . IBS (irritable bowel syndrome)   . Hypercholesterolemia   . Cervical radiculopathy   . Anemia   . Cancer     leukemia   Past Surgical History  Procedure Laterality Date  . Varicose veins      surgey 1959  . Tonsilectomy, adenoidectomy, bilateral myringotomy and tubes    . Esophagogastroduodenoscopy  04/01/2011    Procedure: ESOPHAGOGASTRODUODENOSCOPY (EGD);  Surgeon: Barrie Folk, MD;  Location: [facility] ENDOSCOPY;  Service: Endoscopy;  Laterality: N/A;   Family History  Problem Relation Age of Onset  . Stroke Mother   . Hypertension Father   . Cancer Sister   . Cancer Sister   . Diabetes Sister   . Cancer Sister     colon cancer  . Cancer Daughter     breast cancer   History  Substance Use Topics  . Smoking status: Never Smoker   . Smokeless tobacco: Not on file  . Alcohol Use: No   OB History   Grav Para Term Preterm Abortions TAB SAB Ect Mult Living                 Review of Systems  Constitutional: Positive for fatigue. Negative for fever and chills.  HENT: Negative for neck pain and neck stiffness.   Eyes: Negative for visual disturbance.  Respiratory:  Positive for shortness of breath. Negative for chest tightness and wheezing.   Cardiovascular: Negative for chest pain, palpitations and leg swelling.  Gastrointestinal: Negative for nausea, vomiting, abdominal pain and diarrhea.  Genitourinary: Positive for dysuria. Negative for frequency and hematuria.  Musculoskeletal: Negative for myalgias, back pain and arthralgias.  Skin: Negative for rash.  Neurological: Positive for dizziness and light-headedness. Negative for syncope, weakness, numbness and headaches.  All other systems reviewed and are negative.    Allergies  Amoxicillin; Cephalexin; Metformin and related; Phenergan; and Procardia  Home Medications   Current Outpatient Rx  Name  Route  Sig  Dispense  Refill  . amLODipine (NORVASC) 10 MG tablet   Oral   Take 1 tablet (10 mg total) by mouth daily.   90 tablet   3   . aspirin 81 MG tablet   Oral   Take 81 mg by mouth daily.         . cholecalciferol (VITAMIN D) 1000 UNITS tablet   Oral   Take 1,000 Units by mouth daily.         . insulin glargine (LANTUS SOLOSTAR) 100 UNIT/ML injection   Subcutaneous   Inject 0.1 mLs (10 Units total) into the skin daily.   5 pen   1   . lidocaine-prilocaine (EMLA) cream  Topical   Apply topically as needed.   30 g   4   . metoprolol (LOPRESSOR) 50 MG tablet   Oral   Take 1 tablet (50 mg total) by mouth 2 (two) times daily.   180 tablet   3   . Omega-3 Fatty Acids (FISH OIL) 1000 MG CAPS   Oral   Take 1,000 mg by mouth daily.          . simvastatin (ZOCOR) 5 MG tablet   Oral   Take 5 mg by mouth at bedtime.          BP 176/59  Pulse 89  Temp(Src) 98.6 F (37 C) (Oral)  Resp 16  SpO2 99% Physical Exam  Nursing note and vitals reviewed. Constitutional: She is oriented to person, place, and time. She appears well-developed and well-nourished. No distress.  HENT:  Head: Normocephalic and atraumatic.  Mouth/Throat: Oropharynx is clear and moist. No  oropharyngeal exudate.  Eyes: EOM are normal. Pupils are equal, round, and reactive to light.  Neck: Normal range of motion. Neck supple.  Cardiovascular: Normal rate and regular rhythm.   Pulmonary/Chest: Effort normal and breath sounds normal. No respiratory distress. She has no wheezes. She has no rales. She exhibits no tenderness.  Abdominal: Soft. Bowel sounds are normal. She exhibits no distension and no mass. There is no tenderness. There is no rebound and no guarding.  Musculoskeletal: Normal range of motion. She exhibits no edema and no tenderness.  Neurological: She is alert and oriented to person, place, and time.  5/5 motor in all ext, sensation intact  Skin: Skin is warm and dry. No rash noted. No erythema.  Psychiatric: She has a normal mood and affect. Her behavior is normal.    ED Course   Procedures (including critical care time)  Labs Reviewed  GLUCOSE, CAPILLARY - Abnormal; Notable for the following:    Glucose-Capillary 204 (*)    All other components within normal limits  URINALYSIS, ROUTINE W REFLEX MICROSCOPIC   Dg Chest 2 View  11/26/2012   *RADIOLOGY REPORT*  Clinical Data: Hypertension  CHEST - 2 VIEW  Comparison: May 17, 2012  Findings: The previously noted right perihilar opacity is not present today.  There is no focal infiltrate, pulmonary edema, or pleural effusion.  The mediastinal contour and cardiac silhouette are stable.  Right central venous line is stable.  The soft tissues and osseous structures are stable.  IMPRESSION: No acute cardiopulmonary disease identified.  Previously noted right perihilar opacity is not present today.   Original Report Authenticated By: Sherian Rein, M.D.   1. Hypertension     MDM  Well appearing. Do not feel it necessary to repeat blood work at this time. Will check glu, repeat UA due to persistent symptoms and get CXR. BP is mildly elevated in ED at this time. Have referred pt back tp PMD for ongoing management.    Pt remains stable in ED. Encouraged to f/u with PMD for BP med adjustment as needed. Return instructions given.   Loren Racer, MD 11/26/12 2018

## 2012-11-26 NOTE — ED Notes (Signed)
Pt c/o hypertension since Monday.  Reports being seen at cancer center Monday for hx of leukemia.  Was instructed to come to ED if pressure remained above baseline.  Pt c/o headache yesterday. Denies pain at this time.

## 2012-11-27 ENCOUNTER — Ambulatory Visit (INDEPENDENT_AMBULATORY_CARE_PROVIDER_SITE_OTHER): Payer: Medicare Other | Admitting: Internal Medicine

## 2012-11-27 ENCOUNTER — Encounter: Payer: Self-pay | Admitting: Internal Medicine

## 2012-11-27 VITALS — BP 130/60 | HR 77 | Temp 97.7°F | Resp 20 | Wt 105.0 lb

## 2012-11-27 DIAGNOSIS — I10 Essential (primary) hypertension: Secondary | ICD-10-CM

## 2012-11-27 DIAGNOSIS — E1165 Type 2 diabetes mellitus with hyperglycemia: Secondary | ICD-10-CM

## 2012-11-27 DIAGNOSIS — E119 Type 2 diabetes mellitus without complications: Secondary | ICD-10-CM

## 2012-11-27 NOTE — Progress Notes (Signed)
Subjective:    Patient ID: Meredith Rodriguez, female    DOB: 1926-02-18, 79 y.o.   MRN: [id number]  HPI  79 year old patient who is seen today for followup. She has had some recent elevated blood pressure readings especially systolic hypertension she was seen in the ED recently with some shortness of breath that has resolved. Chest x-ray was normal. Today she feels quite well She has a history of insulin requiring diabetes last hemoglobin A1c 7.9. She is on low dose basal insulin every morning 10 units of Lantus but is only on rare of mealtime insulin. She states that she uses mealtime insulin only 3 or 4 times per month  BP Readings from Last 3 Encounters:  11/27/12 130/60  11/26/12 176/59  11/24/12 182/84   Past Medical History  Diagnosis Date  . Hypertension   . Diabetes mellitus   . Hyperkalemia   . IBS (irritable bowel syndrome)   . Hypercholesterolemia   . Cervical radiculopathy   . Anemia   . Cancer     leukemia    History   Social History  . Marital Status: Married    Spouse Name: N/A    Number of Children: N/A  . Years of Education: N/A   Occupational History  . Not on file.   Social History Main Topics  . Smoking status: Never Smoker   . Smokeless tobacco: Not on file  . Alcohol Use: No  . Drug Use: No  . Sexually Active: No   Other Topics Concern  . Not on file   Social History Narrative  . No narrative on file    Past Surgical History  Procedure Laterality Date  . Varicose veins      surgey 1959  . Tonsilectomy, adenoidectomy, bilateral myringotomy and tubes    . Esophagogastroduodenoscopy  04/01/2011    Procedure: ESOPHAGOGASTRODUODENOSCOPY (EGD);  Surgeon: Barrie Folk, MD;  Location: [facility] ENDOSCOPY;  Service: Endoscopy;  Laterality: N/A;    Family History  Problem Relation Age of Onset  . Stroke Mother   . Hypertension Father   . Cancer Sister   . Cancer Sister   . Diabetes Sister   . Cancer Sister     colon cancer  . Cancer Daughter     breast  cancer    Allergies  Allergen Reactions  . Amoxicillin (Amoxicillin)     Upset stomach  . Cephalexin Nausea Only  . Metformin And Related     Gi problems  . Phenergan (Promethazine Hcl) Other (See Comments)    Unknown   . Procardia (Nifedipine)     dizziness    Current Outpatient Prescriptions on File Prior to Visit  Medication Sig Dispense Refill  . amLODipine (NORVASC) 10 MG tablet Take 1 tablet (10 mg total) by mouth daily.  90 tablet  3  . aspirin 81 MG tablet Take 81 mg by mouth daily.      . cholecalciferol (VITAMIN D) 1000 UNITS tablet Take 1,000 Units by mouth daily.      . insulin glargine (LANTUS SOLOSTAR) 100 UNIT/ML injection Inject 0.1 mLs (10 Units total) into the skin daily.  5 pen  1  . lidocaine-prilocaine (EMLA) cream Apply topically as needed.  30 g  4  . metoprolol (LOPRESSOR) 50 MG tablet Take 1 tablet (50 mg total) by mouth 2 (two) times daily.  180 tablet  3  . Omega-3 Fatty Acids (FISH OIL) 1000 MG CAPS Take 1,000 mg by mouth daily.       Marland Kitchen  simvastatin (ZOCOR) 5 MG tablet Take 5 mg by mouth at bedtime.      . [DISCONTINUED] glimepiride (AMARYL) 2 MG tablet Take 1 tablet (2 mg total) by mouth every morning.  30 tablet  11   Current Facility-Administered Medications on File Prior to Visit  Medication Dose Route Frequency Provider Last Rate Last Dose  . TDaP (BOOSTRIX) injection 0.5 mL  0.5 mL Intramuscular Once [name], MD        BP 130/60  Pulse 77  Temp(Src) 97.7 F (36.5 C) (Oral)  Resp 20  Wt 105 lb (47.628 kg)  BMI 17.47 kg/m2  SpO2 98%     Review of Systems  Constitutional: Negative.   HENT: Negative for hearing loss, congestion, sore throat, rhinorrhea, dental problem, sinus pressure and tinnitus.   Eyes: Negative for pain, discharge and visual disturbance.  Respiratory: Negative for cough and shortness of breath.   Cardiovascular: Negative for chest pain, palpitations and leg swelling.  Gastrointestinal: Negative for nausea,  vomiting, abdominal pain, diarrhea, constipation, blood in stool and abdominal distention.  Genitourinary: Negative for dysuria, urgency, frequency, hematuria, flank pain, vaginal bleeding, vaginal discharge, difficulty urinating, vaginal pain and pelvic pain.  Musculoskeletal: Negative for joint swelling, arthralgias and gait problem.  Skin: Negative for rash.  Neurological: Negative for dizziness, syncope, speech difficulty, weakness, numbness and headaches.  Hematological: Negative for adenopathy.  Psychiatric/Behavioral: Negative for behavioral problems, dysphoric mood and agitation. The patient is not nervous/anxious.        Objective:   Physical Exam  Constitutional: She is oriented to person, place, and time. She appears well-developed and well-nourished.  Repeat blood pressure 130/60 in both arms  HENT:  Head: Normocephalic.  Right Ear: External ear normal.  Left Ear: External ear normal.  Mouth/Throat: Oropharynx is clear and moist.  Eyes: Conjunctivae and EOM are normal. Pupils are equal, round, and reactive to light.  Neck: Normal range of motion. Neck supple. No thyromegaly present.  Cardiovascular: Normal rate, regular rhythm, normal heart sounds and intact distal pulses.   Pulmonary/Chest: Effort normal and breath sounds normal. No respiratory distress. She has no wheezes. She has no rales.  O2 saturation 98  Abdominal: Soft. Bowel sounds are normal. She exhibits no mass. There is no tenderness.  Musculoskeletal: Normal range of motion.  Lymphadenopathy:    She has no cervical adenopathy.  Neurological: She is alert and oriented to person, place, and time.  Skin: Skin is warm and dry. No rash noted.  Psychiatric: She has a normal mood and affect. Her behavior is normal.          Assessment & Plan:   Diabetes mellitus. It was suggested that the patient take mealtime insulin daily with her evening meal. Samples of insulin dispensed Hypertension well controlled at  present  Followup PCP in 2 months as planned

## 2012-11-27 NOTE — Patient Instructions (Signed)
Please check your hemoglobin A1c every 3 months  Limit your sodium (Salt) intake  4 units of Humalog prior to your evening meal

## 2012-11-30 ENCOUNTER — Other Ambulatory Visit (HOSPITAL_BASED_OUTPATIENT_CLINIC_OR_DEPARTMENT_OTHER): Payer: Medicare Other | Admitting: Lab

## 2012-11-30 DIAGNOSIS — C9200 Acute myeloblastic leukemia, not having achieved remission: Secondary | ICD-10-CM

## 2012-11-30 LAB — CBC WITH DIFFERENTIAL/PLATELET
BASO%: 0.2 % (ref 0.0–2.0)
Basophils Absolute: 0 10*3/uL (ref 0.0–0.1)
EOS%: 1.7 % (ref 0.0–7.0)
HCT: 30 % — ABNORMAL LOW (ref 34.8–46.6)
LYMPH%: 18.6 % (ref 14.0–49.7)
MCH: 28.7 pg (ref 25.1–34.0)
MCHC: 32 g/dL (ref 31.5–36.0)
MCV: 89.6 fL (ref 79.5–101.0)
NEUT%: 78.5 % — ABNORMAL HIGH (ref 38.4–76.8)
Platelets: 320 10*3/uL (ref 145–400)

## 2012-11-30 LAB — IRON AND TIBC CHCC
%SAT: 114 % — ABNORMAL HIGH (ref 21–57)
Iron: 207 ug/dL — ABNORMAL HIGH (ref 41–142)
UIBC: UNDETERMINED ug/dL (ref 120–384)

## 2012-11-30 LAB — TECHNOLOGIST REVIEW

## 2012-12-07 ENCOUNTER — Other Ambulatory Visit (HOSPITAL_BASED_OUTPATIENT_CLINIC_OR_DEPARTMENT_OTHER): Payer: Medicare Other | Admitting: Lab

## 2012-12-07 ENCOUNTER — Telehealth: Payer: Self-pay | Admitting: *Deleted

## 2012-12-07 DIAGNOSIS — C9200 Acute myeloblastic leukemia, not having achieved remission: Secondary | ICD-10-CM

## 2012-12-07 LAB — CBC WITH DIFFERENTIAL/PLATELET
Basophils Absolute: 0 10*3/uL (ref 0.0–0.1)
EOS%: 1.7 % (ref 0.0–7.0)
HGB: 8.6 g/dL — ABNORMAL LOW (ref 11.6–15.9)
MCH: 28.5 pg (ref 25.1–34.0)
MONO%: 0.5 % (ref 0.0–14.0)
NEUT#: 5.2 10*3/uL (ref 1.5–6.5)
RBC: 3.02 10*6/uL — ABNORMAL LOW (ref 3.70–5.45)
RDW: 14.5 % (ref 11.2–14.5)
lymph#: 1 10*3/uL (ref 0.9–3.3)

## 2012-12-07 LAB — HOLD TUBE, BLOOD BANK

## 2012-12-07 LAB — TECHNOLOGIST REVIEW

## 2012-12-07 NOTE — Telephone Encounter (Signed)
Per staff phone call and POF I have schedueld appts.  JMW  

## 2012-12-14 ENCOUNTER — Encounter (HOSPITAL_COMMUNITY)
Admission: RE | Admit: 2012-12-14 | Discharge: 2012-12-14 | Disposition: A | Discharge status: Home / Self Care | Payer: Medicare Other | Source: Ambulatory Visit | Attending: Oncology | Admitting: Oncology

## 2012-12-14 ENCOUNTER — Other Ambulatory Visit: Payer: Self-pay | Admitting: Medical Oncology

## 2012-12-14 ENCOUNTER — Other Ambulatory Visit (HOSPITAL_BASED_OUTPATIENT_CLINIC_OR_DEPARTMENT_OTHER): Payer: Medicare Other

## 2012-12-14 ENCOUNTER — Ambulatory Visit (HOSPITAL_BASED_OUTPATIENT_CLINIC_OR_DEPARTMENT_OTHER): Payer: Medicare Other

## 2012-12-14 VITALS — BP 132/41 | HR 71 | Temp 97.6°F | Resp 18

## 2012-12-14 DIAGNOSIS — C9200 Acute myeloblastic leukemia, not having achieved remission: Secondary | ICD-10-CM

## 2012-12-14 LAB — CBC WITH DIFFERENTIAL/PLATELET
BASO%: 0 % (ref 0.0–2.0)
EOS%: 1.5 % (ref 0.0–7.0)
HCT: 22.2 % — ABNORMAL LOW (ref 34.8–46.6)
MCH: 28.4 pg (ref 25.1–34.0)
MCHC: 32 g/dL (ref 31.5–36.0)
MONO#: 0 10*3/uL — ABNORMAL LOW (ref 0.1–0.9)
NEUT%: 84.5 % — ABNORMAL HIGH (ref 38.4–76.8)
RBC: 2.5 10*6/uL — ABNORMAL LOW (ref 3.70–5.45)
RDW: 14.7 % — ABNORMAL HIGH (ref 11.2–14.5)
WBC: 6 10*3/uL (ref 3.9–10.3)
lymph#: 0.8 10*3/uL — ABNORMAL LOW (ref 0.9–3.3)
nRBC: 0 % (ref 0–0)

## 2012-12-14 LAB — PREPARE RBC (CROSSMATCH)

## 2012-12-14 LAB — HOLD TUBE, BLOOD BANK

## 2012-12-14 MED ORDER — DIPHENHYDRAMINE HCL 25 MG PO CAPS
25.0000 mg | ORAL_CAPSULE | Freq: Once | ORAL | Status: AC
  Administered 2012-12-14: 25 mg via ORAL

## 2012-12-14 MED ORDER — HEPARIN SOD (PORK) LOCK FLUSH 100 UNIT/ML IV SOLN
250.0000 [IU] | INTRAVENOUS | Status: DC | PRN
  Filled 2012-12-14: qty 5

## 2012-12-14 MED ORDER — ACETAMINOPHEN 325 MG PO TABS
650.0000 mg | ORAL_TABLET | Freq: Once | ORAL | Status: AC
  Administered 2012-12-14: 650 mg via ORAL

## 2012-12-14 MED ORDER — SODIUM CHLORIDE 0.9 % IJ SOLN
3.0000 mL | INTRAMUSCULAR | Status: AC | PRN
  Administered 2012-12-14: 3 mL
  Filled 2012-12-14: qty 10

## 2012-12-14 MED ORDER — HEPARIN SOD (PORK) LOCK FLUSH 100 UNIT/ML IV SOLN
500.0000 [IU] | Freq: Every day | INTRAVENOUS | Status: AC | PRN
  Administered 2012-12-14: 500 [IU]
  Filled 2012-12-14: qty 5

## 2012-12-14 MED ORDER — SODIUM CHLORIDE 0.9 % IV SOLN
250.0000 mL | Freq: Once | INTRAVENOUS | Status: AC
  Administered 2012-12-14: 250 mL via INTRAVENOUS

## 2012-12-14 NOTE — Patient Instructions (Addendum)
Blood Transfusion  A blood transfusion replaces your blood or some of its parts. Blood is replaced when you have lost blood because of surgery, an accident, or for severe blood conditions like anemia. You can donate blood to be used on yourself if you have a planned surgery. If you lose blood during that surgery, your own blood can be given back to you. Any blood given to you is checked to make sure it matches your blood type. Your temperature, blood pressure, and heart rate (vital signs) will be checked often.  GET HELP RIGHT AWAY IF:   You feel sick to your stomach (nauseous) or throw up (vomit).  You have watery poop (diarrhea).  You have shortness of breath or trouble breathing.  You have blood in your pee (urine) or have dark colored pee.  You have chest pain or tightness.  Your eyes or skin turn yellow (jaundice).  You have a temperature by mouth above 102 F (38.9 C), not controlled by medicine.  You start to shake and have chills.  You develop a a red rash (hives) or feel itchy.  You develop lightheadedness or feel confused.  You develop back, joint, or muscle pain.  You do not feel hungry (lost appetite).  You feel tired, restless, or nervous.  You develop belly (abdominal) cramps. Document Released: 07/12/2008 Document Revised: 07/08/2011 Document Reviewed: 07/12/2008 Exit[employer] Patient Information 2014 [employer].  

## 2012-12-15 ENCOUNTER — Ambulatory Visit (HOSPITAL_BASED_OUTPATIENT_CLINIC_OR_DEPARTMENT_OTHER): Payer: Medicare Other

## 2012-12-15 VITALS — BP 144/47 | HR 72 | Temp 98.1°F | Resp 18

## 2012-12-15 DIAGNOSIS — C9200 Acute myeloblastic leukemia, not having achieved remission: Secondary | ICD-10-CM

## 2012-12-15 DIAGNOSIS — D630 Anemia in neoplastic disease: Secondary | ICD-10-CM

## 2012-12-15 LAB — TYPE AND SCREEN
Donor AG Type: NEGATIVE
Donor AG Type: NEGATIVE

## 2012-12-15 MED ORDER — HEPARIN SOD (PORK) LOCK FLUSH 100 UNIT/ML IV SOLN
250.0000 [IU] | INTRAVENOUS | Status: DC | PRN
  Filled 2012-12-15: qty 5

## 2012-12-15 MED ORDER — ACETAMINOPHEN 325 MG PO TABS
650.0000 mg | ORAL_TABLET | Freq: Four times a day (QID) | ORAL | Status: DC | PRN
  Administered 2012-12-15: 650 mg via ORAL

## 2012-12-15 MED ORDER — HEPARIN SOD (PORK) LOCK FLUSH 100 UNIT/ML IV SOLN
500.0000 [IU] | Freq: Once | INTRAVENOUS | Status: AC
  Administered 2012-12-15: 500 [IU] via INTRAVENOUS
  Filled 2012-12-15: qty 5

## 2012-12-15 MED ORDER — SODIUM CHLORIDE 0.9 % IV SOLN
250.0000 mL | Freq: Once | INTRAVENOUS | Status: DC
Start: 2012-12-15 — End: 2012-12-15

## 2012-12-15 MED ORDER — DIPHENHYDRAMINE HCL 25 MG PO CAPS
25.0000 mg | ORAL_CAPSULE | Freq: Four times a day (QID) | ORAL | Status: DC | PRN
  Administered 2012-12-15: 25 mg via ORAL

## 2012-12-15 MED ORDER — SODIUM CHLORIDE 0.9 % IJ SOLN
10.0000 mL | INTRAMUSCULAR | Status: AC | PRN
  Administered 2012-12-15: 10 mL
  Filled 2012-12-15: qty 10

## 2012-12-15 MED ORDER — SODIUM CHLORIDE 0.9 % IV SOLN
250.0000 mL | Freq: Once | INTRAVENOUS | Status: AC
  Administered 2012-12-15: 250 mL via INTRAVENOUS

## 2012-12-15 NOTE — Patient Instructions (Addendum)
Blood Transfusion Information WHAT IS A BLOOD TRANSFUSION? A transfusion is the replacement of blood or some of its parts. Blood is made up of multiple cells which provide different functions.  Red blood cells carry oxygen and are used for blood loss replacement.  White blood cells fight against infection.  Platelets control bleeding.  Plasma helps clot blood.  Other blood products are available for specialized needs, such as hemophilia or other clotting disorders. BEFORE THE TRANSFUSION  Who gives blood for transfusions?   You may be able to donate blood to be used at a later date on yourself (autologous donation).  Relatives can be asked to donate blood. This is generally not any safer than if you have received blood from a stranger. The same precautions are taken to ensure safety when a relative's blood is donated.  Healthy volunteers who are fully evaluated to make sure their blood is safe. This is blood bank blood. Transfusion therapy is the safest it has ever been in the practice of medicine. Before blood is taken from a donor, a complete history is taken to make sure that person has no history of diseases nor engages in risky social behavior (examples are intravenous drug use or sexual activity with multiple partners). The donor's travel history is screened to minimize risk of transmitting infections, such as malaria. The donated blood is tested for signs of infectious diseases, such as HIV and hepatitis. The blood is then tested to be sure it is compatible with you in order to minimize the chance of a transfusion reaction. If you or a relative donates blood, this is often done in anticipation of surgery and is not appropriate for emergency situations. It takes many days to process the donated blood. RISKS AND COMPLICATIONS Although transfusion therapy is very safe and saves many lives, the main dangers of transfusion include:   Getting an infectious disease.  Developing a  transfusion reaction. This is an allergic reaction to something in the blood you were given. Every precaution is taken to prevent this. The decision to have a blood transfusion has been considered carefully by your caregiver before blood is given. Blood is not given unless the benefits outweigh the risks. AFTER THE TRANSFUSION  Right after receiving a blood transfusion, you will usually feel much better and more energetic. This is especially true if your red blood cells have gotten low (anemic). The transfusion raises the level of the red blood cells which carry oxygen, and this usually causes an energy increase.  The nurse administering the transfusion will monitor you carefully for complications. HOME CARE INSTRUCTIONS  No special instructions are needed after a transfusion. You may find your energy is better. Speak with your caregiver about any limitations on activity for underlying diseases you may have. SEEK MEDICAL CARE IF:   Your condition is not improving after your transfusion.  You develop redness or irritation at the intravenous (IV) site. SEEK IMMEDIATE MEDICAL CARE IF:  Any of the following symptoms occur over the next 12 hours:  Shaking chills.  You have a temperature by mouth above 102 F (38.9 C), not controlled by medicine.  Chest, back, or muscle pain.  People around you feel you are not acting correctly or are confused.  Shortness of breath or difficulty breathing.  Dizziness and fainting.  You get a rash or develop hives.  You have a decrease in urine output.  Your urine turns a dark color or changes to pink, red, or brown. Any of the following   symptoms occur over the next 10 days:  You have a temperature by mouth above 102 F (38.9 C), not controlled by medicine.  Shortness of breath.  Weakness after normal activity.  The white part of the eye turns yellow (jaundice).  You have a decrease in the amount of urine or are urinating less often.  Your  urine turns a dark color or changes to pink, red, or brown. Document Released: 04/12/2000 Document Revised: 07/08/2011 Document Reviewed: 11/30/2007 [employer] Patient Information 2014 [employer].  

## 2012-12-16 LAB — TYPE AND SCREEN
Antibody Screen: NEGATIVE
Unit division: 0

## 2012-12-21 ENCOUNTER — Ambulatory Visit (HOSPITAL_BASED_OUTPATIENT_CLINIC_OR_DEPARTMENT_OTHER): Payer: Medicare Other | Admitting: Adult Health

## 2012-12-21 ENCOUNTER — Telehealth: Payer: Self-pay | Admitting: Oncology

## 2012-12-21 ENCOUNTER — Encounter: Payer: Self-pay | Admitting: *Deleted

## 2012-12-21 ENCOUNTER — Other Ambulatory Visit (HOSPITAL_BASED_OUTPATIENT_CLINIC_OR_DEPARTMENT_OTHER): Payer: Medicare Other | Admitting: Lab

## 2012-12-21 ENCOUNTER — Encounter: Payer: Self-pay | Admitting: Oncology

## 2012-12-21 ENCOUNTER — Encounter: Payer: Self-pay | Admitting: Adult Health

## 2012-12-21 VITALS — BP 139/56 | HR 83 | Temp 98.5°F | Resp 20 | Ht 65.0 in | Wt 107.1 lb

## 2012-12-21 DIAGNOSIS — C9200 Acute myeloblastic leukemia, not having achieved remission: Secondary | ICD-10-CM

## 2012-12-21 DIAGNOSIS — D630 Anemia in neoplastic disease: Secondary | ICD-10-CM

## 2012-12-21 LAB — COMPREHENSIVE METABOLIC PANEL (CC13)
CO2: 28 mEq/L (ref 22–29)
Glucose: 178 mg/dl — ABNORMAL HIGH (ref 70–140)
Sodium: 140 mEq/L (ref 136–145)
Total Bilirubin: 0.68 mg/dL (ref 0.20–1.20)
Total Protein: 6.7 g/dL (ref 6.4–8.3)

## 2012-12-21 LAB — CBC WITH DIFFERENTIAL/PLATELET
Basophils Absolute: 0 10*3/uL (ref 0.0–0.1)
EOS%: 2.2 % (ref 0.0–7.0)
HCT: 34.6 % — ABNORMAL LOW (ref 34.8–46.6)
HGB: 11.3 g/dL — ABNORMAL LOW (ref 11.6–15.9)
LYMPH%: 14.4 % (ref 14.0–49.7)
MCH: 29.9 pg (ref 25.1–34.0)
MCV: 91.5 fL (ref 79.5–101.0)
MONO%: 0.4 % (ref 0.0–14.0)
NEUT%: 83 % — ABNORMAL HIGH (ref 38.4–76.8)
Platelets: 191 10*3/uL (ref 145–400)

## 2012-12-21 MED ORDER — DEFERASIROX 500 MG PO TBSO: 20.0000 mg/kg | ORAL_TABLET | Freq: Every day | ORAL | Status: DC

## 2012-12-21 NOTE — Patient Instructions (Signed)
Deferasirox tablets for oral suspension What is this medicine? DEFERASIROX (de FER a sir ox) binds to iron in the blood. It helps to prevent and treat too much iron in the blood caused by blood transfusions. This medicine may be used for other purposes; ask your health care provider or pharmacist if you have questions. What should I tell my health care provider before I take this medicine? They need to know if you have any of these conditions: -a blood disorder -cancer -hearing problems -kidney disease -liver disease -low blood counts, like low white cell, platelet, or red cell counts -vision problems -an unusual or allergic reaction to deferasirox, other medicines, foods, dyes, or preservatives -pregnant or trying to get pregnant -breast-feeding How should I use this medicine? Take this medicine by mouth. Do not chew or take tablets whole. Follow the directions on the prescription label. Take this medicine on an empty stomach, at least 30 minutes before food. Do not take with food. Before taking, mix the dose in water, orange juice, or apple juice as directed. If your dose is less than 1 gram, use 3.5 ounces or about 1/2 glass of liquid. If your dose is 1 gram or more, use 7 ounces or about 1 full glass of liquid. Stir the medicine into the liquid until the tablets dissolve. Drink your dose right away after mixing. If there is any medicine left in the glass after you drink the mixture, add a little bit more liquid, swirl it around, and drink. Take your doses at regular intervals. Do not take your medicine more often than directed. Talk to your pediatrician regarding the use of this medicine in children. While this medicine may be prescribed for children as young as 55 years of age for selected conditions, precautions do apply. Overdosage: If you think you have taken too much of this medicine contact a poison control center or emergency room at once. NOTE: This medicine is only for you. Do not  share this medicine with others. What if I miss a dose? If you miss a dose, take it as soon as you can. If it is almost time for your next dose, take only that dose. Do not take double or extra doses. What may interact with this medicine? -antacids that have aluminum -cholestyramine -iron supplements or vitamins that have iron -other iron binders like deferoxamine -phenobarbital -phenytoin -rifampin -ritonavir -vitamin C This list may not describe all possible interactions. Give your health care provider a list of all the medicines, herbs, non-prescription drugs, or dietary supplements you use. Also tell them if you smoke, drink alcohol, or use illegal drugs. Some items may interact with your medicine. What should I watch for while using this medicine? Visit your doctor for regular check ups. You will need important blood work, vision tests, and hearing tests done while you are taking this medicine. You may get drowsy or dizzy. Do not drive, use machinery, or do anything that needs mental alertness until you know how this medicine affects you. Do not stand or sit up quickly, especially if you are an older patient. This reduces the risk of dizzy or fainting spells. Avoid taking antacids containing aluminum at the same time as this medicine. What side effects may I notice from receiving this medicine? Side effects that you should report to your doctor or health care professional as soon as possible: -allergic reactions like skin rash, itching or hives, swelling of the face, lips, or tongue -black or bloody stools or blood in  vomit -changes in hearing -changes in vision -dark urine -fever or chills, sore throat -general ill feeling or flu-like symptoms -loss of appetite, nausea -right upper belly pain -trouble passing urine or change in the amount of urine -unusual bleeding or bruising -unusually weak or tired -yellowing of the eyes or skin Side effects that usually do not require  medical attention (report to your doctor or health care professional if they continue or are bothersome): -cough -diarrhea -headache -nausea, vomiting -stomach pain -trouble sleeping This list may not describe all possible side effects. Call your doctor for medical advice about side effects. You may report side effects to FDA at 1-800-FDA-1088. Where should I keep my medicine? Keep out of the reach of children. Store at room temperature between 15 and 30 degrees C (59 and 86 degrees F). Protect from moisture. Throw away any unused medicine after the expiration date. NOTE: This sheet is a summary. It may not cover all possible information. If you have questions about this medicine, talk to your doctor, pharmacist, or health care provider.  2013, [employer]. (06/10/2008 7:17:38 AM)

## 2012-12-21 NOTE — Progress Notes (Signed)
[employer] [facility], [id number], approved exjade 500mg  from 12/21/12-12/21/13 [id number].

## 2012-12-21 NOTE — Progress Notes (Signed)
OFFICE PROGRESS NOTE  CC  [name], MD [address] [place] [address]  DIAGNOSIS: 79 year old female with severe and profound anemia likely due to a primary bone marrow problem. 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  #2 patient has been on Procrit every 2 weeks unfortunately this has not effective and we will discontinue this.  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  #4 Patient received 3 cycles of Decitabine chemotherapy, her third cycle was complicated by febrile neutropenia and pneumonia causing hospitalization.  Since then we have not given any more chemotherapy, and have been treating her supportively ever since.   CURRENT THERAPY:Supportive care  INTERVAL HISTORY: Meredith Rodriguez 79 y.o. female returns for follow up today for her AML.  She is doing well and last had a blood transfusion last week.  Her hemoglobin rose from 7.1 to 11.3.  She is feeling well today and denies fatigue, shortness of breath, headache, chest pain, palpitations, night sweats, decreased appetite, or any other concerns.  A 10 point ROS is negative.    MEDICAL HISTORY: Past Medical History  Diagnosis Date  . Hypertension   . Diabetes mellitus   . Hyperkalemia   . IBS (irritable bowel syndrome)   . Hypercholesterolemia   . Cervical radiculopathy   . Anemia   . Cancer     leukemia    ALLERGIES:  is allergic to amoxicillin; cephalexin; metformin and related; phenergan; and procardia.  MEDICATIONS:  Current Outpatient Prescriptions  Medication Sig Dispense Refill  . amLODipine (NORVASC) 10 MG tablet Take 1 tablet (10 mg total) by mouth daily.  90 tablet  3  . aspirin 81 MG tablet Take 81 mg by mouth daily.      . cholecalciferol (VITAMIN D) 1000 UNITS tablet Take 1,000 Units by mouth daily.      . insulin  glargine (LANTUS SOLOSTAR) 100 UNIT/ML injection Inject 0.1 mLs (10 Units total) into the skin daily.  5 pen  1  . insulin lispro (HUMALOG KWIKPEN) 100 UNIT/ML SOPN Inject into the skin 3 (three) times daily with meals. As directed on sliding scale      . lidocaine-prilocaine (EMLA) cream Apply topically as needed.  30 g  4  . metoprolol (LOPRESSOR) 50 MG tablet Take 1 tablet (50 mg total) by mouth 2 (two) times daily.  180 tablet  3  . Omega-3 Fatty Acids (FISH OIL) 1000 MG CAPS Take 1,000 mg by mouth daily.       Marland Kitchen deferasirox (EXJADE) 500 MG disintegrating tablet Take 2 tablets (1,000 mg total) by mouth daily before breakfast.  60 tablet  3  . pregabalin (LYRICA) 75 MG capsule Take 75 mg by mouth as needed.      . [DISCONTINUED] glimepiride (AMARYL) 2 MG tablet Take 1 tablet (2 mg total) by mouth every morning.  30 tablet  11   Current Facility-Administered Medications  Medication Dose Route Frequency Provider Last Rate Last Dose  . TDaP (BOOSTRIX) injection 0.5 mL  0.5 mL Intramuscular Once [name], MD        SURGICAL HISTORY:  Past Surgical History  Procedure Laterality Date  . Varicose veins      surgey 1959  . Tonsilectomy, adenoidectomy, bilateral myringotomy and tubes    . Esophagogastroduodenoscopy  04/01/2011    Procedure: ESOPHAGOGASTRODUODENOSCOPY (EGD);  Surgeon: Jonny Ruiz  Morrie Sheldon, MD;  Location: [facility] ENDOSCOPY;  Service: Endoscopy;  Laterality: N/A;    REVIEW OF SYSTEMS:   General: fatigue (-), night sweats (+), fever (-), pain (-) Lymph: palpable nodes (-) HEENT: vision changes (-), mucositis (-), gum bleeding (-), epistaxis (-) Cardiovascular: chest pain (-), palpitations (-) Pulmonary: shortness of breath (-), dyspnea on exertion (-), cough (-), hemoptysis (-) GI:  Early satiety (-), melena (-), dysphagia (-), nausea/vomiting (-), diarrhea (-) GU: dysuria (-), hematuria (-), incontinence (-) Musculoskeletal: joint swelling (-), joint pain (-), back pain (-) Neuro:  weakness (-), numbness (-), headache (-), confusion (-) Skin: Rash (-), lesions (-), dryness (-) Psych: depression (-), suicidal/homicidal ideation (-), feeling of hopelessness (-)   PHYSICAL EXAMINATION: BP 139/56  Pulse 83  Temp(Src) 98.5 F (36.9 C) (Oral)  Resp 20  Ht 5\' 5"  (1.651 m)  Wt 107 lb 1.6 oz (48.58 kg)  BMI 17.82 kg/m2 General: Patient is a well appearing female in no acute distress HEENT: PERRLA, sclerae anicteric no conjunctival pallor, MMM no erythema or exudate, right nare with large blood clot Neck: supple, no lymphadenopathy Lungs: clear to auscultation bilaterally, no wheezes, rhonchi, or rales Cardiovascular: regular rate rhythm, S1, S2, no murmurs, rubs or gallops Abdomen: Soft, non-tender, non-distended, normoactive bowel sounds, no HSM Extremities: warm and well perfused, no clubbing, cyanosis, or edema Skin: No rashes.    Neuro: Non-focal ECOG PERFORMANCE STATUS: 1  LABORATORY DATA: Lab Results  Component Value Date   WBC 5.0 12/21/2012   HGB 11.3* 12/21/2012   HCT 34.6* 12/21/2012   MCV 91.5 12/21/2012   PLT 191 12/21/2012      Chemistry      Component Value Date/Time   NA 140 12/21/2012 1018   NA 138 05/25/2012 0550   K 4.4 12/21/2012 1018   K 3.9 05/25/2012 0550   CL 99 06/01/2012 1028   CL 100 05/25/2012 0550   CO2 28 12/21/2012 1018   CO2 33* 05/25/2012 0550   BUN 21.0 12/21/2012 1018   BUN 16 05/25/2012 0550   CREATININE 0.7 12/21/2012 1018   CREATININE 0.54 05/25/2012 0550   CREATININE 0.69 07/18/2010 1202   GLU 215* 02/26/2012 1255      Component Value Date/Time   CALCIUM 9.5 12/21/2012 1018   CALCIUM 9.0 05/25/2012 0550   ALKPHOS 55 12/21/2012 1018   ALKPHOS 42 05/18/2012 0955   AST 23 12/21/2012 1018   AST 16 05/18/2012 0955   ALT 29 12/21/2012 1018   ALT 19 05/18/2012 0955   BILITOT 0.68 12/21/2012 1018   BILITOT 1.1 05/18/2012 0955       RADIOGRAPHIC STUDIES:  No results found.  ASSESSMENT: 79 year old female with:  1.  severe anemia  due to primary refractory anemia Patient is receiving supportive therapy with blood transfusions.  2. 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 This is discussed with the daughter and the patient today recommendations were made as below   #3 patient and I and her daughters had an extensive discussion today about the likely transformation to acute leukemia. We discussed the process of chemotherapy, risks, benefits, and potential adverse effects of the drug.  Through much conversation between myself, the patient, and her children, the patient has decided to proceed with Decitabine chemotherapy.  Her chemotherapy has been discontinued due to her complication of febrile neutropenia and pneumonia.  Much more chemotherapy could take her life, therefore, we will continue with supportive care only.    #  5 Anemia  PLAN:  #1 Ms. Stacy is here for her monthly visit.  Her labs have again done well this month.  She has continues to need a blood transfusion every 3-4 weeks.   #2 Her iron studies were indicative of iron overload.  I have given the patient information about oral iron chelation therapy.  I sent a prescription into the [facility] long pharmacy for Exjade.  They are working on pre-authorization and financial assistance for the patient.    #3 Ms. Huntley will return for weekly lab visits, and we will see her back in 4 weeks.    All questions were answered. The patient knows to call the clinic with any problems, questions or concerns. We can certainly see the patient much sooner if necessary.  I spent 25 minutes counseling the patient face to face. The total time spent in the appointment was 30 minutes.  This case was reviewed with Dr. Welton Flakes.    Cherie Ouch Lyn Hollingshead, NP Medical Oncology [facility] Phone: [phone] 12/22/2012, 8:35 AM

## 2012-12-21 NOTE — Progress Notes (Signed)
RECEIVED A FAX FROM [facility] CONCERNING A PRIOR AUTHORIZATION FOR EXJADE. THIS REQUEST WAS PLACED IN THE MANAGED CARE BIN. 

## 2012-12-22 ENCOUNTER — Other Ambulatory Visit: Payer: Self-pay | Admitting: Emergency Medicine

## 2012-12-22 MED ORDER — DEFERASIROX 500 MG PO TBSO: 10.0000 mg/kg | ORAL_TABLET | Freq: Every day | ORAL | Status: DC

## 2012-12-23 ENCOUNTER — Telehealth: Payer: Self-pay | Admitting: Family Medicine

## 2012-12-23 NOTE — Telephone Encounter (Signed)
Patient Information:  Caller Name: Allura  Phone: [phone]  Patient: Meredith Rodriguez, Meredith Rodriguez  Gender: Female  DOB: 1926/01/08  Age: 79 Years  PCP: Evelena Peat (Family Practice)  Office Follow Up:  Does the office need to follow up with this patient?: Yes  Instructions For The Office: FYI she got a small scrape from a nail hanging on the wall and is in no distress and had a booster Tdap in 2013 but is on leukemia blood transfusions and Diabetic.  RN Note:  Onset 12/23/2012 scraped the left arm on a nail in the wall, between the elbow/hand the wound is the size of a penicl eraser,it bled alot (takes baby asa everyday). Bleeding has stopped and denies any distress. She is currently getting blood transfusions for Leukemia. 06/05/2011 had a Tdap immunization. All  homecare advice given and advised to call back with ANY changes based on diabetes and leukemia. She agreed.  Symptoms  Reason For Call & Symptoms: Onset 12/23/2012 after taking injection for diabetes, arm hit the nail on the wall and bled a lot. (taking baby asa everyday)  Reviewed Health History In EMR: Yes  Reviewed Medications In EMR: Yes  Reviewed Allergies In EMR: Yes  Reviewed Surgeries / Procedures: Yes  Date of Onset of Symptoms: 12/23/2012  Treatments Tried: washed out the wound and put alchol on it.  Treatments Tried Worked: No  Guideline(s) Used:  Skin Injury   Advice Given:  Pain Medicines:  For pain relief, you can take either acetaminophen, ibuprofen, or naproxen.  They are over-the-counter (OTC) pain drugs. You can buy them at the drugstore.  Apply a Cold Pack:  Apply a cold pack or an ice bag (wrapped in a moist towel) to the area for 20 minutes. Repeat in 1 hour, then every 4 hours while awake.  Continue this for the first 48 hours after an injury (Reason: to reduce the swelling and pain).  Reassurance:  This sounds like a minor contusion of that area. Contusion is the medical term for bruise. Any type of direct blow  to your skin can cause a bruise.  Symptoms are mild pain, swelling, and bruising.  Here is some care advice that should help.  Expected Course:  Pain and swelling usually begin to get better 2 to 3 days after an injury.  Swelling is usually gone in 1 week  Pain may take 2 weeks to go away.  Call Back If:  Swelling or bruise becomes over 2 inches (5 cm).  Pain not improved after 72 hours  Pain or swelling lasts over 7 days  You become worse.  Reassurance:  It sounds like a small cut or scrape that we can treat at home.  Here is some care advice that should help.  Bleeding  : Apply direct pressure for 10 minutes with a sterile gauze to stop any bleeding.  Cleaning the Wound:  Wash the wound with soap and water for 5 minutes.  For any dirt, scrub gently with a wash cloth.  For any bleeding, apply direct pressure with a sterile gauze or clean cloth for 10 minutes.  Antibiotic Ointment  Apply an Antibiotic Ointment (e.g., OTC Bacitracin), covered by a Band-Aid or dressing. Change daily or if it becomes wet.  Pain Medicines:  For pain relief, you can take either acetaminophen, ibuprofen, or naproxen.  Call Back If:  Looks infected (pus, redness, increasing tenderness)  Doesn't heal within 10 days  You become worse.  RN Overrode Recommendation:  Follow  Up With Office Later  She is stable, no issues at this time and wanted to just check if she had a Tetanus booster, which she did. RN/CAN alerted her to call office with ANY wound changes.

## 2012-12-25 ENCOUNTER — Encounter: Payer: Self-pay | Admitting: Oncology

## 2012-12-25 NOTE — Progress Notes (Signed)
Per PAN patient was approved for Exjade  12/22/12-12/21/13  5000.00 will forward to medical records and billing.

## 2012-12-29 ENCOUNTER — Other Ambulatory Visit (HOSPITAL_BASED_OUTPATIENT_CLINIC_OR_DEPARTMENT_OTHER): Payer: Medicare Other | Admitting: Lab

## 2012-12-29 DIAGNOSIS — C9200 Acute myeloblastic leukemia, not having achieved remission: Secondary | ICD-10-CM

## 2012-12-29 LAB — CBC WITH DIFFERENTIAL/PLATELET
Eosinophils Absolute: 0.1 10*3/uL (ref 0.0–0.5)
LYMPH%: 17.6 % (ref 14.0–49.7)
MONO#: 0 10*3/uL — ABNORMAL LOW (ref 0.1–0.9)
NEUT#: 3.5 10*3/uL (ref 1.5–6.5)
Platelets: 314 10*3/uL (ref 145–400)
RBC: 3.17 10*6/uL — ABNORMAL LOW (ref 3.70–5.45)
RDW: 15.4 % — ABNORMAL HIGH (ref 11.2–14.5)
WBC: 4.4 10*3/uL (ref 3.9–10.3)
nRBC: 0 % (ref 0–0)

## 2012-12-29 LAB — TECHNOLOGIST REVIEW

## 2012-12-29 LAB — HOLD TUBE, BLOOD BANK

## 2013-01-04 ENCOUNTER — Other Ambulatory Visit (HOSPITAL_BASED_OUTPATIENT_CLINIC_OR_DEPARTMENT_OTHER): Payer: Medicare Other | Admitting: Lab

## 2013-01-04 ENCOUNTER — Ambulatory Visit (HOSPITAL_COMMUNITY)
Admission: RE | Admit: 2013-01-04 | Discharge: 2013-01-04 | Disposition: A | Discharge status: Home / Self Care | Payer: Medicare Other | Source: Ambulatory Visit | Attending: Oncology | Admitting: Oncology

## 2013-01-04 ENCOUNTER — Telehealth: Payer: Self-pay | Admitting: *Deleted

## 2013-01-04 ENCOUNTER — Other Ambulatory Visit: Payer: Self-pay | Admitting: Emergency Medicine

## 2013-01-04 ENCOUNTER — Telehealth: Payer: Self-pay | Admitting: Oncology

## 2013-01-04 DIAGNOSIS — D649 Anemia, unspecified: Secondary | ICD-10-CM | POA: Insufficient documentation

## 2013-01-04 DIAGNOSIS — C9200 Acute myeloblastic leukemia, not having achieved remission: Secondary | ICD-10-CM

## 2013-01-04 LAB — CBC WITH DIFFERENTIAL/PLATELET
Eosinophils Absolute: 0.2 10*3/uL (ref 0.0–0.5)
MONO#: 0.1 10*3/uL (ref 0.1–0.9)
NEUT#: 4.6 10*3/uL (ref 1.5–6.5)
RBC: 2.82 10*6/uL — ABNORMAL LOW (ref 3.70–5.45)
RDW: 15.5 % — ABNORMAL HIGH (ref 11.2–14.5)
WBC: 5.5 10*3/uL (ref 3.9–10.3)
lymph#: 0.7 10*3/uL — ABNORMAL LOW (ref 0.9–3.3)
nRBC: 0 % (ref 0–0)

## 2013-01-04 LAB — HOLD TUBE, BLOOD BANK

## 2013-01-04 NOTE — Telephone Encounter (Signed)
Per staff message and POF I have scheduled appts.  JMW  

## 2013-01-11 ENCOUNTER — Other Ambulatory Visit: Payer: Self-pay | Admitting: Emergency Medicine

## 2013-01-11 ENCOUNTER — Other Ambulatory Visit (HOSPITAL_BASED_OUTPATIENT_CLINIC_OR_DEPARTMENT_OTHER): Payer: Medicare Other

## 2013-01-11 ENCOUNTER — Ambulatory Visit (HOSPITAL_BASED_OUTPATIENT_CLINIC_OR_DEPARTMENT_OTHER): Payer: Medicare Other

## 2013-01-11 VITALS — BP 158/62 | HR 72 | Temp 98.0°F | Resp 18

## 2013-01-11 DIAGNOSIS — C9200 Acute myeloblastic leukemia, not having achieved remission: Secondary | ICD-10-CM

## 2013-01-11 LAB — CBC WITH DIFFERENTIAL/PLATELET
BASO%: 0 % (ref 0.0–2.0)
Eosinophils Absolute: 0.1 10*3/uL (ref 0.0–0.5)
MCHC: 32 g/dL (ref 31.5–36.0)
MONO#: 0 10*3/uL — ABNORMAL LOW (ref 0.1–0.9)
MONO%: 0.4 % (ref 0.0–14.0)
NEUT#: 4.4 10*3/uL (ref 1.5–6.5)
RBC: 2.45 10*6/uL — ABNORMAL LOW (ref 3.70–5.45)
RDW: 15.7 % — ABNORMAL HIGH (ref 11.2–14.5)
WBC: 5.4 10*3/uL (ref 3.9–10.3)
nRBC: 0 % (ref 0–0)

## 2013-01-11 LAB — PREPARE RBC (CROSSMATCH)

## 2013-01-11 MED ORDER — DIPHENHYDRAMINE HCL 25 MG PO CAPS
25.0000 mg | ORAL_CAPSULE | Freq: Once | ORAL | Status: AC
  Administered 2013-01-11: 25 mg via ORAL

## 2013-01-11 MED ORDER — ACETAMINOPHEN 325 MG PO TABS
ORAL_TABLET | ORAL | Status: AC
  Filled 2013-01-11: qty 2

## 2013-01-11 MED ORDER — ACETAMINOPHEN 325 MG PO TABS
650.0000 mg | ORAL_TABLET | Freq: Once | ORAL | Status: AC
  Administered 2013-01-11: 650 mg via ORAL

## 2013-01-11 MED ORDER — HEPARIN SOD (PORK) LOCK FLUSH 100 UNIT/ML IV SOLN
500.0000 [IU] | Freq: Every day | INTRAVENOUS | Status: AC | PRN
  Administered 2013-01-11: 500 [IU]
  Filled 2013-01-11: qty 5

## 2013-01-11 MED ORDER — SODIUM CHLORIDE 0.9 % IV SOLN
250.0000 mL | Freq: Once | INTRAVENOUS | Status: AC
  Administered 2013-01-11: 250 mL via INTRAVENOUS

## 2013-01-11 MED ORDER — SODIUM CHLORIDE 0.9 % IJ SOLN
10.0000 mL | INTRAMUSCULAR | Status: AC | PRN
  Administered 2013-01-11: 10 mL
  Filled 2013-01-11: qty 10

## 2013-01-11 MED ORDER — DIPHENHYDRAMINE HCL 25 MG PO CAPS
ORAL_CAPSULE | ORAL | Status: AC
  Filled 2013-01-11: qty 1

## 2013-01-12 LAB — TYPE AND SCREEN
ABO/RH(D): AB NEG
Donor AG Type: NEGATIVE
Donor AG Type: NEGATIVE

## 2013-01-18 ENCOUNTER — Telehealth: Payer: Self-pay | Admitting: Medical Oncology

## 2013-01-18 ENCOUNTER — Telehealth: Payer: Self-pay | Admitting: Family Medicine

## 2013-01-18 ENCOUNTER — Other Ambulatory Visit (HOSPITAL_BASED_OUTPATIENT_CLINIC_OR_DEPARTMENT_OTHER): Payer: Medicare Other

## 2013-01-18 DIAGNOSIS — C9200 Acute myeloblastic leukemia, not having achieved remission: Secondary | ICD-10-CM

## 2013-01-18 LAB — TECHNOLOGIST REVIEW

## 2013-01-18 LAB — HOLD TUBE, BLOOD BANK

## 2013-01-18 LAB — CBC WITH DIFFERENTIAL/PLATELET
BASO%: 0.2 % (ref 0.0–2.0)
Basophils Absolute: 0 10*3/uL (ref 0.0–0.1)
EOS%: 3.5 % (ref 0.0–7.0)
MCH: 28.7 pg (ref 25.1–34.0)
MCHC: 32 g/dL (ref 31.5–36.0)
MCV: 89.6 fL (ref 79.5–101.0)
MONO%: 0.6 % (ref 0.0–14.0)
RBC: 3.76 10*6/uL (ref 3.70–5.45)
RDW: 15.5 % — ABNORMAL HIGH (ref 11.2–14.5)
lymph#: 0.8 10*3/uL — ABNORMAL LOW (ref 0.9–3.3)
nRBC: 0 % (ref 0–0)

## 2013-01-18 NOTE — Telephone Encounter (Addendum)
[facility] called to fu on order for new testing meter, glucose testing strips and lancets  They state they have confirmation from Fax for these supplies.  They would like a verbal ok for these supplies.  [phone]  ciera

## 2013-01-18 NOTE — Telephone Encounter (Signed)
Patient here for labs today, informed pt per NP Hgb at 10.8 and no blood transfusion needed. Patient expressed thanks and verbalized understanding, denies any questions.

## 2013-01-18 NOTE — Telephone Encounter (Signed)
ciera informed

## 2013-01-25 ENCOUNTER — Telehealth: Payer: Self-pay | Admitting: Adult Health

## 2013-01-25 ENCOUNTER — Encounter: Payer: Self-pay | Admitting: Oncology

## 2013-01-25 ENCOUNTER — Other Ambulatory Visit (HOSPITAL_BASED_OUTPATIENT_CLINIC_OR_DEPARTMENT_OTHER): Payer: Medicare Other | Admitting: Lab

## 2013-01-25 ENCOUNTER — Other Ambulatory Visit: Payer: Medicare Other | Admitting: Lab

## 2013-01-25 ENCOUNTER — Ambulatory Visit (HOSPITAL_BASED_OUTPATIENT_CLINIC_OR_DEPARTMENT_OTHER): Payer: Medicare Other | Admitting: Adult Health

## 2013-01-25 VITALS — BP 151/55 | HR 81 | Temp 98.1°F | Resp 18 | Ht 65.0 in | Wt 109.2 lb

## 2013-01-25 DIAGNOSIS — C9200 Acute myeloblastic leukemia, not having achieved remission: Secondary | ICD-10-CM

## 2013-01-25 DIAGNOSIS — Z23 Encounter for immunization: Secondary | ICD-10-CM

## 2013-01-25 DIAGNOSIS — D6489 Other specified anemias: Secondary | ICD-10-CM

## 2013-01-25 DIAGNOSIS — D4620 Refractory anemia with excess of blasts, unspecified: Secondary | ICD-10-CM

## 2013-01-25 LAB — COMPREHENSIVE METABOLIC PANEL (CC13)
ALT: 30 U/L (ref 0–55)
Albumin: 3.8 g/dL (ref 3.5–5.0)
Alkaline Phosphatase: 58 U/L (ref 40–150)
BUN: 13.5 mg/dL (ref 7.0–26.0)
Creatinine: 0.8 mg/dL (ref 0.6–1.1)
Glucose: 218 mg/dl — ABNORMAL HIGH (ref 70–140)
Potassium: 4.5 mEq/L (ref 3.5–5.1)
Sodium: 137 mEq/L (ref 136–145)
Total Bilirubin: 1.14 mg/dL (ref 0.20–1.20)

## 2013-01-25 LAB — CBC WITH DIFFERENTIAL/PLATELET
BASO%: 0.1 % (ref 0.0–2.0)
EOS%: 1.1 % (ref 0.0–7.0)
HCT: 26.9 % — ABNORMAL LOW (ref 34.8–46.6)
LYMPH%: 11.1 % — ABNORMAL LOW (ref 14.0–49.7)
MCH: 29.1 pg (ref 25.1–34.0)
MCHC: 32.3 g/dL (ref 31.5–36.0)
NEUT%: 87.1 % — ABNORMAL HIGH (ref 38.4–76.8)
RBC: 2.99 10*6/uL — ABNORMAL LOW (ref 3.70–5.45)
lymph#: 0.9 10*3/uL (ref 0.9–3.3)
nRBC: 0 % (ref 0–0)

## 2013-01-25 MED ORDER — INFLUENZA VAC SPLIT QUAD 0.5 ML IM SUSP
0.5000 mL | Freq: Once | INTRAMUSCULAR | Status: AC
  Administered 2013-01-25: 0.5 mL via INTRAMUSCULAR
  Filled 2013-01-25: qty 0.5

## 2013-01-25 MED ORDER — DEFERASIROX 500 MG PO TBSO: 10.0000 mg/kg | ORAL_TABLET | Freq: Every day | ORAL | Status: DC

## 2013-01-25 NOTE — Patient Instructions (Addendum)
Doing well.  Labs are stable.  We have requested scheduling of a transfusion next week.  Please call us if you have any questions or concerns.

## 2013-01-25 NOTE — Progress Notes (Addendum)
OFFICE PROGRESS NOTE  CC  [name], MD [address] [place] [address]  DIAGNOSIS: 79 year old female with severe and profound anemia likely due to a primary bone marrow problem. 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  #2 patient has been on Procrit every 2 weeks unfortunately this has not effective and we will discontinue this.  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  #4 Patient received 3 cycles of Decitabine chemotherapy, her third cycle was complicated by febrile neutropenia and pneumonia causing hospitalization.  Since then we have not given any more chemotherapy, and have been treating her supportively ever since.   CURRENT THERAPY:Supportive care  INTERVAL HISTORY: NASHAE Rodriguez 79 y.o. female returns for follow up today for her AML. She last had a blood transfusion on 01/11/13.  She tolerated it well.  We also recently started Exjade due to her iron overload from repeated blood transfusions.  She has mild loose stools 3-4 times per week.  This started when she started the exjade.  Her appetite is good, her weight is stable, and she is drinking fluids without difficulty.  She does have a mild sore throat that started over the weekend, however denies nasal drainage, congestion or cough.  She denies fevers, night sweats, and endorses occasional fatigue as her hemoglobin decreases.  Otherwise, she is well and w/o questions/concerns.   MEDICAL HISTORY: Past Medical History  Diagnosis Date  . Hypertension   . Diabetes mellitus   . Hyperkalemia   . IBS (irritable bowel syndrome)   . Hypercholesterolemia   . Cervical radiculopathy   . Anemia   . Cancer     leukemia    ALLERGIES:  is allergic to amoxicillin; cephalexin; metformin and related; phenergan; and procardia.  MEDICATIONS:   Current Outpatient Prescriptions  Medication Sig Dispense Refill  . amLODipine (NORVASC) 10 MG tablet Take 1 tablet (10 mg total) by mouth daily.  90 tablet  3  . aspirin 81 MG tablet Take 81 mg by mouth daily.      . cholecalciferol (VITAMIN D) 1000 UNITS tablet Take 1,000 Units by mouth daily.      Marland Kitchen deferasirox (EXJADE) 500 MG disintegrating tablet Take 1 tablet (500 mg total) by mouth daily before breakfast.  30 tablet  0  . insulin glargine (LANTUS SOLOSTAR) 100 UNIT/ML injection Inject 0.1 mLs (10 Units total) into the skin daily.  5 pen  1  . insulin lispro (HUMALOG KWIKPEN) 100 UNIT/ML SOPN Inject into the skin 3 (three) times daily with meals. As directed on sliding scale      . lidocaine-prilocaine (EMLA) cream Apply topically as needed.  30 g  4  . metoprolol (LOPRESSOR) 50 MG tablet Take 1 tablet (50 mg total) by mouth 2 (two) times daily.  180 tablet  3  . Omega-3 Fatty Acids (FISH OIL) 1000 MG CAPS Take 1,000 mg by mouth daily.       . pregabalin (LYRICA) 75 MG capsule Take 75 mg by mouth as needed.      . [DISCONTINUED] glimepiride (AMARYL) 2 MG tablet Take 1 tablet (2 mg total) by mouth every morning.  30 tablet  11   Current Facility-Administered Medications  Medication Dose Route Frequency Provider Last Rate Last Dose  . TDaP (BOOSTRIX) injection 0.5 mL  0.5 mL Intramuscular Once [name], MD  SURGICAL HISTORY:  Past Surgical History  Procedure Laterality Date  . Varicose veins      surgey 1959  . Tonsilectomy, adenoidectomy, bilateral myringotomy and tubes    . Esophagogastroduodenoscopy  04/01/2011    Procedure: ESOPHAGOGASTRODUODENOSCOPY (EGD);  Surgeon: Barrie Folk, MD;  Location: [facility] ENDOSCOPY;  Service: Endoscopy;  Laterality: N/A;    REVIEW OF SYSTEMS:   A 10 point review of systems was conducted and is otherwise negative except for what is noted above.      PHYSICAL EXAMINATION: BP 151/55  Pulse 81  Temp(Src) 98.1 F (36.7 C) (Oral)   Resp 18  Ht 5\' 5"  (1.651 m)  Wt 109 lb 3.2 oz (49.533 kg)  BMI 18.17 kg/m2 General: Patient is a well appearing female in no acute distress HEENT: PERRLA, sclerae anicteric no conjunctival pallor, MMM no erythema or exudate Neck: supple, no lymphadenopathy Lungs: clear to auscultation bilaterally, no wheezes, rhonchi, or rales Cardiovascular: regular rate rhythm, S1, S2, no murmurs, rubs or gallops Abdomen: Soft, non-tender, non-distended, normoactive bowel sounds, no HSM Extremities: warm and well perfused, no clubbing, cyanosis, or edema Skin: No rashes.    Neuro: Non-focal ECOG PERFORMANCE STATUS: 1  LABORATORY DATA: Lab Results  Component Value Date   WBC 4.9 01/18/2013   HGB 10.8* 01/18/2013   HCT 33.7* 01/18/2013   MCV 89.6 01/18/2013   PLT Clumped Platelets--Appears Adequate 01/18/2013      Chemistry      Component Value Date/Time   NA 140 12/21/2012 1018   NA 138 05/25/2012 0550   K 4.4 12/21/2012 1018   K 3.9 05/25/2012 0550   CL 99 06/01/2012 1028   CL 100 05/25/2012 0550   CO2 28 12/21/2012 1018   CO2 33* 05/25/2012 0550   BUN 21.0 12/21/2012 1018   BUN 16 05/25/2012 0550   CREATININE 0.7 12/21/2012 1018   CREATININE 0.54 05/25/2012 0550   CREATININE 0.69 07/18/2010 1202   GLU 215* 02/26/2012 1255      Component Value Date/Time   CALCIUM 9.5 12/21/2012 1018   CALCIUM 9.0 05/25/2012 0550   ALKPHOS 55 12/21/2012 1018   ALKPHOS 42 05/18/2012 0955   AST 23 12/21/2012 1018   AST 16 05/18/2012 0955   ALT 29 12/21/2012 1018   ALT 19 05/18/2012 0955   BILITOT 0.68 12/21/2012 1018   BILITOT 1.1 05/18/2012 0955       RADIOGRAPHIC STUDIES:  No results found.  ASSESSMENT: 79 year old female with:  1.  severe anemia due to primary refractory anemia.  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  #3 Patient was started on Decitabine chemotherapy.  She received three cycles of this, and then had an episode of febrile  neutropenia resulting in pneumonia and hospitalization.  Her chemotherapy has been discontinued due to her complication of febrile neutropenia and pneumonia.  Much more chemotherapy could take her life, therefore, we will continue with supportive care only.  She receives blood transfusions every 3-4 weeks.    #3 Iron overload:  Ferritin measured on 11/30/12 was 3632, iron 207.  She was started on Exjade on 8/29.  She will continue this indefinitely.    PLAN:  #1 Ms. Harten is here for her monthly visit.  Her labs have again done well this month.  She last was transfused 2 weeks ago.  She will receive her flu shot today.    #2 Patient is on oral iron chelation and taking Exjade daily.  She is tolerating it well. I ordered this again today.      #3 Ms. Chapdelaine will return for weekly lab visits, and we will see her back in 4 weeks.  She will need a blood transfusion next week.    All questions were answered. The patient knows to call the clinic with any problems, questions or concerns. We can certainly see the patient much sooner if necessary.  I spent 25 minutes counseling the patient face to face. The total time spent in the appointment was 30 minutes.  Cherie Ouch Lyn Hollingshead, NP Medical Oncology [facility] Phone: [phone] 01/25/2013, 9:10 AM  ATTENDING'S ATTESTATION:  I personally reviewed patient's chart, examined patient myself, formulated the treatment plan as followed.    Patient continues to do well with supportive therapy. She is transfusion dependent. She is also iron overloaded. She was begun on Exjade. She is tolerating it well. I have discussed the rationale with the family regarding the use of Exjade. They do understand that this will continue indefinitely to try to keep her from becoming iron overloaded. Patient will continue to be seen and treated for symptomatic anemia.  Drue Second, MD Medical/Oncology [facility] [phone]  (beeper) [phone] (Office)

## 2013-01-26 LAB — HOLD TUBE, BLOOD BANK

## 2013-01-27 IMAGING — CR DG CHEST 2V
2 series · 2 of 2 positions shown · non-contrast
Comparison: 10/15/2010.

CLINICAL DATA: Coughing and fever.  Shortness of breath.

CHEST - 2 VIEW

[view not recorded (1 of 2)]
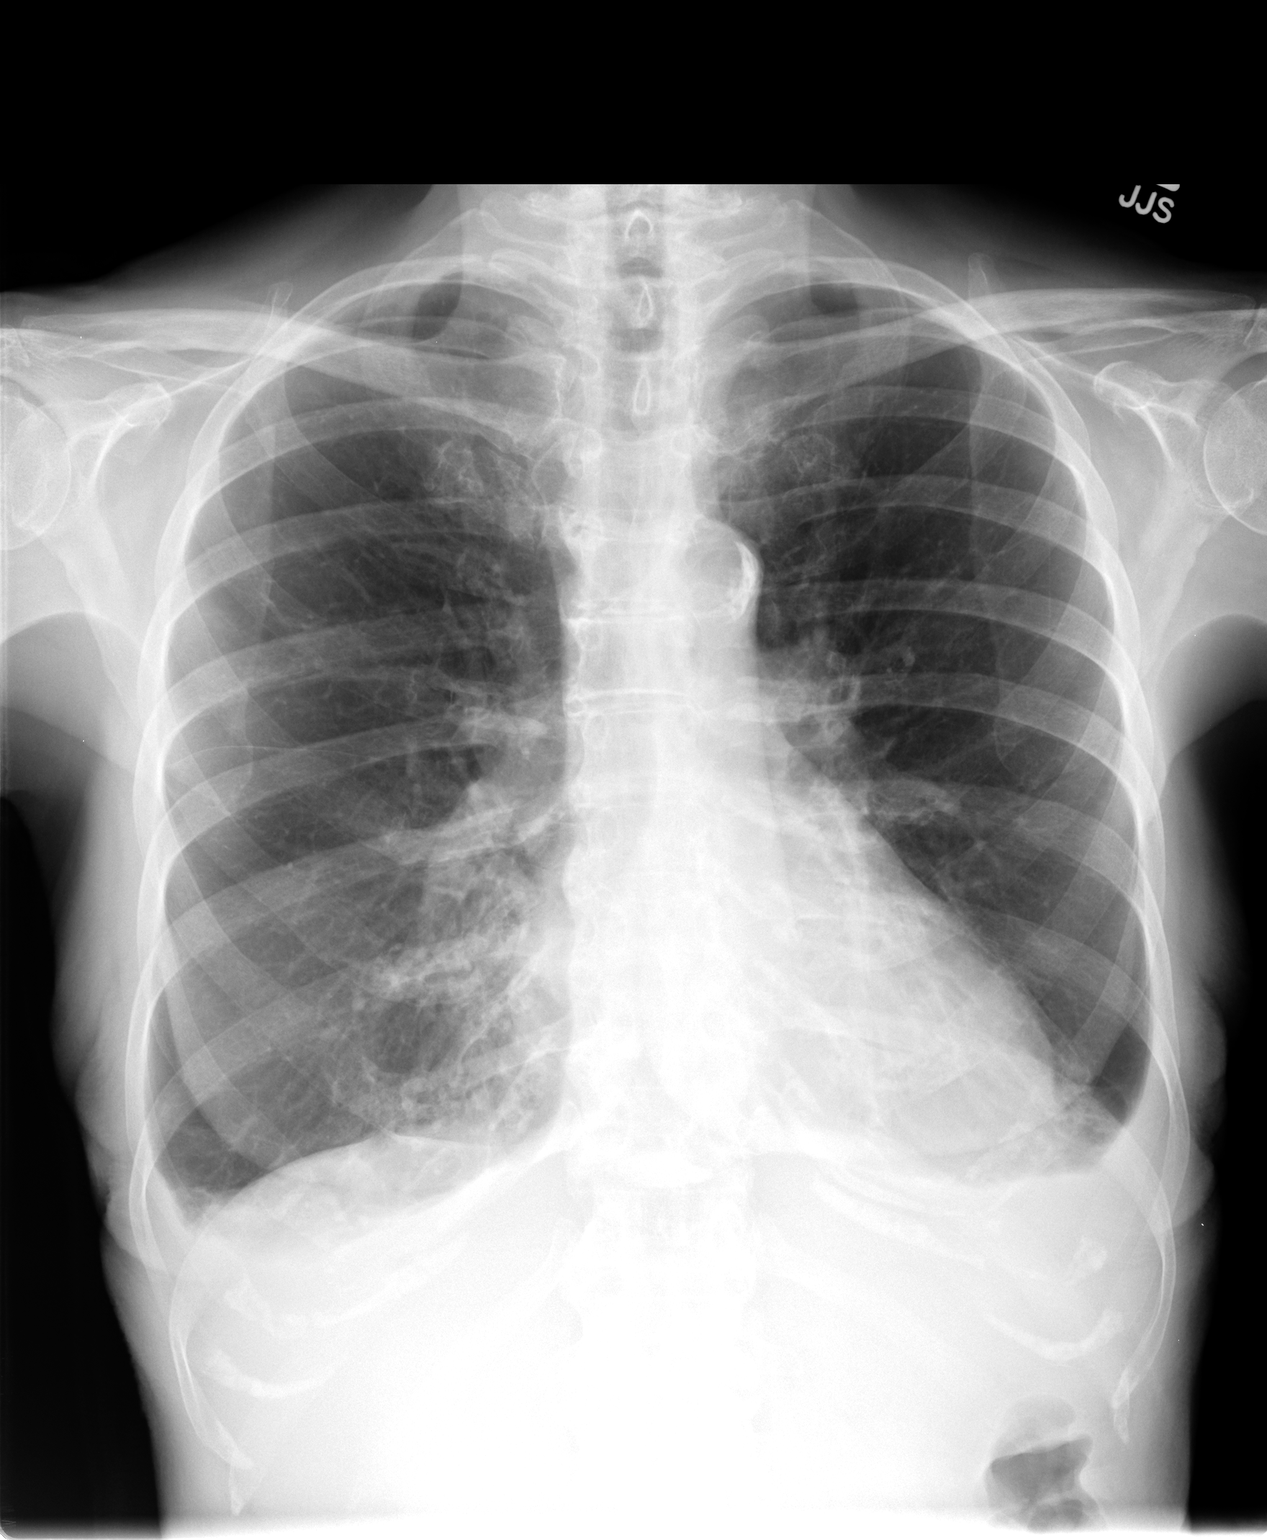

[view not recorded (2 of 2)]
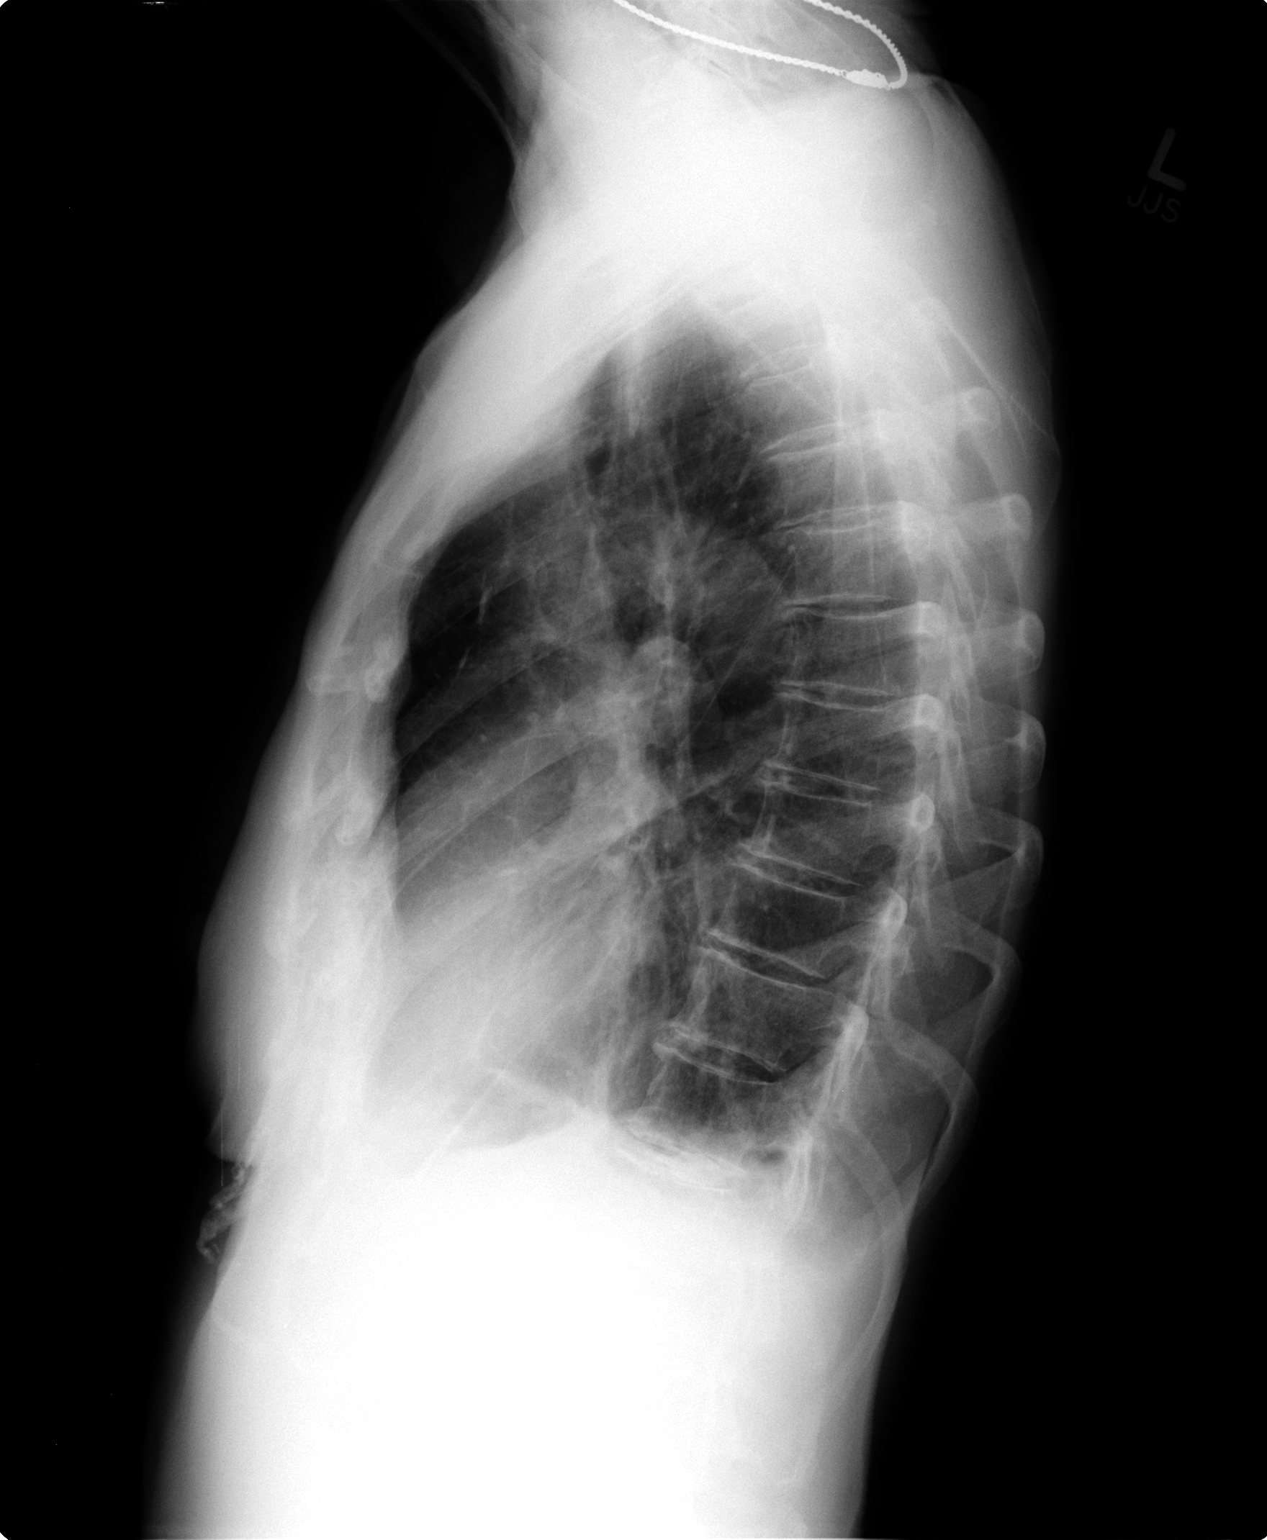

[2 of 2 positions shown; findings below may reference images not displayed]

FINDINGS: The cardiac silhouette is normal size and shape. Ectasia
and nonaneurysmal calcification of the thoracic aorta are seen.
There is generalized overall hyperinflation configuration with
flattening of diaphragm on lateral image.  Basilar atelectasis and
infiltrative densities are seen with associated small pleural
effusions more on the left in the right.  There is osteopenic
appearance of the bones.  Changes of degenerative disc disease and
degenerative spondylosis.
IMPRESSION: COPD configuration.  Basilar atelectasis and infiltrative
densities.  Associated small pleural effusions more on the left
than the right.

## 2013-01-29 ENCOUNTER — Ambulatory Visit (INDEPENDENT_AMBULATORY_CARE_PROVIDER_SITE_OTHER): Payer: Medicare Other | Admitting: Family Medicine

## 2013-01-29 ENCOUNTER — Encounter: Payer: Self-pay | Admitting: Family Medicine

## 2013-01-29 VITALS — BP 142/62 | HR 74 | Temp 98.1°F | Wt 112.0 lb

## 2013-01-29 DIAGNOSIS — C9200 Acute myeloblastic leukemia, not having achieved remission: Secondary | ICD-10-CM

## 2013-01-29 DIAGNOSIS — E1165 Type 2 diabetes mellitus with hyperglycemia: Secondary | ICD-10-CM

## 2013-01-29 DIAGNOSIS — I10 Essential (primary) hypertension: Secondary | ICD-10-CM

## 2013-01-29 DIAGNOSIS — E119 Type 2 diabetes mellitus without complications: Secondary | ICD-10-CM

## 2013-01-29 DIAGNOSIS — E785 Hyperlipidemia, unspecified: Secondary | ICD-10-CM

## 2013-01-29 LAB — HEMOGLOBIN A1C: Hgb A1c MFr Bld: 8.8 % — ABNORMAL HIGH (ref 4.6–6.5)

## 2013-01-29 LAB — HM DIABETES FOOT EXAM: HM Diabetic Foot Exam: NORMAL

## 2013-01-29 NOTE — Progress Notes (Signed)
  Subjective:    Patient ID: Meredith Rodriguez, female    DOB: [birth year], 79 y.o.   MRN: [id number]  HPI Patient here for followup multiple chronic problems She has type 2 diabetes, hypertension, hyperlipidemia, history of acute myeloid leukemia, pancytopenia. She is getting periodic transfusions through hematology. She has some fatigue but no recent dizziness or any recent falls.  Diabetes treated with Lantus 10 units once daily. She states fastings usually 112 to 140 range. She has Humalog and apparently is only taking this if her preprandial blood sugars are over 150. Denies any significant hypoglycemic episodes recently. She has already had flu vaccine.  Hypertension has been treated with metoprolol and amlodipine. No recent headaches or dizziness. No recent chest pains.  Past Medical History  Diagnosis Date  . Hypertension   . Diabetes mellitus   . Hyperkalemia   . IBS (irritable bowel syndrome)   . Hypercholesterolemia   . Cervical radiculopathy   . Anemia   . Cancer     leukemia   Past Surgical History  Procedure Laterality Date  . Varicose veins      surgey 1959  . Tonsilectomy, adenoidectomy, bilateral myringotomy and tubes    . Esophagogastroduodenoscopy  04/01/2011    Procedure: ESOPHAGOGASTRODUODENOSCOPY (EGD);  Surgeon: Barrie Folk, MD;  Location: [facility] ENDOSCOPY;  Service: Endoscopy;  Laterality: N/A;    reports that she has never smoked. She does not have any smokeless tobacco history on file. She reports that she does not drink alcohol or use illicit drugs. family history includes Cancer in her daughter, sister, sister, and sister; Diabetes in her sister; Hypertension in her father; Stroke in her mother. Allergies  Allergen Reactions  . Amoxicillin [Amoxicillin]     Upset stomach  . Cephalexin Nausea Only  . Metformin And Related     Gi problems  . Phenergan [Promethazine Hcl] Other (See Comments)    Unknown   . Procardia [Nifedipine]     dizziness     Review of  Systems  Constitutional: Negative for appetite change, fatigue and unexpected weight change.  Eyes: Negative for visual disturbance.  Respiratory: Negative for cough, chest tightness, shortness of breath and wheezing.   Cardiovascular: Negative for chest pain, palpitations and leg swelling.  Genitourinary: Negative for difficulty urinating.  Neurological: Negative for dizziness, seizures, syncope, weakness, light-headedness and headaches.       Objective:   Physical Exam  Constitutional: She appears well-developed and well-nourished.  HENT:  Mouth/Throat: Oropharynx is clear and moist.  Neck: Neck supple. No thyromegaly present.  Cardiovascular: Normal rate and regular rhythm.   Pulmonary/Chest: Effort normal and breath sounds normal. No respiratory distress. She has no wheezes. She has no rales.  Musculoskeletal: She exhibits no edema.          Assessment & Plan:  #1 type 2 diabetes. History of marginal control. Given her age and multiple comorbidities would aim for A1c 8 or less. Recheck A1c today. Glucose monitor given with instruction #2 hyperlipidemia. Recent lipids in July were okay  #3 hypertension which is stable and at goal #4 history of acute myeloid leukemia with pancytopenia. Monitored through oncology

## 2013-02-01 ENCOUNTER — Ambulatory Visit (HOSPITAL_BASED_OUTPATIENT_CLINIC_OR_DEPARTMENT_OTHER): Payer: Medicare Other

## 2013-02-01 ENCOUNTER — Ambulatory Visit (HOSPITAL_COMMUNITY)
Admission: RE | Admit: 2013-02-01 | Discharge: 2013-02-01 | Disposition: A | Discharge status: Home / Self Care | Payer: Medicare Other | Source: Ambulatory Visit | Attending: Oncology | Admitting: Oncology

## 2013-02-01 ENCOUNTER — Other Ambulatory Visit (HOSPITAL_BASED_OUTPATIENT_CLINIC_OR_DEPARTMENT_OTHER): Payer: Medicare Other | Admitting: Lab

## 2013-02-01 ENCOUNTER — Other Ambulatory Visit: Payer: Self-pay | Admitting: Emergency Medicine

## 2013-02-01 VITALS — BP 170/54 | HR 80 | Temp 98.3°F | Resp 18

## 2013-02-01 DIAGNOSIS — D649 Anemia, unspecified: Secondary | ICD-10-CM | POA: Insufficient documentation

## 2013-02-01 DIAGNOSIS — C9200 Acute myeloblastic leukemia, not having achieved remission: Secondary | ICD-10-CM | POA: Insufficient documentation

## 2013-02-01 LAB — CBC WITH DIFFERENTIAL/PLATELET
Basophils Absolute: 0 10*3/uL (ref 0.0–0.1)
Eosinophils Absolute: 0.1 10*3/uL (ref 0.0–0.5)
HCT: 22.4 % — ABNORMAL LOW (ref 34.8–46.6)
HGB: 7.2 g/dL — ABNORMAL LOW (ref 11.6–15.9)
MCHC: 32.1 g/dL (ref 31.5–36.0)
NEUT#: 7.1 10*3/uL — ABNORMAL HIGH (ref 1.5–6.5)
RDW: 15.6 % — ABNORMAL HIGH (ref 11.2–14.5)
WBC: 8.1 10*3/uL (ref 3.9–10.3)
lymph#: 0.9 10*3/uL (ref 0.9–3.3)
nRBC: 0 % (ref 0–0)

## 2013-02-01 LAB — PREPARE RBC (CROSSMATCH)

## 2013-02-01 LAB — HOLD TUBE, BLOOD BANK

## 2013-02-01 LAB — TECHNOLOGIST REVIEW

## 2013-02-01 MED ORDER — DIPHENHYDRAMINE HCL 25 MG PO CAPS
25.0000 mg | ORAL_CAPSULE | Freq: Once | ORAL | Status: AC
  Administered 2013-02-01: 25 mg via ORAL

## 2013-02-01 MED ORDER — SODIUM CHLORIDE 0.9 % IJ SOLN
10.0000 mL | INTRAMUSCULAR | Status: AC | PRN
  Administered 2013-02-01: 10 mL
  Filled 2013-02-01: qty 10

## 2013-02-01 MED ORDER — ACETAMINOPHEN 325 MG PO TABS
650.0000 mg | ORAL_TABLET | Freq: Once | ORAL | Status: AC
  Administered 2013-02-01: 650 mg via ORAL

## 2013-02-01 MED ORDER — DIPHENHYDRAMINE HCL 25 MG PO CAPS
ORAL_CAPSULE | ORAL | Status: AC
  Filled 2013-02-01: qty 1

## 2013-02-01 MED ORDER — HEPARIN SOD (PORK) LOCK FLUSH 100 UNIT/ML IV SOLN
500.0000 [IU] | Freq: Every day | INTRAVENOUS | Status: AC | PRN
  Administered 2013-02-01: 500 [IU]
  Filled 2013-02-01: qty 5

## 2013-02-01 MED ORDER — SODIUM CHLORIDE 0.9 % IV SOLN
250.0000 mL | Freq: Once | INTRAVENOUS | Status: AC
  Administered 2013-02-01: 250 mL via INTRAVENOUS

## 2013-02-01 MED ORDER — ACETAMINOPHEN 325 MG PO TABS
ORAL_TABLET | ORAL | Status: AC
  Filled 2013-02-01: qty 2

## 2013-02-01 MED ORDER — SODIUM CHLORIDE 0.9 % IJ SOLN
3.0000 mL | INTRAMUSCULAR | Status: DC | PRN
  Filled 2013-02-01: qty 10

## 2013-02-01 NOTE — Patient Instructions (Addendum)
Blood Transfusion Information WHAT IS A BLOOD TRANSFUSION? A transfusion is the replacement of blood or some of its parts. Blood is made up of multiple cells which provide different functions.  Red blood cells carry oxygen and are used for blood loss replacement.  White blood cells fight against infection.  Platelets control bleeding.  Plasma helps clot blood.  Other blood products are available for specialized needs, such as hemophilia or other clotting disorders. BEFORE THE TRANSFUSION  Who gives blood for transfusions?   You may be able to donate blood to be used at a later date on yourself (autologous donation).  Relatives can be asked to donate blood. This is generally not any safer than if you have received blood from a stranger. The same precautions are taken to ensure safety when a relative's blood is donated.  Healthy volunteers who are fully evaluated to make sure their blood is safe. This is blood bank blood. Transfusion therapy is the safest it has ever been in the practice of medicine. Before blood is taken from a donor, a complete history is taken to make sure that person has no history of diseases nor engages in risky social behavior (examples are intravenous drug use or sexual activity with multiple partners). The donor's travel history is screened to minimize risk of transmitting infections, such as malaria. The donated blood is tested for signs of infectious diseases, such as HIV and hepatitis. The blood is then tested to be sure it is compatible with you in order to minimize the chance of a transfusion reaction. If you or a relative donates blood, this is often done in anticipation of surgery and is not appropriate for emergency situations. It takes many days to process the donated blood. RISKS AND COMPLICATIONS Although transfusion therapy is very safe and saves many lives, the main dangers of transfusion include:   Getting an infectious disease.  Developing a  transfusion reaction. This is an allergic reaction to something in the blood you were given. Every precaution is taken to prevent this. The decision to have a blood transfusion has been considered carefully by your caregiver before blood is given. Blood is not given unless the benefits outweigh the risks. AFTER THE TRANSFUSION  Right after receiving a blood transfusion, you will usually feel much better and more energetic. This is especially true if your red blood cells have gotten low (anemic). The transfusion raises the level of the red blood cells which carry oxygen, and this usually causes an energy increase.  The nurse administering the transfusion will monitor you carefully for complications. HOME CARE INSTRUCTIONS  No special instructions are needed after a transfusion. You may find your energy is better. Speak with your caregiver about any limitations on activity for underlying diseases you may have. SEEK MEDICAL CARE IF:   Your condition is not improving after your transfusion.  You develop redness or irritation at the intravenous (IV) site. SEEK IMMEDIATE MEDICAL CARE IF:  Any of the following symptoms occur over the next 12 hours:  Shaking chills.  You have a temperature by mouth above 102 F (38.9 C), not controlled by medicine.  Chest, back, or muscle pain.  People around you feel you are not acting correctly or are confused.  Shortness of breath or difficulty breathing.  Dizziness and fainting.  You get a rash or develop hives.  You have a decrease in urine output.  Your urine turns a dark color or changes to pink, red, or brown. Any of the following   symptoms occur over the next 10 days:  You have a temperature by mouth above 102 F (38.9 C), not controlled by medicine.  Shortness of breath.  Weakness after normal activity.  The white part of the eye turns yellow (jaundice).  You have a decrease in the amount of urine or are urinating less often.  Your  urine turns a dark color or changes to pink, red, or brown. Document Released: 04/12/2000 Document Revised: 07/08/2011 Document Reviewed: 11/30/2007 [employer] Patient Information 2014 [employer].  

## 2013-02-02 ENCOUNTER — Telehealth: Payer: Self-pay | Admitting: Family Medicine

## 2013-02-02 LAB — TYPE AND SCREEN
ABO/RH(D): AB NEG
Antibody Screen: NEGATIVE
Donor AG Type: NEGATIVE
Unit division: 0

## 2013-02-02 NOTE — Telephone Encounter (Signed)
Pt states she is returning your call °

## 2013-02-03 NOTE — Telephone Encounter (Signed)
Pt informed about lab results. ° °

## 2013-02-05 ENCOUNTER — Telehealth: Payer: Self-pay | Admitting: Family Medicine

## 2013-02-05 NOTE — Telephone Encounter (Signed)
daughter needs you to go over the instructions for pt's insulin. Pt is unclear and does not remember what you told her.

## 2013-02-05 NOTE — Telephone Encounter (Signed)
Daughter is infomed

## 2013-02-05 NOTE — Telephone Encounter (Signed)
daughter asked if you could pls call again. Sorry she missed your call.

## 2013-02-05 NOTE — Telephone Encounter (Signed)
Left message on HOME phone for the daughter to call back

## 2013-02-07 ENCOUNTER — Other Ambulatory Visit: Payer: Self-pay

## 2013-02-07 ENCOUNTER — Telehealth: Payer: Self-pay | Admitting: Family Medicine

## 2013-02-07 ENCOUNTER — Inpatient Hospital Stay (HOSPITAL_COMMUNITY)
Admission: EM | Admit: 2013-02-07 | Discharge: 2013-02-12 | DRG: 194 | Disposition: A | Discharge status: Home / Self Care | Payer: Medicare Other | Attending: Internal Medicine | Admitting: Internal Medicine

## 2013-02-07 ENCOUNTER — Emergency Department (HOSPITAL_COMMUNITY): Payer: Medicare Other

## 2013-02-07 ENCOUNTER — Encounter (HOSPITAL_COMMUNITY): Payer: Self-pay | Admitting: Emergency Medicine

## 2013-02-07 DIAGNOSIS — IMO0001 Reserved for inherently not codable concepts without codable children: Secondary | ICD-10-CM | POA: Diagnosis present

## 2013-02-07 DIAGNOSIS — Z794 Long term (current) use of insulin: Secondary | ICD-10-CM

## 2013-02-07 DIAGNOSIS — D649 Anemia, unspecified: Secondary | ICD-10-CM | POA: Diagnosis present

## 2013-02-07 DIAGNOSIS — E785 Hyperlipidemia, unspecified: Secondary | ICD-10-CM | POA: Diagnosis present

## 2013-02-07 DIAGNOSIS — E1165 Type 2 diabetes mellitus with hyperglycemia: Secondary | ICD-10-CM

## 2013-02-07 DIAGNOSIS — D899 Disorder involving the immune mechanism, unspecified: Secondary | ICD-10-CM | POA: Diagnosis present

## 2013-02-07 DIAGNOSIS — I10 Essential (primary) hypertension: Secondary | ICD-10-CM | POA: Diagnosis present

## 2013-02-07 DIAGNOSIS — E871 Hypo-osmolality and hyponatremia: Secondary | ICD-10-CM | POA: Diagnosis present

## 2013-02-07 DIAGNOSIS — N390 Urinary tract infection, site not specified: Secondary | ICD-10-CM | POA: Diagnosis present

## 2013-02-07 DIAGNOSIS — C9200 Acute myeloblastic leukemia, not having achieved remission: Secondary | ICD-10-CM | POA: Diagnosis present

## 2013-02-07 DIAGNOSIS — Z7982 Long term (current) use of aspirin: Secondary | ICD-10-CM

## 2013-02-07 DIAGNOSIS — Z856 Personal history of leukemia: Secondary | ICD-10-CM

## 2013-02-07 DIAGNOSIS — Z880 Allergy status to penicillin: Secondary | ICD-10-CM

## 2013-02-07 DIAGNOSIS — K5900 Constipation, unspecified: Secondary | ICD-10-CM

## 2013-02-07 DIAGNOSIS — D469 Myelodysplastic syndrome, unspecified: Secondary | ICD-10-CM | POA: Diagnosis present

## 2013-02-07 DIAGNOSIS — Z9221 Personal history of antineoplastic chemotherapy: Secondary | ICD-10-CM

## 2013-02-07 DIAGNOSIS — J189 Pneumonia, unspecified organism: Principal | ICD-10-CM | POA: Diagnosis present

## 2013-02-07 DIAGNOSIS — J90 Pleural effusion, not elsewhere classified: Secondary | ICD-10-CM | POA: Diagnosis present

## 2013-02-07 DIAGNOSIS — Z79899 Other long term (current) drug therapy: Secondary | ICD-10-CM

## 2013-02-07 HISTORY — DX: Leukemia, unspecified not having achieved remission: C95.90

## 2013-02-07 LAB — COMPREHENSIVE METABOLIC PANEL
AST: 33 U/L (ref 0–37)
Albumin: 3.2 g/dL — ABNORMAL LOW (ref 3.5–5.2)
BUN: 19 mg/dL (ref 6–23)
Calcium: 8.8 mg/dL (ref 8.4–10.5)
Chloride: 89 mEq/L — ABNORMAL LOW (ref 96–112)
Creatinine, Ser: 0.78 mg/dL (ref 0.50–1.10)
GFR calc Af Amer: 86 mL/min — ABNORMAL LOW (ref >90)
GFR calc non Af Amer: 74 mL/min — ABNORMAL LOW (ref >90)
Glucose, Bld: 271 mg/dL — ABNORMAL HIGH (ref 70–99)
Potassium: 3.9 mEq/L (ref 3.5–5.1)
Total Protein: 6.5 g/dL (ref 6.0–8.3)

## 2013-02-07 LAB — CBC WITH DIFFERENTIAL/PLATELET
Basophils Absolute: 0 10*3/uL (ref 0.0–0.1)
Basophils Relative: 0 % (ref 0–1)
HCT: 25.8 % — ABNORMAL LOW (ref 36.0–46.0)
Lymphocytes Relative: 7 % — ABNORMAL LOW (ref 12–46)
MCHC: 34.5 g/dL (ref 30.0–36.0)
Monocytes Absolute: 0.1 10*3/uL (ref 0.1–1.0)
Neutro Abs: 12.3 10*3/uL — ABNORMAL HIGH (ref 1.7–7.7)
Neutrophils Relative %: 92 % — ABNORMAL HIGH (ref 43–77)
RBC: 3.03 MIL/uL — ABNORMAL LOW (ref 3.87–5.11)
RDW: 15 % (ref 11.5–15.5)
WBC Morphology: INCREASED
WBC: 13.3 10*3/uL — ABNORMAL HIGH (ref 4.0–10.5)
nRBC: 0 /100 WBC

## 2013-02-07 LAB — URINALYSIS, ROUTINE W REFLEX MICROSCOPIC
Glucose, UA: NEGATIVE mg/dL
Nitrite: NEGATIVE
Protein, ur: 30 mg/dL — AB
Specific Gravity, Urine: 1.014 (ref 1.005–1.030)

## 2013-02-07 LAB — URINE MICROSCOPIC-ADD ON

## 2013-02-07 LAB — SAMPLE TO BLOOD BANK

## 2013-02-07 MED ORDER — AMLODIPINE BESYLATE 10 MG PO TABS
10.0000 mg | ORAL_TABLET | Freq: Every day | ORAL | Status: DC
  Administered 2013-02-08 – 2013-02-12 (×5): 10 mg via ORAL
  Filled 2013-02-07 (×5): qty 1

## 2013-02-07 MED ORDER — ASPIRIN 81 MG PO CHEW
81.0000 mg | CHEWABLE_TABLET | Freq: Every day | ORAL | Status: DC
  Administered 2013-02-08 – 2013-02-12 (×5): 81 mg via ORAL
  Filled 2013-02-07 (×5): qty 1

## 2013-02-07 MED ORDER — SODIUM CHLORIDE 0.9 % IV BOLUS (SEPSIS)
500.0000 mL | Freq: Once | INTRAVENOUS | Status: AC
  Administered 2013-02-07: 500 mL via INTRAVENOUS

## 2013-02-07 MED ORDER — SIMVASTATIN 5 MG PO TABS
5.0000 mg | ORAL_TABLET | Freq: Every day | ORAL | Status: DC
  Administered 2013-02-08 – 2013-02-11 (×5): 5 mg via ORAL
  Filled 2013-02-07 (×7): qty 1

## 2013-02-07 MED ORDER — METOPROLOL TARTRATE 50 MG PO TABS
50.0000 mg | ORAL_TABLET | Freq: Two times a day (BID) | ORAL | Status: DC
  Administered 2013-02-08 – 2013-02-12 (×10): 50 mg via ORAL
  Filled 2013-02-07 (×11): qty 1

## 2013-02-07 MED ORDER — SODIUM CHLORIDE 0.9 % IJ SOLN
3.0000 mL | Freq: Two times a day (BID) | INTRAMUSCULAR | Status: DC
  Administered 2013-02-09 – 2013-02-12 (×5): 3 mL via INTRAVENOUS

## 2013-02-07 MED ORDER — ACETAMINOPHEN 325 MG PO TABS: 650.0000 mg | ORAL_TABLET | Freq: Four times a day (QID) | ORAL | Status: DC | PRN

## 2013-02-07 MED ORDER — ONDANSETRON HCL 4 MG PO TABS: 4.0000 mg | ORAL_TABLET | Freq: Four times a day (QID) | ORAL | Status: DC | PRN

## 2013-02-07 MED ORDER — INSULIN ASPART 100 UNIT/ML ~~LOC~~ SOLN
0.0000 [IU] | Freq: Three times a day (TID) | SUBCUTANEOUS | Status: DC
  Administered 2013-02-08: 09:00:00 2 [IU] via SUBCUTANEOUS
  Administered 2013-02-08 (×2): 3 [IU] via SUBCUTANEOUS
  Administered 2013-02-09: 2 [IU] via SUBCUTANEOUS

## 2013-02-07 MED ORDER — DEFERASIROX 500 MG PO TBSO
10.0000 mg/kg | ORAL_TABLET | Freq: Every day | ORAL | Status: DC
  Administered 2013-02-10: 500 mg via ORAL
  Filled 2013-02-07 (×5): qty 1

## 2013-02-07 MED ORDER — ACETAMINOPHEN 650 MG RE SUPP: 650.0000 mg | Freq: Four times a day (QID) | RECTAL | Status: DC | PRN

## 2013-02-07 MED ORDER — VITAMIN D3 25 MCG (1000 UNIT) PO TABS
1000.0000 [IU] | ORAL_TABLET | Freq: Every day | ORAL | Status: DC
  Administered 2013-02-08 – 2013-02-12 (×5): 1000 [IU] via ORAL
  Filled 2013-02-07 (×5): qty 1

## 2013-02-07 MED ORDER — OMEGA-3-ACID ETHYL ESTERS 1 G PO CAPS
1000.0000 mg | ORAL_CAPSULE | Freq: Every day | ORAL | Status: DC
  Administered 2013-02-08 – 2013-02-12 (×5): 1000 mg via ORAL
  Filled 2013-02-07 (×5): qty 1

## 2013-02-07 MED ORDER — INSULIN GLARGINE 100 UNIT/ML ~~LOC~~ SOLN
10.0000 [IU] | Freq: Every day | SUBCUTANEOUS | Status: DC
  Administered 2013-02-08: 10 [IU] via SUBCUTANEOUS
  Filled 2013-02-07 (×2): qty 0.1

## 2013-02-07 MED ORDER — LEVOFLOXACIN IN D5W 750 MG/150ML IV SOLN
750.0000 mg | Freq: Once | INTRAVENOUS | Status: AC
  Administered 2013-02-07: 750 mg via INTRAVENOUS
  Filled 2013-02-07: qty 150

## 2013-02-07 MED ORDER — ONDANSETRON HCL 4 MG/2ML IJ SOLN: 4.0000 mg | Freq: Four times a day (QID) | INTRAMUSCULAR | Status: DC | PRN

## 2013-02-07 MED ORDER — SODIUM CHLORIDE 0.9 % IV SOLN
INTRAVENOUS | Status: AC
  Administered 2013-02-08: 01:00:00 via INTRAVENOUS
  Administered 2013-02-08: 1000 mL via INTRAVENOUS

## 2013-02-07 NOTE — ED Notes (Signed)
Pt reports not feeling well x 3-4 days.  Onset 2-3 days urinary burning and blood in urine.  Fever 102 today, took Acetaminophen 1 1/2 hours .  Pt gave urine sample on arrival to room.

## 2013-02-07 NOTE — H&P (Signed)
[facility] History and Physical  TACHE BOBST ZOX:[id number] DOB: 03-19-1926 DOA: 02/07/2013  Referring physician: ER physician. PCP: [name], MD  Specialists: Dr. Welton Flakes.  Chief Complaint: Fever and weakness.  HPI: Meredith Rodriguez is a 79 y.o. female history of a.m. on, diabetes mellitus type 2, anemia had transfusion last week presents to the ER because of increasing weakness. Patient checked her temperature today and was found to be febrile with temperature of around 102F at home. Patient also had some blood-tinged urine. Patient has been experiencing some nonproductive cough over the last one week. In the ER chest x-ray shows features concerning for pneumonia and UA shows features of UTI. Patient has been admitted for further management in addition labs also show mild hyponatremia.   Review of Systems: As presented in the history of presenting illness, rest negative.  Past Medical History  Diagnosis Date  . Hypertension   . Diabetes mellitus   . Hyperkalemia   . IBS (irritable bowel syndrome)   . Hypercholesterolemia   . Cervical radiculopathy   . Anemia   . Cancer     leukemia  . Leukemia    Past Surgical History  Procedure Laterality Date  . Varicose veins      surgey 1959  . Tonsilectomy, adenoidectomy, bilateral myringotomy and tubes    . Esophagogastroduodenoscopy  04/01/2011    Procedure: ESOPHAGOGASTRODUODENOSCOPY (EGD);  Surgeon: Barrie Folk, MD;  Location: [facility] ENDOSCOPY;  Service: Endoscopy;  Laterality: N/A;   Social History:  reports that she has never smoked. She does not have any smokeless tobacco history on file. She reports that she does not drink alcohol or use illicit drugs. Where does patient live home. Can patient participate in ADLs? Yes.  Allergies  Allergen Reactions  . Amoxicillin [Amoxicillin]     Upset stomach  . Cephalexin Nausea Only  . Metformin And Related     Gi problems  . Phenergan [Promethazine Hcl] Other (See Comments)     Unknown   . Procardia [Nifedipine]     dizziness    Family History:  Family History  Problem Relation Age of Onset  . Stroke Mother   . Hypertension Father   . Cancer Sister   . Cancer Sister   . Diabetes Sister   . Cancer Sister     colon cancer  . Cancer Daughter     breast cancer      Prior to Admission medications   Medication Sig Start Date End Date Taking? Authorizing Provider  amLODipine (NORVASC) 10 MG tablet Take 1 tablet (10 mg total) by mouth daily. 11/24/12  Yes Nicki Reaper, NP  aspirin 81 MG tablet Take 81 mg by mouth daily.   Yes Historical Provider, MD  cholecalciferol (VITAMIN D) 1000 UNITS tablet Take 1,000 Units by mouth daily.   Yes Historical Provider, MD  deferasirox (EXJADE) 500 MG disintegrating tablet Take 1 tablet (500 mg total) by mouth daily before breakfast. 01/25/13  Yes Augustin Schooling, NP  insulin glargine (LANTUS SOLOSTAR) 100 UNIT/ML injection Inject 0.1 mLs (10 Units total) into the skin daily. 08/07/12  Yes [name], MD  insulin lispro (HUMALOG KWIKPEN) 100 UNIT/ML SOPN Inject 4 Units into the skin 2 (two) times daily with a meal. Inject 5 units twice daily into the skin with meals. Lunch and dinner   Yes Historical Provider, MD  lidocaine-prilocaine (EMLA) cream Apply topically as needed. 02/19/12  Yes Victorino December, MD  metoprolol (LOPRESSOR) 50 MG tablet Take 1  tablet (50 mg total) by mouth 2 (two) times daily. 05/18/12  Yes Cassell Clement, MD  Omega-3 Fatty Acids (FISH OIL) 1000 MG CAPS Take 1,000 mg by mouth daily.    Yes Historical Provider, MD  simvastatin (ZOCOR) 5 MG tablet Take 5 mg by mouth at bedtime.   Yes Historical Provider, MD    Physical Exam: Filed Vitals:   02/07/13 2021 02/07/13 2045 02/07/13 2100  BP: 137/51 127/42 124/43  Pulse: 92 93 88  Temp: 98.2 F (36.8 C)    TempSrc: Oral    Resp: 18    Weight: 51.211 kg (112 lb 14.4 oz)    SpO2: 96% 97% 97%     General:  Well-developed and moderately  nourished.  Eyes: Anicteric no pallor.  ENT: No discharge from ears eyes nose mouth.  Neck: No mass felt.  Cardiovascular: S1-S2 heard.  Respiratory: No rhonchi or crepitations.  Abdomen: Soft nontender bowel sounds present.  Skin: Pale.  Musculoskeletal: No edema.  Psychiatric: Appears normal.  Neurologic: Alert awake oriented to time place and person. Moves all extremities.  Labs on Admission:  Basic Metabolic Panel:  Recent Labs Lab 02/07/13 2047  NA 124*  K 3.9  CL 89*  CO2 24  GLUCOSE 271*  BUN 19  CREATININE 0.78  CALCIUM 8.8   Liver Function Tests:  Recent Labs Lab 02/07/13 2047  AST 33  ALT 34  ALKPHOS 55  BILITOT 0.9  PROT 6.5  ALBUMIN 3.2*   No results found for this basename: LIPASE, AMYLASE,  in the last 168 hours No results found for this basename: AMMONIA,  in the last 168 hours CBC:  Recent Labs Lab 02/01/13 0820 02/07/13 2047  WBC 8.1 13.3*  NEUTROABS 7.1* 12.3*  HGB 7.2* 8.9*  HCT 22.4* 25.8*  MCV 89.2 85.1  PLT 300 Large & giant platelets 187   Cardiac Enzymes: No results found for this basename: CKTOTAL, CKMB, CKMBINDEX, TROPONINI,  in the last 168 hours  BNP (last 3 results) No results found for this basename: PROBNP,  in the last 8760 hours CBG: No results found for this basename: GLUCAP,  in the last 168 hours  Radiological Exams on Admission: Dg Chest 2 View  02/07/2013   CLINICAL DATA:  Weakness.  EXAM: CHEST  2 VIEW  COMPARISON:  November 26, 2012.  FINDINGS: Cardiomediastinal silhouette appears normal. Left lung is clear. Right internal jugular Port-A-Cath is unchanged in position. There is interval development of right basilar opacity consistent with pneumonia or atelectasis. No pneumothorax is noted.  IMPRESSION: Right lower lobe pneumonia or atelectasis. Followup radiographs are recommended.   Electronically Signed   By: Roque Lias M.D.   On: 02/07/2013 21:24     Assessment/Plan Principal Problem:   Community  acquired pneumonia Active Problems:   Anemia   UTI (lower urinary tract infection)   Hyponatremia   1. Pneumonia - we will treat this as community-acquired pneumonia with Levaquin. Check urinary Legionella and strep antigen. 2. Possible UTI - follow urine cultures. Patient is on Levaquin. 3. Weakness and hyponatremia - since patient feels weak probably patient may be dehydrated causing hyponatremia. At this time patient is receiving normal saline 500 cc bolus. Continue with normal saline at 75 cc per hour. Check urine sodium and was moderately for further direction. 4. Anemia - patient had blood transfusion last week. Closely follow CBC. 5. History of leukemia - per oncologist. 6. Diabetes mellitus type 2 uncontrolled - closely follow CBGs with sliding-scale coverage and  home dose of Lantus. Patient is receiving IV fluids. 7. History of hyperlipidemia.    Code Status: Full code.  Family Communication: Patient's husband and son at the bedside.  Disposition Plan: Admit to inpatient.    KAKRAKANDY,ARSHAD N. [facility] Pager [phone].  If 7PM-7AM, please contact night-coverage [email or web address] Password [email or web address] 02/07/2013, 11:02 PM

## 2013-02-07 NOTE — ED Notes (Signed)
Patient transported to X-ray 

## 2013-02-07 NOTE — ED Notes (Signed)
Report given to [facility], nurse

## 2013-02-07 NOTE — ED Notes (Signed)
The pt is feeling weal for 3-4 days  With an elevated temp.  She was transfused this past Monday 2 units of blood and she has seen blood in her stools  also

## 2013-02-07 NOTE — ED Provider Notes (Signed)
CSN: [id number]     Arrival date & time 02/07/13  2018 History   First MD Initiated Contact with Patient 02/07/13 2037     Chief Complaint  Patient presents with  . Weakness   (Consider location/radiation/quality/duration/timing/severity/associated sxs/prior Treatment) HPI Comments: Patient is an 79 year old female with past medical history significant for AML for which she was treated by Dr. Sander Radon. She apparently had several rounds of chemotherapy which she did not tolerate well. Since that time she has been receiving blood transfusions when her calcium dropped. Most recently, she received a transfusion last week. She also has a history of diabetes and hypertension. She presents today with complaints that she has been feeling weak for the past 3 or 4 days and states that she has been running fevers off and on. She states that she has seen some blood in her urine but denies dysuria or frequency.  Patient is a 79 y.o. female presenting with weakness. The history is provided by the patient.  Weakness This is a new problem. Episode onset: 3 or 4 day. The problem occurs constantly. The problem has been gradually worsening. Pertinent negatives include no shortness of breath. Nothing aggravates the symptoms. Nothing relieves the symptoms. She has tried nothing for the symptoms. The treatment provided no relief.    Past Medical History  Diagnosis Date  . Hypertension   . Diabetes mellitus   . Hyperkalemia   . IBS (irritable bowel syndrome)   . Hypercholesterolemia   . Cervical radiculopathy   . Anemia   . Cancer     leukemia   Past Surgical History  Procedure Laterality Date  . Varicose veins      surgey 1959  . Tonsilectomy, adenoidectomy, bilateral myringotomy and tubes    . Esophagogastroduodenoscopy  04/01/2011    Procedure: ESOPHAGOGASTRODUODENOSCOPY (EGD);  Surgeon: Barrie Folk, MD;  Location: [facility] ENDOSCOPY;  Service: Endoscopy;  Laterality: N/A;   Family History  Problem  Relation Age of Onset  . Stroke Mother   . Hypertension Father   . Cancer Sister   . Cancer Sister   . Diabetes Sister   . Cancer Sister     colon cancer  . Cancer Daughter     breast cancer   History  Substance Use Topics  . Smoking status: Never Smoker   . Smokeless tobacco: Not on file  . Alcohol Use: No   OB History   Grav Para Term Preterm Abortions TAB SAB Ect Mult Living                 Review of Systems  Constitutional: Positive for fever and fatigue. Negative for chills.  Respiratory: Negative for shortness of breath.   Genitourinary: Positive for dysuria and hematuria.  Neurological: Positive for weakness.  All other systems reviewed and are negative.    Allergies  Amoxicillin; Cephalexin; Metformin and related; Phenergan; and Procardia  Home Medications   Current Outpatient Rx  Name  Route  Sig  Dispense  Refill  . amLODipine (NORVASC) 10 MG tablet   Oral   Take 1 tablet (10 mg total) by mouth daily.   90 tablet   3   . aspirin 81 MG tablet   Oral   Take 81 mg by mouth daily.         . cholecalciferol (VITAMIN D) 1000 UNITS tablet   Oral   Take 1,000 Units by mouth daily.         Marland Kitchen deferasirox (EXJADE) 500 MG disintegrating tablet  Oral   Take 1 tablet (500 mg total) by mouth daily before breakfast.   30 tablet   0   . insulin glargine (LANTUS SOLOSTAR) 100 UNIT/ML injection   Subcutaneous   Inject 0.1 mLs (10 Units total) into the skin daily.   5 pen   1   . insulin lispro (HUMALOG KWIKPEN) 100 UNIT/ML SOPN      Inject 5 units twice daily into the skin with meals.         . lidocaine-prilocaine (EMLA) cream   Topical   Apply topically as needed.   30 g   4   . metoprolol (LOPRESSOR) 50 MG tablet   Oral   Take 1 tablet (50 mg total) by mouth 2 (two) times daily.   180 tablet   3   . Omega-3 Fatty Acids (FISH OIL) 1000 MG CAPS   Oral   Take 1,000 mg by mouth daily.          . pregabalin (LYRICA) 75 MG capsule    Oral   Take 75 mg by mouth as needed.          BP 137/51  Pulse 92  Temp(Src) 98.2 F (36.8 C) (Oral)  Resp 18  Wt 112 lb 14.4 oz (51.211 kg)  BMI 18.79 kg/m2  SpO2 96% Physical Exam  Nursing note and vitals reviewed. Constitutional: She is oriented to person, place, and time. She appears well-developed and well-nourished. No distress.  Patient is quite thin and pale. She is otherwise alert and appropriate  HENT:  Head: Normocephalic and atraumatic.  Neck: Normal range of motion. Neck supple.  Cardiovascular: Normal rate and regular rhythm.  Exam reveals no gallop and no friction rub.   No murmur heard. Pulmonary/Chest: Effort normal and breath sounds normal. No respiratory distress. She has no wheezes.  Abdominal: Soft. Bowel sounds are normal. She exhibits no distension. There is no tenderness.  Musculoskeletal: Normal range of motion.  Lymphadenopathy:    She has no cervical adenopathy.  Neurological: She is alert and oriented to person, place, and time. No cranial nerve deficit. She exhibits normal muscle tone. Coordination normal.  Skin: Skin is warm and dry. She is not diaphoretic.    ED Course  Procedures (including critical care time) Labs Review Labs Reviewed  CBC WITH DIFFERENTIAL  COMPREHENSIVE METABOLIC PANEL  URINALYSIS, ROUTINE W REFLEX MICROSCOPIC  SAMPLE TO BLOOD BANK   Imaging Review No results found.  EKG Interpretation   None       Date: 02/07/2013  Rate: 86  Rhythm: normal sinus rhythm with pacs  QRS Axis: normal  Intervals: normal  ST/T Wave abnormalities: normal  Conduction Disutrbances:right bundle branch block  Narrative Interpretation:   Old EKG Reviewed: unchanged    MDM  No diagnosis found. Patient is an 79 year old female with past medical history significant for AML. She presents today with complaints of fever for the past 2 days and cough. Is also feeling very weak. Workup today reveals an infiltrate in the right base along  with a white count of 13.3. Electrolytes reveal she is also hyponatremic with a sodium of 124. She is not hypoxic or in any respiratory distress however due to the findings today and the patient's comorbidities I feel as though admission for IV antibiotics and observation is indicated. I've spoken with Dr. Toniann Fail from [facility] who agrees to admit the patient.    Geoffery Lyons, MD 02/07/13 2245

## 2013-02-08 ENCOUNTER — Other Ambulatory Visit: Payer: Medicare Other | Admitting: Lab

## 2013-02-08 DIAGNOSIS — I10 Essential (primary) hypertension: Secondary | ICD-10-CM

## 2013-02-08 DIAGNOSIS — K5900 Constipation, unspecified: Secondary | ICD-10-CM

## 2013-02-08 DIAGNOSIS — D649 Anemia, unspecified: Secondary | ICD-10-CM

## 2013-02-08 LAB — BASIC METABOLIC PANEL
CO2: 28 mEq/L (ref 19–32)
Calcium: 9 mg/dL (ref 8.4–10.5)
GFR calc non Af Amer: 81 mL/min — ABNORMAL LOW (ref >90)
Potassium: 4.1 mEq/L (ref 3.5–5.1)
Sodium: 131 mEq/L — ABNORMAL LOW (ref 135–145)

## 2013-02-08 LAB — CBC
Hemoglobin: 9.1 g/dL — ABNORMAL LOW (ref 12.0–15.0)
MCV: 86.9 fL (ref 78.0–100.0)
Platelets: 183 10*3/uL (ref 150–400)
RBC: 3.13 MIL/uL — ABNORMAL LOW (ref 3.87–5.11)
WBC: 12.2 10*3/uL — ABNORMAL HIGH (ref 4.0–10.5)

## 2013-02-08 LAB — GLUCOSE, CAPILLARY
Glucose-Capillary: 172 mg/dL — ABNORMAL HIGH (ref 70–99)
Glucose-Capillary: 185 mg/dL — ABNORMAL HIGH (ref 70–99)
Glucose-Capillary: 226 mg/dL — ABNORMAL HIGH (ref 70–99)
Glucose-Capillary: 248 mg/dL — ABNORMAL HIGH (ref 70–99)
Glucose-Capillary: 272 mg/dL — ABNORMAL HIGH (ref 70–99)

## 2013-02-08 LAB — TSH: TSH: 8.553 u[IU]/mL — ABNORMAL HIGH (ref 0.350–4.500)

## 2013-02-08 MED ORDER — VANCOMYCIN HCL IN DEXTROSE 750-5 MG/150ML-% IV SOLN
750.0000 mg | INTRAVENOUS | Status: DC
  Administered 2013-02-08 – 2013-02-10 (×3): 750 mg via INTRAVENOUS
  Filled 2013-02-08 (×4): qty 150

## 2013-02-08 MED ORDER — SODIUM CHLORIDE 0.9 % IV SOLN
1.0000 g | Freq: Two times a day (BID) | INTRAVENOUS | Status: DC
  Administered 2013-02-08 – 2013-02-11 (×8): 1 g via INTRAVENOUS
  Filled 2013-02-08 (×11): qty 1

## 2013-02-08 NOTE — Progress Notes (Addendum)
ANTIBIOTIC CONSULT NOTE - INITIAL  Pharmacy Consult for Vancomycin Indication: aspiration PNA  Allergies  Allergen Reactions  . Amoxicillin [Amoxicillin]     Upset stomach  . Cephalexin Nausea Only  . Metformin And Related     Gi problems  . Phenergan [Promethazine Hcl] Other (See Comments)    Unknown   . Procardia [Nifedipine]     dizziness    Patient Measurements: Height: 5\' 6"  (167.6 cm) Weight: 110 lb (49.896 kg) IBW/kg (Calculated) : 59.3 Dosing weight 49.8 kg  Vital Signs: Temp: 99.6 F (37.6 C) (10/13 0457) Temp src: Oral (10/13 0457) BP: 152/71 mmHg (10/13 0457) Pulse Rate: 100 (10/13 0457) Intake/Output from previous day:   Intake/Output from this shift: Total I/O In: 240 [P.O.:240] Out: 300 [Urine:300]  Labs:  Recent Labs  02/07/13 2047 02/08/13 0510  WBC 13.3* 12.2*  HGB 8.9* 9.1*  PLT 187 183  CREATININE 0.78 0.60   Estimated Creatinine Clearance: 40.5 ml/min (by C-G formula based on Cr of 0.6). No results found for this basename: Rolm Gala, VANCORANDOM, GENTTROUGH, GENTPEAK, GENTRANDOM, TOBRATROUGH, TOBRAPEAK, TOBRARND, AMIKACINPEAK, AMIKACINTROU, AMIKACIN,  in the last 72 hours   Microbiology: Recent Results (from the past 720 hour(s))  TECHNOLOGIST REVIEW     Status: None   Collection Time    01/18/13  9:15 AM      Result Value Range Status   Technologist Review Large & giant platelets   Final  TECHNOLOGIST REVIEW     Status: None   Collection Time    01/25/13  8:34 AM      Result Value Range Status   Technologist Review few metas present   Final  TECHNOLOGIST REVIEW     Status: None   Collection Time    02/01/13  8:20 AM      Result Value Range Status   Technologist Review Rare blast, occ Metas and Myelocytes present   Final    Medical History: Past Medical History  Diagnosis Date  . Hypertension   . Diabetes mellitus   . Hyperkalemia   . IBS (irritable bowel syndrome)   . Hypercholesterolemia   . Cervical  radiculopathy   . Anemia   . Cancer     leukemia  . Leukemia     Medications:  Facility-administered medications prior to admission  Medication Dose Route Frequency Provider Last Rate Last Dose  . TDaP (BOOSTRIX) injection 0.5 mL  0.5 mL Intramuscular Once [name], MD       Prescriptions prior to admission  Medication Sig Dispense Refill  . amLODipine (NORVASC) 10 MG tablet Take 1 tablet (10 mg total) by mouth daily.  90 tablet  3  . aspirin 81 MG tablet Take 81 mg by mouth daily.      . cholecalciferol (VITAMIN D) 1000 UNITS tablet Take 1,000 Units by mouth daily.      Marland Kitchen deferasirox (EXJADE) 500 MG disintegrating tablet Take 1 tablet (500 mg total) by mouth daily before breakfast.  30 tablet  0  . insulin glargine (LANTUS SOLOSTAR) 100 UNIT/ML injection Inject 0.1 mLs (10 Units total) into the skin daily.  5 pen  1  . insulin lispro (HUMALOG KWIKPEN) 100 UNIT/ML SOPN Inject 4 Units into the skin 2 (two) times daily with a meal. Inject 5 units twice daily into the skin with meals. Lunch and dinner      . lidocaine-prilocaine (EMLA) cream Apply topically as needed.  30 g  4  . metoprolol (LOPRESSOR) 50 MG tablet  Take 1 tablet (50 mg total) by mouth 2 (two) times daily.  180 tablet  3  . Omega-3 Fatty Acids (FISH OIL) 1000 MG CAPS Take 1,000 mg by mouth daily.       . simvastatin (ZOCOR) 5 MG tablet Take 5 mg by mouth at bedtime.       Scheduled:  . amLODipine  10 mg Oral Daily  . aspirin  81 mg Oral Daily  . cholecalciferol  1,000 Units Oral Daily  . deferasirox  10 mg/kg Oral QAC breakfast  . insulin aspart  0-9 Units Subcutaneous TID WC  . insulin glargine  10 Units Subcutaneous Daily  . meropenem (MERREM) IV  1 g Intravenous Q12H  . metoprolol  50 mg Oral BID  . omega-3 acid ethyl esters  1,000 mg Oral Daily  . simvastatin  5 mg Oral QHS  . sodium chloride  3 mL Intravenous Q12H     Assessment: Adding IV vancomycin to antibiotic regimen for PNA in this 79 yo  female patient noted with history of myelodysplastic syndrome. Patient was last on chemotherapy 04/2012 and admitted with febrile neutropenia 05/17/12. WBC= 12.3, afeb, SCr= 0.6, CrCl ~40. Temp 99.6  Abx 10/13 merropenem>> 10/13 vancomycin  Cx 10/13 blood x2 10/13 urine  Goal:  Vancomycin trough 15-20 mcg/ml  Plan:  -Start Vancomycin 750 mg IV q24h -Continue Meropenem 1gm IV q12h -When ready for po antibiotics may be able to consider augmentin as allergy history reflects GI intolerance and not a true amoxicillin allergy. If GI tolerance is a barrier could also consider avelox. -Will follow renal function, cultures and clinical progress Vancomycin trough at steady state if needed per protocol.  Meredith Rodriguez, RPh Clinical Pharmacist Pager: [phone]  02/08/2013 12:15 PM

## 2013-02-08 NOTE — Evaluation (Signed)
Physical Therapy Evaluation Patient Details Name: Meredith Rodriguez MRN: [id number] DOB: 10-19-1925 Today's Date: 02/08/2013 Time: 1478-2956 PT Time Calculation (min): 14 min  PT Assessment / Plan / Recommendation History of Present Illness  Meredith Rodriguez is a 79 y.o. female history of a.m. on, diabetes mellitus type 2, anemia had transfusion last week presents to the ER because of increasing weakness. Patient checked her temperature today and was found to be febrile with temperature of around 102F at home. Patient also had some blood-tinged urine. Patient has been experiencing some nonproductive cough over the last one week. In the ER chest x-ray shows features concerning for pneumonia and UA shows features of UTI.  Clinical Impression  Very pleasant lady who reports feeling fatigued in general after receiving transfusion for leukemia. Pt at baseline functional status able to mobilize without assist, go up and down a step, able to simulate stepping over tub and don/doff socks all without difficulty or LOB with support for stability as needed. No further acute needs, pt aware and agreeable. Pt encouraged to continue ambulating daily.    PT Assessment  Patent does not need any further PT services    Follow Up Recommendations  No PT follow up    Does the patient have the potential to tolerate intense rehabilitation      Barriers to Discharge        Equipment Recommendations  None recommended by PT    Recommendations for Other Services     Frequency      Precautions / Restrictions Precautions Precautions: None   Pertinent Vitals/Pain No pain sats 90-92% on Ra HR109      Mobility  Bed Mobility Bed Mobility: Not assessed Transfers Transfers: Sit to Stand;Stand to Sit Sit to Stand: 6: Modified independent (Device/Increase time);From chair/3-in-1 Stand to Sit: 6: Modified independent (Device/Increase time);To chair/3-in-1 Ambulation/Gait Ambulation/Gait Assistance: 6: Modified  independent (Device/Increase time) Ambulation Distance (Feet): 450 Feet Assistive device: None Ambulation/Gait Assistance Details: pt with slow cautious gait without LOB  Gait Pattern: Step-through pattern;Decreased stride length Gait velocity: decreased Stairs: Yes Stairs Assistance: 6: Modified independent (Device/Increase time) Stair Management Technique: One rail Left Number of Stairs: 1    Exercises     PT Diagnosis:    PT Problem List:   PT Treatment Interventions:       PT Goals(Current goals can be found in the care plan section) Acute Rehab PT Goals Patient Stated Goal: return home  PT Goal Formulation: No goals set, d/c therapy  Visit Information  Last PT Received On: 02/08/13 Assistance Needed: +1 History of Present Illness: Meredith Rodriguez is a 79 y.o. female history of a.m. on, diabetes mellitus type 2, anemia had transfusion last week presents to the ER because of increasing weakness. Patient checked her temperature today and was found to be febrile with temperature of around 102F at home. Patient also had some blood-tinged urine. Patient has been experiencing some nonproductive cough over the last one week. In the ER chest x-ray shows features concerning for pneumonia and UA shows features of UTI.       Prior Functioning  Home Living Family/patient expects to be discharged to:: Private residence Living Arrangements: Spouse/significant other Available Help at Discharge: Family;Available 24 hours/day Type of Home: House Home Access: Stairs to enter [employer] of Steps: 1 Entrance Stairs-Rails: None Home Layout: One level Home Equipment: Cane - single point Prior Function Level of Independence: Independent Communication Communication: No difficulties    Cognition  Cognition Arousal/Alertness:  Awake/alert Behavior During Therapy: WFL for tasks assessed/performed Overall Cognitive Status: Within Functional Limits for tasks assessed     Extremity/Trunk Assessment Upper Extremity Assessment Upper Extremity Assessment: Overall WFL for tasks assessed Lower Extremity Assessment Lower Extremity Assessment: Overall WFL for tasks assessed Cervical / Trunk Assessment Cervical / Trunk Assessment: Normal   Balance Balance Balance Assessed: Yes Static Standing Balance Rhomberg - Eyes Closed: 20  End of Session PT - End of Session Equipment Utilized During Treatment: Gait belt Activity Tolerance: Patient tolerated treatment well Patient left: in chair;with call bell/phone within reach  GP     Delorse Lek 02/08/2013, 12:34 PM Delaney Meigs, PT [phone]

## 2013-02-08 NOTE — Evaluation (Signed)
Clinical/Bedside Swallow Evaluation Patient Details  Name: Meredith Rodriguez MRN: [id number] Date of Birth: 08-22-1925  Today's Date: 02/08/2013 Time: 7829-5621 SLP Time Calculation (min): 40 min  Past Medical History:  Past Medical History  Diagnosis Date  . Hypertension   . Diabetes mellitus   . Hyperkalemia   . IBS (irritable bowel syndrome)   . Hypercholesterolemia   . Cervical radiculopathy   . Anemia   . Cancer     leukemia  . Leukemia    Past Surgical History:  Past Surgical History  Procedure Laterality Date  . Varicose veins      surgey 1959  . Tonsilectomy, adenoidectomy, bilateral myringotomy and tubes    . Esophagogastroduodenoscopy  04/01/2011    Procedure: ESOPHAGOGASTRODUODENOSCOPY (EGD);  Surgeon: Barrie Folk, MD;  Location: [facility] ENDOSCOPY;  Service: Endoscopy;  Laterality: N/A;   HPI:  [name] is a 79 y.o. female history of a.m. on, diabetes mellitus type 2, anemia had transfusion last week presents to the ER because of increasing weakness and fever.  Patient checked her temperature at home prior to admittance to the hospital and was found to be febrile with temperature of around 102F.  Patient also had some blood-tinged urine. Patient has been experiencing some nonproductive cough over the last one week. In the ER chest x-ray shows features concerning for pneumonia in the right lower lobe and UA shows features of UTI.   Assessment / Plan / Recommendation Clinical Impression  Patient demonstrated possible mild pharyngeal dysphagia.  SLP facilitated session with trials of thin and nectar-thick liquids in addition to trials of graham cracker.  Patient demonstrated intermittent throat clears throughout all consistencies presented; patient reports throat clearing is a "habit" which family confirmed, but it is unclear as to if sensed penetrants/aspirates are present.  SLP cued patient to swallow two and three times per bite/sip; multiple swallow strategy reduced throat  clears.  SLP unable to rule out aspiration at the bedside and discussed option of an objective swallow study given chest x-ray results.  Patient decided to proceed with use of strategies to reduce aspiration risk and observed bedside toleration with SLP. Patient's strong cough and ability to utilize safe swallow strategies with handout reduces overall aspiration risk.  Given chest x-ray results and intermittent throat clears SLP questions whether patient was unable to compensate for suspected pharyngeal residue due to reduced functional reserve or if patient had acute reversible dysphagia as a result of multiple medical issues.  SLP will f/u briefly to ensure tolerance of diet and reinforce use of reviewed swallow precautions to reduce risk of possible aspiration events.    Aspiration Risk  Mild    Diet Recommendation Regular;Thin liquid   Liquid Administration via: Cup Medication Administration: Whole meds with liquid (one medication at a time) Supervision: Intermittent supervision to cue for compensatory strategies Compensations: Small sips/bites;Multiple dry swallows after each bite/sip;Clear throat intermittently Postural Changes and/or Swallow Maneuvers: Seated upright 90 degrees    Other  Recommendations Oral Care Recommendations: Oral care BID   Follow Up Recommendations  None    Frequency and Duration min 2x/week  1 week   Pertinent Vitals/Pain Diminished breath sounds in bilateral lower lobes.  Temperature 99.6 degrees F    SLP Swallow Goals See care plan.   Swallow Study Prior Functional Status  Type of Home: House Available Help at Discharge: Family;Available 24 hours/day    General Date of Onset: 02/07/13 HPI: [name] is a 79 y.o. female history of  a.m. on, diabetes mellitus type 2, anemia had transfusion last week presents to the ER because of increasing weakness and fever.  Patient checked her temperature at home prior to admittance to the hospital and was found to be  febrile with temperature of around 102F.  Patient also had some blood-tinged urine. Patient has been experiencing some nonproductive cough over the last one week. In the ER chest x-ray shows features concerning for pneumonia in the right lower lobe and UA shows features of UTI. Type of Study: Bedside swallow evaluation Diet Prior to this Study: Regular;Thin liquids Temperature Spikes Noted: Yes (102 degrees F at admission; currently 25) Respiratory Status: Room air History of Recent Intubation: No Behavior/Cognition: Alert;Cooperative;Pleasant mood;Hard of hearing (wearing hearing aids) Oral Cavity - Dentition: Adequate natural dentition Self-Feeding Abilities: Able to feed self Patient Positioning: Upright in bed Baseline Vocal Quality: Clear Volitional Cough: Strong Volitional Swallow: Able to elicit    Oral/Motor/Sensory Function Overall Oral Motor/Sensory Function: Appears within functional limits for tasks assessed Labial Symmetry: Within Functional Limits Labial Strength: Within Functional Limits Labial Sensation: Within Functional Limits Facial Symmetry: Within Functional Limits Mandible: Within Functional Limits   Ice Chips Ice chips: Not tested   Thin Liquid Thin Liquid: Impaired Presentation: Cup;Self Fed;Straw Pharyngeal  Phase Impairments: Multiple swallows;Throat Clearing - Immediate;Throat Clearing - Delayed;Cough - Delayed;Other (comments) (cough x1) Other Comments: Pt. reports throat clears are a habit; pt still cleared throat after cued multiple swallows    Nectar Thick Nectar Thick Liquid: Impaired Presentation: Cup;Self Fed Pharyngeal Phase Impairments: Throat Clearing - Immediate;Throat Clearing - Delayed Other Comments: Pt. reports throat clears are a habit; pt still cleared throat after cued multiple swallows   Honey Thick Honey Thick Liquid: Not tested   Puree Puree: Not tested   Solid   GO    Solid: Impaired Presentation: Self Fed Pharyngeal Phase  Impairments: Multiple swallows;Throat Clearing - Immediate;Throat Clearing - Delayed;Cough - Delayed (cough x1) Other Comments: Pt. reports throat clears are a habit; pt still cleared throat after cued multiple swallows       Shadrach Bartunek E 02/08/2013,4:06 PM

## 2013-02-08 NOTE — Progress Notes (Signed)
ANTIBIOTIC CONSULT NOTE - INITIAL  Pharmacy Consult for meropenem Indication: aspiration PNA  Allergies  Allergen Reactions  . Amoxicillin [Amoxicillin]     Upset stomach  . Cephalexin Nausea Only  . Metformin And Related     Gi problems  . Phenergan [Promethazine Hcl] Other (See Comments)    Unknown   . Procardia [Nifedipine]     dizziness    Patient Measurements: Height: 5\' 6"  (167.6 cm) Weight: 110 lb (49.896 kg) IBW/kg (Calculated) : 59.3  Vital Signs: Temp: 99.6 F (37.6 C) (10/13 0457) Temp src: Oral (10/13 0457) BP: 152/71 mmHg (10/13 0457) Pulse Rate: 100 (10/13 0457) Intake/Output from previous day:   Intake/Output from this shift:    Labs:  Recent Labs  02/07/13 2047 02/08/13 0510  WBC 13.3* 12.2*  HGB 8.9* 9.1*  PLT 187 183  CREATININE 0.78 0.60   Estimated Creatinine Clearance: 40.5 ml/min (by C-G formula based on Cr of 0.6). No results found for this basename: Rolm Gala, VANCORANDOM, GENTTROUGH, GENTPEAK, GENTRANDOM, TOBRATROUGH, TOBRAPEAK, TOBRARND, AMIKACINPEAK, AMIKACINTROU, AMIKACIN,  in the last 72 hours   Microbiology: Recent Results (from the past 720 hour(s))  TECHNOLOGIST REVIEW     Status: None   Collection Time    01/18/13  9:15 AM      Result Value Range Status   Technologist Review Large & giant platelets   Final  TECHNOLOGIST REVIEW     Status: None   Collection Time    01/25/13  8:34 AM      Result Value Range Status   Technologist Review few metas present   Final  TECHNOLOGIST REVIEW     Status: None   Collection Time    02/01/13  8:20 AM      Result Value Range Status   Technologist Review Rare blast, occ Metas and Myelocytes present   Final    Medical History: Past Medical History  Diagnosis Date  . Hypertension   . Diabetes mellitus   . Hyperkalemia   . IBS (irritable bowel syndrome)   . Hypercholesterolemia   . Cervical radiculopathy   . Anemia   . Cancer     leukemia  . Leukemia      Medications:  Facility-administered medications prior to admission  Medication Dose Route Frequency Provider Last Rate Last Dose  . TDaP (BOOSTRIX) injection 0.5 mL  0.5 mL Intramuscular Once [name], MD       Prescriptions prior to admission  Medication Sig Dispense Refill  . amLODipine (NORVASC) 10 MG tablet Take 1 tablet (10 mg total) by mouth daily.  90 tablet  3  . aspirin 81 MG tablet Take 81 mg by mouth daily.      . cholecalciferol (VITAMIN D) 1000 UNITS tablet Take 1,000 Units by mouth daily.      Marland Kitchen deferasirox (EXJADE) 500 MG disintegrating tablet Take 1 tablet (500 mg total) by mouth daily before breakfast.  30 tablet  0  . insulin glargine (LANTUS SOLOSTAR) 100 UNIT/ML injection Inject 0.1 mLs (10 Units total) into the skin daily.  5 pen  1  . insulin lispro (HUMALOG KWIKPEN) 100 UNIT/ML SOPN Inject 4 Units into the skin 2 (two) times daily with a meal. Inject 5 units twice daily into the skin with meals. Lunch and dinner      . lidocaine-prilocaine (EMLA) cream Apply topically as needed.  30 g  4  . metoprolol (LOPRESSOR) 50 MG tablet Take 1 tablet (50 mg total) by mouth 2 (two)  times daily.  180 tablet  3  . Omega-3 Fatty Acids (FISH OIL) 1000 MG CAPS Take 1,000 mg by mouth daily.       . simvastatin (ZOCOR) 5 MG tablet Take 5 mg by mouth at bedtime.       Scheduled:  . amLODipine  10 mg Oral Daily  . aspirin  81 mg Oral Daily  . cholecalciferol  1,000 Units Oral Daily  . deferasirox  10 mg/kg Oral QAC breakfast  . insulin aspart  0-9 Units Subcutaneous TID WC  . insulin glargine  10 Units Subcutaneous Daily  . meropenem (MERREM) IV  1 g Intravenous Q12H  . metoprolol  50 mg Oral BID  . omega-3 acid ethyl esters  1,000 mg Oral Daily  . simvastatin  5 mg Oral QHS  . sodium chloride  3 mL Intravenous Q12H     Assessment: 79 yo female here with fever and to begin meropenem for PNA/UTI. Patient noted with history of myelodysplastic syndrome. Patient was  last on chemotherapy 04/2012 and admitted with febrile neutropenia 05/17/12. WBC= 77.3, afeb, SCr= 0.6, CrCl ~40  Abx 10/13 merropenem>>  Cx 10/13 blood x2 10/13 urine  Plan:  -Meropenem 1gm IV q12h -When ready for po antibiotics may be able to consider augmentin as allergy history reflects GI intolerance and not a true amoxicillin allergy. If GI tolerance is a barrier could also consider avelox. -Will follow renal function, cultures and clinical progress  Harland German, Pharm D 02/08/2013 9:57 AM

## 2013-02-08 NOTE — Telephone Encounter (Signed)
[facility] Triage Call Report Triage Record Num: [id number] Operator: Chevis Pretty Patient Name: Meredith Rodriguez Call Date & Time: 02/07/2013 7:35:56PM Patient Phone: [phone] PCP: Evelena Peat Patient Gender: Female PCP Fax : [phone] Patient DOB: December 04, 1925 Practice Name: [facility] MRN: [id number]  Reason for Call: Caller: Tayelor/Patient; PCP: Evelena Peat (Family Practice); CB#: [phone]; Call regarding Urinary bleeding. Onset 02/05/13. Temp 102 O and improved with tylenol 650mg . Per bloody urine protocol, advised ED due to patient's fever and history of thrombocytopenia and chronic leukemia. To go to [facility] ED now. Patient has family who can bring her to [facility] ED.  Protocol(s) Used: Bloody Urine Protocol(s) Used: Urinary Symptoms - Female Recommended Outcome per Protocol: Call Provider Immediately Reason for Outcome: Blood in urine Any temperature elevation in an immunocompromised individual OR frail elderly

## 2013-02-08 NOTE — Progress Notes (Addendum)
[facility] PROGRESS NOTE  Meredith Rodriguez ZOX:[id number] DOB: [birth year] DOA: 02/07/2013 PCP: [name], MD Oncologist Dr Welton Flakes.     HPI/Subjective: Patient complains of nonproductive cough and dry mouth but denies chills, n/v, and chest pain. She reports her urinary burning has resolved.   Assessment/Plan:  Pneumonia ?Aspiration - HCAP (still getting Chemo last 1 week ago)  CXR- Right lower lobe pneumonia or atelectasis. Started IV Levaquin, patient remains febrile this morning, broaden antibiotics with Vanco & meropenem, blood cultures pending Speech swallow evaluation pending     Possible Urinary Tract Infection Weakly diagnostic UA results, patient was symptomatic, started on IV Levaquin, urine cultures pending    Hyponatremia with weakness Dehydration, normal saline 75 ml/hr, urine osmolality decreased with normal urine sodium PT eval pending    Anemia  Likely due to AML, RBC, Hgb, and Hct decreased on CBC, trending upward since admission, will monitor CBC daily, of note- patient received blood transfusion last week     Diabetes Mellitus, Type II Continue home dose Lantus with SSI- Sensitive, carb modified diet   Lab Results  Component Value Date   HGBA1C 8.8* 01/29/2013    CBG (last 3)   Recent Labs  02/08/13 0027 02/08/13 0826  GLUCAP 272* 172*       Acute Myeloid Leukemia  Stable. Managed by Augustin Schooling, NP, (Dr Welton Flakes) received several rounds of chemotherapy       Code Status: FULL Family Communication: Husband in room Disposition Plan: Inpatient     Consultants:  PT/OT evaluation pending    Procedures:  None    Antibiotics:  Levaquin: Started 02/07/13  Meropenem: Started 02/08/13    DVT Prophylaxis:  SCDs    Objective: Filed Vitals:   02/08/13 0457  BP: 152/71  Pulse: 100  Temp: 99.6 F (37.6 C)  Resp: 17   No intake or output data in the 24 hours ending 02/08/13 0929 Filed Weights   02/07/13 2021 02/07/13 2357  Weight: 51.211 kg (112 lb 14.4 oz) 49.896 kg (110 lb)    Exam:   General:  Well-developed, well-nourished, pleasant Caucasian female, in no acute distress  Cardiovascular: RRR, no rubs, gallops, or murmurs, distal pulses intact  Respiratory: dullness to auscultation on the right side posteriorly, decreased breath sounds on the right, no rhonchi or wheezing  Abdomen: soft, non-distended, bowel sounds present, non-tender  Musculoskeletal: no edema, normal ROM  Data Reviewed: Basic Metabolic Panel:  Recent Labs Lab 02/07/13 2047 02/08/13 0510  NA 124* 131*  K 3.9 4.1  CL 89* 96  CO2 24 28  GLUCOSE 271* 165*  BUN 19 13  CREATININE 0.78 0.60  CALCIUM 8.8 9.0   Liver Function Tests:  Recent Labs Lab 02/07/13 2047  AST 33  ALT 34  ALKPHOS 55  BILITOT 0.9  PROT 6.5  ALBUMIN 3.2*   CBC:  Recent Labs Lab 02/07/13 2047 02/08/13 0510  WBC 13.3* 12.2*  NEUTROABS 12.3*  --   HGB 8.9* 9.1*  HCT 25.8* 27.2*  MCV 85.1 86.9  PLT 187 183   CBG:  Recent Labs Lab 02/08/13 0027 02/08/13 0826  GLUCAP 272* 172*    Recent Results (from the past 240 hour(s))  TECHNOLOGIST REVIEW     Status: None   Collection Time    02/01/13  8:20 AM      Result Value Range Status   Technologist Review Rare blast, occ Metas and Myelocytes present   Final     Studies: Dg Chest 2 View  02/07/2013   CLINICAL DATA:  Weakness.  EXAM: CHEST  2 VIEW  COMPARISON:  November 26, 2012.  FINDINGS: Cardiomediastinal silhouette appears normal. Left lung is clear. Right internal jugular Port-A-Cath is unchanged in position. There is interval development of right basilar opacity consistent with pneumonia or atelectasis. No pneumothorax is noted.  IMPRESSION: Right lower lobe pneumonia or atelectasis. Followup radiographs are recommended.   Electronically Signed   By: Roque Lias M.D.   On: 02/07/2013 21:24    Scheduled Meds: . amLODipine  10 mg Oral Daily  . aspirin   81 mg Oral Daily  . cholecalciferol  1,000 Units Oral Daily  . deferasirox  10 mg/kg Oral QAC breakfast  . insulin aspart  0-9 Units Subcutaneous TID WC  . insulin glargine  10 Units Subcutaneous Daily  . meropenem (MERREM) IV  1 g Intravenous Q12H  . metoprolol  50 mg Oral BID  . omega-3 acid ethyl esters  1,000 mg Oral Daily  . simvastatin  5 mg Oral QHS  . sodium chloride  3 mL Intravenous Q12H   Continuous Infusions: . sodium chloride 75 mL/hr at 02/08/13 0032    Principal Problem:   Community acquired pneumonia Active Problems:   Anemia   UTI (lower urinary tract infection)   Hyponatremia   Meredith Rodriguez, SARA A PA-S  Algis Downs, PA-C [phone] [facility] If 7PM-7AM, please contact night-coverage at [email or web address], password [facility] 02/08/2013, 9:29 AM  LOS: 1 day       I have directly reviewed the clinical findings, lab, imaging studies and management of this patient in detail. I have interviewed and examined the patient and agree with the documentation,  as recorded by the Physician extender.  Leroy Sea M.D on 02/08/2013 at 10:40 AM  [facility] Office  [phone]

## 2013-02-08 NOTE — Progress Notes (Signed)
Meredith Rodriguez [id number] Admitted to 1Y78: 02/08/2013 1:12 AM Attending Provider: Eduard Clos, MD    Meredith Rodriguez is a 79 y.o. female patient admitted from ED awake, alert  & orientated  X 3,  Full Code, VSS - Blood pressure 159/57, pulse 91, temperature 97.7 F (36.5 C), temperature source Oral, resp. rate 17, height 5\' 6"  (1.676 m), weight 49.896 kg (110 lb), SpO2 98.00%., room air, no c/o shortness of breath, no c/o chest pain, no distress noted. Tele # 21 placed and pt is currently running:normal sinus rhythm.   IV site WDL:  wrist right, condition patent and no redness with a transparent dsg that's clean dry and intact.  Allergies:   Allergies  Allergen Reactions  . Amoxicillin [Amoxicillin]     Upset stomach  . Cephalexin Nausea Only  . Metformin And Related     Gi problems  . Phenergan [Promethazine Hcl] Other (See Comments)    Unknown   . Procardia [Nifedipine]     dizziness     Past Medical History  Diagnosis Date  . Hypertension   . Diabetes mellitus   . Hyperkalemia   . IBS (irritable bowel syndrome)   . Hypercholesterolemia   . Cervical radiculopathy   . Anemia   . Cancer     leukemia  . Leukemia     History:  obtained from the patient.  Pt orientation to unit, room and routine. Information packet given to patient/family and safety video watched.  Admission INP armband ID verified with patient/family, and in place. SR up x 2, fall risk assessment complete with Patient and family verbalizing understanding of risks associated with falls. Pt verbalizes an understanding of how to use the call bell and to call for help before getting out of bed.  Skin, clean-dry- intact without evidence of bruising, or skin tears.   No evidence of skin break down noted on exam.    Will cont to monitor and assist as needed.  Meredith Eke, RN 02/08/2013 1:12 AM

## 2013-02-08 NOTE — Progress Notes (Signed)
Utilization review completed.  

## 2013-02-08 NOTE — Evaluation (Signed)
The evaluation and plan has been reviewed and SLP is in agreement. Fae Pippin, M.A., CCC-SLP [phone]

## 2013-02-09 ENCOUNTER — Inpatient Hospital Stay (HOSPITAL_COMMUNITY): Payer: Medicare Other

## 2013-02-09 DIAGNOSIS — D849 Immunodeficiency, unspecified: Secondary | ICD-10-CM

## 2013-02-09 DIAGNOSIS — D469 Myelodysplastic syndrome, unspecified: Secondary | ICD-10-CM | POA: Diagnosis present

## 2013-02-09 DIAGNOSIS — J189 Pneumonia, unspecified organism: Secondary | ICD-10-CM | POA: Diagnosis present

## 2013-02-09 DIAGNOSIS — Z880 Allergy status to penicillin: Secondary | ICD-10-CM

## 2013-02-09 DIAGNOSIS — C9200 Acute myeloblastic leukemia, not having achieved remission: Secondary | ICD-10-CM

## 2013-02-09 LAB — BASIC METABOLIC PANEL
CO2: 24 mEq/L (ref 19–32)
Calcium: 8.5 mg/dL (ref 8.4–10.5)
Creatinine, Ser: 0.62 mg/dL (ref 0.50–1.10)
GFR calc Af Amer: >90 mL/min (ref >90)
Glucose, Bld: 174 mg/dL — ABNORMAL HIGH (ref 70–99)

## 2013-02-09 LAB — URINE CULTURE
Colony Count: NO GROWTH
Culture: NO GROWTH

## 2013-02-09 LAB — CBC
HCT: 24.7 % — ABNORMAL LOW (ref 36.0–46.0)
Hemoglobin: 8.4 g/dL — ABNORMAL LOW (ref 12.0–15.0)
MCHC: 34 g/dL (ref 30.0–36.0)
Platelets: 207 10*3/uL (ref 150–400)
RBC: 2.9 MIL/uL — ABNORMAL LOW (ref 3.87–5.11)
RDW: 15.3 % (ref 11.5–15.5)
WBC: 12.7 10*3/uL — ABNORMAL HIGH (ref 4.0–10.5)

## 2013-02-09 LAB — GLUCOSE, CAPILLARY
Glucose-Capillary: 229 mg/dL — ABNORMAL HIGH (ref 70–99)
Glucose-Capillary: 252 mg/dL — ABNORMAL HIGH (ref 70–99)
Glucose-Capillary: 220 mg/dL — ABNORMAL HIGH (ref 70–99)

## 2013-02-09 MED ORDER — INSULIN ASPART 100 UNIT/ML ~~LOC~~ SOLN
0.0000 [IU] | Freq: Three times a day (TID) | SUBCUTANEOUS | Status: DC
  Administered 2013-02-09: 18:00:00 8 [IU] via SUBCUTANEOUS
  Administered 2013-02-09: 5 [IU] via SUBCUTANEOUS
  Administered 2013-02-10: 09:00:00 3 [IU] via SUBCUTANEOUS
  Administered 2013-02-10: 8 [IU] via SUBCUTANEOUS
  Administered 2013-02-11: 2 [IU] via SUBCUTANEOUS
  Administered 2013-02-11: 18:00:00 3 [IU] via SUBCUTANEOUS
  Administered 2013-02-11: 13:00:00 5 [IU] via SUBCUTANEOUS
  Administered 2013-02-12: 13:00:00 3 [IU] via SUBCUTANEOUS

## 2013-02-09 MED ORDER — LEVALBUTEROL HCL 0.63 MG/3ML IN NEBU
0.6300 mg | INHALATION_SOLUTION | Freq: Four times a day (QID) | RESPIRATORY_TRACT | Status: DC | PRN
Start: 2013-02-09 — End: 2013-02-12

## 2013-02-09 MED ORDER — LEVALBUTEROL HCL 0.63 MG/3ML IN NEBU
0.6300 mg | INHALATION_SOLUTION | Freq: Three times a day (TID) | RESPIRATORY_TRACT | Status: DC
  Administered 2013-02-09 (×2): 0.63 mg via RESPIRATORY_TRACT
  Filled 2013-02-09 (×2): qty 3

## 2013-02-09 MED ORDER — GLUCERNA SHAKE PO LIQD
237.0000 mL | ORAL | Status: DC
  Administered 2013-02-10 – 2013-02-12 (×3): 237 mL via ORAL
  Filled 2013-02-09 (×2): qty 237

## 2013-02-09 MED ORDER — INSULIN GLARGINE 100 UNIT/ML ~~LOC~~ SOLN
12.0000 [IU] | Freq: Every day | SUBCUTANEOUS | Status: DC
  Filled 2013-02-09: qty 0.12

## 2013-02-09 NOTE — Consult Note (Signed)
PULMONARY  / CRITICAL CARE MEDICINE  Name: Meredith Rodriguez MRN: [id number] DOB: [birth year]    ADMISSION DATE:  02/07/2013 CONSULTATION DATE:  02/09/2013  REFERRING MD :  Thedore Mins PRIMARY SERVICE:  Internal  CHIEF COMPLAINT:  SOB/Dyspnea  BRIEF PATIENT DESCRIPTION: 79 y.o. F admitted 02/07/13 with fever and weakness. CXR concerning for PNA and UA suggestive of UTI.  CXR worsening noted 10/14 & PCCM consulted for further evaluation.  SIGNIFICANT EVENTS / STUDIES:  10/12 - admitted for PNA and UTI 10/14 - increased SOB requiring O2, CXR worse with small right effusion. New left base opacity.  LINES / TUBES: Port A Cath 10/13 >>>   CULTURES: Blood x 2 10/13 >>> Urine 10/12 >>> neg Sputum 10/14 >>> U. Strep 10/14>>> U. Legionella 10/14>>>  ANTIBIOTICS: Levaquin 10/12 >>> 10/13 Meropenem 10/13 >>> Vancomycin 10/13 >>>   HISTORY OF PRESENT ILLNESS:  Mrs. Meredith Rodriguez is an 79 y.o. F, never-smoker, with PMH of AML, DM2, and anemia who presented to [facility] ED on 10/12 with increasing weakness.  She reports that prior to coming to the ED, she took her temp at home and it was 102F.  Pt states she had also been having some urinary burning at the time along with blood tinged urine.  For a week prior to coming to the ER, she had been experiencing a nonproductive cough.  Workup in ED revealed concern for PNA on CXR and UTI.  Pt was started on Levaquin and Vancomycin.  Since admission, her symptoms have worsened and she is now requiring supplemental O2 to maintain SpO2 > 90.  Pt states that over the past week or two she has been moving a lot of bookshelves and other items in her home because they are getting their 77 year old carpet replaced.  While moving items, she noted "a good bit of mold dew" on the walls.  She is now concerned whether she inhaled some mold/dust.  Given this history, Internal Medicine therefore decided to consult pulmonology for further management.  Pt reports that "I'm not feeling well" am of  consult.  She states that her breathing is improved since she has been on 2L O2 via [place].  She states that without the O2, it is difficult for her to catch her breath.  She continues to have a dry cough and mild fevers.  She denies chills, sweats, myalgias, chest pain, N/V/D.  No known recent exposure to sick contacts and no recent travel.  PAST MEDICAL HISTORY :  Past Medical History  Diagnosis Date  . Hypertension   . Diabetes mellitus   . Hyperkalemia   . IBS (irritable bowel syndrome)   . Hypercholesterolemia   . Cervical radiculopathy   . Anemia   . Cancer     leukemia  . Leukemia    Past Surgical History  Procedure Laterality Date  . Varicose veins      surgey 1959  . Tonsilectomy, adenoidectomy, bilateral myringotomy and tubes    . Esophagogastroduodenoscopy  04/01/2011    Procedure: ESOPHAGOGASTRODUODENOSCOPY (EGD);  Surgeon: Barrie Folk, MD;  Location: [facility] ENDOSCOPY;  Service: Endoscopy;  Laterality: N/A;   Prior to Admission medications   Medication Sig Start Date End Date Taking? Authorizing Provider  amLODipine (NORVASC) 10 MG tablet Take 1 tablet (10 mg total) by mouth daily. 11/24/12  Yes Nicki Reaper, NP  aspirin 81 MG tablet Take 81 mg by mouth daily.   Yes Historical Provider, MD  cholecalciferol (VITAMIN D) 1000 UNITS tablet Take 1,000  Units by mouth daily.   Yes Historical Provider, MD  deferasirox (EXJADE) 500 MG disintegrating tablet Take 1 tablet (500 mg total) by mouth daily before breakfast. 01/25/13  Yes Illa Level, NP  insulin glargine (LANTUS SOLOSTAR) 100 UNIT/ML injection Inject 0.1 mLs (10 Units total) into the skin daily. 08/07/12  Yes [name], MD  insulin lispro (HUMALOG KWIKPEN) 100 UNIT/ML SOPN Inject 4 Units into the skin 2 (two) times daily with a meal. Inject 5 units twice daily into the skin with meals. Lunch and dinner   Yes Historical Provider, MD  lidocaine-prilocaine (EMLA) cream Apply topically as needed. 02/19/12  Yes Victorino December, MD  metoprolol (LOPRESSOR) 50 MG tablet Take 1 tablet (50 mg total) by mouth 2 (two) times daily. 05/18/12  Yes Cassell Clement, MD  Omega-3 Fatty Acids (FISH OIL) 1000 MG CAPS Take 1,000 mg by mouth daily.    Yes Historical Provider, MD  simvastatin (ZOCOR) 5 MG tablet Take 5 mg by mouth at bedtime.   Yes Historical Provider, MD   Allergies  Allergen Reactions  . Amoxicillin [Amoxicillin]     Upset stomach  . Cephalexin Nausea Only  . Metformin And Related     Gi problems  . Phenergan [Promethazine Hcl] Other (See Comments)    Unknown   . Procardia [Nifedipine]     dizziness    FAMILY HISTORY:  Family History  Problem Relation Age of Onset  . Stroke Mother   . Hypertension Father   . Cancer Sister   . Cancer Sister   . Diabetes Sister   . Cancer Sister     colon cancer  . Cancer Daughter     breast cancer   SOCIAL HISTORY:  reports that she has never smoked. She does not have any smokeless tobacco history on file. She reports that she does not drink alcohol or use illicit drugs.  REVIEW OF SYSTEMS:  See HPI for pertinent positives.  Constitutional: Negative for chills, weight loss, and diaphoresis.  HENT: Negative for hearing loss, ear pain, nosebleeds, congestion, sore throat, neck pain, tinnitus and ear discharge.   Eyes: Negative for blurred vision, double vision, photophobia, pain, discharge and redness.  Respiratory: Negative for hemoptysis, sputum production, wheezing and stridor.   Cardiovascular: Negative for chest pain, palpitations, orthopnea, claudication, leg swelling and PND.  Gastrointestinal: Negative for heartburn, nausea, vomiting, abdominal pain, diarrhea, constipation, blood in stool and melena.  Genitourinary: Negative for dysuria, urgency, frequency, and flank pain.  Musculoskeletal: Negative for myalgias, back pain, joint pain and falls.  Skin: Negative for itching and rash.  Neurological: Negative for dizziness, tingling, tremors, sensory  change, speech change, focal weakness, seizures, loss of consciousness, weakness and headaches.  Endo/Heme/Allergies: Negative for environmental allergies and polydipsia. Does not bruise/bleed easily.  SUBJECTIVE:  "I'm doing better since I've had the oxygen".  VITAL SIGNS: Temp:  [97.8 F (36.6 C)-100.2 F (37.9 C)] 100.1 F (37.8 C) (10/14 0700) Pulse Rate:  [81-109] 82 (10/14 0700) Resp:  [18] 18 (10/14 0512) BP: (132-154)/(62-67) 154/67 mmHg (10/14 0700) SpO2:  [83 %-98 %] 93 % (10/14 0908)  PHYSICAL EXAMINATION: General:  Elderly female sitting up in recliner chair.  In NAD. Neuro:  A&O x 3.  Mood and affect appropriate, follows commands. HEENT:  [place]/AT. PERRL.  MMM. Neck:  No LAD or JVD. Cardiovascular:  RRR, no M/R/G. Lungs:  Slightly diminished at right base, otherwise CTA.  No W/R/R. Abdomen:  BS x 4.  Soft, NT/ND.  Musculoskeletal:  No gross deformities.  Moves all 4 extremities actively.  No edema. Skin:  Warm and dry.  No rashes.   Recent Labs Lab 02/07/13 2047 02/08/13 0510 02/09/13 0345  NA 124* 131* 134*  K 3.9 4.1 3.8  CL 89* 96 100  CO2 24 28 24   BUN 19 13 16   CREATININE 0.78 0.60 0.62  GLUCOSE 271* 165* 174*    Recent Labs Lab 02/07/13 2047 02/08/13 0510 02/09/13 0345  HGB 8.9* 9.1* 8.4*  HCT 25.8* 27.2* 24.7*  WBC 13.3* 12.2* 12.7*  PLT 187 183 207   Dg Chest 2 View  02/07/2013   CLINICAL DATA:  Weakness.  EXAM: CHEST  2 VIEW  COMPARISON:  November 26, 2012.  FINDINGS: Cardiomediastinal silhouette appears normal. Left lung is clear. Right internal jugular Port-A-Cath is unchanged in position. There is interval development of right basilar opacity consistent with pneumonia or atelectasis. No pneumothorax is noted.  IMPRESSION: Right lower lobe pneumonia or atelectasis. Followup radiographs are recommended.   Electronically Signed   By: Roque Lias M.D.   On: 02/07/2013 21:24   Dg Chest Port 1 View  02/09/2013   CLINICAL DATA:  Pneumonia, followup   EXAM: PORTABLE CHEST - 1 VIEW  COMPARISON:  Chest x-ray of 02/07/2013  FINDINGS: There has been worsening of opacity at the right lung base consistent with pneumonia and possible small right effusion. There is also now a patchy opacity medially at the left lung base overlying the heart shadow suspicious for a focus of pneumonia as well. No left pleural effusion is seen. A right-sided Port-A-Cath is unchanged in position and cardiomegaly is stable.  IMPRESSION: Worsening of right basilar opacity consistent with pneumonia and effusion. New opacity at the left base also consistent with multifocal pneumonia.   Electronically Signed   By: Dwyane Dee M.D.   On: 02/09/2013 07:55    ASSESSMENT / PLAN:  SOB/Hypoxia - in the setting of progressive HCAP demonstrated by worsened CXR 10/14.   HCAP R Pleural Effusion   P: - Initially on Levaquin; however, coverage has now been broadened to Vanc & Meropenem.  Continue current abx. - Monitor fever curve/WBC - O2 to maintain sats > 90%. - f/u sputum cultures. - f/u U. Strep antigen & legionella - reassess PA / Lateral now - Given pt's history and immunocompromised state (leukemia s/p chemo), consider adding antifungal if clinically worsens - no role for steroids. - concern with decreased BS in R that there may be component of airway obstruction / mucus plugging  All other problems managed by primary team.   Rutherford Guys, PA - Dara Lords, NP-C [facility] Pgr: [phone] or [phone] 02/09/2013, 9:49 AM   I have interviewed and examined the patient and reviewed the database. I have formulated the assessment and plan as reflected in the note above with amendments made by me.   The CXR progression might simply represent the usual "CXR lag". Need to exclude effusion. Consider possibility of RLL bronchial obstruction  Billy Fischer, MD;  PCCM service; Mobile [phone]

## 2013-02-09 NOTE — Progress Notes (Addendum)
Inpatient Diabetes Program Recommendations  AACE/ADA: New Consensus Statement on Inpatient Glycemic Control (2013)  Target Ranges:  Prepandial:   less than 140 mg/dL      Peak postprandial:   less than 180 mg/dL (1-2 hours)      Critically ill patients:  140 - 180 mg/dL     Results for Meredith Rodriguez, Meredith Rodriguez (MRN [id number]) as of 02/09/2013 08:25  Ref. Range 02/08/2013 08:26 02/08/2013 11:33 02/08/2013 16:30 02/08/2013 21:49  Glucose-Capillary Latest Range: 70-99 mg/dL 782 (H) 956 (H) 213 (H) 185 (H)    Results for Meredith Rodriguez, Meredith Rodriguez (MRN [id number]) as of 02/09/2013 08:25  Ref. Range 02/09/2013 07:47  Glucose-Capillary Latest Range: 70-99 mg/dL 629 (H)    **Fasting glucose still slightly elevated- Postprandial glucose levels also elevated  **Per records, patient takes Humalog at home (4-5 units bid with meals)   **MD- Please consider the following in-hospital insulin adjustments:  1. Increase Lantus to 12 units daily in the AM (current order is Lantus 10 units daily in the AM) 2. Add Novolog meal coverage- Novolog 3 units tid with meals     Will follow. Ambrose Finland RN, MSN, CDE Diabetes Coordinator Inpatient Diabetes Program Team Pager: [phone] (8a-10p)

## 2013-02-09 NOTE — Progress Notes (Signed)
Speech Language Pathology Treatment: Dysphagia  Patient Details Name: Meredith Rodriguez MRN: [id number] DOB: Sep 15, 1925 Today's Date: 02/09/2013 Time: 1130-1150 SLP Time Calculation (min): 20 min  Assessment / Plan / Recommendation Clinical Impression  Pt. Seen for skilled dysphagia therapy.  CXR this morning revealed worsening of right basilar opacity consistent with pneumonia and effusion. New opacity at the left base also consistent with multifocal pneumonia.  Pt. With intermittent throat clear during initial swallow assessment yesterday with Efraim Kaufmann, SLP.  Today she consumed thin water (cup and straw) and cracker consistently without any cough or throat clearing.  She did not appear SOB after po's.  Pt. Denies coughing at home with po's or burning in chest or globus sensation.  One CXR, out of multiple in the past 2 years, revealed possible pna or mass and recommended chest CT.  She reports difficulty swallowing meat occasionally.  Pt. Had EGD in 2012, however results could not be located and pt. Does not recall.  Suspect she may have esophageal component without overt signs.  Differential diagnosis includes possible aspiration of esophageal/gastric contents if present or silent aspiration during the swallow.  Later seems more unlikely given no history of neurological or cervical impairments.  Silent aspiration cannot be excluded from bedside observation alone and an MBS can be performed if desired by MD.  SLP will continue to see pt. For safety with recommendations and need for objective testing if warranted.         Pertinent Vitals none  SLP Plan  Continue with current plan of care    Recommendations Diet recommendations: Regular;Thin liquid Liquids provided via: Cup;Straw Medication Administration: Whole meds with liquid Supervision: Patient able to self feed;Intermittent supervision to cue for compensatory strategies Compensations: Slow rate;Small sips/bites Postural Changes and/or Swallow  Maneuvers: Seated upright 90 degrees              Oral Care Recommendations: Oral care BID Follow up Recommendations: None Plan: Continue with current plan of care    GO     Royce Macadamia M.Ed [employer] [phone]  02/09/2013

## 2013-02-09 NOTE — Care Management Note (Deleted)
   CARE MANAGEMENT NOTE 02/09/2013  Patient:  Meredith Rodriguez, Meredith Rodriguez   Account Number:  [id number]  Date Initiated:  02/08/2013  Documentation initiated by:  Darlyne Russian  Subjective/Objective Assessment:   Patient admitted with RLL pneumonia and UTI.     Action/Plan:   Progression of care and discharge planning  02/09/13 new dx of leukemia, now hypoxic, await pul and palliative care consults.   Anticipated DC Date:  02/11/2013   Anticipated DC Plan:  SKILLED NURSING FACILITY         Choice offered to / List presented to:             Status of service:  In process, will continue to follow Medicare Important Message given?   (If response is "NO", the following Medicare IM given date fields will be blank) Date Medicare IM given:   Date Additional Medicare IM given:    Discharge Disposition:    Per UR Regulation:  Reviewed for med. necessity/level of care/duration of stay  If discussed at Long Length of Stay Meetings, dates discussed:    Comments:

## 2013-02-09 NOTE — Progress Notes (Signed)
INITIAL NUTRITION ASSESSMENT  DOCUMENTATION CODES Per approved criteria  -Non-severe (moderate) malnutrition in the context of chronic illness -Underweight   INTERVENTION: Add Glucerna Shake po daily, each supplement provides 220 kcal and 10 grams of protein. Encouraged continued intake of these supplements when d/c to help with weight maintenance. RD to continue to follow nutrition care plan.  NUTRITION DIAGNOSIS: Increased nutrient needs related to catabolic illness as evidenced by estimated needs.   Goal: Intake to meet >90% of estimated nutrition needs.  Monitor:  weight trends, lab trends, I/O's, PO intake, supplement tolerance  Reason for Assessment: Malnutrition Screening Tool  79 y.o. female  Admitting Dx: Community acquired pneumonia  ASSESSMENT: PMHx significant for DM2, leukemia (was getting chemo last week) and anemia. Admitted with fever, bloody urine, productive cough. Work-up reveals PNA and UTI.  Seen by SLP yesterday with recommendations for Regular diet with thin liquids.  Discussed during progression rounds. Pt noted that she has likely been breathing in a lot of mold recently as she has gotten her 77 year old carpet replaced.  We discussed her weight status - pt is underweight. She states that she has been told to drink Ensure or Glucerna in the past, however she has limited her intake 2/2 her blood sugars. We discussed the importance of weight maintenance in the hospital, and that having her lose more weight while she is here would put her at risk. She is agreeable to drinking supplements.  Pt meets criteria for moderate MALNUTRITION in the context of chronic illness as evidenced by  moderate subcutaneous fat loss and muscle wasting.  Nutrition Focused Physical Exam:  Subcutaneous Fat:  Orbital Region: WNL Upper Arm Region: moderate wasting Thoracic and Lumbar Region: moderate wasting  Muscle:  Temple Region: moderate wasting Clavicle Bone Region:  moderate wasting Clavicle and Acromion Bone Region: moderate wasting Scapular Bone Region: n/a Dorsal Hand: n/a Patellar Region: n/a Anterior Thigh Region: n/a Posterior Calf Region: n/a  Edema: n/a    Height: Ht Readings from Last 1 Encounters:  02/07/13 5\' 6"  (1.676 m)    Weight: Wt Readings from Last 1 Encounters:  02/07/13 110 lb (49.896 kg)    Ideal Body Weight: 130 lb  % Ideal Body Weight: 85%  Wt Readings from Last 25 Encounters:  02/07/13 110 lb (49.896 kg)  01/29/13 112 lb (50.803 kg)  01/25/13 109 lb 3.2 oz (49.533 kg)  12/21/12 107 lb 1.6 oz (48.58 kg)  11/27/12 105 lb (47.628 kg)  11/24/12 104 lb (47.174 kg)  11/23/12 106 lb 4.8 oz (48.217 kg)  10/29/12 107 lb (48.535 kg)  10/26/12 106 lb 9.6 oz (48.353 kg)  09/22/12 104 lb 9.6 oz (47.446 kg)  08/24/12 102 lb 11.2 oz (46.584 kg)  07/30/12 104 lb (47.174 kg)  07/27/12 102 lb 14.4 oz (46.675 kg)  06/29/12 101 lb 11.2 oz (46.131 kg)  06/01/12 101 lb 4.8 oz (45.949 kg)  05/18/12 99 lb 1.6 oz (44.951 kg)  05/11/12 100 lb 9.6 oz (45.632 kg)  05/04/12 102 lb 8 oz (46.494 kg)  04/27/12 102 lb 1.6 oz (46.312 kg)  04/20/12 99 lb 12.8 oz (45.269 kg)  04/13/12 97 lb 4.8 oz (44.135 kg)  04/06/12 96 lb 4.8 oz (43.681 kg)  03/30/12 99 lb 8 oz (45.133 kg)  03/23/12 103 lb 8 oz (46.947 kg)  03/16/12 101 lb 12.8 oz (46.176 kg)   Usual Body Weight: 100 - 105 lb  % Usual Body Weight: 107%  BMI:  Body mass index is 17.76  kg/(m^2). Underweight.  Estimated Nutritional Needs: Kcal: 1500 - 1700 Protein: 65 - 75 g Fluid: 1.5 - 1.7 liters daily  Skin: intact  Diet Order: Carb Control Medium (1600 - 2000)  EDUCATION NEEDS: -Education needs addressed   Intake/Output Summary (Last 24 hours) at 02/09/13 0846 Last data filed at 02/08/13 1800  Gross per 24 hour  Intake   2040 ml  Output    300 ml  Net   1740 ml    Last BM: 10/13  Labs:   Recent Labs Lab 02/07/13 2047 02/08/13 0510 02/09/13 0345  NA  124* 131* 134*  K 3.9 4.1 3.8  CL 89* 96 100  CO2 24 28 24   BUN 19 13 16   CREATININE 0.78 0.60 0.62  CALCIUM 8.8 9.0 8.5  GLUCOSE 271* 165* 174*    CBG (last 3)   Recent Labs  02/08/13 1630 02/08/13 2149 02/09/13 0747  GLUCAP 226* 185* 164*    Scheduled Meds: . amLODipine  10 mg Oral Daily  . aspirin  81 mg Oral Daily  . cholecalciferol  1,000 Units Oral Daily  . deferasirox  10 mg/kg Oral QAC breakfast  . insulin aspart  0-9 Units Subcutaneous TID WC  . insulin glargine  10 Units Subcutaneous Daily  . levalbuterol  0.63 mg Nebulization Q8H  . meropenem (MERREM) IV  1 g Intravenous Q12H  . metoprolol  50 mg Oral BID  . omega-3 acid ethyl esters  1,000 mg Oral Daily  . simvastatin  5 mg Oral QHS  . sodium chloride  3 mL Intravenous Q12H  . vancomycin  750 mg Intravenous Q24H    Continuous Infusions:   Past Medical History  Diagnosis Date  . Hypertension   . Diabetes mellitus   . Hyperkalemia   . IBS (irritable bowel syndrome)   . Hypercholesterolemia   . Cervical radiculopathy   . Anemia   . Cancer     leukemia  . Leukemia     Past Surgical History  Procedure Laterality Date  . Varicose veins      surgey 1959  . Tonsilectomy, adenoidectomy, bilateral myringotomy and tubes    . Esophagogastroduodenoscopy  04/01/2011    Procedure: ESOPHAGOGASTRODUODENOSCOPY (EGD);  Surgeon: Barrie Folk, MD;  Location: [facility] ENDOSCOPY;  Service: Endoscopy;  Laterality: N/A;    Jarold Motto MS, RD, LDN Pager: [phone] After-hours pager: [phone]

## 2013-02-09 NOTE — Progress Notes (Signed)
[facility] PROGRESS NOTE  Meredith Rodriguez ZOX:[id number] DOB: [birth year] DOA: 02/07/2013 PCP: [name], MD Oncologist Dr Welton Flakes.   HPI/Subjective Meredith Rodriguez mentions she recently had 77 year old carpet replaced in her house.  She felt she was breathing a lot of dust and mold.  Patient reports she is not feeling well this morning.  Having more difficulty breathing.  Assessment/Plan:  HCA Pneumonia  CXR- Right lower lobe pneumonia or atelectasis. Started on Levaquin, changed to Vanco & meropenem on 10/13.    Remains febrile this morning Pulmonary Consultation requested 10/14 Speech swallow evaluation showed possible mild pharyngeal dysphagia.   Hypoxia (10/14) Secondary to HCAP Oxygen sats at 87% lying in bed.   Started on Oxygen nasal canula.   Possible Urinary Tract Infection Weakly diagnostic UA results, patient was symptomatic, started on IV Levaquin, urine cultures pending   Hyponatremia with weakness Dehydration and hyponatremia resolved with IVF Urine osmolality decreased with normal urine sodium   Diabetes Mellitus, Type II Increased Lantus to 12 units with SSI- moderate (increased 10/14) carb modified diet   Lab Results  Component Value Date   HGBA1C 8.8* 01/29/2013    CBG (last 3)   Recent Labs  02/08/13 1630 02/08/13 2149 02/09/13 0747  GLUCAP 226* 185* 164*     Acute Myeloid Leukemia  Stable. Managed by Augustin Schooling, NP, (Dr Welton Flakes) Last Chemo was 6 months ago.  Prognosis is reported as poor She receives palliative transfusions Continue Ex-Jade iron chelation therapy.  Patient will bring medicine from home.   Code Status: FULL (will discuss further with patient.) Family Communication: Husband in room Disposition Plan: Inpatient     Consultants:  Pulmonary Consultation   Procedures:  None    Antibiotics:  Levaquin: Started 02/07/13  Meropenem: Started 02/08/13    DVT Prophylaxis:  SCDs    Objective: Filed  Vitals:   02/09/13 1017  BP: 136/56  Pulse:   Temp:   Resp:     Intake/Output Summary (Last 24 hours) at 02/09/13 1017 Last data filed at 02/08/13 1800  Gross per 24 hour  Intake   1800 ml  Output      0 ml  Net   1800 ml   Filed Weights   02/07/13 2021 02/07/13 2357  Weight: 51.211 kg (112 lb 14.4 oz) 49.896 kg (110 lb)    Exam:   General:  Elderly, sharp, pleasant Caucasian female, in no acute distress  Cardiovascular: RRR, no rubs, gallops, or murmurs, distal pulses intact  Respiratory: mild accessory muscle use, coarse breath sounds on lower right.  Abdomen: soft, non-distended, bowel sounds present, non-tender  Musculoskeletal: no edema, normal ROM  Data Reviewed: Basic Metabolic Panel:  Recent Labs Lab 02/07/13 2047 02/08/13 0510 02/09/13 0345  NA 124* 131* 134*  K 3.9 4.1 3.8  CL 89* 96 100  CO2 24 28 24   GLUCOSE 271* 165* 174*  BUN 19 13 16   CREATININE 0.78 0.60 0.62  CALCIUM 8.8 9.0 8.5   Liver Function Tests:  Recent Labs Lab 02/07/13 2047  AST 33  ALT 34  ALKPHOS 55  BILITOT 0.9  PROT 6.5  ALBUMIN 3.2*   CBC:  Recent Labs Lab 02/07/13 2047 02/08/13 0510 02/09/13 0345  WBC 13.3* 12.2* 12.7*  NEUTROABS 12.3*  --   --   HGB 8.9* 9.1* 8.4*  HCT 25.8* 27.2* 24.7*  MCV 85.1 86.9 85.2  PLT 187 183 207   CBG:  Recent Labs Lab 02/08/13 0826 02/08/13 1133 02/08/13 1630  02/08/13 2149 02/09/13 0747  GLUCAP 172* 248* 226* 185* 164*    Recent Results (from the past 240 hour(s))  TECHNOLOGIST REVIEW     Status: None   Collection Time    02/01/13  8:20 AM      Result Value Range Status   Technologist Review Rare blast, occ Metas and Myelocytes present   Final  URINE CULTURE     Status: None   Collection Time    02/07/13  8:37 PM      Result Value Range Status   Specimen Description URINE, RANDOM   Final   Special Requests ADDED 2104   Final   Culture  Setup Time     Final   Value: 02/07/2013 21:47     Performed at  [facility]   Colony Count     Final   Value: NO GROWTH     Performed at [facility]   Culture     Final   Value: NO GROWTH     Performed at [facility]   Report Status 02/09/2013 FINAL   Final  CULTURE, BLOOD (ROUTINE X 2)     Status: None   Collection Time    02/08/13  9:06 AM      Result Value Range Status   Specimen Description BLOOD LEFT FOREARM   Final   Special Requests BOTTLES DRAWN AEROBIC AND ANAEROBIC 10CC   Final   Culture  Setup Time     Final   Value: 02/08/2013 13:26     Performed at [facility]   Culture     Final   Value:        BLOOD CULTURE RECEIVED NO GROWTH TO DATE CULTURE WILL BE HELD FOR 5 DAYS BEFORE ISSUING A FINAL NEGATIVE REPORT     Performed at [facility]   Report Status PENDING   Incomplete  CULTURE, BLOOD (ROUTINE X 2)     Status: None   Collection Time    02/08/13  9:19 AM      Result Value Range Status   Specimen Description BLOOD LEFT ARM   Final   Special Requests BOTTLES DRAWN AEROBIC AND ANAEROBIC 10CC   Final   Culture  Setup Time     Final   Value: 02/08/2013 13:26     Performed at [facility]   Culture     Final   Value:        BLOOD CULTURE RECEIVED NO GROWTH TO DATE CULTURE WILL BE HELD FOR 5 DAYS BEFORE ISSUING A FINAL NEGATIVE REPORT     Performed at [facility]   Report Status PENDING   Incomplete     Studies: Dg Chest 2 View  02/07/2013   CLINICAL DATA:  Weakness.  EXAM: CHEST  2 VIEW  COMPARISON:  November 26, 2012.  FINDINGS: Cardiomediastinal silhouette appears normal. Left lung is clear. Right internal jugular Port-A-Cath is unchanged in position. There is interval development of right basilar opacity consistent with pneumonia or atelectasis. No pneumothorax is noted.  IMPRESSION: Right lower lobe pneumonia or atelectasis. Followup radiographs are recommended.   Electronically Signed   By: Roque Lias M.D.   On: 02/07/2013 21:24    Scheduled Meds: . amLODipine  10  mg Oral Daily  . aspirin  81 mg Oral Daily  . cholecalciferol  1,000 Units Oral Daily  . deferasirox  10 mg/kg Oral QAC breakfast  . insulin aspart  0-15 Units Subcutaneous TID  WC  . [START ON 02/10/2013] insulin glargine  12 Units Subcutaneous Daily  . levalbuterol  0.63 mg Nebulization Q8H  . meropenem (MERREM) IV  1 g Intravenous Q12H  . metoprolol  50 mg Oral BID  . omega-3 acid ethyl esters  1,000 mg Oral Daily  . simvastatin  5 mg Oral QHS  . sodium chloride  3 mL Intravenous Q12H  . vancomycin  750 mg Intravenous Q24H   Continuous Infusions:    Principal Problem:   HCAP (healthcare-associated pneumonia) Active Problems:   Anemia   UTI (lower urinary tract infection)   Hyponatremia     Algis Downs, PA-C [phone] [facility] If 7PM-7AM, please contact night-coverage at [email or web address], password [facility] 02/09/2013, 10:17 AM  LOS: 2 days         Addendum  Patient seen and examined, chart and data base reviewed.  I agree with the above assessment and plan.  For full details please see Mrs. Algis Downs PA note.   Clint Lipps, MD [facility] Pager: [phone] 03/25/2013, 1:32 PM

## 2013-02-10 ENCOUNTER — Encounter (HOSPITAL_COMMUNITY): Payer: Self-pay | Admitting: Radiology

## 2013-02-10 ENCOUNTER — Inpatient Hospital Stay (HOSPITAL_COMMUNITY): Payer: Medicare Other

## 2013-02-10 DIAGNOSIS — J90 Pleural effusion, not elsewhere classified: Secondary | ICD-10-CM

## 2013-02-10 DIAGNOSIS — J189 Pneumonia, unspecified organism: Secondary | ICD-10-CM | POA: Diagnosis not present

## 2013-02-10 DIAGNOSIS — E119 Type 2 diabetes mellitus without complications: Secondary | ICD-10-CM

## 2013-02-10 LAB — CBC
Hemoglobin: 8.7 g/dL — ABNORMAL LOW (ref 12.0–15.0)
MCV: 86 fL (ref 78.0–100.0)
Platelets: 215 10*3/uL (ref 150–400)
RBC: 2.92 MIL/uL — ABNORMAL LOW (ref 3.87–5.11)
WBC: 10.2 10*3/uL (ref 4.0–10.5)

## 2013-02-10 LAB — BASIC METABOLIC PANEL
CO2: 27 mEq/L (ref 19–32)
Chloride: 101 mEq/L (ref 96–112)
GFR calc Af Amer: >90 mL/min (ref >90)
Glucose, Bld: 196 mg/dL — ABNORMAL HIGH (ref 70–99)
Sodium: 135 mEq/L (ref 135–145)

## 2013-02-10 LAB — EXPECTORATED SPUTUM ASSESSMENT W GRAM STAIN, RFLX TO RESP C

## 2013-02-10 LAB — GLUCOSE, CAPILLARY: Glucose-Capillary: 267 mg/dL — ABNORMAL HIGH (ref 70–99)

## 2013-02-10 MED ORDER — IOHEXOL 300 MG/ML  SOLN
80.0000 mL | Freq: Once | INTRAMUSCULAR | Status: AC | PRN
  Administered 2013-02-10: 80 mL via INTRAVENOUS

## 2013-02-10 MED ORDER — DEFERASIROX 500 MG PO TBSO
10.0000 mg/kg | ORAL_TABLET | Freq: Every day | ORAL | Status: DC
  Administered 2013-02-11 – 2013-02-12 (×2): 500 mg via ORAL
  Filled 2013-02-10 (×2): qty 1

## 2013-02-10 MED ORDER — INSULIN GLARGINE 100 UNIT/ML ~~LOC~~ SOLN
14.0000 [IU] | Freq: Every day | SUBCUTANEOUS | Status: DC
  Administered 2013-02-10 – 2013-02-12 (×3): 14 [IU] via SUBCUTANEOUS
  Filled 2013-02-10 (×3): qty 0.14

## 2013-02-10 NOTE — Progress Notes (Signed)
[facility] PROGRESS NOTE  ALYNNA HARGROVE WUJ:[id number] DOB: Aug 21, 1925 DOA: 02/07/2013 PCP: [name], MD Oncologist Dr Welton Flakes.   HPI/Subjective Ms. Cromie mentions she has mold in her floors/carpet/book cases and books at home.  She has tried to clean this herself, and has replaced the 77 yo carpet.  Reports no improvement in her breathing so far.  Was not on oxygen prior to admission.   Assessment/Plan:  HCA Pneumonia CXR- Right lower lobe pneumonia or atelectasis. Started on Levaquin, changed to Vanco & meropenem on 10/13.    Fever curve trending down.  Afebrile 10/15. Blood cultures NGTD Will check chest CT (10/15) to rule out mucous plugging  Pulmonary Consultation appreciated. Speech swallow evaluation showed possible mild pharyngeal dysphagia.  Unlikely aspiration.   Hypoxia (10/14) Secondary to HCAP Oxygen sats at 87% lying in bed.   Started on Oxygen nasal canula.   Possible Urinary Tract Infection Weakly diagnostic UA results, patient was symptomatic,  urine culture shows no growth.   Hyponatremia with weakness Dehydration and hyponatremia resolved with IVF Urine osmolality decreased with normal urine sodium   Diabetes Mellitus, Type II Increased Lantus to 14 units (10/15) with SSI- moderate (increased 10/14) carb modified diet   Lab Results  Component Value Date   HGBA1C 8.8* 01/29/2013    CBG (last 3)   Recent Labs  02/09/13 1741 02/09/13 2144 02/10/13 0835  GLUCAP 252* 220* 197*     Acute Myeloid Leukemia  Stable. Managed by Augustin Schooling, NP, (Dr Welton Flakes) Last Chemo was 6 months ago.  Prognosis is reported as poor She receives palliative transfusions Continue Ex-Jade iron chelation therapy.  Patient will bring medicine from home.   Code Status: FULL Family Communication:  Disposition Plan: Inpatient.  D/C to home when medically appropriate.     Consultants:  Pulmonary  Consultation   Procedures:  None    Antibiotics:  Levaquin: Started 02/07/13  Meropenem: Started 02/08/13    DVT Prophylaxis:  SCDs    Objective: Filed Vitals:   02/10/13 0519  BP: 154/65  Pulse: 99  Temp: 98.9 F (37.2 C)  Resp: 18    Intake/Output Summary (Last 24 hours) at 02/10/13 0858 Last data filed at 02/10/13 0726  Gross per 24 hour  Intake    910 ml  Output   1500 ml  Net   -590 ml   Filed Weights   02/07/13 2021 02/07/13 2357  Weight: 51.211 kg (112 lb 14.4 oz) 49.896 kg (110 lb)    Exam:   General:  Elderly, sharp, pleasant Caucasian female, in no acute distress  Cardiovascular: RRR, no rubs, gallops, or murmurs, distal pulses intact  Respiratory: no accessory muscle use, decreased BS on lower right.  Abdomen: soft, non-distended, bowel sounds present, non-tender  Musculoskeletal: no edema, normal ROM  Data Reviewed: Basic Metabolic Panel:  Recent Labs Lab 02/07/13 2047 02/08/13 0510 02/09/13 0345 02/10/13 0620  NA 124* 131* 134* 135  K 3.9 4.1 3.8 4.1  CL 89* 96 100 101  CO2 24 28 24 27   GLUCOSE 271* 165* 174* 196*  BUN 19 13 16 15   CREATININE 0.78 0.60 0.62 0.62  CALCIUM 8.8 9.0 8.5 8.4   Liver Function Tests:  Recent Labs Lab 02/07/13 2047  AST 33  ALT 34  ALKPHOS 55  BILITOT 0.9  PROT 6.5  ALBUMIN 3.2*   CBC:  Recent Labs Lab 02/07/13 2047 02/08/13 0510 02/09/13 0345 02/10/13 0620  WBC 13.3* 12.2* 12.7* 10.2  NEUTROABS 12.3*  --   --   --  HGB 8.9* 9.1* 8.4* 8.7*  HCT 25.8* 27.2* 24.7* 25.1*  MCV 85.1 86.9 85.2 86.0  PLT 187 183 207 215   CBG:  Recent Labs Lab 02/09/13 0747 02/09/13 1202 02/09/13 1741 02/09/13 2144 02/10/13 0835  GLUCAP 164* 229* 252* 220* 197*    Recent Results (from the past 240 hour(s))  TECHNOLOGIST REVIEW     Status: None   Collection Time    02/01/13  8:20 AM      Result Value Range Status   Technologist Review Rare blast, occ Metas and Myelocytes present    Final  URINE CULTURE     Status: None   Collection Time    02/07/13  8:37 PM      Result Value Range Status   Specimen Description URINE, RANDOM   Final   Special Requests ADDED 2104   Final   Culture  Setup Time     Final   Value: 02/07/2013 21:47     Performed at [facility]   Colony Count     Final   Value: NO GROWTH     Performed at [facility]   Culture     Final   Value: NO GROWTH     Performed at [facility]   Report Status 02/09/2013 FINAL   Final  CULTURE, BLOOD (ROUTINE X 2)     Status: None   Collection Time    02/08/13  9:06 AM      Result Value Range Status   Specimen Description BLOOD LEFT FOREARM   Final   Special Requests BOTTLES DRAWN AEROBIC AND ANAEROBIC 10CC   Final   Culture  Setup Time     Final   Value: 02/08/2013 13:26     Performed at [facility]   Culture     Final   Value:        BLOOD CULTURE RECEIVED NO GROWTH TO DATE CULTURE WILL BE HELD FOR 5 DAYS BEFORE ISSUING A FINAL NEGATIVE REPORT     Performed at [facility]   Report Status PENDING   Incomplete  CULTURE, BLOOD (ROUTINE X 2)     Status: None   Collection Time    02/08/13  9:19 AM      Result Value Range Status   Specimen Description BLOOD LEFT ARM   Final   Special Requests BOTTLES DRAWN AEROBIC AND ANAEROBIC 10CC   Final   Culture  Setup Time     Final   Value: 02/08/2013 13:26     Performed at [facility]   Culture     Final   Value:        BLOOD CULTURE RECEIVED NO GROWTH TO DATE CULTURE WILL BE HELD FOR 5 DAYS BEFORE ISSUING A FINAL NEGATIVE REPORT     Performed at [facility]   Report Status PENDING   Incomplete     Studies: Dg Chest 2 View  02/07/2013   CLINICAL DATA:  Weakness.  EXAM: CHEST  2 VIEW  COMPARISON:  November 26, 2012.  FINDINGS: Cardiomediastinal silhouette appears normal. Left lung is clear. Right internal jugular Port-A-Cath is unchanged in position. There is interval development of right basilar opacity  consistent with pneumonia or atelectasis. No pneumothorax is noted.  IMPRESSION: Right lower lobe pneumonia or atelectasis. Followup radiographs are recommended.   Electronically Signed   By: Roque Lias M.D.   On: 02/07/2013 21:24    Scheduled Meds: . amLODipine  10 mg  Oral Daily  . aspirin  81 mg Oral Daily  . cholecalciferol  1,000 Units Oral Daily  . [START ON 02/11/2013] deferasirox  10 mg/kg Oral QAC breakfast  . feeding supplement (GLUCERNA SHAKE)  237 mL Oral Q24H  . insulin aspart  0-15 Units Subcutaneous TID WC  . insulin glargine  14 Units Subcutaneous Daily  . meropenem (MERREM) IV  1 g Intravenous Q12H  . metoprolol  50 mg Oral BID  . omega-3 acid ethyl esters  1,000 mg Oral Daily  . simvastatin  5 mg Oral QHS  . sodium chloride  3 mL Intravenous Q12H  . vancomycin  750 mg Intravenous Q24H   Continuous Infusions:    Principal Problem:   HCAP (healthcare-associated pneumonia) Active Problems:   Anemia   Acute myeloid leukemia, without mention of having achieved remission   UTI (lower urinary tract infection)   Hyponatremia   Pneumonia   Immunocompromised state   Myelodysplastic syndrome   Penicillin allergy     Algis Downs, PA-C [phone] [facility] If 7PM-7AM, please contact night-coverage at [email or web address], password [facility] 02/10/2013, 8:58 AM  LOS: 3 days

## 2013-02-10 NOTE — Progress Notes (Signed)
Addendum  Patient seen and examined, chart and data base reviewed.  I agree with the above assessment and plan.  For full details please see Mrs. Algis Downs PA note.  Question of obstruction of the bronchus to the RLL,? Layering effusion, obtain CT scan of the chest.  Continue antibiotics, if patient does not improve adding antifungal probably will be warranted.  Clint Lipps, MD [facility] Pager: [phone] 02/10/2013, 11:08 AM

## 2013-02-10 NOTE — Progress Notes (Signed)
Speech Language Pathology Treatment: Dysphagia  Patient Details Name: FARRON LAFOND MRN: [id number] DOB: 07/09/1925 Today's Date: 02/10/2013 Time: 9147-8295 SLP Time Calculation (min): 12 min  Assessment / Plan / Recommendation Clinical Impression  Treatment focused on dysphagia therapy and facilitation of swallow safety.  No indications of aspiration or pharyngeal deficits exhibited following solid texture trials and thin liquids.  No globus sensation reported.  SLP educated pt. On strategies to minimize/manage esophageal dysfunction if present.  Do not suspect aspiration during the swallow.  Recommend she continue regular texture diet and thin liquids.  ST will sign off.  Please re-consult if further assistance is needed.        Pertinent Vitals N/A  SLP Plan  All goals met    Recommendations Diet recommendations: Regular;Thin liquid Liquids provided via: Cup Medication Administration: Whole meds with liquid Supervision: Patient able to self feed Compensations: Slow rate;Small sips/bites Postural Changes and/or Swallow Maneuvers: Seated upright 90 degrees;Upright 30-60 min after meal              Oral Care Recommendations: Oral care BID Follow up Recommendations: None Plan: All goals met    GO     Royce Macadamia M.Ed [employer] [phone]  02/10/2013

## 2013-02-10 NOTE — Consult Note (Addendum)
PULMONARY  / CRITICAL CARE MEDICINE  Name: LASASHA BROPHY MRN: [id number] DOB: 05-23-1925    ADMISSION DATE:  02/07/2013 CONSULTATION DATE:  02/09/2013  REFERRING MD :  Thedore Mins PRIMARY SERVICE:  Internal  CHIEF COMPLAINT:  SOB/Dyspnea  BRIEF PATIENT DESCRIPTION: 79 y.o. F admitted 02/07/13 with fever and weakness. CXR concerning for PNA and UA suggestive of UTI.  CXR worsening noted 10/14 & PCCM consulted for further evaluation.  SIGNIFICANT EVENTS / STUDIES:  10/12 - admitted for PNA and UTI 10/14 - increased SOB requiring O2, CXR worse with small right effusion. New left base opacity.  LINES / TUBES: Port A Cath 10/13 >>>  CULTURES: Blood x 2 10/13 >>> Urine 10/12 >>> neg Sputum 10/14 >>> U. Strep 10/14>>>neg U. Legionella 10/14>>>  ANTIBIOTICS: Levaquin 10/12 >>> 10/13 Meropenem 10/13 >>> Vancomycin 10/13 >>>  SUBJECTIVE:  Pt denies fevers, chills.  Minimal sputum production & cough.   Feels better with oxygen in place.    VITAL SIGNS: Temp:  [96 F (35.6 C)-99.4 F (37.4 C)] 98.9 F (37.2 C) (10/15 0519) Pulse Rate:  [95-113] 99 (10/15 0519) Resp:  [17-18] 18 (10/15 0519) BP: (130-154)/(56-69) 154/65 mmHg (10/15 0519) SpO2:  [89 %-96 %] 89 % (10/15 0519)  PHYSICAL EXAMINATION: General:  Elderly female sitting up in bed.  In NAD. Neuro:  A&O x 3.  Mood and affect appropriate, follows commands. HEENT:  [place]/AT. PERRL.  MMM. Neck:  No LAD or JVD. Cardiovascular:  RRR, no M/R/G. Lungs:  Slightly diminished on R, otherwise CTA.  No W/R. Abdomen:  BS x 4.  Soft, NT/ND. Musculoskeletal:  No gross deformities.  Moves all 4 extremities actively.  No edema. Skin:  Warm and dry.  No rashes.   Recent Labs Lab 02/08/13 0510 02/09/13 0345 02/10/13 0620  NA 131* 134* 135  K 4.1 3.8 4.1  CL 96 100 101  CO2 28 24 27   BUN 13 16 15   CREATININE 0.60 0.62 0.62  GLUCOSE 165* 174* 196*    Recent Labs Lab 02/08/13 0510 02/09/13 0345 02/10/13 0620  HGB 9.1* 8.4* 8.7*   HCT 27.2* 24.7* 25.1*  WBC 12.2* 12.7* 10.2  PLT 183 207 215   Dg Chest 2 View  02/09/2013   CLINICAL DATA:  Pneumonia. Pleural effusion. Shortness of breath.  EXAM: CHEST  2 VIEW  COMPARISON:  PA and lateral chest 02/07/2013 and single view of the chest 02/09/2013.  FINDINGS: Right effusion has increased since the prior PA and lateral films. Right basilar airspace disease also appears worsened. The left lung is clear. Heart size is normal. No pneumothorax.  IMPRESSION: Increased right effusion and airspace disease over the past 2 days   Electronically Signed   By: Drusilla Kanner M.D.   On: 02/09/2013 15:36   Dg Chest Port 1 View  02/09/2013   CLINICAL DATA:  Pneumonia, followup  EXAM: PORTABLE CHEST - 1 VIEW  COMPARISON:  Chest x-ray of 02/07/2013  FINDINGS: There has been worsening of opacity at the right lung base consistent with pneumonia and possible small right effusion. There is also now a patchy opacity medially at the left lung base overlying the heart shadow suspicious for a focus of pneumonia as well. No left pleural effusion is seen. A right-sided Port-A-Cath is unchanged in position and cardiomegaly is stable.  IMPRESSION: Worsening of right basilar opacity consistent with pneumonia and effusion. New opacity at the left base also consistent with multifocal pneumonia.   Electronically Signed   By: Renae Fickle  Gery Pray M.D.   On: 02/09/2013 07:55    ASSESSMENT / PLAN:  SOB/Hypoxia - in the setting of progressive HCAP demonstrated by worsened CXR 10/14.   HCAP - mild worsening of RLL infiltrate R Pleural Effusion - small on cxr  P: - Continue current abx. - Monitor fever curve/WBC - O2 to maintain sats > 90%. - f/u sputum cultures. - f/u U. Legionella (? If sent) - Given pt's history and immunocompromised state (leukemia s/p chemo), Doubt need for antifungal , consider only if clinically worsening - no role for steroids. - reviewed Ct chest -Will plan for diagnostic sampling of  pleural fluid on 10/16 am.    [place] Assessment:  10/15 - assessed with ultrasound at bedside, see pictures in paper chart.  Small to moderate amount of free flowing pleural fluid with mobile lung flap in view.    All other problems managed by primary team.   Canary Brim, NP-C [facility] Pgr: [phone] or [phone]  Independently examined pt, evaluated data & formulated above care plan with NP who scribed this note & edited by me.  ALVA,RAKESH V.  [phone] 02/10/2013, 9:33 AM

## 2013-02-10 NOTE — Clinical Social Work Psychosocial (Signed)
Clinical Social Work Department BRIEF PSYCHOSOCIAL ASSESSMENT 02/10/2013  Patient:  Meredith Rodriguez, Meredith Rodriguez     Account Number:  [id number]     Admit date:  02/07/2013  Clinical Social Worker:  Lavell Luster  Date/Time:  02/10/2013 01:50 PM  Referred by:  Physician  Date Referred:  02/10/2013 Referred for  Other - See comment   Other Referral:   Health concerns within home environment.   Interview type:  Patient Other interview type:    PSYCHOSOCIAL DATA Living Status:  HUSBAND Admitted from facility:   Level of care:   Primary support name:  Axel Meas [phone] Primary support relationship to patient:  SPOUSE Degree of support available:   Support is good.    CURRENT CONCERNS Current Concerns  Other - See comment   Other Concerns:   Health concerns in living environment.    SOCIAL WORK ASSESSMENT / PLAN CSW met with patient to discuss the health concerns she reports within her home. Patient currently lives with husband Trudee Chirino. Patient states that he home has mold growing all throughout the house. Patient states that husband does not think it's a "big deal". CSW educated patient on how these conditions are not healthy conditions to live in. CSW asked if patient would be interested in having someone from the [facility] assess her home and provide resources for needed repairs and maintenance. Patient is agreeable to this.   Assessment/plan status:  No Further Intervention Required Other assessment/ plan:   Complete Healthy Homes Assessment and Housing Counseling referral form and fax to [facility].   Information/referral to community resources:    PATIENT'S/FAMILY'S RESPONSE TO PLAN OF CARE: Patient is agreeable to having [employer] inspect her home and provide any needed resources or housing counseling needs. Patient is pleasant and appreciative of CSW contact. CSW signing off at this time.    Roddie Mc, [place], [place],  [phone]

## 2013-02-11 ENCOUNTER — Inpatient Hospital Stay (HOSPITAL_COMMUNITY): Payer: Medicare Other

## 2013-02-11 LAB — GLUCOSE, CAPILLARY
Glucose-Capillary: 250 mg/dL — ABNORMAL HIGH (ref 70–99)
Glucose-Capillary: 160 mg/dL — ABNORMAL HIGH (ref 70–99)

## 2013-02-11 LAB — BODY FLUID CELL COUNT WITH DIFFERENTIAL
Lymphs, Fluid: 34 %
Monocyte-Macrophage-Serous Fluid: 3 % — ABNORMAL LOW (ref 50–90)
Neutrophil Count, Fluid: 63 % — ABNORMAL HIGH (ref 0–25)
Total Nucleated Cell Count, Fluid: 402 cu mm (ref 0–1000)

## 2013-02-11 LAB — LEGIONELLA ANTIGEN, URINE

## 2013-02-11 LAB — VANCOMYCIN, TROUGH: Vancomycin Tr: 6.4 ug/mL — ABNORMAL LOW (ref 10.0–20.0)

## 2013-02-11 LAB — LACTATE DEHYDROGENASE, PLEURAL OR PERITONEAL FLUID: LD, Fluid: 127 U/L — ABNORMAL HIGH (ref 3–23)

## 2013-02-11 MED ORDER — VANCOMYCIN HCL IN DEXTROSE 1-5 GM/200ML-% IV SOLN
1000.0000 mg | INTRAVENOUS | Status: DC
  Filled 2013-02-11: qty 200

## 2013-02-11 MED ORDER — VANCOMYCIN HCL IN DEXTROSE 1-5 GM/200ML-% IV SOLN
1000.0000 mg | INTRAVENOUS | Status: DC
  Administered 2013-02-11: 1000 mg via INTRAVENOUS
  Filled 2013-02-11 (×4): qty 200

## 2013-02-11 NOTE — Progress Notes (Signed)
PULMONARY  / CRITICAL CARE MEDICINE  Name: Meredith Rodriguez MRN: [id number] DOB: 1925/05/08    ADMISSION DATE:  02/07/2013 CONSULTATION DATE:  02/09/2013  REFERRING MD :  Thedore Mins PRIMARY SERVICE:  Internal  CHIEF COMPLAINT:  SOB/Dyspnea  BRIEF PATIENT DESCRIPTION: 79 y.o. F admitted 02/07/13 with fever and weakness. CXR concerning for PNA and UA suggestive of UTI.  CXR worsening noted 10/14 & PCCM consulted for further evaluation.  SIGNIFICANT EVENTS / STUDIES:  10/12 - admitted for PNA and UTI 10/14 - increased SOB requiring O2, CXR worse with small right effusion. New left base opacity.  LINES / TUBES: Port A Cath 10/13 >>>  CULTURES: Blood x 2 10/13 >>>ng Urine 10/12 >>> neg Sputum 10/14 >>> U. Strep 10/14>>>neg U. Legionella 10/14>>>neg  ANTIBIOTICS: Levaquin 10/12 >>> 10/13 Meropenem 10/13 >>> Vancomycin 10/13 >>>  SUBJECTIVE:  NO fevers, chills.  Minimal sputum production & cough.   No chest pain  VITAL SIGNS: Temp:  [97.4 F (36.3 C)-98.8 F (37.1 C)] 97.4 F (36.3 C) (10/16 1335) Pulse Rate:  [86-102] 88 (10/16 1335) Resp:  [16-18] 16 (10/16 1335) BP: (125-151)/(46-70) 125/62 mmHg (10/16 1335) SpO2:  [95 %-98 %] 98 % (10/16 1335)  PHYSICAL EXAMINATION: General:  Elderly female sitting up in bed.  In NAD. Neuro:  A&O x 3.  Mood and affect appropriate, follows commands. HEENT:  [place]/AT. PERRL.  MMM. Neck:  No LAD or JVD. Cardiovascular:  RRR, no M/R/G. Lungs:  Slightly diminished on R, otherwise CTA.  No W/R. Abdomen:  BS x 4.  Soft, NT/ND. Musculoskeletal:  No gross deformities.  Moves all 4 extremities actively.  No edema. Skin:  Warm and dry.  No rashes.   Recent Labs Lab 02/08/13 0510 02/09/13 0345 02/10/13 0620  NA 131* 134* 135  K 4.1 3.8 4.1  CL 96 100 101  CO2 28 24 27   BUN 13 16 15   CREATININE 0.60 0.62 0.62  GLUCOSE 165* 174* 196*    Recent Labs Lab 02/08/13 0510 02/09/13 0345 02/10/13 0620  HGB 9.1* 8.4* 8.7*  HCT 27.2* 24.7* 25.1*   WBC 12.2* 12.7* 10.2  PLT 183 207 215   Ct Chest W Contrast  02/10/2013   CLINICAL DATA:  Shortness of breath and pleural effusion.  EXAM: CT CHEST WITH CONTRAST  TECHNIQUE: Multidetector CT imaging of the chest was performed during intravenous contrast administration.  CONTRAST:  80mL OMNIPAQUE IOHEXOL 300 MG/ML  SOLN  COMPARISON:  Multiple prior chest x-rays.  FINDINGS: The chest wall is unremarkable. No breast masses, supraclavicular or axillary adenopathy. A right-sided Port-A-Cath is noted. The bony thorax is intact. No destructive bone lesions or spinal canal compromise.  The is normal in size. No pericardial effusion. There is a moderate pectus deformity displacing the heart to the left and there is mild mass effect on the right atrium. Three-vessel coronary artery calcifications are noted. Mild enlargement of left atrium. The aorta is normal in caliber. Moderate atherosclerotic calcifications. No dissection. There are scattered mediastinal and hilar lymph nodes which are likely inflammatory. No mass or overt adenopathy.  Examination of the lung parenchyma demonstrates dense right lower lobe airspace consolidation, likely pneumonia with a moderate-sized para pneumonic effusion I do not see any definite pleural enhancement to suggest empyema.  No obvious endobronchial lesion or centrally obstructing mass. The left lung is clear.  The upper abdomen is unremarkable except for severe atherosclerotic disease involving the aorta.  IMPRESSION: Dense right lower lobe airspace consolidation/pneumonia with a large right-sided  parapneumonic effusion. No definite CT findings to suggest empyema.  Inflammatory right hilar and mediastinal lymph nodes.  Advanced atherosclerotic calcifications involving the aorta and coronary arteries.   Electronically Signed   By: Loralie Champagne M.D.   On: 02/10/2013 12:26    ASSESSMENT / PLAN:  SOB/Hypoxia - in the setting of progressive HCAP demonstrated by worsened CXR 10/14.    HCAP - mild worsening of RLL infiltrate, - immunocompromised state (leukemia s/p chemo) R Pleural Effusion -   P: - Continue zosyn, dc vanc since no MRSA isolated - Monitor fever curve/WBC , Doubt need for antifungal , consider only if clinically worsening - no role for steroids. - reviewed Ct chest - thoracentesis today    Milburn Freeney V.  [phone] 02/11/2013, 3:06 PM

## 2013-02-11 NOTE — Evaluation (Signed)
Physical Therapy Evaluation #2 Patient Details Name: Meredith Rodriguez MRN: [id number] DOB: Sep 06, 1925 Today's Date: 02/11/2013 Time: 4098-1191 PT Time Calculation (min): 20 min  PT Assessment / Plan / Recommendation History of Present Illness  Meredith Rodriguez is a 79 y.o. female history of a.m. on, diabetes mellitus type 2, anemia had transfusion last week presents to the ER because of increasing weakness. Patient checked her temperature today and was found to be febrile with temperature of around 102F at home. Patient also had some blood-tinged urine. Patient has been experiencing some nonproductive cough over the last one week. In the ER chest x-ray shows features concerning for pneumonia and UA shows features of UTI.  Pt s/p thoracentesis 02/07/13  Clinical Impression  Pt is showing signs of deconditioning from prolonged hospital stay.  She is borderline for need of O2 with mobility (O2 ranged from 89-93% on RA with gait).  Pt with more staggering during gait today than on last evaluation and is reporting leg fatigue with gait.  I think we can avoid both home O2 and home PT if the pt can get up and walk more during her hospital stay.  Pt will therefore follow acutely to work on gait and high level balance activities.      PT Assessment  Patient needs continued PT services    Follow Up Recommendations  No PT follow up;Supervision - Intermittent    Does the patient have the potential to tolerate intense rehabilitation     NA  Barriers to Discharge   None      Equipment Recommendations  None recommended by PT    Recommendations for Other Services     Frequency Min 3X/week    Precautions / Restrictions   None  Pertinent Vitals/Pain O2 sats 89-93% on RA with gait.  No DOE.  HR stable.        Mobility  Bed Mobility Bed Mobility: Supine to Sit;Sitting - Scoot to Edge of Bed;Sit to Supine Supine to Sit: 6: Modified independent (Device/Increase time);With rails;HOB elevated Sitting - Scoot to  Edge of Bed: 6: Modified independent (Device/Increase time);With rail Sit to Supine: 6: Modified independent (Device/Increase time);With rail;HOB elevated Details for Bed Mobility Assistance: pt moved into and out of bed easily with railings.  Transfers Sit to Stand: 6: Modified independent (Device/Increase time);With upper extremity assist;With armrests;From bed Stand to Sit: 6: Modified independent (Device/Increase time);With upper extremity assist;With armrests;To bed Details for Transfer Assistance: Pt using hands for stability during transitions.   Ambulation/Gait Ambulation/Gait Assistance: 4: Min guard Ambulation Distance (Feet): 450 Feet Assistive device: None Ambulation/Gait Assistance Details: Min guard assist due to 3-4 small LOB (staggering) noted during gait.  She is likely feeling the effects of a prolonged hospital stay.  She reports she has only been up and moving around the room, not really in the hallway.  O2 removed to test during gait.  O2 sats dropped to 89% on RA, no signs or reports of DOE, only the occational dry cough.   Gait Pattern: Step-through pattern (staggering, especially while walking and talking. ) Gait velocity: decreased        PT Diagnosis: Difficulty walking;Abnormality of gait  PT Problem List: Decreased activity tolerance;Decreased balance;Decreased mobility;Cardiopulmonary status limiting activity PT Treatment Interventions: Gait training;Stair training;Functional mobility training;Therapeutic activities;Therapeutic exercise;Balance training;Neuromuscular re-education;Patient/family education     PT Goals(Current goals can be found in the care plan section) Acute Rehab PT Goals Patient Stated Goal: return home  PT Goal Formulation: With patient Time  For Goal Achievement: 02/25/13 Potential to Achieve Goals: Good  Visit Information  Last PT Received On: 02/11/13 Assistance Needed: +1 History of Present Illness: Meredith Rodriguez is a 79 y.o. female  history of a.m. on, diabetes mellitus type 2, anemia had transfusion last week presents to the ER because of increasing weakness. Patient checked her temperature today and was found to be febrile with temperature of around 102F at home. Patient also had some blood-tinged urine. Patient has been experiencing some nonproductive cough over the last one week. In the ER chest x-ray shows features concerning for pneumonia and UA shows features of UTI.  Pt s/p thoracentesis 02/07/13       Prior Functioning  Home Living Family/patient expects to be discharged to:: Private residence Living Arrangements: Spouse/significant other Available Help at Discharge: Family;Available 24 hours/day Type of Home: House Home Access: Stairs to enter [employer] of Steps: 1 Entrance Stairs-Rails: None Home Layout: One level Home Equipment: Cane - single point Additional Comments: per pt report she does not use an assistive device.  Prior Function Level of Independence: Independent Comments: pr reports she occationally drives even though family does not want her to drive due to her poor vision.   Communication Communication: No difficulties    Cognition  Cognition Arousal/Alertness: Awake/alert Behavior During Therapy: WFL for tasks assessed/performed Overall Cognitive Status: Within Functional Limits for tasks assessed    Extremity/Trunk Assessment Upper Extremity Assessment Upper Extremity Assessment: Overall WFL for tasks assessed Lower Extremity Assessment Lower Extremity Assessment: Overall WFL for tasks assessed Cervical / Trunk Assessment Cervical / Trunk Assessment: Normal   Balance Balance Balance Assessed: Yes Static Sitting Balance Static Sitting - Balance Support: Feet supported Static Sitting - Level of Assistance: 7: Independent Static Standing Balance Static Standing - Balance Support: No upper extremity supported Static Standing - Level of Assistance: 7: Independent Dynamic  Standing Balance Dynamic Standing - Balance Support: No upper extremity supported Dynamic Standing - Level of Assistance: 4: Min assist Dynamic Standing - Comments: min guard assist for dynamic tasks while ambulating.   End of Session PT - End of Session Activity Tolerance: Patient limited by fatigue Patient left: in bed;with call bell/phone within reach;with family/visitor present    Lurena Joiner B. Sid Greener, PT, DPT [phone]   02/11/2013, 3:44 PM

## 2013-02-11 NOTE — Procedures (Signed)
Thoracentesis Procedure Note  Pre-operative Diagnosis: RLL pneumonia with effusion  Post-operative Diagnosis: same  Indications: RLL pneumonia with effusion  Procedure Details  Consent: Informed consent was obtained. Risks of the procedure were discussed including: infection, bleeding, pain, pneumothorax.  [place] was used to mark point for thoracentesis. FLuid was noted as an echo free space bounded by diaphragm, chest wall & lung flap. Under sterile conditions the patient was positioned. Betadine solution and sterile drapes were utilized.  1% plain lidocaine was used to anesthetize the right 7th rib space. Fluid was obtained without any difficulties and minimal blood loss.  A dressing was applied to the wound and wound care instructions were provided.   Findings 900 ml of straw colored pleural fluid was obtained. A sample was sent to Pathology for cytology and cell counts, as well as for infection analysis.  Complications:  None; patient tolerated the procedure well.          Condition: stable  Plan A follow up chest x-ray was ordered. Bed Rest for 1 hours. Tylenol 650 mg. for pain.  Attending Attestation: I was present and scrubbed for the entire procedure.  Aubryanna Nesheim V.  [phone]

## 2013-02-11 NOTE — Progress Notes (Signed)
ANTIBIOTIC CONSULT NOTE - Follow-up  Pharmacy Consult for Vancomycin Indication: aspiration PNA  Allergies  Allergen Reactions  . Amoxicillin [Amoxicillin]     Upset stomach  . Cephalexin Nausea Only  . Metformin And Related     Gi problems  . Phenergan [Promethazine Hcl] Other (See Comments)    Unknown   . Procardia [Nifedipine]     dizziness    Patient Measurements: Height: 5\' 6"  (167.6 cm) Weight: 110 lb (49.896 kg) IBW/kg (Calculated) : 59.3 Dosing weight 49.8 kg  Vital Signs: Temp: 97.4 F (36.3 C) (10/16 1335) Temp src: Oral (10/16 1335) BP: 125/62 mmHg (10/16 1335) Pulse Rate: 88 (10/16 1335) Intake/Output from previous day: 10/15 0701 - 10/16 0700 In: 100 [P.O.:100] Out: 150 [Urine:150] Intake/Output from this shift: Total I/O In: 360 [P.O.:360] Out: -   Labs:  Recent Labs  02/09/13 0345 02/10/13 0620  WBC 12.7* 10.2  HGB 8.4* 8.7*  PLT 207 215  CREATININE 0.62 0.62   Estimated Creatinine Clearance: 40.5 ml/min (by C-G formula based on Cr of 0.62).  Recent Labs  02/11/13 1247  VANCOTROUGH 6.4*     Microbiology: Recent Results (from the past 720 hour(s))  TECHNOLOGIST REVIEW     Status: None   Collection Time    01/18/13  9:15 AM      Result Value Range Status   Technologist Review Large & giant platelets   Final  TECHNOLOGIST REVIEW     Status: None   Collection Time    01/25/13  8:34 AM      Result Value Range Status   Technologist Review few metas present   Final  TECHNOLOGIST REVIEW     Status: None   Collection Time    02/01/13  8:20 AM      Result Value Range Status   Technologist Review Rare blast, occ Metas and Myelocytes present   Final  URINE CULTURE     Status: None   Collection Time    02/07/13  8:37 PM      Result Value Range Status   Specimen Description URINE, RANDOM   Final   Special Requests ADDED 2104   Final   Culture  Setup Time     Final   Value: 02/07/2013 21:47     Performed at [facility]   Colony Count     Final   Value: NO GROWTH     Performed at [facility]   Culture     Final   Value: NO GROWTH     Performed at [facility]   Report Status 02/09/2013 FINAL   Final  CULTURE, BLOOD (ROUTINE X 2)     Status: None   Collection Time    02/08/13  9:06 AM      Result Value Range Status   Specimen Description BLOOD LEFT FOREARM   Final   Special Requests BOTTLES DRAWN AEROBIC AND ANAEROBIC 10CC   Final   Culture  Setup Time     Final   Value: 02/08/2013 13:26     Performed at [facility]   Culture     Final   Value:        BLOOD CULTURE RECEIVED NO GROWTH TO DATE CULTURE WILL BE HELD FOR 5 DAYS BEFORE ISSUING A FINAL NEGATIVE REPORT     Performed at [facility]   Report Status PENDING   Incomplete  CULTURE, BLOOD (ROUTINE X 2)     Status: None  Collection Time    02/08/13  9:19 AM      Result Value Range Status   Specimen Description BLOOD LEFT ARM   Final   Special Requests BOTTLES DRAWN AEROBIC AND ANAEROBIC 10CC   Final   Culture  Setup Time     Final   Value: 02/08/2013 13:26     Performed at [facility]   Culture     Final   Value:        BLOOD CULTURE RECEIVED NO GROWTH TO DATE CULTURE WILL BE HELD FOR 5 DAYS BEFORE ISSUING A FINAL NEGATIVE REPORT     Performed at [facility]   Report Status PENDING   Incomplete  CULTURE, EXPECTORATED SPUTUM-ASSESSMENT     Status: None   Collection Time    02/10/13 11:30 AM      Result Value Range Status   Specimen Description SPUTUM   Final   Special Requests Immunocompromised   Final   Sputum evaluation     Final   Value: MICROSCOPIC FINDINGS SUGGEST THAT THIS SPECIMEN IS NOT REPRESENTATIVE OF LOWER RESPIRATORY SECRETIONS. PLEASE RECOLLECT.     CALLED TO J WESSELINK,RN 02/10/13 1330 BY K SCHULTZ   Report Status 02/10/2013 FINAL   Final    Assessment: Meredith Rodriguez presented to the hospital with fever and weakness. She was started on broad-spectrum antibiotics for possible  HCAP. She continue on vancomycin and meropenem. Pt is afebrile and WBC is now WNL. Renal fxn remains stable. A vancomycin trough was checked today and was below goal.   10/13 merropenem>> 10/13 vancomycin>>  10/13 blood x2- ngtd 10/13 urine- neg  Plan:  1. Increase vancomycin to 1gm IV Q18H 2. Continue meropenem 1gm IV Q12H 3. F/u renal fxn, C&S, clinical status and trough at [facility], PharmD, BCPS Pager # [phone] 02/11/2013 1:52 PM

## 2013-02-11 NOTE — Progress Notes (Signed)
Inpatient Diabetes Program Recommendations  AACE/ADA: New Consensus Statement on Inpatient Glycemic Control (2013)  Target Ranges:  Prepandial:   less than 140 mg/dL      Peak postprandial:   less than 180 mg/dL (1-2 hours)      Critically ill patients:  140 - 180 mg/dL     Results for Meredith Rodriguez, Meredith Rodriguez (MRN [id number]) as of 02/11/2013 12:50  Ref. Range 02/10/2013 08:35 02/10/2013 12:26 02/10/2013 17:29 02/10/2013 21:25  Glucose-Capillary Latest Range: 70-99 mg/dL 409 (H) 811 (H) 914 (H) 217 (H)   Results for Meredith Rodriguez, Meredith Rodriguez (MRN [id number]) as of 02/11/2013 12:50  Ref. Range 02/11/2013 08:06 02/11/2013 12:31  Glucose-Capillary Latest Range: 70-99 mg/dL 086 (H) 578 (H)   **Postprandial elevations noted  **PO intake 100% of meals   MD- Please consider adding Novolog meal coverage- Novolog 3 units tid with meals   Will follow. Ambrose Finland RN, MSN, CDE Diabetes Coordinator Inpatient Diabetes Program Team Pager: [phone] (8a-10p)

## 2013-02-11 NOTE — Progress Notes (Signed)
[facility] PROGRESS NOTE  Meredith Rodriguez ZOX:[id number] DOB: 01/15/1926 DOA: 02/07/2013 PCP: [name], MD Oncologist Dr Welton Flakes.   HPI/Subjective Feels much better, less cough, sputum production shortness of breath.  Assessment/Plan:  HCA Pneumonia CXR- Right lower lobe pneumonia or atelectasis. Started on Levaquin, changed to Vanco & meropenem on 10/13.    Fever curve trending down.  Afebrile 10/15. Blood cultures NGTD Will check chest CT (10/15) to rule out mucous plugging  Pulmonary Consultation appreciated. Speech swallow evaluation showed possible mild pharyngeal dysphagia.  Unlikely aspiration. Patient is for thoracentesis this afternoon.  Hypoxia (10/14) Secondary to HCAP Oxygen sats at 87% lying in bed.   Started on Oxygen nasal canula.   Possible Urinary Tract Infection Weakly diagnostic UA results, patient was symptomatic,  urine culture shows no growth.   Hyponatremia with weakness Dehydration and hyponatremia resolved with IVF Urine osmolality decreased with normal urine sodium   Diabetes Mellitus, Type II Increased Lantus to 14 units (10/15) with SSI- moderate (increased 10/14) carb modified diet   Lab Results  Component Value Date   HGBA1C 8.8* 01/29/2013    CBG (last 3)   Recent Labs  02/10/13 2125 02/11/13 0806 02/11/13 1231  GLUCAP 217* 140* 250*     Acute Myeloid Leukemia  Stable. Managed by Augustin Schooling, NP, (Dr Welton Flakes) Last Chemo was 6 months ago.  Prognosis is reported as poor She receives palliative transfusions Continue Ex-Jade iron chelation therapy.  Patient will bring medicine from home.   Code Status: FULL Family Communication:  Disposition Plan: Inpatient.  D/C to home when medically appropriate.     Consultants:  Pulmonary Consultation   Procedures:  None    Antibiotics:  Levaquin: Started 02/07/13  Meropenem: Started 02/08/13    DVT Prophylaxis:  SCDs    Objective: Filed Vitals:    02/11/13 1335  BP: 125/62  Pulse: 88  Temp: 97.4 F (36.3 C)  Resp: 16    Intake/Output Summary (Last 24 hours) at 02/11/13 1441 Last data filed at 02/11/13 1338  Gross per 24 hour  Intake    460 ml  Output      0 ml  Net    460 ml   Filed Weights   02/07/13 2021 02/07/13 2357  Weight: 51.211 kg (112 lb 14.4 oz) 49.896 kg (110 lb)    Exam:   General:  Elderly, sharp, pleasant Caucasian female, in no acute distress  Cardiovascular: RRR, no rubs, gallops, or murmurs, distal pulses intact  Respiratory: no accessory muscle use, decreased BS on lower right.  Abdomen: soft, non-distended, bowel sounds present, non-tender  Musculoskeletal: no edema, normal ROM  Data Reviewed: Basic Metabolic Panel:  Recent Labs Lab 02/07/13 2047 02/08/13 0510 02/09/13 0345 02/10/13 0620  NA 124* 131* 134* 135  K 3.9 4.1 3.8 4.1  CL 89* 96 100 101  CO2 24 28 24 27   GLUCOSE 271* 165* 174* 196*  BUN 19 13 16 15   CREATININE 0.78 0.60 0.62 0.62  CALCIUM 8.8 9.0 8.5 8.4   Liver Function Tests:  Recent Labs Lab 02/07/13 2047  AST 33  ALT 34  ALKPHOS 55  BILITOT 0.9  PROT 6.5  ALBUMIN 3.2*   CBC:  Recent Labs Lab 02/07/13 2047 02/08/13 0510 02/09/13 0345 02/10/13 0620  WBC 13.3* 12.2* 12.7* 10.2  NEUTROABS 12.3*  --   --   --   HGB 8.9* 9.1* 8.4* 8.7*  HCT 25.8* 27.2* 24.7* 25.1*  MCV 85.1 86.9 85.2 86.0  PLT 187  183 207 215   CBG:  Recent Labs Lab 02/10/13 1226 02/10/13 1729 02/10/13 2125 02/11/13 0806 02/11/13 1231  GLUCAP 267* 120* 217* 140* 250*    Recent Results (from the past 240 hour(s))  URINE CULTURE     Status: None   Collection Time    02/07/13  8:37 PM      Result Value Range Status   Specimen Description URINE, RANDOM   Final   Special Requests ADDED 2104   Final   Culture  Setup Time     Final   Value: 02/07/2013 21:47     Performed at [facility]   Colony Count     Final   Value: NO GROWTH     Performed at [facility]   Culture     Final   Value: NO GROWTH     Performed at [facility]   Report Status 02/09/2013 FINAL   Final  CULTURE, BLOOD (ROUTINE X 2)     Status: None   Collection Time    02/08/13  9:06 AM      Result Value Range Status   Specimen Description BLOOD LEFT FOREARM   Final   Special Requests BOTTLES DRAWN AEROBIC AND ANAEROBIC 10CC   Final   Culture  Setup Time     Final   Value: 02/08/2013 13:26     Performed at [facility]   Culture     Final   Value:        BLOOD CULTURE RECEIVED NO GROWTH TO DATE CULTURE WILL BE HELD FOR 5 DAYS BEFORE ISSUING A FINAL NEGATIVE REPORT     Performed at [facility]   Report Status PENDING   Incomplete  CULTURE, BLOOD (ROUTINE X 2)     Status: None   Collection Time    02/08/13  9:19 AM      Result Value Range Status   Specimen Description BLOOD LEFT ARM   Final   Special Requests BOTTLES DRAWN AEROBIC AND ANAEROBIC 10CC   Final   Culture  Setup Time     Final   Value: 02/08/2013 13:26     Performed at [facility]   Culture     Final   Value:        BLOOD CULTURE RECEIVED NO GROWTH TO DATE CULTURE WILL BE HELD FOR 5 DAYS BEFORE ISSUING A FINAL NEGATIVE REPORT     Performed at [facility]   Report Status PENDING   Incomplete  CULTURE, EXPECTORATED SPUTUM-ASSESSMENT     Status: None   Collection Time    02/10/13 11:30 AM      Result Value Range Status   Specimen Description SPUTUM   Final   Special Requests Immunocompromised   Final   Sputum evaluation     Final   Value: MICROSCOPIC FINDINGS SUGGEST THAT THIS SPECIMEN IS NOT REPRESENTATIVE OF LOWER RESPIRATORY SECRETIONS. PLEASE RECOLLECT.     CALLED TO J WESSELINK,RN 02/10/13 1330 BY K SCHULTZ   Report Status 02/10/2013 FINAL   Final     Studies: Dg Chest 2 View  02/07/2013   CLINICAL DATA:  Weakness.  EXAM: CHEST  2 VIEW  COMPARISON:  November 26, 2012.  FINDINGS: Cardiomediastinal silhouette appears normal. Left lung is clear. Right  internal jugular Port-A-Cath is unchanged in position. There is interval development of right basilar opacity consistent with pneumonia or atelectasis. No pneumothorax is noted.  IMPRESSION: Right lower lobe pneumonia or  atelectasis. Followup radiographs are recommended.   Electronically Signed   By: Roque Lias M.D.   On: 02/07/2013 21:24    Scheduled Meds: . amLODipine  10 mg Oral Daily  . aspirin  81 mg Oral Daily  . cholecalciferol  1,000 Units Oral Daily  . deferasirox  10 mg/kg Oral QAC breakfast  . feeding supplement (GLUCERNA SHAKE)  237 mL Oral Q24H  . insulin aspart  0-15 Units Subcutaneous TID WC  . insulin glargine  14 Units Subcutaneous Daily  . meropenem (MERREM) IV  1 g Intravenous Q12H  . metoprolol  50 mg Oral BID  . omega-3 acid ethyl esters  1,000 mg Oral Daily  . simvastatin  5 mg Oral QHS  . sodium chloride  3 mL Intravenous Q12H  . [START ON 02/12/2013] vancomycin  1,000 mg Intravenous Q18H   Continuous Infusions:    Principal Problem:   HCAP (healthcare-associated pneumonia) Active Problems:   Anemia   Acute myeloid leukemia, without mention of having achieved remission   UTI (lower urinary tract infection)   Hyponatremia   Pneumonia   Immunocompromised state   Myelodysplastic syndrome   Penicillin allergy   Parapneumonic effusion     Kent Riendeau A [phone] [facility] If 7PM-7AM, please contact night-coverage at [email or web address], password [facility] 02/11/2013, 2:41 PM  LOS: 4 days

## 2013-02-12 DIAGNOSIS — E1165 Type 2 diabetes mellitus with hyperglycemia: Secondary | ICD-10-CM | POA: Diagnosis present

## 2013-02-12 LAB — CBC
MCH: 29.4 pg (ref 26.0–34.0)
MCHC: 33.9 g/dL (ref 30.0–36.0)
MCV: 86.9 fL (ref 78.0–100.0)
Platelets: 189 10*3/uL (ref 150–400)
RBC: 2.82 MIL/uL — ABNORMAL LOW (ref 3.87–5.11)
RDW: 15.4 % (ref 11.5–15.5)

## 2013-02-12 LAB — PH, BODY FLUID: pH, Fluid: 8.5

## 2013-02-12 LAB — GLUCOSE, CAPILLARY
Glucose-Capillary: 103 mg/dL — ABNORMAL HIGH (ref 70–99)
Glucose-Capillary: 224 mg/dL — ABNORMAL HIGH (ref 70–99)

## 2013-02-12 LAB — CHOLESTEROL, BODY FLUID

## 2013-02-12 MED ORDER — GUAIFENESIN ER 600 MG PO TB12: 1200.0000 mg | ORAL_TABLET | Freq: Two times a day (BID) | ORAL | Status: DC

## 2013-02-12 MED ORDER — LEVOFLOXACIN 750 MG PO TABS: 750.0000 mg | ORAL_TABLET | Freq: Every day | ORAL | Status: DC

## 2013-02-12 NOTE — Progress Notes (Signed)
Received referral from inpatient RNCM for [facility] Care Management services. Spoke with Mrs Tranchina at bedside to explain [facility] Care Management. She states she feels that [facility]. She pleasantly declined services. Will make inpatient RNCM aware of patient's declining of services.  Raiford Noble, MSN-Ed, RN,BSN- [facility] [phone]

## 2013-02-12 NOTE — Discharge Summary (Signed)
Physician Discharge Summary  DAEJA HELDERMAN ZOX:[id number] DOB: 09-06-1925 DOA: 02/07/2013  PCP: [name], MD  Admit date: 02/07/2013 Discharge date: 02/12/2013  Time spent: 40 minutes  Recommendations for Outpatient Follow-up:  1. Followup with primary care physician within one week.  Discharge Diagnoses:  Principal Problem:   HCAP (healthcare-associated pneumonia) Active Problems:   Anemia   Acute myeloid leukemia, without mention of having achieved remission   UTI (lower urinary tract infection)   Hyponatremia   Pneumonia   Immunocompromised state   Myelodysplastic syndrome   Penicillin allergy   Parapneumonic effusion   DM (diabetes mellitus), type 2, uncontrolled   Discharge Condition: Stable  Diet recommendation: Carbohydrate modified diet  Filed Weights   02/07/13 2021 02/07/13 2357  Weight: 51.211 kg (112 lb 14.4 oz) 49.896 kg (110 lb)    History of present illness:  Meredith Rodriguez is a 79 y.o. female history of a.m. on, diabetes mellitus type 2, anemia had transfusion last week presents to the ER because of increasing weakness. Patient checked her temperature today and was found to be febrile with temperature of around 102F at home. Patient also had some blood-tinged urine. Patient has been experiencing some nonproductive cough over the last one week. In the ER chest x-ray shows features concerning for pneumonia and UA shows features of UTI. Patient has been admitted for further management in addition labs also show mild hyponatremia.   Hospital Course:   1. Healthcare associated pneumonia: Patient came in to the hospital with fever of 102, shortness of breath and cough. Chest x-ray showed right lower lobe pneumonia, this treated as a health care associated pneumonia cause of patient gets blood transfusions on regular basis. At the time of admission patient started on Levaquin, did not show much progress so antibiotics were switched to vancomycin and Zosyn, blood  culture stayed negative to date, because of the extensive infiltrates on the x-ray CT scan of the chest was done and showed no PE, no mucus plugging, pulmonology was consulted. Initially there was concern about fungal pneumonia as patient started recent renovations in her home and she said she's been around mold dust lately. Thoracentesis done at bedside with removal of 900 milliliters, her breathing improved after thoracentesis, discussed with Dr. Vassie Loll the day prior to discharge and recommended to treat for a total 14 days of antibiotics. Patient will have 10 more days of Levaquin at home.  2. Hypoxia: This is likely secondary to her pneumonia, axial saturation was 87 when she was lying in the bed, she required supplemental oxygen, now she improved and her oxygen saturation was 92% with ambulation.  3. Hyponatremia with weakness: At the time of admission her sodium was 125, which is likely secondary to volume depletion/dehydration. After hydration with IV fluids sodium normalized.  4. Diabetes mellitus type 2 uncontrolled: Hemoglobin A1c is 8.8 on 01/29/2013, her insulin regimen continued throughout the hospital stay as well as on discharge.  5. Acute myeloid leukemia: Patient follows with Dr. Welton Flakes, last chemotherapy was about 6 months ago, her prognosis reported to be poor. She receives palliative transfusion currently, she is on Exjade iron chelation therapy.   Procedures:  Thoracentesis done by Dr. Vassie Loll on 02/11/2013 with removal of 900 mL of straw-colored fluids  Consultations:  Pulmonology.  Discharge Exam: Filed Vitals:   02/12/13 0551  BP: 129/69  Pulse: 91  Temp: 99 F (37.2 C)  Resp: 16   General: Alert and awake, oriented x3, not in any acute distress. HEENT:  anicteric sclera, pupils reactive to light and accommodation, EOMI CVS: S1-S2 clear, no murmur rubs or gallops Chest: clear to auscultation bilaterally, no wheezing, rales or rhonchi Abdomen: soft nontender,  nondistended, normal bowel sounds, no organomegaly Extremities: no cyanosis, clubbing or edema noted bilaterally Neuro: Cranial nerves II-XII intact, no focal neurological deficits  Discharge Instructions  Discharge Orders   Future Appointments Provider Department Dept Phone   02/15/2013 8:30 AM Krista Blue [facility] MED[facility]Y [phone]   02/22/2013 8:45 AM Mauri Brooklyn [facility] MED[facility]COL[facility]Y [phone]   02/22/2013 9:15 AM Victorino December, MD [facility] MEDICAL ONCOLOGY [phone]   03/01/2013 8:30 AM Dava Najjar [facility] MED[facility]COL[facility] [phone]   03/08/2013 8:30 AM Delcie Roch [place] [facility] [phone]   03/15/2013 8:30 AM Dava Najjar [facility] MEDICAL ONCOL[facility]Y [phone]   03/22/2013 8:30 AM Beverely Pace [facility] MEDICAL ONCOL[facility] [phone]   03/29/2013 10:15 AM Beverely Pace [facility] MEDICAL ONCOL[facility]Y [phone]   03/29/2013 10:45 AM Illa Level, NP  [facility] MEDICAL ONCOLOGY [phone]   06/01/2013 9:30 AM [name], MD [facility] [phone]   Future Orders Complete By Expires   Diet Carb Modified  As directed    Increase activity slowly  As directed        Medication List         amLODipine 10 MG tablet  Commonly known as:  NORVASC  Take 1 tablet (10 mg total) by mouth daily.     aspirin 81 MG tablet  Take 81 mg by mouth daily.     cholecalciferol 1000 UNITS tablet  Commonly known as:  VITAMIN D  Take 1,000 Units by mouth daily.     deferasirox 500 MG disintegrating tablet  Commonly known as:  EXJADE  Take 1 tablet (500 mg total) by mouth daily before breakfast.     Fish Oil 1000 MG Caps  Take 1,000 mg by mouth daily.     guaiFENesin 600 MG 12 hr tablet  Commonly known as:  MUCINEX  Take 2 tablets (1,200 mg total)  by mouth 2 (two) times daily.     HUMALOG KWIKPEN 100 UNIT/ML Sopn  Generic drug:  insulin lispro  Inject 4 Units into the skin 2 (two) times daily with a meal. Inject 5 units twice daily into the skin with meals. Lunch and dinner     insulin glargine 100 UNIT/ML injection  Commonly known as:  LANTUS SOLOSTAR  Inject 0.1 mLs (10 Units total) into the skin daily.     levofloxacin 750 MG tablet  Commonly known as:  LEVAQUIN  Take 1 tablet (750 mg total) by mouth daily.     lidocaine-prilocaine cream  Commonly known as:  EMLA  Apply topically as needed.     metoprolol 50 MG tablet  Commonly known as:  LOPRESSOR  Take 1 tablet (50 mg total) by mouth 2 (two) times daily.     simvastatin 5 MG tablet  Commonly known as:  ZOCOR  Take 5 mg by mouth at bedtime.       Allergies  Allergen Reactions  . Amoxicillin [Amoxicillin]     Upset stomach  . Cephalexin Nausea Only  . Metformin And Related     Gi problems  . Phenergan [Promethazine Hcl] Other (See Comments)    Unknown   .  Procardia [Nifedipine]     dizziness       Follow-up Information   Follow up with [name], MD In 1 week.   Specialty:  Family Medicine   Contact information:   [address] Christena Flake [place] [address] [phone]        The results of significant diagnostics from this hospitalization (including imaging, microbiology, ancillary and laboratory) are listed below for reference.    Significant Diagnostic Studies: Dg Chest 2 View  02/09/2013   CLINICAL DATA:  Pneumonia. Pleural effusion. Shortness of breath.  EXAM: CHEST  2 VIEW  COMPARISON:  PA and lateral chest 02/07/2013 and single view of the chest 02/09/2013.  FINDINGS: Right effusion has increased since the prior PA and lateral films. Right basilar airspace disease also appears worsened. The left lung is clear. Heart size is normal. No pneumothorax.  IMPRESSION: Increased right effusion and airspace disease over the past 2 days    Electronically Signed   By: Drusilla Kanner M.D.   On: 02/09/2013 15:36   Dg Chest 2 View  02/07/2013   CLINICAL DATA:  Weakness.  EXAM: CHEST  2 VIEW  COMPARISON:  November 26, 2012.  FINDINGS: Cardiomediastinal silhouette appears normal. Left lung is clear. Right internal jugular Port-A-Cath is unchanged in position. There is interval development of right basilar opacity consistent with pneumonia or atelectasis. No pneumothorax is noted.  IMPRESSION: Right lower lobe pneumonia or atelectasis. Followup radiographs are recommended.   Electronically Signed   By: Roque Lias M.D.   On: 02/07/2013 21:24   Ct Chest W Contrast  02/10/2013   CLINICAL DATA:  Shortness of breath and pleural effusion.  EXAM: CT CHEST WITH CONTRAST  TECHNIQUE: Multidetector CT imaging of the chest was performed during intravenous contrast administration.  CONTRAST:  80mL OMNIPAQUE IOHEXOL 300 MG/ML  SOLN  COMPARISON:  Multiple prior chest x-rays.  FINDINGS: The chest wall is unremarkable. No breast masses, supraclavicular or axillary adenopathy. A right-sided Port-A-Cath is noted. The bony thorax is intact. No destructive bone lesions or spinal canal compromise.  The is normal in size. No pericardial effusion. There is a moderate pectus deformity displacing the heart to the left and there is mild mass effect on the right atrium. Three-vessel coronary artery calcifications are noted. Mild enlargement of left atrium. The aorta is normal in caliber. Moderate atherosclerotic calcifications. No dissection. There are scattered mediastinal and hilar lymph nodes which are likely inflammatory. No mass or overt adenopathy.  Examination of the lung parenchyma demonstrates dense right lower lobe airspace consolidation, likely pneumonia with a moderate-sized para pneumonic effusion I do not see any definite pleural enhancement to suggest empyema.  No obvious endobronchial lesion or centrally obstructing mass. The left lung is clear.  The upper  abdomen is unremarkable except for severe atherosclerotic disease involving the aorta.  IMPRESSION: Dense right lower lobe airspace consolidation/pneumonia with a large right-sided parapneumonic effusion. No definite CT findings to suggest empyema.  Inflammatory right hilar and mediastinal lymph nodes.  Advanced atherosclerotic calcifications involving the aorta and coronary arteries.   Electronically Signed   By: Loralie Champagne M.D.   On: 02/10/2013 12:26   Dg Chest Port 1 View  02/11/2013   CLINICAL DATA:  Status post thoracentesis on the right.  EXAM: PORTABLE CHEST - 1 VIEW  COMPARISON:  PA and lateral chest 02/09/2013 and CT chest 02/10/2013.  FINDINGS: Right pleural effusion is decreased after thoracentesis. No pneumothorax is identified. Right lower lobe airspace disease is seen. Small  left pleural effusion with associated basilar atelectasis is noted. Heart size is normal.  IMPRESSION: Decreased left pleural effusion after thoracentesis. No pneumothorax.  Extensive right basilar airspace disease most consistent with pneumonia.  Trace left pleural effusion and mild basilar atelectasis.   Electronically Signed   By: Drusilla Kanner M.D.   On: 02/11/2013 15:29   Dg Chest Port 1 View  02/09/2013   CLINICAL DATA:  Pneumonia, followup  EXAM: PORTABLE CHEST - 1 VIEW  COMPARISON:  Chest x-ray of 02/07/2013  FINDINGS: There has been worsening of opacity at the right lung base consistent with pneumonia and possible small right effusion. There is also now a patchy opacity medially at the left lung base overlying the heart shadow suspicious for a focus of pneumonia as well. No left pleural effusion is seen. A right-sided Port-A-Cath is unchanged in position and cardiomegaly is stable.  IMPRESSION: Worsening of right basilar opacity consistent with pneumonia and effusion. New opacity at the left base also consistent with multifocal pneumonia.   Electronically Signed   By: Dwyane Dee M.D.   On: 02/09/2013 07:55     Microbiology: Recent Results (from the past 240 hour(s))  URINE CULTURE     Status: None   Collection Time    02/07/13  8:37 PM      Result Value Range Status   Specimen Description URINE, RANDOM   Final   Special Requests ADDED 2104   Final   Culture  Setup Time     Final   Value: 02/07/2013 21:47     Performed at [facility]   Colony Count     Final   Value: NO GROWTH     Performed at [facility]   Culture     Final   Value: NO GROWTH     Performed at [facility]   Report Status 02/09/2013 FINAL   Final  CULTURE, BLOOD (ROUTINE X 2)     Status: None   Collection Time    02/08/13  9:06 AM      Result Value Range Status   Specimen Description BLOOD LEFT FOREARM   Final   Special Requests BOTTLES DRAWN AEROBIC AND ANAEROBIC 10CC   Final   Culture  Setup Time     Final   Value: 02/08/2013 13:26     Performed at [facility]   Culture     Final   Value:        BLOOD CULTURE RECEIVED NO GROWTH TO DATE CULTURE WILL BE HELD FOR 5 DAYS BEFORE ISSUING A FINAL NEGATIVE REPORT     Performed at [facility]   Report Status PENDING   Incomplete  CULTURE, BLOOD (ROUTINE X 2)     Status: None   Collection Time    02/08/13  9:19 AM      Result Value Range Status   Specimen Description BLOOD LEFT ARM   Final   Special Requests BOTTLES DRAWN AEROBIC AND ANAEROBIC 10CC   Final   Culture  Setup Time     Final   Value: 02/08/2013 13:26     Performed at [facility]   Culture     Final   Value:        BLOOD CULTURE RECEIVED NO GROWTH TO DATE CULTURE WILL BE HELD FOR 5 DAYS BEFORE ISSUING A FINAL NEGATIVE REPORT     Performed at [facility]   Report Status PENDING   Incomplete  CULTURE,  EXPECTORATED SPUTUM-ASSESSMENT     Status: None   Collection Time    02/10/13 11:30 AM      Result Value Range Status   Specimen Description SPUTUM   Final   Special Requests Immunocompromised   Final   Sputum evaluation     Final   Value:  MICROSCOPIC FINDINGS SUGGEST THAT THIS SPECIMEN IS NOT REPRESENTATIVE OF LOWER RESPIRATORY SECRETIONS. PLEASE RECOLLECT.     CALLED TO J WESSELINK,RN 02/10/13 1330 BY K SCHULTZ   Report Status 02/10/2013 FINAL   Final  BODY FLUID CULTURE     Status: None   Collection Time    02/11/13  2:55 PM      Result Value Range Status   Specimen Description PLEURAL FLUID RIGHT   Final   Special Requests 6. FLUID   Final   Gram Stain     Final   Value: FEW WBC PRESENT,BOTH PMN AND MONONUCLEAR     NO ORGANISMS SEEN     Performed at [facility]   Culture     Final   Value: NO GROWTH 1 DAY     Performed at [facility]   Report Status PENDING   Incomplete  FUNGUS CULTURE W SMEAR     Status: None   Collection Time    02/11/13  2:55 PM      Result Value Range Status   Specimen Description PLEURAL FLUID RIGHT   Final   Special Requests 6.0ML FLUID   Final   Fungal Smear     Final   Value: NO YEAST OR FUNGAL ELEMENTS SEEN     Performed at [facility]   Culture     Final   Value: CULTURE IN PROGRESS FOR FOUR WEEKS     Performed at [facility]   Report Status PENDING   Incomplete     Labs: Basic Metabolic Panel:  Recent Labs Lab 02/07/13 2047 02/08/13 0510 02/09/13 0345 02/10/13 0620  NA 124* 131* 134* 135  K 3.9 4.1 3.8 4.1  CL 89* 96 100 101  CO2 24 28 24 27   GLUCOSE 271* 165* 174* 196*  BUN 19 13 16 15   CREATININE 0.78 0.60 0.62 0.62  CALCIUM 8.8 9.0 8.5 8.4   Liver Function Tests:  Recent Labs Lab 02/07/13 2047 02/11/13 1611  AST 33  --   ALT 34  --   ALKPHOS 55  --   BILITOT 0.9  --   PROT 6.5 5.7*  ALBUMIN 3.2*  --    No results found for this basename: LIPASE, AMYLASE,  in the last 168 hours No results found for this basename: AMMONIA,  in the last 168 hours CBC:  Recent Labs Lab 02/07/13 2047 02/08/13 0510 02/09/13 0345 02/10/13 0620 02/12/13 0555  WBC 13.3* 12.2* 12.7* 10.2 7.8  NEUTROABS 12.3*  --   --   --   --    HGB 8.9* 9.1* 8.4* 8.7* 8.3*  HCT 25.8* 27.2* 24.7* 25.1* 24.5*  MCV 85.1 86.9 85.2 86.0 86.9  PLT 187 183 207 215 189   Cardiac Enzymes: No results found for this basename: CKTOTAL, CKMB, CKMBINDEX, TROPONINI,  in the last 168 hours BNP: BNP (last 3 results) No results found for this basename: PROBNP,  in the last 8760 hours CBG:  Recent Labs Lab 02/11/13 1231 02/11/13 1711 02/11/13 2103 02/12/13 0755 02/12/13 1137  GLUCAP 250* 160* 138* 103* 224*       Signed:  Marissah Vandemark A  [facility] 02/12/2013, 11:51 AM

## 2013-02-12 NOTE — Clinical Documentation Improvement (Signed)
THIS DOCUMENT IS NOT A PERMANENT PART OF THE MEDICAL RECORD  Please update your documentation with the medical record to reflect your response to this query. If you need help knowing how to do this please call [phone].  02/12/13   Dear Dr. Arthor Captain,   Per 10/14 assessment by Registered Dietician: "Pt meets criteria for moderate MALNUTRITION in the context of chronic illness as evidenced by  moderate subcutaneous fat loss and muscle wasting."  If you agree please add to Notes and DC summary to clarify patient's severity of illness and risk of mortality. Thank you.  Poss Conditions? - moderate protein calorie malnutrition - protein calorie malnutrition - other (please specify)  You may use possible, probable, or suspect with inpatient documentation. possible, probable, suspected diagnoses MUST be documented at the time of discharge  Reviewed: additional documentation in the medical record  Thank You,  Beverley Fiedler RN BSN Clinical Documentation Specialist: [phone] Health Information Management [place]

## 2013-02-12 NOTE — Progress Notes (Signed)
Pt maintained greater than 92% on R/A while ambulating in hall.

## 2013-02-12 NOTE — Care Management Note (Addendum)
    Page 1 of 1   02/12/2013     11:32:12 AM   CARE MANAGEMENT NOTE 02/12/2013  Patient:  Meredith Rodriguez, Meredith Rodriguez   Account Number:  [id number]  Date Initiated:  02/08/2013  Documentation initiated by:  Darlyne Russian  Subjective/Objective Assessment:   Patient admitted with RLL pneumonia and UTI.     Action/Plan:   Progression of care and discharge planning  02/09/13 new dx of leukemia, now hypoxic, await pul and palliative care consults.   Anticipated DC Date:  02/12/2013   Anticipated DC Plan:  HOME/SELF CARE  In-house referral  Clinical Social Worker      DC Planning Services  CM consult      Choice offered to / List presented to:             Status of service:  Completed, signed off Medicare Important Message given?   (If response is "NO", the following Medicare IM given date fields will be blank) Date Medicare IM given:   Date Additional Medicare IM given:    Discharge Disposition:  HOME/SELF CARE  Per UR Regulation:  Reviewed for med. necessity/level of care/duration of stay  If discussed at Long Length of Stay Meetings, dates discussed:    Comments:  02/12/13 11:23 Letha Cape RN, BSN [phone] patient is for dc today, per physical therapy eval, patient has no pt needs.  no needs anticipated from NCM. Per CSW , [employer] will be going out to see patient about the mold in her home.

## 2013-02-14 LAB — CULTURE, BLOOD (ROUTINE X 2): Culture: NO GROWTH

## 2013-02-14 LAB — BODY FLUID CULTURE: Culture: NO GROWTH

## 2013-02-15 ENCOUNTER — Other Ambulatory Visit: Payer: Self-pay | Admitting: Emergency Medicine

## 2013-02-15 ENCOUNTER — Other Ambulatory Visit (HOSPITAL_BASED_OUTPATIENT_CLINIC_OR_DEPARTMENT_OTHER): Payer: Medicare Other | Admitting: Lab

## 2013-02-15 ENCOUNTER — Ambulatory Visit (HOSPITAL_BASED_OUTPATIENT_CLINIC_OR_DEPARTMENT_OTHER): Payer: Medicare Other

## 2013-02-15 VITALS — BP 142/48 | HR 77 | Temp 98.0°F | Resp 18

## 2013-02-15 DIAGNOSIS — C9200 Acute myeloblastic leukemia, not having achieved remission: Secondary | ICD-10-CM

## 2013-02-15 DIAGNOSIS — D649 Anemia, unspecified: Secondary | ICD-10-CM

## 2013-02-15 LAB — MANUAL DIFFERENTIAL
ALC: 0.7 10*3/uL — ABNORMAL LOW (ref 0.9–3.3)
ANC (CHCC manual diff): 5 10*3/uL (ref 1.5–6.5)
Band Neutrophils: 26 % — ABNORMAL HIGH (ref 0–10)
Basophil: 0 % (ref 0–2)
Blasts: 4 % — ABNORMAL HIGH (ref 0–0)
LYMPH: 12 % — ABNORMAL LOW (ref 14–49)
Metamyelocytes: 1 % — ABNORMAL HIGH (ref 0–0)
Other Cell: 0 % (ref 0–0)
PROMYELO: 0 % (ref 0–0)
SEG: 55 % (ref 38–77)
Variant Lymph: 0 % (ref 0–0)

## 2013-02-15 LAB — CBC WITH DIFFERENTIAL/PLATELET
HCT: 24.2 % — ABNORMAL LOW (ref 34.8–46.6)
Platelets: 238 10*3/uL (ref 145–400)
RBC: 2.75 10*6/uL — ABNORMAL LOW (ref 3.70–5.45)
WBC: 6.1 10*3/uL (ref 3.9–10.3)

## 2013-02-15 LAB — PREPARE RBC (CROSSMATCH)

## 2013-02-15 LAB — HOLD TUBE, BLOOD BANK

## 2013-02-15 MED ORDER — ACETAMINOPHEN 325 MG PO TABS
ORAL_TABLET | ORAL | Status: AC
  Filled 2013-02-15: qty 2

## 2013-02-15 MED ORDER — SODIUM CHLORIDE 0.9 % IV SOLN
250.0000 mL | Freq: Once | INTRAVENOUS | Status: AC
  Administered 2013-02-15: 250 mL via INTRAVENOUS

## 2013-02-15 MED ORDER — DIPHENHYDRAMINE HCL 25 MG PO CAPS
ORAL_CAPSULE | ORAL | Status: AC
  Filled 2013-02-15: qty 1

## 2013-02-15 MED ORDER — DIPHENHYDRAMINE HCL 25 MG PO CAPS
25.0000 mg | ORAL_CAPSULE | Freq: Once | ORAL | Status: AC
  Administered 2013-02-15: 25 mg via ORAL

## 2013-02-15 MED ORDER — SODIUM CHLORIDE 0.9 % IJ SOLN
10.0000 mL | INTRAMUSCULAR | Status: DC | PRN
  Filled 2013-02-15: qty 10

## 2013-02-15 MED ORDER — HEPARIN SOD (PORK) LOCK FLUSH 100 UNIT/ML IV SOLN
500.0000 [IU] | Freq: Every day | INTRAVENOUS | Status: DC | PRN
  Filled 2013-02-15: qty 5

## 2013-02-15 MED ORDER — ACETAMINOPHEN 325 MG PO TABS
650.0000 mg | ORAL_TABLET | Freq: Once | ORAL | Status: AC
  Administered 2013-02-15: 650 mg via ORAL

## 2013-02-15 NOTE — Patient Instructions (Signed)
Blood Products Information This is information about transfusions of blood products. All blood that is to be transfused is tested for blood type, compatibility with the recipient, and for infections. Except in emergencies, giving a transfusion requires a written consent. Blood transfusions are often given as packed red blood cells. This means the other parts of the blood have been taken out. Blood may be needed to treat severe anemia or bleeding. Other blood products include plasma, platelets, immune globulin, and cryoprecipitate. Blood for transfusion is mostly donated by volunteers. The blood donors are carefully screened for risk factors that could cause disease. Donors are all tested for infections that could be transmitted by blood. The blood product supply today is the safest it has ever been. Some risks do remain.  A minor reaction with fever, chills, or rash happens in about 1% of blood product transfusions.  Life-threatening reactions occur in less than 1 in a million transfusions.  Infection with germs (bacteria), viruses or parasites like malaria can still happen. The risk is very low.  Hepatitis B occurs in about 1 case in 150,000 transfusions.  Hepatitis C is seen once in 500,000.  HIV is transmitted less than once every million transfusions. When you receive a transfusion of packed red blood cells, your blood is tested for blood group and Rh type. Your blood is also screened for antibodies that could cause a serious reaction. A cross-match test is done to make sure the blood is safe to give.  Talk with your caregiver if you have any concerns about receiving a transfusion of blood products. Make sure your questions are answered. Transfusions are not given if your caregiver feels the risk is greater than the need. Document Released: 04/15/2005 Document Revised: 07/08/2011 Document Reviewed: 10/03/2006 Exit[employer] Patient Information 2014 [employer].  

## 2013-02-16 ENCOUNTER — Telehealth: Payer: Self-pay | Admitting: *Deleted

## 2013-02-16 LAB — TYPE AND SCREEN
Antibody Screen: NEGATIVE
Donor AG Type: NEGATIVE
Donor AG Type: NEGATIVE
Unit division: 0

## 2013-02-16 NOTE — Telephone Encounter (Signed)
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 Follow- up with primary care physician within one week  Discharge condition:stable  I talked with patient and she states she feels very weak.  She stated she had 2 prbc yesterday and she hopes that will give her some strength.  She states she has no appetite , but is trying to eat.  Her blood sugars are running a  Little over 150 every day.   She continues to have a slight cough but mucinex helps that.  She continues on her Levaquin that she was discharged home from the hospital and she is compliant with her medication regimen  Patient has post hospital follow- up with Dr Caryl Never on 02-17-13 at 415pm

## 2013-02-17 ENCOUNTER — Ambulatory Visit (INDEPENDENT_AMBULATORY_CARE_PROVIDER_SITE_OTHER): Payer: Medicare Other | Admitting: Family Medicine

## 2013-02-17 ENCOUNTER — Encounter: Payer: Self-pay | Admitting: Family Medicine

## 2013-02-17 VITALS — BP 136/70 | HR 81 | Temp 97.9°F | Wt 108.0 lb

## 2013-02-17 DIAGNOSIS — J189 Pneumonia, unspecified organism: Secondary | ICD-10-CM

## 2013-02-17 DIAGNOSIS — D469 Myelodysplastic syndrome, unspecified: Secondary | ICD-10-CM

## 2013-02-17 DIAGNOSIS — E1165 Type 2 diabetes mellitus with hyperglycemia: Secondary | ICD-10-CM

## 2013-02-17 DIAGNOSIS — D61818 Other pancytopenia: Secondary | ICD-10-CM

## 2013-02-17 NOTE — Patient Instructions (Addendum)
Hold Humalog if premeal sugars are < 100 Follow up promptly for any recurrent fever after finishing antibiotic.

## 2013-02-17 NOTE — Progress Notes (Signed)
Subjective:    Patient ID: Meredith Rodriguez, female    DOB: Oct 27, 1925, 79 y.o.   MRN: [id number]  HPI Hospital followup. Patient has chronic problems including history of type 2 diabetes, pancytopenia, myelodysplastic syndrome, hypertension. She is followed closely by hematology and gets periodic transfusions. She was admitted on 02/07/2013 and discharged on 02/12/2013. She presented with fever of 102 and increased weakness. She also had some blood-tinged urine. She had nonproductive cough one week prior to admission. Chest x-ray in emergency department revealed probable pneumonia and urine suggesting possible UTI. Subsequent urine culture was negative. She's had previous Pneumovax. She's had influenza vaccine.  Patient was initially treated with Levaquin but not showing much progress and eventually switched to vancomycin and Zosyn. She was treated as health care associated pneumonia because of her regular transfusions. Blood cultures were negative. She had thoracentesis 900 mL and cultures thus far negative. Discharged on Levaquin 750 mg daily. She is improved greatly. She was initially hypoxic and currently breathing well on room air.  She had hyponatremia on admission with sodium 125 which promptly improved with IV hydration. Her appetite is still somewhat poor. Good fluid intake.  Type 2 diabetes with recent worsening control. Recent hemoglobin A1c 8.8%. Discharged on Lantus 10 units once daily and Humalog 4 units twice a day with meals. No recent hypoglycemia. She states her fasting blood sugars and free female blood sugars vary generally between 80 and 150.  Past Medical History  Diagnosis Date  . Hypertension   . Diabetes mellitus   . Hyperkalemia   . IBS (irritable bowel syndrome)   . Hypercholesterolemia   . Cervical radiculopathy   . Anemia   . Cancer     leukemia  . Leukemia    Past Surgical History  Procedure Laterality Date  . Varicose veins      surgey 1959  . Tonsilectomy,  adenoidectomy, bilateral myringotomy and tubes    . Esophagogastroduodenoscopy  04/01/2011    Procedure: ESOPHAGOGASTRODUODENOSCOPY (EGD);  Surgeon: Barrie Folk, MD;  Location: [facility] ENDOSCOPY;  Service: Endoscopy;  Laterality: N/A;    reports that she has never smoked. She does not have any smokeless tobacco history on file. She reports that she does not drink alcohol or use illicit drugs. family history includes Cancer in her daughter, sister, sister, and sister; Diabetes in her sister; Hypertension in her father; Stroke in her mother. Allergies  Allergen Reactions  . Amoxicillin [Amoxicillin]     Upset stomach  . Cephalexin Nausea Only  . Metformin And Related     Gi problems  . Phenergan [Promethazine Hcl] Other (See Comments)    Unknown   . Procardia [Nifedipine]     dizziness      Review of Systems  Constitutional: Positive for fatigue. Negative for fever and chills.  Respiratory: Positive for shortness of breath. Negative for cough.   Gastrointestinal: Positive for constipation. Negative for nausea, vomiting, abdominal pain and diarrhea.  Genitourinary: Negative for dysuria.  Neurological: Positive for weakness (generalized but near baseline). Negative for dizziness, syncope and headaches.  Psychiatric/Behavioral: Negative for confusion.       Objective:   Physical Exam  Constitutional: She appears well-developed and well-nourished.  HENT:  Right Ear: External ear normal.  Left Ear: External ear normal.  Mouth/Throat: Oropharynx is clear and moist.  Neck: Neck supple.  Cardiovascular: Normal rate.   Pulmonary/Chest: Effort normal and breath sounds normal. No respiratory distress. She has no wheezes. She has no rales.  Musculoskeletal: She  exhibits no edema.  Lymphadenopathy:    She has no cervical adenopathy.  Neurological: She is alert.          Assessment & Plan:  #1 recent possible healthcare associated pneumonia right lower lobe. Clinically improved on  Levaquin. Finish out current antibiotics. She currently has room air oxygen 97% and no fever it is near baseline #2 history of recent hyponatremia which was corrected at discharge. #3 type 2 diabetes with recent poor control. We have recently added back low-dose Humalog which she is taking twice daily with meals and remain on Lantus 10 units once daily. She will hold Humalog for prenatal blood sugars less than 100 We are not aiming for extremely tight control because of her age and multiple comorbidities #4 history of acute myeloid leukemia followed by hematology/oncology.  She had recent transfusion this past Monday and feels somewhat better symptomatically since then

## 2013-02-22 ENCOUNTER — Encounter: Payer: Self-pay | Admitting: Oncology

## 2013-02-22 ENCOUNTER — Ambulatory Visit (HOSPITAL_BASED_OUTPATIENT_CLINIC_OR_DEPARTMENT_OTHER): Payer: Medicare Other | Admitting: Oncology

## 2013-02-22 ENCOUNTER — Other Ambulatory Visit (HOSPITAL_BASED_OUTPATIENT_CLINIC_OR_DEPARTMENT_OTHER): Payer: Medicare Other | Admitting: Lab

## 2013-02-22 ENCOUNTER — Telehealth: Payer: Self-pay | Admitting: *Deleted

## 2013-02-22 VITALS — BP 118/59 | HR 88 | Temp 97.6°F | Resp 20 | Ht 66.0 in | Wt 106.3 lb

## 2013-02-22 DIAGNOSIS — D4620 Refractory anemia with excess of blasts, unspecified: Secondary | ICD-10-CM

## 2013-02-22 DIAGNOSIS — C9200 Acute myeloblastic leukemia, not having achieved remission: Secondary | ICD-10-CM

## 2013-02-22 DIAGNOSIS — D649 Anemia, unspecified: Secondary | ICD-10-CM

## 2013-02-22 DIAGNOSIS — D469 Myelodysplastic syndrome, unspecified: Secondary | ICD-10-CM

## 2013-02-22 LAB — CBC WITH DIFFERENTIAL/PLATELET
BASO%: 0.2 % (ref 0.0–2.0)
HCT: 34.9 % (ref 34.8–46.6)
LYMPH%: 17.7 % (ref 14.0–49.7)
MCH: 29.3 pg (ref 25.1–34.0)
MCHC: 33 g/dL (ref 31.5–36.0)
MCV: 88.8 fL (ref 79.5–101.0)
MONO#: 0.1 10*3/uL (ref 0.1–0.9)
MONO%: 1 % (ref 0.0–14.0)
NEUT%: 79.9 % — ABNORMAL HIGH (ref 38.4–76.8)
Platelets: 137 10*3/uL — ABNORMAL LOW (ref 145–400)
RBC: 3.93 10*6/uL (ref 3.70–5.45)
WBC: 5 10*3/uL (ref 3.9–10.3)
nRBC: 0 % (ref 0–0)

## 2013-02-22 NOTE — Progress Notes (Signed)
OFFICE PROGRESS NOTE  CC  [name], MD [address] [place] [address]  DIAGNOSIS: 79 year old female with severe and profound anemia likely due to a primary bone marrow problem. 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  #2 patient has been on Procrit every 2 weeks unfortunately this has not effective and we will discontinue this.  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  #4 Patient received 3 cycles of Decitabine chemotherapy, her third cycle was complicated by febrile neutropenia and pneumonia causing hospitalization.  Since then we have not given any more chemotherapy, and have been treating her supportively ever since.   CURRENT THERAPY:Supportive care  INTERVAL HISTORY: Meredith Rodriguez 79 y.o. female returns for follow up today for her AML./MDS . Recently she was hospitalized for pneumonia and a urinary tract infection. She also had a thoracentesis performed which showed no evidence of a malignancy. Overall she feels well today. She denies any fevers chills night sweats headaches no shortness of breath no chest pains no weakness or fatigue. She is taking Exjade. Her last iron studies were performed in August 2014. We will plan on doing studies again in December. Remainder of the 10 point review of systems is negative.  MEDICAL HISTORY: Past Medical History  Diagnosis Date  . Hypertension   . Diabetes mellitus   . Hyperkalemia   . IBS (irritable bowel syndrome)   . Hypercholesterolemia   . Cervical radiculopathy   . Anemia   . Cancer     leukemia  . Leukemia     ALLERGIES:  is allergic to amoxicillin; cephalexin; metformin and related; phenergan; and procardia.  MEDICATIONS:  Current Outpatient Prescriptions  Medication Sig Dispense Refill  . amLODipine (NORVASC) 10 MG tablet Take  1 tablet (10 mg total) by mouth daily.  90 tablet  3  . aspirin 81 MG tablet Take 81 mg by mouth daily.      . cholecalciferol (VITAMIN D) 1000 UNITS tablet Take 1,000 Units by mouth daily.      Marland Kitchen deferasirox (EXJADE) 500 MG disintegrating tablet Take 1 tablet (500 mg total) by mouth daily before breakfast.  30 tablet  0  . guaiFENesin (MUCINEX) 600 MG 12 hr tablet Take 2 tablets (1,200 mg total) by mouth 2 (two) times daily.  30 tablet  0  . insulin glargine (LANTUS SOLOSTAR) 100 UNIT/ML injection Inject 0.1 mLs (10 Units total) into the skin daily.  5 pen  1  . insulin lispro (HUMALOG KWIKPEN) 100 UNIT/ML SOPN Inject 4 Units into the skin 2 (two) times daily with a meal. Inject 5 units twice daily into the skin with meals. Lunch and dinner      . levofloxacin (LEVAQUIN) 750 MG tablet Take 1 tablet (750 mg total) by mouth daily.  10 tablet  0  . lidocaine-prilocaine (EMLA) cream Apply topically as needed.  30 g  4  . metoprolol (LOPRESSOR) 50 MG tablet Take 1 tablet (50 mg total) by mouth 2 (two) times daily.  180 tablet  3  . Omega-3 Fatty Acids (FISH OIL) 1000 MG CAPS Take 1,000 mg by mouth daily.       . simvastatin (ZOCOR) 5 MG tablet Take 5 mg by mouth at bedtime.      . [DISCONTINUED] glimepiride (AMARYL) 2 MG tablet Take 1 tablet (2 mg total) by mouth every  morning.  30 tablet  11   Current Facility-Administered Medications  Medication Dose Route Frequency Provider Last Rate Last Dose  . TDaP (BOOSTRIX) injection 0.5 mL  0.5 mL Intramuscular Once [name], MD        SURGICAL HISTORY:  Past Surgical History  Procedure Laterality Date  . Varicose veins      surgey 1959  . Tonsilectomy, adenoidectomy, bilateral myringotomy and tubes    . Esophagogastroduodenoscopy  04/01/2011    Procedure: ESOPHAGOGASTRODUODENOSCOPY (EGD);  Surgeon: Barrie Folk, MD;  Location: [facility] ENDOSCOPY;  Service: Endoscopy;  Laterality: N/A;    REVIEW OF SYSTEMS:   A 10 point review of systems was  conducted and is otherwise negative except for what is noted above.      PHYSICAL EXAMINATION: BP 118/59  Pulse 88  Temp(Src) 97.6 F (36.4 C) (Oral)  Resp 20  Ht 5\' 6"  (1.676 m)  Wt 106 lb 4.8 oz (48.217 kg)  BMI 17.17 kg/m2 General: Patient is a well appearing female in no acute distress HEENT: PERRLA, sclerae anicteric no conjunctival pallor, MMM no erythema or exudate Neck: supple, no lymphadenopathy Lungs: clear to auscultation bilaterally, no wheezes, rhonchi, or rales Cardiovascular: regular rate rhythm, S1, S2, no murmurs, rubs or gallops Abdomen: Soft, non-tender, non-distended, normoactive bowel sounds, no HSM Extremities: warm and well perfused, no clubbing, cyanosis, or edema Skin: No rashes.    Neuro: Non-focal ECOG PERFORMANCE STATUS: 1  LABORATORY DATA: Lab Results  Component Value Date   WBC 5.0 02/22/2013   HGB 11.5* 02/22/2013   HCT 34.9 02/22/2013   MCV 88.8 02/22/2013   PLT 137 Large & giant platelets* 02/22/2013      Chemistry      Component Value Date/Time   NA 135 02/10/2013 0620   NA 137 01/25/2013 0834   K 4.1 02/10/2013 0620   K 4.5 01/25/2013 0834   CL 101 02/10/2013 0620   CL 99 06/01/2012 1028   CO2 27 02/10/2013 0620   CO2 29 01/25/2013 0834   BUN 15 02/10/2013 0620   BUN 13.5 01/25/2013 0834   CREATININE 0.62 02/10/2013 0620   CREATININE 0.8 01/25/2013 0834   CREATININE 0.69 07/18/2010 1202   GLU 215* 02/26/2012 1255      Component Value Date/Time   CALCIUM 8.4 02/10/2013 0620   CALCIUM 9.4 01/25/2013 0834   ALKPHOS 55 02/07/2013 2047   ALKPHOS 58 01/25/2013 0834   AST 33 02/07/2013 2047   AST 24 01/25/2013 0834   ALT 34 02/07/2013 2047   ALT 30 01/25/2013 0834   BILITOT 0.9 02/07/2013 2047   BILITOT 1.14 01/25/2013 0834       RADIOGRAPHIC STUDIES:  No results found.  ASSESSMENT: 79 year old female with:  1.  severe anemia due to primary refractory anemia.  She later developed peripheral blasts on the smear flow cytometry  demonstrating acute leukemia likely myeloid.  #3 Patient was started on Decitabine chemotherapy.  She received three cycles of this, and then had an episode of febrile neutropenia resulting in pneumonia and hospitalization.  Her chemotherapy has been discontinued due to her complication of febrile neutropenia and pneumonia.  Much more chemotherapy could take her life, therefore, we will continue with supportive care only.  She receives blood transfusions every 3-4 weeks.    #3 Iron overload:  Ferritin measured on 11/30/12 was 3632, iron 207.  She was started on Exjade on 8/29.  She will continue this indefinitely.    PLAN:  #1 patient will  not require any blood transfusions today since her hemoglobin is 11.5  #2 she will be seen back on 03/22/2013 4 office visit and a CBC and a transfusion if needed depending on where her hemoglobin is.  #3 patient knows to call us with any problems should they arise.  All questions were answered. The patient knows to call the clinic with any problems, questions or concerns. We can certainly see the patient much sooner if necessary.  I spent 25 minutes counseling the patient face to face. The total time spent in the appointment was 30 minutes.   Drue Second, MD Medical/Oncology [facility] [phone] (beeper) [phone] (Office)

## 2013-02-22 NOTE — Patient Instructions (Signed)
Doing well, no transfusion today  Continue the Exjade  We will see you back on 11/24 , please call if you think you need to see [place] sooner

## 2013-02-22 NOTE — Telephone Encounter (Signed)
appts made and printed...td 

## 2013-03-01 ENCOUNTER — Other Ambulatory Visit: Payer: Self-pay | Admitting: Emergency Medicine

## 2013-03-01 ENCOUNTER — Other Ambulatory Visit (HOSPITAL_BASED_OUTPATIENT_CLINIC_OR_DEPARTMENT_OTHER): Payer: Medicare Other | Admitting: Lab

## 2013-03-01 DIAGNOSIS — C9200 Acute myeloblastic leukemia, not having achieved remission: Secondary | ICD-10-CM

## 2013-03-01 LAB — CBC WITH DIFFERENTIAL/PLATELET
Basophils Absolute: 0 10*3/uL (ref 0.0–0.1)
EOS%: 1 % (ref 0.0–7.0)
Eosinophils Absolute: 0.1 10*3/uL (ref 0.0–0.5)
HGB: 10.4 g/dL — ABNORMAL LOW (ref 11.6–15.9)
MCH: 28.8 pg (ref 25.1–34.0)
MONO#: 0.1 10*3/uL (ref 0.1–0.9)
MONO%: 1.5 % (ref 0.0–14.0)
NEUT#: 4.2 10*3/uL (ref 1.5–6.5)
NEUT%: 80.5 % — ABNORMAL HIGH (ref 38.4–76.8)
Platelets: 179 10*3/uL (ref 145–400)
RBC: 3.61 10*6/uL — ABNORMAL LOW (ref 3.70–5.45)
RDW: 14.8 % — ABNORMAL HIGH (ref 11.2–14.5)
WBC: 5.2 10*3/uL (ref 3.9–10.3)
nRBC: 0 % (ref 0–0)

## 2013-03-01 LAB — HOLD TUBE, BLOOD BANK

## 2013-03-01 MED ORDER — DEFERASIROX 500 MG PO TBSO: 10.0000 mg/kg | ORAL_TABLET | Freq: Every day | ORAL | Status: DC

## 2013-03-08 ENCOUNTER — Other Ambulatory Visit (HOSPITAL_BASED_OUTPATIENT_CLINIC_OR_DEPARTMENT_OTHER): Payer: Medicare Other | Admitting: Lab

## 2013-03-08 DIAGNOSIS — C9200 Acute myeloblastic leukemia, not having achieved remission: Secondary | ICD-10-CM

## 2013-03-08 LAB — CBC WITH DIFFERENTIAL/PLATELET
BASO%: 0 % (ref 0.0–2.0)
Eosinophils Absolute: 0.1 10*3/uL (ref 0.0–0.5)
HGB: 8.7 g/dL — ABNORMAL LOW (ref 11.6–15.9)
MCH: 28.5 pg (ref 25.1–34.0)
MONO#: 0.1 10*3/uL (ref 0.1–0.9)
MONO%: 1 % (ref 0.0–14.0)
NEUT#: 4.1 10*3/uL (ref 1.5–6.5)
RBC: 3.05 10*6/uL — ABNORMAL LOW (ref 3.70–5.45)
RDW: 14.8 % — ABNORMAL HIGH (ref 11.2–14.5)
WBC: 4.9 10*3/uL (ref 3.9–10.3)
lymph#: 0.6 10*3/uL — ABNORMAL LOW (ref 0.9–3.3)
nRBC: 0 % (ref 0–0)

## 2013-03-08 LAB — HOLD TUBE, BLOOD BANK

## 2013-03-09 ENCOUNTER — Other Ambulatory Visit: Payer: Self-pay | Admitting: *Deleted

## 2013-03-09 ENCOUNTER — Telehealth: Payer: Self-pay | Admitting: *Deleted

## 2013-03-09 DIAGNOSIS — D649 Anemia, unspecified: Secondary | ICD-10-CM

## 2013-03-09 NOTE — Telephone Encounter (Signed)
Pt called with request for blood transfusion. Pt declined transfusion 11/10 due to she was brought by someone who could not stay and wait for her. POF to scheduler for pt to receive transfusion Friday S/w scheduler pt lab appt Friday 1130, infusion at 1230. Pt aware.

## 2013-03-10 ENCOUNTER — Ambulatory Visit (HOSPITAL_COMMUNITY)
Admission: RE | Admit: 2013-03-10 | Discharge: 2013-03-10 | Disposition: A | Discharge status: Home / Self Care | Payer: Medicare Other | Source: Ambulatory Visit | Attending: Oncology | Admitting: Oncology

## 2013-03-10 DIAGNOSIS — D649 Anemia, unspecified: Secondary | ICD-10-CM | POA: Insufficient documentation

## 2013-03-10 DIAGNOSIS — D469 Myelodysplastic syndrome, unspecified: Secondary | ICD-10-CM | POA: Insufficient documentation

## 2013-03-10 LAB — FUNGUS CULTURE W SMEAR

## 2013-03-12 ENCOUNTER — Ambulatory Visit (HOSPITAL_BASED_OUTPATIENT_CLINIC_OR_DEPARTMENT_OTHER): Payer: Medicare Other

## 2013-03-12 ENCOUNTER — Other Ambulatory Visit: Payer: Self-pay | Admitting: *Deleted

## 2013-03-12 ENCOUNTER — Other Ambulatory Visit: Payer: Self-pay | Admitting: Medical Oncology

## 2013-03-12 ENCOUNTER — Ambulatory Visit: Payer: Medicare Other | Admitting: Lab

## 2013-03-12 VITALS — BP 160/83 | HR 74 | Temp 98.6°F | Resp 18

## 2013-03-12 DIAGNOSIS — D649 Anemia, unspecified: Secondary | ICD-10-CM

## 2013-03-12 DIAGNOSIS — C9200 Acute myeloblastic leukemia, not having achieved remission: Secondary | ICD-10-CM

## 2013-03-12 LAB — PREPARE RBC (CROSSMATCH)

## 2013-03-12 MED ORDER — HEPARIN SOD (PORK) LOCK FLUSH 100 UNIT/ML IV SOLN
250.0000 [IU] | INTRAVENOUS | Status: DC | PRN
  Filled 2013-03-12: qty 5

## 2013-03-12 MED ORDER — ACETAMINOPHEN 325 MG PO TABS
ORAL_TABLET | ORAL | Status: AC
  Filled 2013-03-12: qty 2

## 2013-03-12 MED ORDER — HEPARIN SOD (PORK) LOCK FLUSH 100 UNIT/ML IV SOLN
500.0000 [IU] | Freq: Every day | INTRAVENOUS | Status: DC | PRN
  Filled 2013-03-12: qty 5

## 2013-03-12 MED ORDER — SODIUM CHLORIDE 0.9 % IV SOLN
250.0000 mL | Freq: Once | INTRAVENOUS | Status: AC
  Administered 2013-03-12: 250 mL via INTRAVENOUS

## 2013-03-12 MED ORDER — DIPHENHYDRAMINE HCL 25 MG PO CAPS
ORAL_CAPSULE | ORAL | Status: AC
  Filled 2013-03-12: qty 1

## 2013-03-12 MED ORDER — SODIUM CHLORIDE 0.9 % IJ SOLN
10.0000 mL | INTRAMUSCULAR | Status: DC | PRN
  Filled 2013-03-12: qty 10

## 2013-03-12 MED ORDER — ACETAMINOPHEN 325 MG PO TABS
650.0000 mg | ORAL_TABLET | Freq: Once | ORAL | Status: AC
  Administered 2013-03-12: 650 mg via ORAL

## 2013-03-12 MED ORDER — DIPHENHYDRAMINE HCL 25 MG PO CAPS
25.0000 mg | ORAL_CAPSULE | Freq: Once | ORAL | Status: AC
  Administered 2013-03-12: 25 mg via ORAL

## 2013-03-12 NOTE — Patient Instructions (Signed)
Blood Transfusion Information WHAT IS A BLOOD TRANSFUSION? A transfusion is the replacement of blood or some of its parts. Blood is made up of multiple cells which provide different functions.  Red blood cells carry oxygen and are used for blood loss replacement.  White blood cells fight against infection.  Platelets control bleeding.  Plasma helps clot blood.  Other blood products are available for specialized needs, such as hemophilia or other clotting disorders. BEFORE THE TRANSFUSION  Who gives blood for transfusions?   You may be able to donate blood to be used at a later date on yourself (autologous donation).  Relatives can be asked to donate blood. This is generally not any safer than if you have received blood from a stranger. The same precautions are taken to ensure safety when a relative's blood is donated.  Healthy volunteers who are fully evaluated to make sure their blood is safe. This is blood bank blood. Transfusion therapy is the safest it has ever been in the practice of medicine. Before blood is taken from a donor, a complete history is taken to make sure that person has no history of diseases nor engages in risky social behavior (examples are intravenous drug use or sexual activity with multiple partners). The donor's travel history is screened to minimize risk of transmitting infections, such as malaria. The donated blood is tested for signs of infectious diseases, such as HIV and hepatitis. The blood is then tested to be sure it is compatible with you in order to minimize the chance of a transfusion reaction. If you or a relative donates blood, this is often done in anticipation of surgery and is not appropriate for emergency situations. It takes many days to process the donated blood. RISKS AND COMPLICATIONS Although transfusion therapy is very safe and saves many lives, the main dangers of transfusion include:   Getting an infectious disease.  Developing a  transfusion reaction. This is an allergic reaction to something in the blood you were given. Every precaution is taken to prevent this. The decision to have a blood transfusion has been considered carefully by your caregiver before blood is given. Blood is not given unless the benefits outweigh the risks. AFTER THE TRANSFUSION  Right after receiving a blood transfusion, you will usually feel much better and more energetic. This is especially true if your red blood cells have gotten low (anemic). The transfusion raises the level of the red blood cells which carry oxygen, and this usually causes an energy increase.  The nurse administering the transfusion will monitor you carefully for complications. HOME CARE INSTRUCTIONS  No special instructions are needed after a transfusion. You may find your energy is better. Speak with your caregiver about any limitations on activity for underlying diseases you may have. SEEK MEDICAL CARE IF:   Your condition is not improving after your transfusion.  You develop redness or irritation at the intravenous (IV) site. SEEK IMMEDIATE MEDICAL CARE IF:  Any of the following symptoms occur over the next 12 hours:  Shaking chills.  You have a temperature by mouth above 102 F (38.9 C), not controlled by medicine.  Chest, back, or muscle pain.  People around you feel you are not acting correctly or are confused.  Shortness of breath or difficulty breathing.  Dizziness and fainting.  You get a rash or develop hives.  You have a decrease in urine output.  Your urine turns a dark color or changes to pink, red, or brown. Any of the following   symptoms occur over the next 10 days:  You have a temperature by mouth above 102 F (38.9 C), not controlled by medicine.  Shortness of breath.  Weakness after normal activity.  The white part of the eye turns yellow (jaundice).  You have a decrease in the amount of urine or are urinating less often.  Your  urine turns a dark color or changes to pink, red, or brown. Document Released: 04/12/2000 Document Revised: 07/08/2011 Document Reviewed: 11/30/2007 [employer] Patient Information 2014 [employer].  

## 2013-03-13 DIAGNOSIS — E119 Type 2 diabetes mellitus without complications: Secondary | ICD-10-CM

## 2013-03-13 DIAGNOSIS — J189 Pneumonia, unspecified organism: Secondary | ICD-10-CM

## 2013-03-13 DIAGNOSIS — D469 Myelodysplastic syndrome, unspecified: Secondary | ICD-10-CM

## 2013-03-13 DIAGNOSIS — D61818 Other pancytopenia: Secondary | ICD-10-CM

## 2013-03-13 LAB — TYPE AND SCREEN
Donor AG Type: NEGATIVE
Unit division: 0

## 2013-03-15 ENCOUNTER — Other Ambulatory Visit (HOSPITAL_BASED_OUTPATIENT_CLINIC_OR_DEPARTMENT_OTHER): Payer: Medicare Other | Admitting: Lab

## 2013-03-15 DIAGNOSIS — C9200 Acute myeloblastic leukemia, not having achieved remission: Secondary | ICD-10-CM

## 2013-03-15 LAB — CBC WITH DIFFERENTIAL/PLATELET
Basophils Absolute: 0 10*3/uL (ref 0.0–0.1)
EOS%: 2.9 % (ref 0.0–7.0)
HCT: 35.6 % (ref 34.8–46.6)
HGB: 11.8 g/dL (ref 11.6–15.9)
LYMPH%: 20 % (ref 14.0–49.7)
MCH: 29.5 pg (ref 25.1–34.0)
MCV: 89 fL (ref 79.5–101.0)
MONO%: 0.7 % (ref 0.0–14.0)
NEUT%: 76.4 % (ref 38.4–76.8)
Platelets: 144 10*3/uL — ABNORMAL LOW (ref 145–400)
lymph#: 0.8 10*3/uL — ABNORMAL LOW (ref 0.9–3.3)
nRBC: 0 % (ref 0–0)

## 2013-03-22 ENCOUNTER — Ambulatory Visit (HOSPITAL_BASED_OUTPATIENT_CLINIC_OR_DEPARTMENT_OTHER): Payer: Medicare Other | Admitting: Family

## 2013-03-22 ENCOUNTER — Other Ambulatory Visit (HOSPITAL_BASED_OUTPATIENT_CLINIC_OR_DEPARTMENT_OTHER): Payer: Medicare Other | Admitting: Lab

## 2013-03-22 ENCOUNTER — Encounter: Payer: Self-pay | Admitting: Family

## 2013-03-22 ENCOUNTER — Telehealth: Payer: Self-pay | Admitting: Oncology

## 2013-03-22 VITALS — BP 134/69 | HR 96 | Temp 98.6°F | Resp 18 | Ht 66.0 in | Wt 106.7 lb

## 2013-03-22 DIAGNOSIS — D649 Anemia, unspecified: Secondary | ICD-10-CM

## 2013-03-22 DIAGNOSIS — C9200 Acute myeloblastic leukemia, not having achieved remission: Secondary | ICD-10-CM

## 2013-03-22 DIAGNOSIS — D469 Myelodysplastic syndrome, unspecified: Secondary | ICD-10-CM

## 2013-03-22 LAB — CBC WITH DIFFERENTIAL/PLATELET
BASO%: 0.3 % (ref 0.0–2.0)
Basophils Absolute: 0 10*3/uL (ref 0.0–0.1)
HCT: 35.7 % (ref 34.8–46.6)
HGB: 11.6 g/dL (ref 11.6–15.9)
MCH: 29.6 pg (ref 25.1–34.0)
MCV: 91.1 fL (ref 79.5–101.0)
MONO#: 0 10*3/uL — ABNORMAL LOW (ref 0.1–0.9)
MONO%: 0.5 % (ref 0.0–14.0)
NEUT%: 74.6 % (ref 38.4–76.8)
Platelets: 139 10*3/uL — ABNORMAL LOW (ref 145–400)
RBC: 3.92 10*6/uL (ref 3.70–5.45)
WBC: 3.8 10*3/uL — ABNORMAL LOW (ref 3.9–10.3)
lymph#: 0.8 10*3/uL — ABNORMAL LOW (ref 0.9–3.3)

## 2013-03-22 LAB — HOLD TUBE, BLOOD BANK

## 2013-03-22 NOTE — Patient Instructions (Signed)
Please contact us at [phone] if you have any questions or concerns.  Please continue to do well and enjoy life!!!  Get plenty of rest, drink plenty of water, exercise daily (walking), eat a balanced diet.  Take vitamin D3 1000 IUs daily.   Complete monthly self-breast examinations.  Have a clinical breast exam by a physician every year.  Results for orders placed in visit on 03/22/13 (from the past 24 hour(s))  CBC WITH DIFFERENTIAL     Status: Abnormal   Collection Time    03/22/13  8:31 AM      Result Value Range   WBC 3.8 (*) 3.9 - 10.3 10e3/uL   NEUT# 2.8  1.5 - 6.5 10e3/uL   HGB 11.6  11.6 - 15.9 g/dL   HCT 40.9  81.1 - 91.4 %   Platelets 139 (*) 145 - 400 10e3/uL   MCV 91.1  79.5 - 101.0 fL   MCH 29.6  25.1 - 34.0 pg   MCHC 32.5  31.5 - 36.0 g/dL   RBC 7.82  9.56 - 2.13 10e6/uL   RDW 14.9 (*) 11.2 - 14.5 %   lymph# 0.8 (*) 0.9 - 3.3 10e3/uL   MONO# 0.0 (*) 0.1 - 0.9 10e3/uL   Eosinophils Absolute 0.1  0.0 - 0.5 10e3/uL   Basophils Absolute 0.0  0.0 - 0.1 10e3/uL   NEUT% 74.6  38.4 - 76.8 %   LYMPH% 21.2  14.0 - 49.7 %   MONO% 0.5  0.0 - 14.0 %   EOS% 3.4  0.0 - 7.0 %   BASO% 0.3  0.0 - 2.0 %   nRBC 0  0 - 0 %   Narrative:    Performed At:  [facility]               [address]               [place], [place] [address]

## 2013-03-22 NOTE — Progress Notes (Signed)
[facility]  [phone] [phone]  OFFICE PROGRESS NOTE   ID: Meredith Rodriguez   DOB: 1926-01-10  MR#: [id number]  UUV#:[id number]   PCP: [name], MD   DIAGNOSIS: KAMRIE FANTON is a 79 y.o. female with severe and profound anemia likely due to a primary bone marrow problem( MDS or AML).   PRIOR THERAPY: #1 Patient was hospitalized in 02/2011 for symptomatic anemia, lower extremity cellulitis, diastolic congestive heart failure, and pneumonia.  Her hemoglobin/hematocrit was documented as 7.0/20.2 at that time with a platelet count of 634 and MCV of 107.4 and she had a history of macrocytosis.     #2  She was seen by Dr. Welton Flakes in 03/2011 and underwent bone marrow biopsy on 05/28/2011 which showed hypercellular marrow with panmyeloid hyperplasia and fibrosis, peripheral blood showed normocytic-normochromic anemia and leukopenia.  Neutrophilic cells with hypogranulation was seen in the peripheral blood smear.  Iron stain showed a rare ring sideroblasts.  Core biopsy was markedly hypercellular for age with panmyeloid hyperplasia including prominent increase in megakaryocytes with clustering.  Immunohistochemical stain showed increased number of CD 117 positive cells correlating with early granulocytic and/or erythroid precursors.  Significant CD 34 positivity was not seen.  The differential diagnoses included a myelodysplastic state with fibrosis or myelodysplastic/myeloproliferative process with associated fibrosis.  #3  She received subcutaneous Procrit 40,000 units every 2 weeks from 11/20/2011 through 01/15/2012 x 3 injections however this was discontinued due to ineffectiveness.  #4 Most recent bone marrow biopsy on 02/18/2013 showed hypercellular bone marrow for age with pan myeloid proliferation associated with prominent fibrosis, normocytic-normochromic anemia, leukocytosis with left shifted neutrophilia including circulating blasts.  Flow cytometric  analysis performed on bone marrow material showed increased number of myeloblastic cells estimated at 10% of all cells.  Increased blast cells likely indicate progression of previous disease process (myelodysplastic versus myelodysplastic/myeloproliferative state) but falls short of a definitive diagnosis of acute leukemia.  #5 She received 3 cycles of Decitabine from 02/24/2012 through 05/08/2012.  However she developed febrile neutropenia and pneumonia and was subsequently hospitalized.  She has not received chemotherapy since and has been receiving supportive care.  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  She receives blood transfusions every 3-4 weeks.  Her condition may have converted to an acute leukemia.  #7 Iron overload with a ferritin level of 3632 noted on 11/30/2012.  She was started on Exjade on 12/25/2012.   CURRENT THERAPY: Supportive care including blood transfusions as indicated and Exjade 500 mg by mouth daily for iron overload.   INTERVAL HISTORY: Meredith Rodriguez is a 79 y.o. female who returns for follow up today for her AML./MDS.  She is accompanied for today's office visit by her husband Meredith Rodriguez.  Since her last office visit on 02/22/2013, her interval history is significant for receiving a blood transfusion of 2 units of packed red blood cells on 03/12/2013.   She continues to take Exjade 500 mg by mouth daily.  She states her vision is blurry since receiving chemotherapy but she does not need glasses to see.  Mrs. Fromme was encouraged to get her vision checked as soon as possible.  Mrs. Fifer denies any other symptomatology.  Her interval history is otherwise unremarkable and stable.   MEDICAL HISTORY: Past Medical History  Diagnosis Date  . Hypertension   . Diabetes mellitus   . Hyperkalemia   . IBS (irritable bowel syndrome)   . Hypercholesterolemia   .  Cervical radiculopathy   . Anemia   . Cancer     leukemia  .  Leukemia     ALLERGIES:   Allergies  Allergen Reactions  . Amoxicillin [Amoxicillin]     Upset stomach  . Cephalexin Nausea Only  . Metformin And Related     Gi problems  . Phenergan [Promethazine Hcl] Other (See Comments)    Unknown   . Procardia [Nifedipine]     dizziness     MEDICATIONS:  Current Outpatient Prescriptions  Medication Sig Dispense Refill  . amLODipine (NORVASC) 10 MG tablet Take 1 tablet (10 mg total) by mouth daily.  90 tablet  3  . aspirin 81 MG tablet Take 81 mg by mouth daily.      . cholecalciferol (VITAMIN D) 1000 UNITS tablet Take 1,000 Units by mouth daily.      Marland Kitchen deferasirox (EXJADE) 500 MG disintegrating tablet Take 1 tablet (500 mg total) by mouth daily before breakfast.  30 tablet  0  . FREESTYLE LITE test strip 1 each by Other route 2 (two) times daily.       . insulin glargine (LANTUS SOLOSTAR) 100 UNIT/ML injection Inject 0.1 mLs (10 Units total) into the skin daily.  5 pen  1  . insulin lispro (HUMALOG KWIKPEN) 100 UNIT/ML SOPN Inject 4 Units into the skin 2 (two) times daily with a meal. Inject 4 units twice daily into skin if blood glucose is over 100 at lunch and dinner      . Lancets (FREESTYLE) lancets 1 each by Other route 2 (two) times daily.       Marland Kitchen lidocaine-prilocaine (EMLA) cream Apply topically as needed.  30 g  4  . metoprolol (LOPRESSOR) 50 MG tablet Take 1 tablet (50 mg total) by mouth 2 (two) times daily.  180 tablet  3  . Omega-3 Fatty Acids (FISH OIL) 1000 MG CAPS Take 1,000 mg by mouth daily.       . simvastatin (ZOCOR) 5 MG tablet Take 5 mg by mouth at bedtime.      . [DISCONTINUED] glimepiride (AMARYL) 2 MG tablet Take 1 tablet (2 mg total) by mouth every morning.  30 tablet  11   Current Facility-Administered Medications  Medication Dose Route Frequency Provider Last Rate Last Dose  . TDaP (BOOSTRIX) injection 0.5 mL  0.5 mL Intramuscular Once [name], MD        SURGICAL HISTORY:  Past Surgical History    Procedure Laterality Date  . Varicose veins      surgey 1959  . Tonsilectomy, adenoidectomy, bilateral myringotomy and tubes    . Esophagogastroduodenoscopy  04/01/2011    Procedure: ESOPHAGOGASTRODUODENOSCOPY (EGD);  Surgeon: Barrie Folk, MD;  Location: [facility] ENDOSCOPY;  Service: Endoscopy;  Laterality: N/A;    REVIEW OF SYSTEMS:   A 10 point review of systems was completed and is negative except as noted above.  Mrs. Holderman denies any other symptomatology including  fatigue, fever or chills, headache, swollen glands, cough or shortness of breath, chest pain or discomfort, nausea, vomiting, diarrhea, constipation, change in urinary or bowel habits, arthralgias/myalgias, unusual bleeding/bruising or any other symptomatology.   PHYSICAL EXAMINATION: BP 134/69  Pulse 96  Temp(Src) 98.6 F (37 C) (Oral)  Resp 18  Ht 5\' 6"  (1.676 m)  Wt 106 lb 11.2 oz (48.399 kg)  BMI 17.23 kg/m2  ECOG FS: 1 - Symptomatic but completely ambulatory  General appearance: Alert, cooperative, well nourished, thin frame, no apparent distress Head: Normocephalic,  without obvious abnormality, atraumatic, hearing aid, teeth grinding evident Eyes: Arcus senilis, PERRLA, EOMI Nose: Nares, septum and mucosa are normal, no drainage or sinus tenderness Neck: No adenopathy, supple, symmetrical, trachea midline, no tenderness Resp: Clear to auscultation bilaterally, no wheezes/rales/rhonchi Cardio: Regular rate and rhythm, S1, S2 normal, no murmur, click, rub or gallop, no edema, right chest Port-A-Cath without signs of infection GI: Soft, not distended, non-tender, normoactive bowel sounds, no organomegaly Skin: No rashes/lesions, skin warm and dry, no erythematous areas, no cyanosis  M/S:  Atraumatic, normal strength in all extremities, normal range of motion, no clubbing  Lymph nodes: Cervical, supraclavicular, and axillary nodes normal Neurologic: Grossly normal, cranial nerves II through XII intact, alert and  oriented x 3 Psych: Appropriate affect   LABORATORY DATA: Lab Results  Component Value Date   WBC 3.8* 03/22/2013   HGB 11.6 03/22/2013   HCT 35.7 03/22/2013   MCV 91.1 03/22/2013   PLT 139* 03/22/2013      Chemistry      Component Value Date/Time   NA 135 02/10/2013 0620   NA 137 01/25/2013 0834   K 4.1 02/10/2013 0620   K 4.5 01/25/2013 0834   CL 101 02/10/2013 0620   CL 99 06/01/2012 1028   CO2 27 02/10/2013 0620   CO2 29 01/25/2013 0834   BUN 15 02/10/2013 0620   BUN 13.5 01/25/2013 0834   CREATININE 0.62 02/10/2013 0620   CREATININE 0.8 01/25/2013 0834   CREATININE 0.69 07/18/2010 1202   GLU 215* 02/26/2012 1255      Component Value Date/Time   CALCIUM 8.4 02/10/2013 0620   CALCIUM 9.4 01/25/2013 0834   ALKPHOS 55 02/07/2013 2047   ALKPHOS 58 01/25/2013 0834   AST 33 02/07/2013 2047   AST 24 01/25/2013 0834   ALT 34 02/07/2013 2047   ALT 30 01/25/2013 0834   BILITOT 0.9 02/07/2013 2047   BILITOT 1.14 01/25/2013 0834       RADIOGRAPHIC STUDIES: No results found.    ASSESSMENT: JENNAH SATCHELL is a 79 y.o. female with:  #1 Patient was hospitalized in 02/2011 for symptomatic anemia, lower extremity cellulitis, diastolic congestive heart failure, and pneumonia.  Her hemoglobin/hematocrit was documented as 7.0/20.2 at that time with a platelet count of 634 and MCV of 107.4 and she had a history of macrocytosis.     #2  She was seen by Dr. Welton Flakes in 03/2011 and underwent bone marrow biopsy on 05/28/2011 which showed hypercellular marrow with panmyeloid hyperplasia and fibrosis, peripheral blood showed normocytic-normochromic anemia and leukopenia.  Neutrophilic cells with hypogranulation was seen in the peripheral blood smear.  Iron stain showed a rare ring sideroblasts.  Core biopsy was markedly hypercellular for age with panmyeloid hyperplasia including prominent increase in megakaryocytes with clustering.  Immunohistochemical stain showed increased number of CD 117 positive cells  correlating with early granulocytic and/or erythroid precursors.  Significant CD 34 positivity was not seen.  The differential diagnoses included a myelodysplastic state with fibrosis or myelodysplastic/myeloproliferative process with associated fibrosis.  #3  She received subcutaneous Procrit 40,000 units every 2 weeks from 11/20/2011 through 01/15/2012 x 3 injections however this was discontinued due to ineffectiveness.  #4 Most recent bone marrow biopsy on 02/18/2013 showed hypercellular bone marrow for age with pan myeloid proliferation associated with prominent fibrosis, normocytic-normochromic anemia, leukocytosis with left shifted neutrophilia including circulating blasts.  Flow cytometric analysis performed on bone marrow material showed increased number of myeloblastic cells estimated at 10% of all cells.  Increased  blast cells likely indicate progression of previous disease process (myelodysplastic versus myelodysplastic/myeloproliferative state) but falls short of a definitive diagnosis of acute leukemia.  #5 She received 3 cycles of Decitabine from 02/24/2012 through 05/08/2012.  However she developed febrile neutropenia and pneumonia and was subsequently hospitalized.  She has not received chemotherapy since and has been receiving supportive care.  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  She receives blood transfusions every 3-4 weeks.   Her condition may have converted to an acute leukemia.   #7 Iron overload with a ferritin level of 3632 noted on 11/30/2012.  She was started on Exjade on 12/25/2012.   PLAN:  #1 Her hemoglobin and is 11.6 today and will not require a blood transfusion.  She will continue taking Exjade 500 mg by mouth daily for iron overload.  #2 We plan to see Mrs. Pinnock again in one month on 04/27/2013.  She will continue this receive weekly CBC and blood transfusions as indicated.  We will check iron studies (Iron/TIBC  and ferritin level) during her 03/29/2013 lab draw.   All questions answered.  Mr. and Mrs. Perra were encouraged to contact us in the interim with any questions, concerns, or problems.   Larina Bras, NP-C 03/22/2013   6:43 PM

## 2013-03-22 NOTE — Telephone Encounter (Signed)
, °

## 2013-03-26 LAB — AFB CULTURE WITH SMEAR (NOT AT ARMC): Acid Fast Smear: NONE SEEN

## 2013-03-28 ENCOUNTER — Other Ambulatory Visit: Payer: Self-pay | Admitting: Family Medicine

## 2013-03-29 ENCOUNTER — Other Ambulatory Visit (HOSPITAL_BASED_OUTPATIENT_CLINIC_OR_DEPARTMENT_OTHER): Payer: Medicare Other | Admitting: Lab

## 2013-03-29 ENCOUNTER — Ambulatory Visit: Payer: Medicare Other | Admitting: Adult Health

## 2013-03-29 DIAGNOSIS — D649 Anemia, unspecified: Secondary | ICD-10-CM

## 2013-03-29 DIAGNOSIS — C9200 Acute myeloblastic leukemia, not having achieved remission: Secondary | ICD-10-CM

## 2013-03-29 LAB — CBC WITH DIFFERENTIAL/PLATELET
BASO%: 0 % (ref 0.0–2.0)
Basophils Absolute: 0 10*3/uL (ref 0.0–0.1)
EOS%: 2.3 % (ref 0.0–7.0)
Eosinophils Absolute: 0.1 10*3/uL (ref 0.0–0.5)
HCT: 29.8 % — ABNORMAL LOW (ref 34.8–46.6)
HGB: 9.7 g/dL — ABNORMAL LOW (ref 11.6–15.9)
LYMPH%: 17.8 % (ref 14.0–49.7)
MCV: 91.4 fL (ref 79.5–101.0)
MONO#: 0 10*3/uL — ABNORMAL LOW (ref 0.1–0.9)
MONO%: 0.5 % (ref 0.0–14.0)
NEUT#: 3.4 10*3/uL (ref 1.5–6.5)
NEUT%: 79.4 % — ABNORMAL HIGH (ref 38.4–76.8)
Platelets: 188 10*3/uL (ref 145–400)
RBC: 3.26 10*6/uL — ABNORMAL LOW (ref 3.70–5.45)
RDW: 15.4 % — ABNORMAL HIGH (ref 11.2–14.5)
WBC: 4.3 10*3/uL (ref 3.9–10.3)
lymph#: 0.8 10*3/uL — ABNORMAL LOW (ref 0.9–3.3)

## 2013-03-29 LAB — IRON AND TIBC CHCC
%SAT: UNDETERMINED % (ref 21–?)
TIBC: 199 ug/dL — ABNORMAL LOW (ref 236–444)
UIBC: UNDETERMINED ug/dL (ref 120–384)

## 2013-03-29 LAB — HOLD TUBE, BLOOD BANK

## 2013-03-29 LAB — FERRITIN CHCC: Ferritin: 4412 ng/ml — ABNORMAL HIGH (ref 9–269)

## 2013-03-31 ENCOUNTER — Other Ambulatory Visit: Payer: Self-pay | Admitting: Adult Health

## 2013-04-05 ENCOUNTER — Other Ambulatory Visit: Payer: Self-pay | Admitting: Adult Health

## 2013-04-05 ENCOUNTER — Other Ambulatory Visit (HOSPITAL_BASED_OUTPATIENT_CLINIC_OR_DEPARTMENT_OTHER): Payer: Medicare Other

## 2013-04-05 ENCOUNTER — Ambulatory Visit (HOSPITAL_COMMUNITY)
Admission: RE | Admit: 2013-04-05 | Discharge: 2013-04-05 | Disposition: A | Discharge status: Home / Self Care | Payer: Medicare Other | Source: Ambulatory Visit | Attending: Oncology | Admitting: Oncology

## 2013-04-05 DIAGNOSIS — D630 Anemia in neoplastic disease: Secondary | ICD-10-CM

## 2013-04-05 DIAGNOSIS — D649 Anemia, unspecified: Secondary | ICD-10-CM | POA: Insufficient documentation

## 2013-04-05 DIAGNOSIS — C9200 Acute myeloblastic leukemia, not having achieved remission: Secondary | ICD-10-CM

## 2013-04-05 LAB — CBC WITH DIFFERENTIAL/PLATELET
HCT: 25.9 % — ABNORMAL LOW (ref 34.8–46.6)
HGB: 8.4 g/dL — ABNORMAL LOW (ref 11.6–15.9)
MCH: 29.3 pg (ref 25.1–34.0)
MCV: 90.2 fL (ref 79.5–101.0)
RBC: 2.87 10*6/uL — ABNORMAL LOW (ref 3.70–5.45)
RDW: 15.4 % — ABNORMAL HIGH (ref 11.2–14.5)
WBC: 5.5 10*3/uL (ref 3.9–10.3)

## 2013-04-05 LAB — MANUAL DIFFERENTIAL
ALC: 0.6 10*3/uL — ABNORMAL LOW (ref 0.9–3.3)
ANC (CHCC manual diff): 4.7 10*3/uL (ref 1.5–6.5)
EOS: 1 % (ref 0–7)
MONO: 1 % (ref 0–14)
Metamyelocytes: 2 % — ABNORMAL HIGH (ref 0–0)
Myelocytes: 0 % (ref 0–0)
Other Cell: 0 % (ref 0–0)
PROMYELO: 0 % (ref 0–0)
SEG: 73 % (ref 38–77)
Variant Lymph: 0 % (ref 0–0)

## 2013-04-05 LAB — HOLD TUBE, BLOOD BANK

## 2013-04-05 MED ORDER — SODIUM CHLORIDE 0.9 % IJ SOLN
3.0000 mL | INTRAMUSCULAR | Status: DC | PRN
  Filled 2013-04-05: qty 3

## 2013-04-06 ENCOUNTER — Ambulatory Visit (HOSPITAL_BASED_OUTPATIENT_CLINIC_OR_DEPARTMENT_OTHER): Payer: Medicare Other

## 2013-04-06 VITALS — BP 147/45 | HR 69 | Temp 98.6°F | Resp 18

## 2013-04-06 DIAGNOSIS — D630 Anemia in neoplastic disease: Secondary | ICD-10-CM

## 2013-04-06 DIAGNOSIS — C9200 Acute myeloblastic leukemia, not having achieved remission: Secondary | ICD-10-CM

## 2013-04-06 MED ORDER — ACETAMINOPHEN 325 MG PO TABS
650.0000 mg | ORAL_TABLET | Freq: Once | ORAL | Status: AC
  Administered 2013-04-06: 650 mg via ORAL

## 2013-04-06 MED ORDER — ACETAMINOPHEN 325 MG PO TABS
ORAL_TABLET | ORAL | Status: AC
  Filled 2013-04-06: qty 2

## 2013-04-06 MED ORDER — DIPHENHYDRAMINE HCL 25 MG PO CAPS
25.0000 mg | ORAL_CAPSULE | Freq: Once | ORAL | Status: AC
  Administered 2013-04-06: 25 mg via ORAL

## 2013-04-06 MED ORDER — DIPHENHYDRAMINE HCL 25 MG PO CAPS
ORAL_CAPSULE | ORAL | Status: AC
  Filled 2013-04-06: qty 1

## 2013-04-06 MED ORDER — ACETAMINOPHEN 325 MG PO TABS
ORAL_TABLET | ORAL | Status: AC
  Filled 2013-04-06: qty 1

## 2013-04-06 NOTE — Patient Instructions (Signed)
Blood Transfusion  A blood transfusion replaces your blood or some of its parts. Blood is replaced when you have lost blood because of surgery, an accident, or for severe blood conditions like anemia. You can donate blood to be used on yourself if you have a planned surgery. If you lose blood during that surgery, your own blood can be given back to you. Any blood given to you is checked to make sure it matches your blood type. Your temperature, blood pressure, and heart rate (vital signs) will be checked often.  GET HELP RIGHT AWAY IF:   You feel sick to your stomach (nauseous) or throw up (vomit).  You have watery poop (diarrhea).  You have shortness of breath or trouble breathing.  You have blood in your pee (urine) or have dark colored pee.  You have chest pain or tightness.  Your eyes or skin turn yellow (jaundice).  You have a temperature by mouth above 102 F (38.9 C), not controlled by medicine.  You start to shake and have chills.  You develop a a red rash (hives) or feel itchy.  You develop lightheadedness or feel confused.  You develop back, joint, or muscle pain.  You do not feel hungry (lost appetite).  You feel tired, restless, or nervous.  You develop belly (abdominal) cramps. Document Released: 07/12/2008 Document Revised: 07/08/2011 Document Reviewed: 07/12/2008 Exit[employer] Patient Information 2014 [employer].  

## 2013-04-07 LAB — TYPE AND SCREEN
ABO/RH(D): AB NEG
Antibody Screen: NEGATIVE
Donor AG Type: NEGATIVE
Unit division: 0

## 2013-04-12 ENCOUNTER — Other Ambulatory Visit (HOSPITAL_BASED_OUTPATIENT_CLINIC_OR_DEPARTMENT_OTHER): Payer: Medicare Other

## 2013-04-12 DIAGNOSIS — C9200 Acute myeloblastic leukemia, not having achieved remission: Secondary | ICD-10-CM

## 2013-04-12 LAB — CBC WITH DIFFERENTIAL/PLATELET
BASO%: 0 % (ref 0.0–2.0)
Basophils Absolute: 0 10*3/uL (ref 0.0–0.1)
HCT: 35.8 % (ref 34.8–46.6)
HGB: 11.6 g/dL (ref 11.6–15.9)
MCH: 29 pg (ref 25.1–34.0)
MCHC: 32.4 g/dL (ref 31.5–36.0)
MONO#: 0 10*3/uL — ABNORMAL LOW (ref 0.1–0.9)
NEUT%: 80.8 % — ABNORMAL HIGH (ref 38.4–76.8)
RBC: 4 10*6/uL (ref 3.70–5.45)
WBC: 4.9 10*3/uL (ref 3.9–10.3)
lymph#: 0.9 10*3/uL (ref 0.9–3.3)

## 2013-04-12 LAB — HOLD TUBE, BLOOD BANK

## 2013-04-19 ENCOUNTER — Telehealth: Payer: Self-pay | Admitting: Oncology

## 2013-04-19 ENCOUNTER — Encounter (INDEPENDENT_AMBULATORY_CARE_PROVIDER_SITE_OTHER): Payer: Self-pay

## 2013-04-19 ENCOUNTER — Other Ambulatory Visit: Payer: Self-pay | Admitting: Emergency Medicine

## 2013-04-19 ENCOUNTER — Other Ambulatory Visit (HOSPITAL_BASED_OUTPATIENT_CLINIC_OR_DEPARTMENT_OTHER): Payer: Medicare Other

## 2013-04-19 DIAGNOSIS — C9200 Acute myeloblastic leukemia, not having achieved remission: Secondary | ICD-10-CM

## 2013-04-19 LAB — CBC WITH DIFFERENTIAL/PLATELET
Basophils Absolute: 0 10*3/uL (ref 0.0–0.1)
Eosinophils Absolute: 0.1 10*3/uL (ref 0.0–0.5)
HCT: 32.1 % — ABNORMAL LOW (ref 34.8–46.6)
LYMPH%: 14.9 % (ref 14.0–49.7)
MONO#: 0 10*3/uL — ABNORMAL LOW (ref 0.1–0.9)
NEUT#: 4.5 10*3/uL (ref 1.5–6.5)
Platelets: 174 10*3/uL (ref 145–400)
RBC: 3.58 10*6/uL — ABNORMAL LOW (ref 3.70–5.45)
WBC: 5.4 10*3/uL (ref 3.9–10.3)
lymph#: 0.8 10*3/uL — ABNORMAL LOW (ref 0.9–3.3)
nRBC: 0 % (ref 0–0)

## 2013-04-19 LAB — HOLD TUBE, BLOOD BANK

## 2013-04-26 ENCOUNTER — Telehealth: Payer: Self-pay | Admitting: *Deleted

## 2013-04-26 ENCOUNTER — Other Ambulatory Visit: Payer: Self-pay | Admitting: Emergency Medicine

## 2013-04-26 NOTE — Telephone Encounter (Signed)
Per staff message I have scheduled appt 

## 2013-04-27 ENCOUNTER — Ambulatory Visit: Payer: Medicare Other | Admitting: Adult Health

## 2013-04-27 ENCOUNTER — Other Ambulatory Visit: Payer: Medicare Other

## 2013-04-28 ENCOUNTER — Other Ambulatory Visit: Payer: Self-pay | Admitting: Adult Health

## 2013-04-30 ENCOUNTER — Ambulatory Visit (HOSPITAL_COMMUNITY)
Admission: RE | Admit: 2013-04-30 | Discharge: 2013-04-30 | Disposition: A | Discharge status: Home / Self Care | Payer: Medicare Other | Source: Ambulatory Visit | Attending: Oncology | Admitting: Oncology

## 2013-04-30 ENCOUNTER — Other Ambulatory Visit: Payer: Self-pay | Admitting: Emergency Medicine

## 2013-04-30 ENCOUNTER — Other Ambulatory Visit (HOSPITAL_BASED_OUTPATIENT_CLINIC_OR_DEPARTMENT_OTHER): Payer: Medicare Other

## 2013-04-30 ENCOUNTER — Ambulatory Visit: Payer: Medicare Other

## 2013-04-30 ENCOUNTER — Telehealth: Payer: Self-pay | Admitting: *Deleted

## 2013-04-30 DIAGNOSIS — C9200 Acute myeloblastic leukemia, not having achieved remission: Secondary | ICD-10-CM

## 2013-04-30 DIAGNOSIS — D630 Anemia in neoplastic disease: Secondary | ICD-10-CM

## 2013-04-30 DIAGNOSIS — D649 Anemia, unspecified: Secondary | ICD-10-CM | POA: Insufficient documentation

## 2013-04-30 DIAGNOSIS — D469 Myelodysplastic syndrome, unspecified: Secondary | ICD-10-CM | POA: Insufficient documentation

## 2013-04-30 LAB — CBC WITH DIFFERENTIAL/PLATELET
BASO%: 0.1 % (ref 0.0–2.0)
Basophils Absolute: 0 10*3/uL (ref 0.0–0.1)
EOS%: 1.1 % (ref 0.0–7.0)
Eosinophils Absolute: 0.1 10*3/uL (ref 0.0–0.5)
HCT: 27.3 % — ABNORMAL LOW (ref 34.8–46.6)
HGB: 8.8 g/dL — ABNORMAL LOW (ref 11.6–15.9)
LYMPH%: 14.9 % (ref 14.0–49.7)
MCH: 28.2 pg (ref 25.1–34.0)
MCHC: 32.2 g/dL (ref 31.5–36.0)
MCV: 87.5 fL (ref 79.5–101.0)
MONO#: 0.1 10*3/uL (ref 0.1–0.9)
MONO%: 0.7 % (ref 0.0–14.0)
NEUT#: 6.2 10*3/uL (ref 1.5–6.5)
NEUT%: 83.2 % — AB (ref 38.4–76.8)
RBC: 3.12 10*6/uL — ABNORMAL LOW (ref 3.70–5.45)
RDW: 15.1 % — ABNORMAL HIGH (ref 11.2–14.5)
WBC: 7.5 10*3/uL (ref 3.9–10.3)
lymph#: 1.1 10*3/uL (ref 0.9–3.3)
nRBC: 0 % (ref 0–0)

## 2013-04-30 LAB — HOLD TUBE, BLOOD BANK

## 2013-04-30 LAB — TECHNOLOGIST REVIEW

## 2013-04-30 NOTE — Telephone Encounter (Signed)
Per POF I have scheduled appts and called them with the appts. I gave the husband the date/time. JMW

## 2013-04-30 NOTE — Progress Notes (Signed)
Hgb 8.8.  Pt asymptomatic.  Per [facility] - no blood transfusion required.

## 2013-05-05 ENCOUNTER — Other Ambulatory Visit: Payer: Self-pay | Admitting: Emergency Medicine

## 2013-05-06 ENCOUNTER — Other Ambulatory Visit: Payer: Self-pay | Admitting: Adult Health

## 2013-05-06 ENCOUNTER — Other Ambulatory Visit (HOSPITAL_BASED_OUTPATIENT_CLINIC_OR_DEPARTMENT_OTHER): Payer: Medicare Other

## 2013-05-06 ENCOUNTER — Ambulatory Visit (HOSPITAL_BASED_OUTPATIENT_CLINIC_OR_DEPARTMENT_OTHER): Payer: Medicare Other

## 2013-05-06 VITALS — BP 142/50 | HR 72 | Temp 98.1°F | Resp 18

## 2013-05-06 DIAGNOSIS — D630 Anemia in neoplastic disease: Secondary | ICD-10-CM

## 2013-05-06 DIAGNOSIS — D649 Anemia, unspecified: Secondary | ICD-10-CM

## 2013-05-06 DIAGNOSIS — C9200 Acute myeloblastic leukemia, not having achieved remission: Secondary | ICD-10-CM

## 2013-05-06 LAB — CBC WITH DIFFERENTIAL/PLATELET
BASO%: 0.2 % (ref 0.0–2.0)
Basophils Absolute: 0 10*3/uL (ref 0.0–0.1)
EOS ABS: 0 10*3/uL (ref 0.0–0.5)
EOS%: 0.6 % (ref 0.0–7.0)
HCT: 24.1 % — ABNORMAL LOW (ref 34.8–46.6)
HEMOGLOBIN: 7.9 g/dL — AB (ref 11.6–15.9)
LYMPH%: 15 % (ref 14.0–49.7)
MCH: 28.8 pg (ref 25.1–34.0)
MCHC: 32.8 g/dL (ref 31.5–36.0)
MCV: 88 fL (ref 79.5–101.0)
MONO#: 0.1 10*3/uL (ref 0.1–0.9)
MONO%: 0.8 % (ref 0.0–14.0)
NEUT%: 83.4 % — ABNORMAL HIGH (ref 38.4–76.8)
NEUTROS ABS: 5.5 10*3/uL (ref 1.5–6.5)
Platelets: 220 10*3/uL (ref 145–400)
RBC: 2.74 10*6/uL — ABNORMAL LOW (ref 3.70–5.45)
RDW: 15 % — AB (ref 11.2–14.5)
WBC: 6.5 10*3/uL (ref 3.9–10.3)
lymph#: 1 10*3/uL (ref 0.9–3.3)
nRBC: 0 % (ref 0–0)

## 2013-05-06 LAB — TECHNOLOGIST REVIEW

## 2013-05-06 LAB — PREPARE RBC (CROSSMATCH)

## 2013-05-06 LAB — HOLD TUBE, BLOOD BANK

## 2013-05-06 MED ORDER — DIPHENHYDRAMINE HCL 25 MG PO CAPS
ORAL_CAPSULE | ORAL | Status: AC
  Filled 2013-05-06: qty 1

## 2013-05-06 MED ORDER — ACETAMINOPHEN 325 MG PO TABS
ORAL_TABLET | ORAL | Status: AC
  Filled 2013-05-06: qty 2

## 2013-05-06 MED ORDER — SODIUM CHLORIDE 0.9 % IV SOLN
250.0000 mL | Freq: Once | INTRAVENOUS | Status: AC
  Administered 2013-05-06: 250 mL via INTRAVENOUS

## 2013-05-06 MED ORDER — FUROSEMIDE 10 MG/ML IJ SOLN
20.0000 mg | Freq: Once | INTRAMUSCULAR | Status: AC
  Administered 2013-05-06: 18:00:00 via INTRAVENOUS

## 2013-05-06 MED ORDER — HEPARIN SOD (PORK) LOCK FLUSH 100 UNIT/ML IV SOLN
500.0000 [IU] | Freq: Every day | INTRAVENOUS | Status: AC | PRN
  Administered 2013-05-06: 500 [IU]
  Filled 2013-05-06: qty 5

## 2013-05-06 MED ORDER — ACETAMINOPHEN 325 MG PO TABS
650.0000 mg | ORAL_TABLET | Freq: Once | ORAL | Status: AC
  Administered 2013-05-06: 650 mg via ORAL

## 2013-05-06 MED ORDER — DIPHENHYDRAMINE HCL 25 MG PO CAPS
25.0000 mg | ORAL_CAPSULE | Freq: Once | ORAL | Status: AC
  Administered 2013-05-06: 25 mg via ORAL

## 2013-05-06 MED ORDER — SODIUM CHLORIDE 0.9 % IJ SOLN
10.0000 mL | INTRAMUSCULAR | Status: AC | PRN
  Administered 2013-05-06: 10 mL
  Filled 2013-05-06: qty 10

## 2013-05-06 NOTE — Patient Instructions (Signed)
Blood Transfusion  A blood transfusion replaces your blood or some of its parts. Blood is replaced when you have lost blood because of surgery, an accident, or for severe blood conditions like anemia. You can donate blood to be used on yourself if you have a planned surgery. If you lose blood during that surgery, your own blood can be given back to you. Any blood given to you is checked to make sure it matches your blood type. Your temperature, blood pressure, and heart rate (vital signs) will be checked often.  GET HELP RIGHT AWAY IF:   You feel sick to your stomach (nauseous) or throw up (vomit).  You have watery poop (diarrhea).  You have shortness of breath or trouble breathing.  You have blood in your pee (urine) or have dark colored pee.  You have chest pain or tightness.  Your eyes or skin turn yellow (jaundice).  You have a temperature by mouth above 102 F (38.9 C), not controlled by medicine.  You start to shake and have chills.  You develop a a red rash (hives) or feel itchy.  You develop lightheadedness or feel confused.  You develop back, joint, or muscle pain.  You do not feel hungry (lost appetite).  You feel tired, restless, or nervous.  You develop belly (abdominal) cramps. Document Released: 07/12/2008 Document Revised: 07/08/2011 Document Reviewed: 07/12/2008 Exit[employer] Patient Information 2014 [employer].  

## 2013-05-07 LAB — TYPE AND SCREEN
ABO/RH(D): AB NEG
Antibody Screen: NEGATIVE
Donor AG Type: NEGATIVE
Donor AG Type: NEGATIVE
Unit division: 0
Unit division: 0

## 2013-05-11 ENCOUNTER — Telehealth: Payer: Self-pay | Admitting: Oncology

## 2013-05-11 NOTE — Telephone Encounter (Signed)
, °

## 2013-05-12 ENCOUNTER — Other Ambulatory Visit: Payer: Medicare Other

## 2013-05-12 ENCOUNTER — Ambulatory Visit: Payer: Medicare Other | Admitting: Adult Health

## 2013-05-17 ENCOUNTER — Ambulatory Visit (HOSPITAL_BASED_OUTPATIENT_CLINIC_OR_DEPARTMENT_OTHER): Payer: Medicare Other | Admitting: Hematology and Oncology

## 2013-05-17 ENCOUNTER — Other Ambulatory Visit (HOSPITAL_BASED_OUTPATIENT_CLINIC_OR_DEPARTMENT_OTHER): Payer: Medicare Other

## 2013-05-17 ENCOUNTER — Telehealth: Payer: Self-pay | Admitting: Oncology

## 2013-05-17 VITALS — BP 144/71 | HR 74 | Temp 97.9°F | Resp 20 | Ht 66.0 in | Wt 107.5 lb

## 2013-05-17 DIAGNOSIS — D4620 Refractory anemia with excess of blasts, unspecified: Secondary | ICD-10-CM

## 2013-05-17 DIAGNOSIS — D649 Anemia, unspecified: Secondary | ICD-10-CM

## 2013-05-17 DIAGNOSIS — C9200 Acute myeloblastic leukemia, not having achieved remission: Secondary | ICD-10-CM

## 2013-05-17 LAB — CBC WITH DIFFERENTIAL/PLATELET
BASO%: 0.2 % (ref 0.0–2.0)
Basophils Absolute: 0 10*3/uL (ref 0.0–0.1)
EOS%: 0.2 % (ref 0.0–7.0)
Eosinophils Absolute: 0 10*3/uL (ref 0.0–0.5)
HCT: 33.1 % — ABNORMAL LOW (ref 34.8–46.6)
HGB: 10.6 g/dL — ABNORMAL LOW (ref 11.6–15.9)
LYMPH#: 0.9 10*3/uL (ref 0.9–3.3)
LYMPH%: 19.5 % (ref 14.0–49.7)
MCH: 28.2 pg (ref 25.1–34.0)
MCHC: 32 g/dL (ref 31.5–36.0)
MCV: 88 fL (ref 79.5–101.0)
MONO#: 0.2 10*3/uL (ref 0.1–0.9)
MONO%: 3.6 % (ref 0.0–14.0)
NEUT#: 3.4 10*3/uL (ref 1.5–6.5)
NEUT%: 76.5 % (ref 38.4–76.8)
NRBC: 0 % (ref 0–0)
Platelets: 118 10*3/uL — ABNORMAL LOW (ref 145–400)
RBC: 3.76 10*6/uL (ref 3.70–5.45)
RDW: 14.7 % — AB (ref 11.2–14.5)
WBC: 4.5 10*3/uL (ref 3.9–10.3)

## 2013-05-17 LAB — HOLD TUBE, BLOOD BANK

## 2013-05-17 LAB — TECHNOLOGIST REVIEW

## 2013-05-17 NOTE — Progress Notes (Signed)
OFFICE PROGRESS NOTE  [place], MD [place] [address]  DIAGNOSIS: 80 year old female with severe and profound anemia due to a primary bone marrow problem-MDS or ?AML  CURRENT THERAPY:Supportive care  PRIOR THERAPY:  #1. She was on Procrit every 2 weeks unfortunately this was not effective and discontinued   #3 patient is transfusion dependent anemia in the setting of myelodysplastic syndrome cannot exclude conversion to an acute leukemia.  #4 Patient received 3 cycles of Decitabine chemotherapy, her third cycle was complicated by febrile neutropenia and pneumonia causing hospitalization.  She is at present on supportive therapy   INTERVAL HISTORY: Meredith Rodriguez 80 y.o. female returns for follow up today for her MDS/?AML . She says that her last blood transfusion was approximately 2 weeks ago.  She complains of mild fatigue,  other than that denis any shortness of breath. she says that she's eating well. She is accompanied by her son and, husband today. She is taking Exjade 500 mg once daily for her iron overload. Her last ferritin was 4412 performed on 03/29/2013.  MEDICAL HISTORY: Past Medical History  Diagnosis Date  . Hypertension   . Diabetes mellitus   . Hyperkalemia   . IBS (irritable bowel syndrome)   . Hypercholesterolemia   . Cervical radiculopathy   . Anemia   . Cancer     leukemia  . Leukemia     ALLERGIES:  is allergic to amoxicillin; cephalexin; metformin and related; phenergan; and procardia.  MEDICATIONS:  Current Outpatient Prescriptions  Medication Sig Dispense Refill  . amLODipine (NORVASC) 10 MG tablet Take 1 tablet (10 mg total) by mouth daily.  90 tablet  3  . aspirin 81 MG tablet Take 81 mg by mouth daily.      Marland Kitchen EXJADE 500 MG disintegrating tablet TAKE 1 TABLET (500 MG TOTAL) BY MOUTH DAILY BEFORE BREAKFAST.  30 tablet  0  . FREESTYLE LITE test strip 1 each by Other route 2 (two) times daily.       . insulin  lispro (HUMALOG KWIKPEN) 100 UNIT/ML SOPN Inject 4 Units into the skin 2 (two) times daily with a meal. Inject 4 units twice daily into skin if blood glucose is over 100 at lunch and dinner      . Lancets (FREESTYLE) lancets 1 each by Other route 2 (two) times daily.       Marland Kitchen LANTUS SOLOSTAR 100 UNIT/ML SOPN INJECT 10 UNITS INTO THE SKIN DAILY  15 mL  3  . lidocaine-prilocaine (EMLA) cream Apply topically as needed.  30 g  4  . metoprolol (LOPRESSOR) 50 MG tablet Take 1 tablet (50 mg total) by mouth 2 (two) times daily.  180 tablet  3  . Omega-3 Fatty Acids (FISH OIL) 1000 MG CAPS Take 1,000 mg by mouth daily.       . cholecalciferol (VITAMIN D) 1000 UNITS tablet Take 1,000 Units by mouth daily.      . simvastatin (ZOCOR) 5 MG tablet Take 5 mg by mouth at bedtime.      . [DISCONTINUED] glimepiride (AMARYL) 2 MG tablet Take 1 tablet (2 mg total) by mouth every morning.  30 tablet  11   Current Facility-Administered Medications  Medication Dose Route Frequency Provider Last Rate Last Dose  . TDaP (BOOSTRIX) injection 0.5 mL  0.5 mL Intramuscular Once [name], MD        SURGICAL HISTORY:  Past Surgical History  Procedure Laterality Date  . Varicose  veins      surgey 1959  . Tonsilectomy, adenoidectomy, bilateral myringotomy and tubes    . Esophagogastroduodenoscopy  04/01/2011    Procedure: ESOPHAGOGASTRODUODENOSCOPY (EGD);  Surgeon: Missy Sabins, MD;  Location: [facility] ENDOSCOPY;  Service: Endoscopy;  Laterality: N/A;    REVIEW OF SYSTEMS:   A 14 point review of systems was conducted and is otherwise negative except for what is noted above.      PHYSICAL EXAMINATION: BP 144/71  Pulse 74  Temp(Src) 97.9 F (36.6 C) (Oral)  Resp 20  Ht 5\' 6"  (1.676 m)  Wt 107 lb 8 oz (48.762 kg)  BMI 17.36 kg/m2 General: Patient is a well appearing female in no acute distress HEENT: PERRLA, sclerae anicteric no conjunctival pallor, MMM no erythema or exudate Neck: supple, no  lymphadenopathy Breast exam: No masses felt Lungs: clear to auscultation bilaterally, no wheezes, rhonchi, or rales Cardiovascular: regular rate rhythm, S1, S2, no murmurs, rubs or gallops Abdomen: Soft, non-tender, non-distended, normoactive bowel sounds, no HSM Extremities: warm and well perfused, no clubbing, cyanosis, or edema Skin: No rashes.    Neuro: Non-focal ECOG PERFORMANCE STATUS: 1  LABORATORY DATA: Lab Results  Component Value Date   WBC 4.5 05/17/2013   HGB 10.6* 05/17/2013   HCT 33.1* 05/17/2013   MCV 88.0 05/17/2013   PLT 118* 05/17/2013      Chemistry      Component Value Date/Time   NA 135 02/10/2013 0620   NA 137 01/25/2013 0834   K 4.1 02/10/2013 0620   K 4.5 01/25/2013 0834   CL 101 02/10/2013 0620   CL 99 06/01/2012 1028   CO2 27 02/10/2013 0620   CO2 29 01/25/2013 0834   BUN 15 02/10/2013 0620   BUN 13.5 01/25/2013 0834   CREATININE 0.62 02/10/2013 0620   CREATININE 0.8 01/25/2013 0834   CREATININE 0.69 07/18/2010 1202   GLU 215* 02/26/2012 1255      Component Value Date/Time   CALCIUM 8.4 02/10/2013 0620   CALCIUM 9.4 01/25/2013 0834   ALKPHOS 55 02/07/2013 2047   ALKPHOS 58 01/25/2013 0834   AST 33 02/07/2013 2047   AST 24 01/25/2013 0834   ALT 34 02/07/2013 2047   ALT 30 01/25/2013 0834   BILITOT 0.9 02/07/2013 2047   BILITOT 1.14 01/25/2013 0834       RADIOGRAPHIC STUDIES:  No results found.  ASSESSMENT: 80 year old female with:  1.  MDS with refractory  anemia not responsive to Procrit   2. Patient was started on Decitabine chemotherapy.  She received three cycles of this, and then had an episode of febrile neutropenia resulting in pneumonia and hospitalization.  Her chemotherapy has been discontinued due to complications of febrile neutropenia and pneumonia. Continue supportive therapy and as needed blood transfusions. Her hemoglobin is 10.6 g/dl. and hematocrit today is 37% and she does not need blood transfusion today. Monotor weekly CBC and  diff as scheduled  3. Iron overload: Continue Exjade milligram by mouth once daily. Repeat iron indices prior to next visit. We'll increase the dose of Exjade after reviewing the repeat iron indices   4. she is up-to-date with her flu vaccine  5.bilateral breast screening mammogram has been scheduled   All questions were answered. The patient knows to call the clinic with any problems, questions or concerns. We can certainly see the patient much sooner if necessary.  I spent 20 minutes counseling the patient face to face. The total time spent in the appointment was 30 minutes.  Wilmon Arms , MD Medical/Oncology [facility] [phone] (beeper) [phone] (Office)

## 2013-05-18 LAB — HM MAMMOGRAPHY

## 2013-05-19 ENCOUNTER — Other Ambulatory Visit: Payer: Self-pay | Admitting: Family Medicine

## 2013-05-19 ENCOUNTER — Encounter: Payer: Self-pay | Admitting: Family Medicine

## 2013-05-24 ENCOUNTER — Other Ambulatory Visit (HOSPITAL_BASED_OUTPATIENT_CLINIC_OR_DEPARTMENT_OTHER): Payer: Medicare Other

## 2013-05-24 ENCOUNTER — Encounter (INDEPENDENT_AMBULATORY_CARE_PROVIDER_SITE_OTHER): Payer: Self-pay

## 2013-05-24 DIAGNOSIS — C9200 Acute myeloblastic leukemia, not having achieved remission: Secondary | ICD-10-CM

## 2013-05-24 DIAGNOSIS — D649 Anemia, unspecified: Secondary | ICD-10-CM

## 2013-05-24 LAB — CBC WITH DIFFERENTIAL/PLATELET
BASO%: 0 % (ref 0.0–2.0)
BASOS ABS: 0 10*3/uL (ref 0.0–0.1)
EOS%: 0.6 % (ref 0.0–7.0)
Eosinophils Absolute: 0 10*3/uL (ref 0.0–0.5)
HEMATOCRIT: 28.7 % — AB (ref 34.8–46.6)
HEMOGLOBIN: 9.3 g/dL — AB (ref 11.6–15.9)
LYMPH%: 20.9 % (ref 14.0–49.7)
MCH: 28.4 pg (ref 25.1–34.0)
MCHC: 32.4 g/dL (ref 31.5–36.0)
MCV: 87.8 fL (ref 79.5–101.0)
MONO#: 0.2 10*3/uL (ref 0.1–0.9)
MONO%: 3.1 % (ref 0.0–14.0)
NEUT#: 3.6 10*3/uL (ref 1.5–6.5)
NEUT%: 75.4 % (ref 38.4–76.8)
Platelets: 147 10*3/uL (ref 145–400)
RBC: 3.27 10*6/uL — ABNORMAL LOW (ref 3.70–5.45)
RDW: 14.7 % — AB (ref 11.2–14.5)
WBC: 4.8 10*3/uL (ref 3.9–10.3)
lymph#: 1 10*3/uL (ref 0.9–3.3)
nRBC: 0 % (ref 0–0)

## 2013-05-24 LAB — TECHNOLOGIST REVIEW

## 2013-05-24 LAB — HOLD TUBE, BLOOD BANK

## 2013-05-24 LAB — IRON AND TIBC CHCC
%SAT: UNDETERMINED % (ref 21–?)
Iron: 203 ug/dL — ABNORMAL HIGH (ref 41–142)
TIBC: 170 ug/dL — ABNORMAL LOW (ref 236–444)
UIBC: UNDETERMINED ug/dL (ref 120–384)

## 2013-05-24 LAB — FERRITIN CHCC

## 2013-05-27 ENCOUNTER — Telehealth: Payer: Self-pay | Admitting: Family Medicine

## 2013-05-27 ENCOUNTER — Ambulatory Visit (INDEPENDENT_AMBULATORY_CARE_PROVIDER_SITE_OTHER): Payer: Medicare Other | Admitting: Family Medicine

## 2013-05-27 ENCOUNTER — Encounter: Payer: Self-pay | Admitting: Family Medicine

## 2013-05-27 VITALS — BP 120/64 | HR 88 | Temp 97.9°F | Wt 108.0 lb

## 2013-05-27 DIAGNOSIS — R197 Diarrhea, unspecified: Secondary | ICD-10-CM

## 2013-05-27 NOTE — Telephone Encounter (Signed)
Seeing Burchette

## 2013-05-27 NOTE — Telephone Encounter (Signed)
Patient Information:  Caller Name: Juliane  Phone: [phone]  Patient: Meredith Rodriguez  Gender: Female  DOB: [birth year]  Age: 80 Years  PCP: Carolann Littler [facility])  Office Follow Up:  Does the office need to follow up with this patient?: No  Instructions For The Office: N/A  RN Note:  Care advise reviewed. Reviewed appt time with Tonya in office.  Symptoms  Reason For Call & Symptoms: Patient states she has "a bug and feels weak".  +diarrhea, +nausea. Onset 05/25/13.  She denies fever.  Stools yesterday (05/26/13) x2 . +Mouth Ulcer.  Decreased appetite- + drinking ginger ale and gatoraide. Last UOP- 1 hour. states her mouth is dry.   She is requesting appt to be evaluated for weakness.  +Leukemia and every 3-4 weeks receives blood transfusions.  Reviewed Health History In EMR: Yes  Reviewed Medications In EMR: Yes  Reviewed Allergies In EMR: Yes  Reviewed Surgeries / Procedures: Yes  Date of Onset of Symptoms: 05/24/2013  Guideline(s) Used:  Diarrhea  Disposition Per Guideline:   See Today in Office  Reason For Disposition Reached:   Weak immune system (e.g., HIV positive, cancer chemo, splenectomy, organ transplant, chronic steroids)  Advice Given:  Fluids:  Drink more fluids, at least 8-10 glasses (8 oz or 240 ml) daily.  For example: sports drinks, diluted fruit juices, soft drinks.  Supplement this with saltine crackers or soups to make certain that you are getting sufficient fluid and salt to meet your body's needs.  Nutrition:  Ideal initial foods include boiled starches/cereals (e.g., potatoes, rice, noodles, wheat, oats) with a small amount of salt to taste.  Other acceptable foods include: bananas, yogurt, crackers, soup.  Call Back If:  Signs of dehydration occur (e.g., no urine for more than 12 hours, very dry mouth, lightheaded, etc.)  You become worse.  Patient Will Follow Care Advice:  YES  Appointment Scheduled:  05/27/2013 10:45:00 Appointment  Scheduled Provider:  Roxy [facility])

## 2013-05-27 NOTE — Progress Notes (Signed)
Pre visit review using our clinic review tool, if applicable. No additional management support is needed unless otherwise documented below in the visit note. 

## 2013-05-27 NOTE — Telephone Encounter (Signed)
Patient coming to see PC today.

## 2013-05-27 NOTE — Progress Notes (Signed)
   Subjective:    Patient ID: Meredith Rodriguez, female    DOB: [birth year], 80 y.o.   MRN: [id number]  HPI Acute visit Two day history of diarrhea which has been watery and nonbloody. She's had some mild nausea but no vomiting. Nausea symptoms are relatively mild. She's not any recent antibiotics. She has some mild diffuse abdominal cramps. No recent travels. She has diabetes and is on low-dose insulin but has not been monitoring her blood sugars as much. She has not had any hypoglycemic symptoms. No sick exposures. No fevers. She's had some generalized malaise. She has kept down fluids well this morning.  Past Medical History  Diagnosis Date  . Hypertension   . Diabetes mellitus   . Hyperkalemia   . IBS (irritable bowel syndrome)   . Hypercholesterolemia   . Cervical radiculopathy   . Anemia   . Cancer     leukemia  . Leukemia    Past Surgical History  Procedure Laterality Date  . Varicose veins      surgey 1959  . Tonsilectomy, adenoidectomy, bilateral myringotomy and tubes    . Esophagogastroduodenoscopy  04/01/2011    Procedure: ESOPHAGOGASTRODUODENOSCOPY (EGD);  Surgeon: Missy Sabins, MD;  Location: [facility] ENDOSCOPY;  Service: Endoscopy;  Laterality: N/A;    reports that she has never smoked. She has never used smokeless tobacco. She reports that she does not drink alcohol or use illicit drugs. family history includes Cancer in her daughter, sister, sister, and sister; Diabetes in her sister; Hypertension in her father; Stroke in her mother. Allergies  Allergen Reactions  . Amoxicillin [Amoxicillin]     Upset stomach  . Cephalexin Nausea Only  . Metformin And Related     Gi problems  . Phenergan [Promethazine Hcl] Other (See Comments)    Unknown   . Procardia [Nifedipine]     dizziness  ]    Review of Systems  Constitutional: Negative for fever and chills.  Respiratory: Negative for cough and shortness of breath.   Cardiovascular: Negative for chest pain.  Gastrointestinal:  Positive for nausea and diarrhea. Negative for vomiting and constipation.  Genitourinary: Negative for dysuria.  Neurological: Negative for dizziness.       Objective:   Physical Exam  Constitutional: She appears well-developed and well-nourished.  HENT:  Mouth/Throat: Oropharynx is clear and moist.  Neck: Neck supple.  Cardiovascular: Normal rate.   Pulmonary/Chest: Effort normal and breath sounds normal. No respiratory distress. She has no wheezes. She has no rales.  Abdominal: Soft. Bowel sounds are normal. She exhibits no distension and no mass. There is no tenderness. There is no rebound and no guarding.          Assessment & Plan:  Diarrhea. Suspect viral. Her symptoms are relatively mild. She does not appear dehydrated. Handout given. Continue to eat bland foods and increase hydration. Followup promptly for any vomiting or worsening symptoms She is instructed to hold her short acting insulin if her intake is not steady. She is also encouraged to continue to monitor blood sugars

## 2013-05-27 NOTE — Patient Instructions (Signed)
Diarrhea Diarrhea is frequent loose and watery bowel movements. It can cause you to feel weak and dehydrated. Dehydration can cause you to become tired and thirsty, have a dry mouth, and have decreased urination that often is dark yellow. Diarrhea is a sign of another problem, most often an infection that will not last long. In most cases, diarrhea typically lasts 2 3 days. However, it can last longer if it is a sign of something more serious. It is important to treat your diarrhea as directed by your caregive to lessen or prevent future episodes of diarrhea. CAUSES  Some common causes include:  Gastrointestinal infections caused by viruses, bacteria, or parasites.  Food poisoning or food allergies.  Certain medicines, such as antibiotics, chemotherapy, and laxatives.  Artificial sweeteners and fructose.  Digestive disorders. HOME CARE INSTRUCTIONS  Ensure adequate fluid intake (hydration): have 1 cup (8 oz) of fluid for each diarrhea episode. Avoid fluids that contain simple sugars or sports drinks, fruit juices, whole milk products, and sodas. Your urine should be clear or pale yellow if you are drinking enough fluids. Hydrate with an oral rehydration solution that you can purchase at pharmacies, retail stores, and online. You can prepare an oral rehydration solution at home by mixing the following ingredients together:    tsp table salt.   tsp baking soda.   tsp salt substitute containing potassium chloride.  1  tablespoons sugar.  1 L (34 oz) of water.  Certain foods and beverages may increase the speed at which food moves through the gastrointestinal (GI) tract. These foods and beverages should be avoided and include:  Caffeinated and alcoholic beverages.  High-fiber foods, such as raw fruits and vegetables, nuts, seeds, and whole grain breads and cereals.  Foods and beverages sweetened with sugar alcohols, such as xylitol, sorbitol, and mannitol.  Some foods may be well  tolerated and may help thicken stool including:  Starchy foods, such as rice, toast, pasta, low-sugar cereal, oatmeal, grits, baked potatoes, crackers, and bagels.  Bananas.  Applesauce.  Add probiotic-rich foods to help increase healthy bacteria in the GI tract, such as yogurt and fermented milk products.  Wash your hands well after each diarrhea episode.  Only take over-the-counter or prescription medicines as directed by your caregiver.  Take a warm bath to relieve any burning or pain from frequent diarrhea episodes. SEEK IMMEDIATE MEDICAL CARE IF:   You are unable to keep fluids down.  You have persistent vomiting.  You have blood in your stool, or your stools are black and tarry.  You do not urinate in 6 8 hours, or there is only a small amount of very dark urine.  You have abdominal pain that increases or localizes.  You have weakness, dizziness, confusion, or lightheadedness.  You have a severe headache.  Your diarrhea gets worse or does not get better.  You have a fever or persistent symptoms for more than 2 3 days.  You have a fever and your symptoms suddenly get worse. MAKE SURE YOU:   Understand these instructions.  Will watch your condition.  Will get help right away if you are not doing well or get worse. Document Released: 04/05/2002 Document Revised: 04/01/2012 Document Reviewed: 12/22/2011 Exit[employer] Patient Information 2014 [employer].  

## 2013-05-31 ENCOUNTER — Other Ambulatory Visit: Payer: Self-pay | Admitting: Emergency Medicine

## 2013-05-31 ENCOUNTER — Ambulatory Visit (HOSPITAL_COMMUNITY)
Admission: RE | Admit: 2013-05-31 | Discharge: 2013-05-31 | Disposition: A | Discharge status: Home / Self Care | Payer: Medicare Other | Source: Ambulatory Visit | Attending: Oncology | Admitting: Oncology

## 2013-05-31 ENCOUNTER — Telehealth: Payer: Self-pay | Admitting: *Deleted

## 2013-05-31 ENCOUNTER — Other Ambulatory Visit (HOSPITAL_BASED_OUTPATIENT_CLINIC_OR_DEPARTMENT_OTHER): Payer: Medicare Other

## 2013-05-31 DIAGNOSIS — D469 Myelodysplastic syndrome, unspecified: Secondary | ICD-10-CM | POA: Insufficient documentation

## 2013-05-31 DIAGNOSIS — C9200 Acute myeloblastic leukemia, not having achieved remission: Secondary | ICD-10-CM

## 2013-05-31 DIAGNOSIS — D630 Anemia in neoplastic disease: Secondary | ICD-10-CM | POA: Insufficient documentation

## 2013-05-31 LAB — MANUAL DIFFERENTIAL
ALC: 1 10*3/uL (ref 0.9–3.3)
ANC (CHCC manual diff): 3.6 10*3/uL (ref 1.5–6.5)
BASOPHIL: 0 % (ref 0–2)
Band Neutrophils: 3 % (ref 0–10)
Blasts: 3 % — ABNORMAL HIGH (ref 0–0)
EOS: 0 % (ref 0–7)
LYMPH: 20 % (ref 14–49)
MONO: 3 % (ref 0–14)
Metamyelocytes: 2 % — ABNORMAL HIGH (ref 0–0)
Myelocytes: 1 % — ABNORMAL HIGH (ref 0–0)
Other Cell: 0 % (ref 0–0)
PLT EST: ADEQUATE
PROMYELO: 0 % (ref 0–0)
SEG: 68 % (ref 38–77)
Variant Lymph: 0 % (ref 0–0)
nRBC: 1 % — ABNORMAL HIGH (ref 0–0)

## 2013-05-31 LAB — CBC WITH DIFFERENTIAL/PLATELET
HCT: 25.4 % — ABNORMAL LOW (ref 34.8–46.6)
HGB: 8.3 g/dL — ABNORMAL LOW (ref 11.6–15.9)
MCH: 28 pg (ref 25.1–34.0)
MCHC: 32.7 g/dL (ref 31.5–36.0)
MCV: 85.8 fL (ref 79.5–101.0)
RBC: 2.96 10*6/uL — AB (ref 3.70–5.45)
RDW: 14.4 % (ref 11.2–14.5)
WBC: 4.9 10*3/uL (ref 3.9–10.3)

## 2013-05-31 LAB — HOLD TUBE, BLOOD BANK

## 2013-05-31 NOTE — Telephone Encounter (Signed)
Per staff message and POF I have scheduled appts.  JMW  

## 2013-06-01 ENCOUNTER — Telehealth: Payer: Self-pay | Admitting: Oncology

## 2013-06-01 ENCOUNTER — Other Ambulatory Visit: Payer: Self-pay | Admitting: Cardiology

## 2013-06-01 ENCOUNTER — Encounter: Payer: Self-pay | Admitting: Family Medicine

## 2013-06-01 ENCOUNTER — Ambulatory Visit (INDEPENDENT_AMBULATORY_CARE_PROVIDER_SITE_OTHER): Payer: Medicare Other | Admitting: Family Medicine

## 2013-06-01 VITALS — BP 118/64 | HR 89 | Temp 97.5°F | Wt 106.0 lb

## 2013-06-01 DIAGNOSIS — E1165 Type 2 diabetes mellitus with hyperglycemia: Secondary | ICD-10-CM

## 2013-06-01 DIAGNOSIS — E119 Type 2 diabetes mellitus without complications: Secondary | ICD-10-CM

## 2013-06-01 DIAGNOSIS — I10 Essential (primary) hypertension: Secondary | ICD-10-CM

## 2013-06-01 LAB — HM DIABETES FOOT EXAM: HM Diabetic Foot Exam: NORMAL

## 2013-06-01 LAB — HEMOGLOBIN A1C: Hgb A1c MFr Bld: 9 % — ABNORMAL HIGH (ref 4.6–6.5)

## 2013-06-01 NOTE — Telephone Encounter (Signed)
lvm for pt regarding to 2.5.15 appt.... °

## 2013-06-01 NOTE — Progress Notes (Signed)
   Subjective:    Patient ID: Meredith Rodriguez, female    DOB: 12-26-1925, 80 y.o.   MRN: [id number]  HPI Patient here for followup regarding type 2 diabetes. She was here last week with some diarrhea and now is having constipation issues. She is having poor appetite and very little intake. She did not take any Imodium or other antidiarrheal medications.  She has chronic anemia with possible myelodysplastic syndrome followed by hematology. Type 2 diabetes on insulin. She takes Lantus 10 units daily. She had been on a regimen of Humalog 4 units twice daily with meals but we held last week because of very poor intake. Last hemoglobin A1c 8.8% in October. Recent fasting blood sugars vary considerably generally between 150 and 200. No recent hypoglycemia. Last eye exam last May.  She has some generalized fatigue but denies any orthostasis or dizziness. Good fluid intake. Hypertension treated with amlodipine and metoprolol and stable.  Past Medical History  Diagnosis Date  . Hypertension   . Diabetes mellitus   . Hyperkalemia   . IBS (irritable bowel syndrome)   . Hypercholesterolemia   . Cervical radiculopathy   . Anemia   . Cancer     leukemia  . Leukemia    Past Surgical History  Procedure Laterality Date  . Varicose veins      surgey 1959  . Tonsilectomy, adenoidectomy, bilateral myringotomy and tubes    . Esophagogastroduodenoscopy  04/01/2011    Procedure: ESOPHAGOGASTRODUODENOSCOPY (EGD);  Surgeon: Missy Sabins, MD;  Location: [facility] ENDOSCOPY;  Service: Endoscopy;  Laterality: N/A;    reports that she has never smoked. She has never used smokeless tobacco. She reports that she does not drink alcohol or use illicit drugs. family history includes Cancer in her daughter, sister, sister, and sister; Diabetes in her sister; Hypertension in her father; Stroke in her mother. Allergies  Allergen Reactions  . Amoxicillin [Amoxicillin]     Upset stomach  . Cephalexin Nausea Only  . Metformin  And Related     Gi problems  . Phenergan [Promethazine Hcl] Other (See Comments)    Unknown   . Procardia [Nifedipine]     dizziness      Review of Systems  Constitutional: Positive for fatigue. Negative for fever and chills.  Eyes: Negative for visual disturbance.  Respiratory: Negative for cough, chest tightness, shortness of breath and wheezing.   Cardiovascular: Negative for chest pain, palpitations and leg swelling.  Endocrine: Negative for polydipsia and polyuria.  Genitourinary: Negative for dysuria.  Neurological: Negative for dizziness, seizures, syncope, weakness, light-headedness and headaches.       Objective:   Physical Exam  Constitutional: She appears well-developed and well-nourished.  Neck: Neck supple. No thyromegaly present.  Cardiovascular: Normal rate and regular rhythm.   Pulmonary/Chest: Effort normal and breath sounds normal. No respiratory distress. She has no wheezes. She has no rales.  Musculoskeletal: She exhibits no edema.  Skin:  Nailbeds are somewhat pale  Psychiatric: She has a normal mood and affect. Her behavior is normal.          Assessment & Plan:  #1 type 2 diabetes. History of poor control. Recheck A1c. Continue slow titration of Lantus if blood sugars remain poorly controlled. Consider adding back low-dose Humalog as her intake increases.  Given her age and multiple comorbidities, we discussed goal A1c of less than 8 #2 hypertension. Stable. No recent orthostasis. Continue current medications

## 2013-06-01 NOTE — Progress Notes (Signed)
Pre visit review using our clinic review tool, if applicable. No additional management support is needed unless otherwise documented below in the visit note. 

## 2013-06-02 ENCOUNTER — Telehealth: Payer: Self-pay | Admitting: Family Medicine

## 2013-06-02 ENCOUNTER — Other Ambulatory Visit: Payer: Self-pay | Admitting: Oncology

## 2013-06-02 ENCOUNTER — Encounter: Payer: Self-pay | Admitting: Oncology

## 2013-06-02 NOTE — Telephone Encounter (Signed)
Relevant patient education mailed to patient.  

## 2013-06-02 NOTE — Progress Notes (Signed)
Called and spoke with [facility]. The patient was approved for Exjade..  She has new balance 5000.00---there was addl money approved 09/23/12-12/21/13.  I will send to billing and medical records.

## 2013-06-02 NOTE — Telephone Encounter (Signed)
Patient not seen since 2012. Was not sure if this should be sent to pcp. Please advise. Thanks, MI

## 2013-06-03 ENCOUNTER — Ambulatory Visit (HOSPITAL_BASED_OUTPATIENT_CLINIC_OR_DEPARTMENT_OTHER): Payer: Medicare Other

## 2013-06-03 ENCOUNTER — Encounter (INDEPENDENT_AMBULATORY_CARE_PROVIDER_SITE_OTHER): Payer: Self-pay

## 2013-06-03 ENCOUNTER — Other Ambulatory Visit (HOSPITAL_BASED_OUTPATIENT_CLINIC_OR_DEPARTMENT_OTHER): Payer: Medicare Other

## 2013-06-03 ENCOUNTER — Other Ambulatory Visit: Payer: Self-pay | Admitting: Adult Health

## 2013-06-03 VITALS — BP 139/65 | HR 67 | Temp 97.9°F | Resp 18

## 2013-06-03 DIAGNOSIS — D630 Anemia in neoplastic disease: Secondary | ICD-10-CM

## 2013-06-03 DIAGNOSIS — D649 Anemia, unspecified: Secondary | ICD-10-CM

## 2013-06-03 DIAGNOSIS — D469 Myelodysplastic syndrome, unspecified: Secondary | ICD-10-CM

## 2013-06-03 LAB — CBC WITH DIFFERENTIAL/PLATELET
BASO%: 0.2 % (ref 0.0–2.0)
Basophils Absolute: 0 10*3/uL (ref 0.0–0.1)
EOS%: 0.2 % (ref 0.0–7.0)
Eosinophils Absolute: 0 10*3/uL (ref 0.0–0.5)
HEMATOCRIT: 22.1 % — AB (ref 34.8–46.6)
HGB: 7.3 g/dL — ABNORMAL LOW (ref 11.6–15.9)
LYMPH%: 28.2 % (ref 14.0–49.7)
MCH: 28.2 pg (ref 25.1–34.0)
MCHC: 33 g/dL (ref 31.5–36.0)
MCV: 85.3 fL (ref 79.5–101.0)
MONO#: 0 10*3/uL — ABNORMAL LOW (ref 0.1–0.9)
MONO%: 0.6 % (ref 0.0–14.0)
NEUT#: 3.4 10*3/uL (ref 1.5–6.5)
NEUT%: 70.8 % (ref 38.4–76.8)
NRBC: 0 % (ref 0–0)
RBC: 2.59 10*6/uL — AB (ref 3.70–5.45)
RDW: 14.6 % — ABNORMAL HIGH (ref 11.2–14.5)
WBC: 4.9 10*3/uL (ref 3.9–10.3)
lymph#: 1.4 10*3/uL (ref 0.9–3.3)

## 2013-06-03 LAB — HOLD TUBE, BLOOD BANK

## 2013-06-03 LAB — TECHNOLOGIST REVIEW

## 2013-06-03 MED ORDER — DIPHENHYDRAMINE HCL 25 MG PO CAPS
25.0000 mg | ORAL_CAPSULE | Freq: Once | ORAL | Status: AC
  Administered 2013-06-03: 25 mg via ORAL

## 2013-06-03 MED ORDER — SODIUM CHLORIDE 0.9 % IJ SOLN
10.0000 mL | INTRAMUSCULAR | Status: AC | PRN
  Administered 2013-06-03: 10 mL
  Filled 2013-06-03: qty 10

## 2013-06-03 MED ORDER — ACETAMINOPHEN 325 MG PO TABS
ORAL_TABLET | ORAL | Status: AC
  Filled 2013-06-03: qty 2

## 2013-06-03 MED ORDER — DIPHENHYDRAMINE HCL 25 MG PO CAPS
ORAL_CAPSULE | ORAL | Status: AC
  Filled 2013-06-03: qty 1

## 2013-06-03 MED ORDER — SODIUM CHLORIDE 0.9 % IV SOLN
250.0000 mL | Freq: Once | INTRAVENOUS | Status: AC
  Administered 2013-06-03: 250 mL via INTRAVENOUS

## 2013-06-03 MED ORDER — HEPARIN SOD (PORK) LOCK FLUSH 100 UNIT/ML IV SOLN
500.0000 [IU] | Freq: Every day | INTRAVENOUS | Status: AC | PRN
  Administered 2013-06-03: 500 [IU]
  Filled 2013-06-03: qty 5

## 2013-06-03 MED ORDER — ACETAMINOPHEN 325 MG PO TABS
650.0000 mg | ORAL_TABLET | Freq: Once | ORAL | Status: AC
  Administered 2013-06-03: 650 mg via ORAL

## 2013-06-03 NOTE — Patient Instructions (Signed)
Blood Transfusion Information WHAT IS A BLOOD TRANSFUSION? A transfusion is the replacement of blood or some of its parts. Blood is made up of multiple cells which provide different functions.  Red blood cells carry oxygen and are used for blood loss replacement.  White blood cells fight against infection.  Platelets control bleeding.  Plasma helps clot blood.  Other blood products are available for specialized needs, such as hemophilia or other clotting disorders. BEFORE THE TRANSFUSION  Who gives blood for transfusions?   You may be able to donate blood to be used at a later date on yourself (autologous donation).  Relatives can be asked to donate blood. This is generally not any safer than if you have received blood from a stranger. The same precautions are taken to ensure safety when a relative's blood is donated.  Healthy volunteers who are fully evaluated to make sure their blood is safe. This is blood bank blood. Transfusion therapy is the safest it has ever been in the practice of medicine. Before blood is taken from a donor, a complete history is taken to make sure that person has no history of diseases nor engages in risky social behavior (examples are intravenous drug use or sexual activity with multiple partners). The donor's travel history is screened to minimize risk of transmitting infections, such as malaria. The donated blood is tested for signs of infectious diseases, such as HIV and hepatitis. The blood is then tested to be sure it is compatible with you in order to minimize the chance of a transfusion reaction. If you or a relative donates blood, this is often done in anticipation of surgery and is not appropriate for emergency situations. It takes many days to process the donated blood. RISKS AND COMPLICATIONS Although transfusion therapy is very safe and saves many lives, the main dangers of transfusion include:   Getting an infectious disease.  Developing a  transfusion reaction. This is an allergic reaction to something in the blood you were given. Every precaution is taken to prevent this. The decision to have a blood transfusion has been considered carefully by your caregiver before blood is given. Blood is not given unless the benefits outweigh the risks. AFTER THE TRANSFUSION  Right after receiving a blood transfusion, you will usually feel much better and more energetic. This is especially true if your red blood cells have gotten low (anemic). The transfusion raises the level of the red blood cells which carry oxygen, and this usually causes an energy increase.  The nurse administering the transfusion will monitor you carefully for complications. HOME CARE INSTRUCTIONS  No special instructions are needed after a transfusion. You may find your energy is better. Speak with your caregiver about any limitations on activity for underlying diseases you may have. SEEK MEDICAL CARE IF:   Your condition is not improving after your transfusion.  You develop redness or irritation at the intravenous (IV) site. SEEK IMMEDIATE MEDICAL CARE IF:  Any of the following symptoms occur over the next 12 hours:  Shaking chills.  You have a temperature by mouth above 102 F (38.9 C), not controlled by medicine.  Chest, back, or muscle pain.  People around you feel you are not acting correctly or are confused.  Shortness of breath or difficulty breathing.  Dizziness and fainting.  You get a rash or develop hives.  You have a decrease in urine output.  Your urine turns a dark color or changes to pink, red, or brown. Any of the following   symptoms occur over the next 10 days:  You have a temperature by mouth above 102 F (38.9 C), not controlled by medicine.  Shortness of breath.  Weakness after normal activity.  The white part of the eye turns yellow (jaundice).  You have a decrease in the amount of urine or are urinating less often.  Your  urine turns a dark color or changes to pink, red, or brown. Document Released: 04/12/2000 Document Revised: 07/08/2011 Document Reviewed: 11/30/2007 [employer] Patient Information 2014 [employer].  

## 2013-06-04 ENCOUNTER — Telehealth: Payer: Self-pay

## 2013-06-04 ENCOUNTER — Ambulatory Visit (INDEPENDENT_AMBULATORY_CARE_PROVIDER_SITE_OTHER): Payer: Medicare Other | Admitting: Family Medicine

## 2013-06-04 ENCOUNTER — Other Ambulatory Visit: Payer: Self-pay | Admitting: Oncology

## 2013-06-04 DIAGNOSIS — Z23 Encounter for immunization: Secondary | ICD-10-CM

## 2013-06-04 LAB — TYPE AND SCREEN
ABO/RH(D): AB NEG
ANTIBODY SCREEN: NEGATIVE
Donor AG Type: NEGATIVE
Donor AG Type: NEGATIVE
UNIT DIVISION: 0
Unit division: 0

## 2013-06-04 NOTE — Telephone Encounter (Signed)
Relevant patient education mailed to patient.  

## 2013-06-05 ENCOUNTER — Other Ambulatory Visit: Payer: Self-pay | Admitting: Oncology

## 2013-06-07 ENCOUNTER — Other Ambulatory Visit: Payer: Medicare Other

## 2013-06-09 ENCOUNTER — Telehealth: Payer: Self-pay | Admitting: Family Medicine

## 2013-06-09 MED ORDER — METOPROLOL TARTRATE 50 MG PO TABS: 50.0000 mg | ORAL_TABLET | Freq: Two times a day (BID) | ORAL | Status: DC

## 2013-06-09 NOTE — Telephone Encounter (Signed)
Pt is needing new rx metoprolol (LOPRESSOR) 50 MG tablet, sent to [facility] on  [address], [address]. Pt states she has been out of meds for about  4days, please call and advise when to pick up.

## 2013-06-09 NOTE — Telephone Encounter (Signed)
RX sent to pharmacy  

## 2013-06-14 ENCOUNTER — Ambulatory Visit (HOSPITAL_BASED_OUTPATIENT_CLINIC_OR_DEPARTMENT_OTHER): Payer: Medicare Other | Admitting: Hematology and Oncology

## 2013-06-14 ENCOUNTER — Encounter (INDEPENDENT_AMBULATORY_CARE_PROVIDER_SITE_OTHER): Payer: Self-pay

## 2013-06-14 ENCOUNTER — Telehealth: Payer: Self-pay | Admitting: Oncology

## 2013-06-14 ENCOUNTER — Other Ambulatory Visit (HOSPITAL_BASED_OUTPATIENT_CLINIC_OR_DEPARTMENT_OTHER): Payer: Medicare Other

## 2013-06-14 VITALS — BP 134/71 | HR 72 | Temp 98.1°F | Resp 18 | Ht 66.0 in | Wt 106.2 lb

## 2013-06-14 DIAGNOSIS — C9200 Acute myeloblastic leukemia, not having achieved remission: Secondary | ICD-10-CM

## 2013-06-14 DIAGNOSIS — D469 Myelodysplastic syndrome, unspecified: Secondary | ICD-10-CM

## 2013-06-14 LAB — CBC WITH DIFFERENTIAL/PLATELET
BASO%: 0.2 % (ref 0.0–2.0)
BASOS ABS: 0 10*3/uL (ref 0.0–0.1)
EOS%: 0.7 % (ref 0.0–7.0)
Eosinophils Absolute: 0 10*3/uL (ref 0.0–0.5)
HEMATOCRIT: 32.6 % — AB (ref 34.8–46.6)
HEMOGLOBIN: 10.7 g/dL — AB (ref 11.6–15.9)
LYMPH#: 1.4 10*3/uL (ref 0.9–3.3)
LYMPH%: 30.9 % (ref 14.0–49.7)
MCH: 28.7 pg (ref 25.1–34.0)
MCHC: 32.8 g/dL (ref 31.5–36.0)
MCV: 87.4 fL (ref 79.5–101.0)
MONO#: 0.1 10*3/uL (ref 0.1–0.9)
MONO%: 2.5 % (ref 0.0–14.0)
NEUT#: 2.9 10*3/uL (ref 1.5–6.5)
NEUT%: 65.7 % (ref 38.4–76.8)
Platelets: 134 10*3/uL — ABNORMAL LOW (ref 145–400)
RBC: 3.73 10*6/uL (ref 3.70–5.45)
RDW: 14.9 % — ABNORMAL HIGH (ref 11.2–14.5)
WBC: 4.4 10*3/uL (ref 3.9–10.3)
nRBC: 0 % (ref 0–0)

## 2013-06-14 LAB — LACTATE DEHYDROGENASE (CC13): LDH: 338 U/L — ABNORMAL HIGH (ref 125–245)

## 2013-06-14 LAB — COMPREHENSIVE METABOLIC PANEL (CC13)
ALT: 26 U/L (ref 0–55)
AST: 27 U/L (ref 5–34)
Albumin: 3.8 g/dL (ref 3.5–5.0)
Alkaline Phosphatase: 74 U/L (ref 40–150)
Anion Gap: 8 mEq/L (ref 3–11)
BUN: 24.4 mg/dL (ref 7.0–26.0)
CHLORIDE: 104 meq/L (ref 98–109)
CO2: 25 mEq/L (ref 22–29)
Calcium: 10.2 mg/dL (ref 8.4–10.4)
Creatinine: 1.1 mg/dL (ref 0.6–1.1)
GLUCOSE: 126 mg/dL (ref 70–140)
Potassium: 4.4 mEq/L (ref 3.5–5.1)
Sodium: 137 mEq/L (ref 136–145)
Total Bilirubin: 0.64 mg/dL (ref 0.20–1.20)
Total Protein: 7 g/dL (ref 6.4–8.3)

## 2013-06-14 LAB — TECHNOLOGIST REVIEW

## 2013-06-14 MED ORDER — DEFERASIROX 500 MG PO TBSO: 1000.0000 mg | ORAL_TABLET | Freq: Every day | ORAL | Status: DC

## 2013-06-14 NOTE — Progress Notes (Signed)
OFFICE PROGRESS NOTE  [place], MD [place] [address]  DIAGNOSIS: 80 year old female with severe and profound anemia due to a primary bone marrow problem-MDS or ?AML  CURRENT THERAPY:Supportive care  PRIOR THERAPY:  #1. She was on Procrit every 2 weeks unfortunately this was not effective and discontinued   #3 patient is transfusion dependent anemia in the setting of myelodysplastic syndrome cannot exclude conversion to an acute leukemia.  #4 Patient received 3 cycles of Decitabine chemotherapy, her third cycle was complicated by febrile neutropenia and pneumonia causing hospitalization.  She is at present on supportive therapy   INTERVAL HISTORY: Meredith Rodriguez 80 y.o. female returns for follow up today for her MDS/?AML . She received blood transfusion last week when her hemoglobin was 7.3. Today her hemoglobin is 10.7 hematocrit is 32.6 and she feels much better. She is on Exjade 500 mg once daily  for her iron overload her last iron indices were performed on 05/24/2013 and that revealed a ferritin of 4962. She complains of mild fatigue,  other than that denis any shortness of breath. she says that she's eating well.  Recent mammogram performed on 05/18/2013 revealed no mammographic evidence of any malignancy. She denies any fever, shortness of breath, chest pains, palpitations, headaches, blurred vision, blood in the stool or blood in the urine. she does complain of incomplete evacuation of stool   MEDICAL HISTORY: Past Medical History  Diagnosis Date  . Hypertension   . Diabetes mellitus   . Hyperkalemia   . IBS (irritable bowel syndrome)   . Hypercholesterolemia   . Cervical radiculopathy   . Anemia   . Cancer     leukemia  . Leukemia     ALLERGIES:  is allergic to amoxicillin; cephalexin; metformin and related; phenergan; and procardia.  MEDICATIONS:  Current Outpatient Prescriptions  Medication Sig Dispense Refill  . amLODipine  (NORVASC) 10 MG tablet Take 1 tablet (10 mg total) by mouth daily.  90 tablet  3  . aspirin 81 MG tablet Take 81 mg by mouth daily.      . cholecalciferol (VITAMIN D) 1000 UNITS tablet Take 1,000 Units by mouth daily.      Marland Kitchen deferasirox (EXJADE) 500 MG disintegrating tablet Take 2 tablets (1,000 mg total) by mouth daily before breakfast.  60 tablet  0  . FREESTYLE LITE test strip USE TO TEST BLOOD SUGAR 4 TIMES DAILY AS DIRECTED  30 each  10  . insulin lispro (HUMALOG KWIKPEN) 100 UNIT/ML SOPN Inject 4 Units into the skin 2 (two) times daily with a meal. Inject 4 units twice daily into skin if blood glucose is over 100 at lunch and dinner      . Lancets (FREESTYLE) lancets 1 each by Other route 2 (two) times daily.       Marland Kitchen LANTUS SOLOSTAR 100 UNIT/ML SOPN INJECT 10 UNITS INTO THE SKIN DAILY  15 mL  3  . metoprolol (LOPRESSOR) 50 MG tablet Take 1 tablet (50 mg total) by mouth 2 (two) times daily.  180 tablet  3  . Omega-3 Fatty Acids (FISH OIL) 1000 MG CAPS Take 1,000 mg by mouth daily.       . simvastatin (ZOCOR) 5 MG tablet Take 5 mg by mouth at bedtime.      . lidocaine-prilocaine (EMLA) cream Apply topically as needed.  30 g  4  . [DISCONTINUED] glimepiride (AMARYL) 2 MG tablet Take 1 tablet (2 mg total) by mouth every morning.  30 tablet  11   Current Facility-Administered Medications  Medication Dose Route Frequency Provider Last Rate Last Dose  . TDaP (BOOSTRIX) injection 0.5 mL  0.5 mL Intramuscular Once [name], MD        SURGICAL HISTORY:  Past Surgical History  Procedure Laterality Date  . Varicose veins      surgey 1959  . Tonsilectomy, adenoidectomy, bilateral myringotomy and tubes    . Esophagogastroduodenoscopy  04/01/2011    Procedure: ESOPHAGOGASTRODUODENOSCOPY (EGD);  Surgeon: Missy Sabins, MD;  Location: [facility] ENDOSCOPY;  Service: Endoscopy;  Laterality: N/A;    REVIEW OF SYSTEMS:   A 14 point review of systems was conducted and is otherwise negative except for  what is noted above.      PHYSICAL EXAMINATION: BP 134/71  Pulse 72  Temp(Src) 98.1 F (36.7 C) (Oral)  Resp 18  Ht 5\' 6"  (1.676 m)  Wt 106 lb 3.2 oz (48.172 kg)  BMI 17.15 kg/m2 General: Patient is a well appearing female in no acute distress HEENT: PERRLA, sclerae anicteric no conjunctival pallor, MMM no erythema or exudate Neck: supple, no lymphadenopathy Breast exam: No masses felt Lungs: clear to auscultation bilaterally, no wheezes, rhonchi, or rales Cardiovascular: regular rate rhythm, S1, S2, no murmurs, rubs or gallops Abdomen: Soft, non-tender, non-distended, normoactive bowel sounds, no HSM Extremities: warm and well perfused, no clubbing, cyanosis, or edema Skin: No rashes.    Neuro: Non-focal ECOG PERFORMANCE STATUS: 1  LABORATORY DATA: Lab Results  Component Value Date   WBC 4.4 06/14/2013   HGB 10.7* 06/14/2013   HCT 32.6* 06/14/2013   MCV 87.4 06/14/2013   PLT 134* 06/14/2013      Chemistry      Component Value Date/Time   NA 135 02/10/2013 0620   NA 137 01/25/2013 0834   K 4.1 02/10/2013 0620   K 4.5 01/25/2013 0834   CL 101 02/10/2013 0620   CL 99 06/01/2012 1028   CO2 27 02/10/2013 0620   CO2 29 01/25/2013 0834   BUN 15 02/10/2013 0620   BUN 13.5 01/25/2013 0834   CREATININE 0.62 02/10/2013 0620   CREATININE 0.8 01/25/2013 0834   CREATININE 0.69 07/18/2010 1202   GLU 215* 02/26/2012 1255      Component Value Date/Time   CALCIUM 8.4 02/10/2013 0620   CALCIUM 9.4 01/25/2013 0834   ALKPHOS 55 02/07/2013 2047   ALKPHOS 58 01/25/2013 0834   AST 33 02/07/2013 2047   AST 24 01/25/2013 0834   ALT 34 02/07/2013 2047   ALT 30 01/25/2013 0834   BILITOT 0.9 02/07/2013 2047   BILITOT 1.14 01/25/2013 0834       RADIOGRAPHIC STUDIES:  No results found.  ASSESSMENT/PLAN: 80 year old female with:  1.  MDS with refractory  anemia not responsive to Procrit   2. Patient was started on Decitabine chemotherapy.  She received three cycles of this, and then had an  episode of febrile neutropenia resulting in pneumonia and hospitalization.  Her chemotherapy has been discontinued due to complications of febrile neutropenia and pneumonia. Continue supportive therapy and as needed blood transfusions. Her hemoglobin is 10.7 g/dl. and hematocrit today is 32.6% and she does not need blood transfusion today. Monotor weekly CBC and diff as scheduled  3. Iron overload:  we'll increase the dose of Exjade to 1000 mg  by mouth once daily  with close monitoring of liver functions. Repeat  ferritin level and liver functions in one month.   4. she is up-to-date with  her flu vaccine and a pneumonia vaccine  5.bilateral breast screening mammogram  revealed no mammographic evidence of malignancy   6 I have asked the patient to followup with her primary care physician for further evaluation of change in bowel habits(feels like incomplete evacuation of stool).    All questions were answered. The patient knows to call the clinic with any problems, questions or concerns. We can certainly see the patient much sooner if necessary.  I spent 20 minutes counseling the patient face to face. The total time spent in the appointment was 30 minutes.    Wilmon Arms , MD Medical/Oncology [facility] [phone] (beeper) [phone] (Office)

## 2013-06-15 ENCOUNTER — Other Ambulatory Visit: Payer: Self-pay | Admitting: *Deleted

## 2013-06-15 DIAGNOSIS — C9200 Acute myeloblastic leukemia, not having achieved remission: Secondary | ICD-10-CM

## 2013-06-15 DIAGNOSIS — D469 Myelodysplastic syndrome, unspecified: Secondary | ICD-10-CM

## 2013-06-15 MED ORDER — DEFERASIROX 500 MG PO TBSO: 1000.0000 mg | ORAL_TABLET | Freq: Every day | ORAL | Status: DC

## 2013-06-15 NOTE — Telephone Encounter (Signed)
Received call from patient's daughter, Reginold Agent, that they would like the prescription for Exjade to be filled at [place] because there is an assistance program there. I e-scribed the prescription to [place]. Daughter verbalized understanding.

## 2013-06-18 ENCOUNTER — Other Ambulatory Visit: Payer: Self-pay | Admitting: Hematology and Oncology

## 2013-06-21 ENCOUNTER — Encounter (INDEPENDENT_AMBULATORY_CARE_PROVIDER_SITE_OTHER): Payer: Self-pay

## 2013-06-21 ENCOUNTER — Other Ambulatory Visit (HOSPITAL_BASED_OUTPATIENT_CLINIC_OR_DEPARTMENT_OTHER): Payer: Medicare Other

## 2013-06-21 ENCOUNTER — Other Ambulatory Visit: Payer: Self-pay | Admitting: Adult Health

## 2013-06-21 DIAGNOSIS — D469 Myelodysplastic syndrome, unspecified: Secondary | ICD-10-CM

## 2013-06-21 DIAGNOSIS — C9200 Acute myeloblastic leukemia, not having achieved remission: Secondary | ICD-10-CM

## 2013-06-21 LAB — COMPREHENSIVE METABOLIC PANEL (CC13)
ALT: 20 U/L (ref 0–55)
ANION GAP: 9 meq/L (ref 3–11)
AST: 21 U/L (ref 5–34)
Albumin: 3.6 g/dL (ref 3.5–5.0)
Alkaline Phosphatase: 68 U/L (ref 40–150)
BILIRUBIN TOTAL: 0.7 mg/dL (ref 0.20–1.20)
BUN: 22.2 mg/dL (ref 7.0–26.0)
CHLORIDE: 105 meq/L (ref 98–109)
CO2: 24 meq/L (ref 22–29)
CREATININE: 1.4 mg/dL — AB (ref 0.6–1.1)
Calcium: 9.7 mg/dL (ref 8.4–10.4)
GLUCOSE: 222 mg/dL — AB (ref 70–140)
Potassium: 4 mEq/L (ref 3.5–5.1)
Sodium: 138 mEq/L (ref 136–145)
TOTAL PROTEIN: 6.7 g/dL (ref 6.4–8.3)

## 2013-06-21 LAB — CBC WITH DIFFERENTIAL/PLATELET
BASO%: 0.3 % (ref 0.0–2.0)
Basophils Absolute: 0 10*3/uL (ref 0.0–0.1)
EOS ABS: 0 10*3/uL (ref 0.0–0.5)
EOS%: 0.5 % (ref 0.0–7.0)
HCT: 29 % — ABNORMAL LOW (ref 34.8–46.6)
HGB: 9.4 g/dL — ABNORMAL LOW (ref 11.6–15.9)
LYMPH%: 22.8 % (ref 14.0–49.7)
MCH: 28.5 pg (ref 25.1–34.0)
MCHC: 32.4 g/dL (ref 31.5–36.0)
MCV: 87.9 fL (ref 79.5–101.0)
MONO#: 0.1 10*3/uL (ref 0.1–0.9)
MONO%: 3 % (ref 0.0–14.0)
NEUT#: 2.7 10*3/uL (ref 1.5–6.5)
NEUT%: 73.4 % (ref 38.4–76.8)
Platelets: 138 10*3/uL — ABNORMAL LOW (ref 145–400)
RBC: 3.3 10*6/uL — ABNORMAL LOW (ref 3.70–5.45)
RDW: 15.4 % — ABNORMAL HIGH (ref 11.2–14.5)
WBC: 3.7 10*3/uL — ABNORMAL LOW (ref 3.9–10.3)
lymph#: 0.8 10*3/uL — ABNORMAL LOW (ref 0.9–3.3)
nRBC: 1 % — ABNORMAL HIGH (ref 0–0)

## 2013-06-21 LAB — TECHNOLOGIST REVIEW

## 2013-06-21 LAB — FERRITIN CHCC

## 2013-06-25 ENCOUNTER — Other Ambulatory Visit: Payer: Self-pay | Admitting: Internal Medicine

## 2013-06-25 DIAGNOSIS — D469 Myelodysplastic syndrome, unspecified: Secondary | ICD-10-CM

## 2013-06-28 ENCOUNTER — Telehealth: Payer: Self-pay | Admitting: Adult Health

## 2013-06-28 ENCOUNTER — Ambulatory Visit (HOSPITAL_BASED_OUTPATIENT_CLINIC_OR_DEPARTMENT_OTHER): Payer: Medicare Other | Admitting: Adult Health

## 2013-06-28 ENCOUNTER — Encounter: Payer: Self-pay | Admitting: Adult Health

## 2013-06-28 ENCOUNTER — Ambulatory Visit (HOSPITAL_COMMUNITY)
Admission: RE | Admit: 2013-06-28 | Discharge: 2013-06-28 | Disposition: A | Discharge status: Home / Self Care | Payer: Medicare Other | Source: Ambulatory Visit | Attending: Oncology | Admitting: Oncology

## 2013-06-28 ENCOUNTER — Ambulatory Visit (HOSPITAL_BASED_OUTPATIENT_CLINIC_OR_DEPARTMENT_OTHER): Payer: Medicare Other

## 2013-06-28 ENCOUNTER — Other Ambulatory Visit (HOSPITAL_BASED_OUTPATIENT_CLINIC_OR_DEPARTMENT_OTHER): Payer: Medicare Other

## 2013-06-28 ENCOUNTER — Telehealth: Payer: Self-pay | Admitting: Family Medicine

## 2013-06-28 VITALS — BP 134/57 | HR 84 | Temp 98.1°F | Resp 18 | Ht 66.0 in | Wt 108.1 lb

## 2013-06-28 DIAGNOSIS — D630 Anemia in neoplastic disease: Secondary | ICD-10-CM

## 2013-06-28 DIAGNOSIS — R51 Headache: Secondary | ICD-10-CM

## 2013-06-28 DIAGNOSIS — D469 Myelodysplastic syndrome, unspecified: Secondary | ICD-10-CM | POA: Insufficient documentation

## 2013-06-28 DIAGNOSIS — C9200 Acute myeloblastic leukemia, not having achieved remission: Secondary | ICD-10-CM

## 2013-06-28 DIAGNOSIS — R5381 Other malaise: Secondary | ICD-10-CM

## 2013-06-28 DIAGNOSIS — D4620 Refractory anemia with excess of blasts, unspecified: Secondary | ICD-10-CM

## 2013-06-28 DIAGNOSIS — R300 Dysuria: Secondary | ICD-10-CM

## 2013-06-28 DIAGNOSIS — R5383 Other fatigue: Secondary | ICD-10-CM

## 2013-06-28 DIAGNOSIS — D649 Anemia, unspecified: Secondary | ICD-10-CM

## 2013-06-28 LAB — URINALYSIS, MICROSCOPIC - CHCC
BILIRUBIN (URINE): NEGATIVE
Glucose: 1000 mg/dL
KETONES: NEGATIVE mg/dL
NITRITE: NEGATIVE
Protein: 30 mg/dL
Specific Gravity, Urine: 1.01 (ref 1.003–1.035)
Urobilinogen, UR: 0.2 mg/dL (ref 0.2–1)
pH: 6 (ref 4.6–8.0)

## 2013-06-28 LAB — COMPREHENSIVE METABOLIC PANEL (CC13)
ALBUMIN: 3.7 g/dL (ref 3.5–5.0)
ALT: 18 U/L (ref 0–55)
AST: 21 U/L (ref 5–34)
Alkaline Phosphatase: 74 U/L (ref 40–150)
Anion Gap: 7 mEq/L (ref 3–11)
BUN: 22.2 mg/dL (ref 7.0–26.0)
CALCIUM: 9.1 mg/dL (ref 8.4–10.4)
CHLORIDE: 106 meq/L (ref 98–109)
CO2: 23 mEq/L (ref 22–29)
Creatinine: 1.2 mg/dL — ABNORMAL HIGH (ref 0.6–1.1)
Glucose: 246 mg/dl — ABNORMAL HIGH (ref 70–140)
POTASSIUM: 4.5 meq/L (ref 3.5–5.1)
Sodium: 136 mEq/L (ref 136–145)
TOTAL PROTEIN: 6.5 g/dL (ref 6.4–8.3)
Total Bilirubin: 0.68 mg/dL (ref 0.20–1.20)

## 2013-06-28 LAB — CBC WITH DIFFERENTIAL/PLATELET
BASO%: 0.2 % (ref 0.0–2.0)
BASOS ABS: 0 10*3/uL (ref 0.0–0.1)
EOS ABS: 0 10*3/uL (ref 0.0–0.5)
EOS%: 0.6 % (ref 0.0–7.0)
HCT: 23.9 % — ABNORMAL LOW (ref 34.8–46.6)
HEMOGLOBIN: 7.7 g/dL — AB (ref 11.6–15.9)
LYMPH%: 20.4 % (ref 14.0–49.7)
MCH: 27.8 pg (ref 25.1–34.0)
MCHC: 32.2 g/dL (ref 31.5–36.0)
MCV: 86.3 fL (ref 79.5–101.0)
MONO#: 0.2 10*3/uL (ref 0.1–0.9)
MONO%: 4.3 % (ref 0.0–14.0)
NEUT%: 74.5 % (ref 38.4–76.8)
NEUTROS ABS: 3.7 10*3/uL (ref 1.5–6.5)
RBC: 2.77 10*6/uL — ABNORMAL LOW (ref 3.70–5.45)
RDW: 15.5 % — AB (ref 11.2–14.5)
WBC: 4.9 10*3/uL (ref 3.9–10.3)
lymph#: 1 10*3/uL (ref 0.9–3.3)
nRBC: 0 % (ref 0–0)

## 2013-06-28 LAB — FERRITIN CHCC: Ferritin: 4165 ng/ml — ABNORMAL HIGH (ref 9–269)

## 2013-06-28 LAB — PREPARE RBC (CROSSMATCH)

## 2013-06-28 LAB — TECHNOLOGIST REVIEW: Technologist Review: 2

## 2013-06-28 MED ORDER — CIPROFLOXACIN HCL 250 MG PO TABS: 250.0000 mg | ORAL_TABLET | Freq: Two times a day (BID) | ORAL | Status: DC

## 2013-06-28 MED ORDER — ONETOUCH DELICA LANCETS FINE MISC
Status: DC
Start: 2013-06-28 — End: 2013-08-25

## 2013-06-28 MED ORDER — GLUCOSE BLOOD VI STRP: ORAL_STRIP | Status: DC

## 2013-06-28 NOTE — Progress Notes (Signed)
OFFICE PROGRESS NOTE  Coleman, MD [place] [address]  DIAGNOSIS: 80 year old female with severe and profound anemia due to a primary bone marrow problem-MDS or ?AML  CURRENT THERAPY:Supportive care  PRIOR THERAPY:  #1. She was on Procrit every 2 weeks unfortunately this was not effective and discontinued   #3 patient is transfusion dependent anemia in the setting of myelodysplastic syndrome cannot exclude conversion to an acute leukemia.  #4 Patient received 3 cycles of Decitabine chemotherapy, her third cycle was complicated by febrile neutropenia and pneumonia causing hospitalization and Decitabine was discontinued.  She is at present on supportive therapy   INTERVAL HISTORY: Meredith Rodriguez 80 y.o. female returns for follow up today for her MDS/?AML.  She is doing moderately well today.  She is feeling moderately fatigued today.  She denies fevers, chills, night sweats.  She does c/o dysuria.  This has been going on for 2 days.  She has no blood in her urine, urinary hesitancy, back pain, or further concerns.  She has a slight headache that started 2 days ago, she has had similar headaches like this in the past.  She denies vision changes, strength changes, slurred speech, and weakness.  She is anemic.  She wants to know what medication if any she can take for her headache.  Otherwise, she is doing well, and has no other questions or concerns.     MEDICAL HISTORY: Past Medical History  Diagnosis Date  . Hypertension   . Diabetes mellitus   . Hyperkalemia   . IBS (irritable bowel syndrome)   . Hypercholesterolemia   . Cervical radiculopathy   . Anemia   . Cancer     leukemia  . Leukemia     ALLERGIES:  is allergic to amoxicillin; cephalexin; metformin and related; phenergan; and procardia.  MEDICATIONS:  Current Outpatient Prescriptions  Medication Sig Dispense Refill  . amLODipine (NORVASC) 10 MG tablet Take 1 tablet (10 mg total) by  mouth daily.  90 tablet  3  . aspirin 81 MG tablet Take 81 mg by mouth daily.      . cholecalciferol (VITAMIN D) 1000 UNITS tablet Take 1,000 Units by mouth daily.      Marland Kitchen deferasirox (EXJADE) 500 MG disintegrating tablet Take 2 tablets (1,000 mg total) by mouth daily before breakfast.  60 tablet  0  . FREESTYLE LITE test strip USE TO TEST BLOOD SUGAR 4 TIMES DAILY AS DIRECTED  30 each  10  . insulin lispro (HUMALOG KWIKPEN) 100 UNIT/ML SOPN Inject 4 Units into the skin 2 (two) times daily with a meal. Inject 4 units twice daily into skin if blood glucose is over 100 at lunch and dinner      . Lancets (FREESTYLE) lancets 1 each by Other route 2 (two) times daily.       Marland Kitchen LANTUS SOLOSTAR 100 UNIT/ML SOPN INJECT 10 UNITS INTO THE SKIN DAILY  15 mL  3  . lidocaine-prilocaine (EMLA) cream Apply topically as needed.  30 g  4  . metoprolol (LOPRESSOR) 50 MG tablet Take 1 tablet (50 mg total) by mouth 2 (two) times daily.  180 tablet  3  . Omega-3 Fatty Acids (FISH OIL) 1000 MG CAPS Take 1,000 mg by mouth daily.       . simvastatin (ZOCOR) 5 MG tablet Take 5 mg by mouth at bedtime.      . ciprofloxacin (CIPRO) 250 MG tablet Take 1 tablet (250 mg total) by  mouth 2 (two) times daily.  14 tablet  0  . glucose blood (ONETOUCH VERIO) test strip Check blood sugar 4 times daily as directed. DX. 250.00  100 each  5  . ONETOUCH DELICA LANCETS FINE MISC Use as directed. DX. 250.00  100 each  3  . [DISCONTINUED] glimepiride (AMARYL) 2 MG tablet Take 1 tablet (2 mg total) by mouth every morning.  30 tablet  11   Current Facility-Administered Medications  Medication Dose Route Frequency Provider Last Rate Last Dose  . TDaP (BOOSTRIX) injection 0.5 mL  0.5 mL Intramuscular Once [name], MD        SURGICAL HISTORY:  Past Surgical History  Procedure Laterality Date  . Varicose veins      surgey 1959  . Tonsilectomy, adenoidectomy, bilateral myringotomy and tubes    . Esophagogastroduodenoscopy   04/01/2011    Procedure: ESOPHAGOGASTRODUODENOSCOPY (EGD);  Surgeon: Missy Sabins, MD;  Location: [facility] ENDOSCOPY;  Service: Endoscopy;  Laterality: N/A;    REVIEW OF SYSTEMS:   A 10 point review of systems was conducted and is otherwise negative except for what is noted above.    PHYSICAL EXAMINATION: BP 134/57  Pulse 84  Temp(Src) 98.1 F (36.7 C) (Oral)  Resp 18  Ht 5\' 6"  (1.676 m)  Wt 108 lb 1.6 oz (49.034 kg)  BMI 17.46 kg/m2 General: Patient is a well appearing female in no acute distress HEENT: PERRLA, sclerae anicteric no conjunctival pallor, MMM no erythema or exudate Neck: supple, no lymphadenopathy Breast exam: No masses felt Lungs: clear to auscultation bilaterally, no wheezes, rhonchi, or rales Cardiovascular: regular rate rhythm, S1, S2, no murmurs, rubs or gallops Abdomen: Soft, non-tender, non-distended, normoactive bowel sounds, no HSM Extremities: warm and well perfused, no clubbing, cyanosis, or edema Skin: No rashes.    Neuro: Non-focal ECOG PERFORMANCE STATUS: 1  LABORATORY DATA: Lab Results  Component Value Date   WBC 4.9 06/28/2013   HGB 7.7* 06/28/2013   HCT 23.9* 06/28/2013   MCV 86.3 06/28/2013   PLT 140 Large & giant platelets* 06/28/2013      Chemistry      Component Value Date/Time   NA 136 06/28/2013 1002   NA 135 02/10/2013 0620   K 4.5 06/28/2013 1002   K 4.1 02/10/2013 0620   CL 101 02/10/2013 0620   CL 99 06/01/2012 1028   CO2 23 06/28/2013 1002   CO2 27 02/10/2013 0620   BUN 22.2 06/28/2013 1002   BUN 15 02/10/2013 0620   CREATININE 1.2* 06/28/2013 1002   CREATININE 0.62 02/10/2013 0620   CREATININE 0.69 07/18/2010 1202   GLU 215* 02/26/2012 1255      Component Value Date/Time   CALCIUM 9.1 06/28/2013 1002   CALCIUM 8.4 02/10/2013 0620   ALKPHOS 74 06/28/2013 1002   ALKPHOS 55 02/07/2013 2047   AST 21 06/28/2013 1002   AST 33 02/07/2013 2047   ALT 18 06/28/2013 1002   ALT 34 02/07/2013 2047   BILITOT 0.68 06/28/2013 1002   BILITOT 0.9 02/07/2013 2047        RADIOGRAPHIC STUDIES:  No results found.  ASSESSMENT/PLAN: 80 year old female with:  1.  MDS with refractory  anemia not responsive to Procrit   2. Patient was started on Decitabine chemotherapy.  She received three cycles of this, and then had an episode of febrile neutropenia resulting in pneumonia and hospitalization.  Her chemotherapy has been discontinued due to complications of febrile neutropenia and pneumonia. Continue supportive therapy and as  needed blood transfusions. Today her hemoglobin is 7.7.  She will receive a blood transfusion tomorrow in the sickle cell center.    3. Iron overload:  Her exjade was increased to 1000mg  per day. We will continue to follow her ferritin closely  4.  I added on a urinalysis and culture to evaluate her dysuria.  I also prescribed Cipro 250mg  BID.    5.  For headaches she can take tylenol with a max of 2g per day.  I suspect her headaches are due to her anemia and will improve after her transfusion.    6.  she is up-to-date with her flu vaccine and a pneumonia vaccine  7. bilateral breast screening mammogram  revealed no mammographic evidence of malignancy   8. I have asked the patient to followup with her primary care physician for further evaluation of change in bowel habits(feels like incomplete evacuation of stool).    All questions were answered. The patient knows to call the clinic with any problems, questions or concerns. We can certainly see the patient much sooner if necessary.  I spent 25 minutes counseling the patient face to face. The total time spent in the appointment was 30 minutes.   Minette Headland, [place] [phone]

## 2013-06-28 NOTE — Telephone Encounter (Signed)
, °

## 2013-06-28 NOTE — Patient Instructions (Signed)
Ciprofloxacin tablets What is this medicine? CIPROFLOXACIN (sip roe FLOX a sin) is a quinolone antibiotic. It is used to treat certain kinds of bacterial infections. It will not work for colds, flu, or other viral infections. This medicine may be used for other purposes; ask your health care provider or pharmacist if you have questions. COMMON BRAND NAME(S): Cipro What should I tell my health care provider before I take this medicine? They need to know if you have any of these conditions: -bone problems -cerebral disease -joint problems -irregular heartbeat -kidney disease -liver disease -myasthenia gravis -seizure disorder -tendon problems -an unusual or allergic reaction to ciprofloxacin, other antibiotics or medicines, foods, dyes, or preservatives -pregnant or trying to get pregnant -breast-feeding How should I use this medicine? Take this medicine by mouth with a glass of water. Follow the directions on the prescription label. Take your medicine at regular intervals. Do not take your medicine more often than directed. Take all of your medicine as directed even if you think your are better. Do not skip doses or stop your medicine early. You can take this medicine with food or on an empty stomach. It can be taken with a meal that contains dairy or calcium, but do not take it alone with a dairy product, like milk or yogurt or calcium-fortified juice. A special MedGuide will be given to you by the pharmacist with each prescription and refill. Be sure to read this information carefully each time. Talk to your pediatrician regarding the use of this medicine in children. Special care may be needed. Overdosage: If you think you have taken too much of this medicine contact a poison control center or emergency room at once. NOTE: This medicine is only for you. Do not share this medicine with others. What if I miss a dose? If you miss a dose, take it as soon as you can. If it is almost time for  your next dose, take only that dose. Do not take double or extra doses. What may interact with this medicine? Do not take this medicine with any of the following medications: -cisapride -droperidol -terfenadine -tizanidine This medicine may also interact with the following medications: -antacids -caffeine -cyclosporin -didanosine (ddI) buffered tablets or powder -medicines for diabetes -medicines for inflammation like ibuprofen, naproxen -methotrexate -multivitamins -omeprazole -phenytoin -probenecid -sucralfate -theophylline -warfarin This list may not describe all possible interactions. Give your health care provider a list of all the medicines, herbs, non-prescription drugs, or dietary supplements you use. Also tell them if you smoke, drink alcohol, or use illegal drugs. Some items may interact with your medicine. What should I watch for while using this medicine? Tell your doctor or health care professional if your symptoms do not improve. Do not treat diarrhea with over the counter products. Contact your doctor if you have diarrhea that lasts more than 2 days or if it is severe and watery. You may get drowsy or dizzy. Do not drive, use machinery, or do anything that needs mental alertness until you know how this medicine affects you. Do not stand or sit up quickly, especially if you are an older patient. This reduces the risk of dizzy or fainting spells. This medicine can make you more sensitive to the sun. Keep out of the sun. If you cannot avoid being in the sun, wear protective clothing and use sunscreen. Do not use sun lamps or tanning beds/booths. Avoid antacids, aluminum, calcium, iron, magnesium, and zinc products for 6 hours before and 2 hours after taking  a dose of this medicine. What side effects may I notice from receiving this medicine? Side effects that you should report to your doctor or health care professional as soon as possible: - allergic reactions like skin  rash, itching or hives, swelling of the face, lips, or tongue - breathing problems - confusion, nightmares or hallucinations - feeling faint or lightheaded, falls - irregular heartbeat - joint, muscle or tendon pain or swelling - pain or trouble passing urine -persistent headache with or without blurred vision - redness, blistering, peeling or loosening of the skin, including inside the mouth - seizure - unusual pain, numbness, tingling, or weakness Side effects that usually do not require medical attention (report to your doctor or health care professional if they continue or are bothersome): - diarrhea - nausea or stomach upset - white patches or sores in the mouth This list may not describe all possible side effects. Call your doctor for medical advice about side effects. You may report side effects to FDA at 1-800-FDA-1088. Where should I keep my medicine? Keep out of the reach of children. Store at room temperature below 30 degrees C (86 degrees F). Keep container tightly closed. Throw away any unused medicine after the expiration date. NOTE: This sheet is a summary. It may not cover all possible information. If you have questions about this medicine, talk to your doctor, pharmacist, or health care provider.  2014, Elsevier/Gold Standard. (2011-05-03 12:53:06)

## 2013-06-28 NOTE — Telephone Encounter (Signed)
Pt has a new One Touch Verio glucometer and needs test strips and lancets sent to [place].

## 2013-06-28 NOTE — Telephone Encounter (Signed)
RX sent to pharmacy  

## 2013-06-29 ENCOUNTER — Non-Acute Institutional Stay (HOSPITAL_COMMUNITY)
Admission: AD | Admit: 2013-06-29 | Discharge: 2013-06-29 | Disposition: A | Discharge status: Home / Self Care | Payer: Medicare Other | Source: Ambulatory Visit | Attending: Oncology | Admitting: Oncology

## 2013-06-29 DIAGNOSIS — D469 Myelodysplastic syndrome, unspecified: Secondary | ICD-10-CM | POA: Insufficient documentation

## 2013-06-29 DIAGNOSIS — D630 Anemia in neoplastic disease: Secondary | ICD-10-CM | POA: Insufficient documentation

## 2013-06-29 DIAGNOSIS — C9200 Acute myeloblastic leukemia, not having achieved remission: Secondary | ICD-10-CM

## 2013-06-29 LAB — PREPARE RBC (CROSSMATCH)

## 2013-06-29 MED ORDER — FUROSEMIDE 10 MG/ML IJ SOLN
20.0000 mg | Freq: Once | INTRAMUSCULAR | Status: AC
  Administered 2013-06-29: 20 mg via INTRAVENOUS
  Filled 2013-06-29: qty 2

## 2013-06-29 MED ORDER — ACETAMINOPHEN 325 MG PO TABS
650.0000 mg | ORAL_TABLET | Freq: Once | ORAL | Status: AC
  Administered 2013-06-29: 650 mg via ORAL
  Filled 2013-06-29: qty 2

## 2013-06-29 MED ORDER — SODIUM CHLORIDE 0.9 % IJ SOLN
INTRAMUSCULAR | Status: AC
  Filled 2013-06-29: qty 10

## 2013-06-29 MED ORDER — DIPHENHYDRAMINE HCL 25 MG PO TABS
25.0000 mg | ORAL_TABLET | Freq: Once | ORAL | Status: AC
  Administered 2013-06-29: 25 mg via ORAL
  Filled 2013-06-29 (×2): qty 1

## 2013-06-29 MED ORDER — HEPARIN SOD (PORK) LOCK FLUSH 100 UNIT/ML IV SOLN
250.0000 [IU] | INTRAVENOUS | Status: AC | PRN
  Administered 2013-06-29: 250 [IU]
  Filled 2013-06-29: qty 5

## 2013-06-29 MED ORDER — SODIUM CHLORIDE 0.9 % IJ SOLN
3.0000 mL | INTRAMUSCULAR | Status: AC | PRN
  Administered 2013-06-29: 3 mL

## 2013-06-29 MED ORDER — FUROSEMIDE 10 MG/ML IJ SOLN
20.0000 mg | Freq: Once | INTRAMUSCULAR | Status: DC
  Filled 2013-06-29: qty 2

## 2013-06-29 NOTE — Progress Notes (Signed)
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 No Complications.    Condition During Procedure:Patient tolerated Well. No complaints.    Condition at Discharge: Patient Tolerated well. No Complaints. Patient in no apparent distress. Instructed patient to seek medical attention if any changes in condition or concerns. Patient acknowledges. Patient left day hospital ambulatory with belongings, escorted by spouse.    Wendie Simmer, RN  [facility]

## 2013-06-29 NOTE — Discharge Instructions (Signed)

## 2013-06-30 LAB — TYPE AND SCREEN
ABO/RH(D): AB NEG
Antibody Screen: NEGATIVE
DONOR AG TYPE: NEGATIVE
Donor AG Type: NEGATIVE
Unit division: 0
Unit division: 0

## 2013-06-30 LAB — URINE CULTURE

## 2013-07-01 ENCOUNTER — Encounter: Payer: Self-pay | Admitting: Family Medicine

## 2013-07-01 ENCOUNTER — Ambulatory Visit (INDEPENDENT_AMBULATORY_CARE_PROVIDER_SITE_OTHER): Payer: Medicare Other | Admitting: Family Medicine

## 2013-07-01 VITALS — BP 140/60 | HR 85 | Wt 107.0 lb

## 2013-07-01 DIAGNOSIS — R151 Fecal smearing: Secondary | ICD-10-CM

## 2013-07-01 NOTE — Progress Notes (Signed)
Pre visit review using our clinic review tool, if applicable. No additional management support is needed unless otherwise documented below in the visit note. 

## 2013-07-01 NOTE — Progress Notes (Signed)
   Subjective:    Patient ID: Meredith Rodriguez, female    DOB: [birth year], 80 y.o.   MRN: [id number]  HPI Patient seen with chief complaint of" not able to finish bowel movements". She's having fairly regular stools but sometimes has residual stool with wiping. She has never any blood. She has occasional loose stools which are chronic. Denies any recent fevers or chills. She's not had any constipation or straining. Appetite has been stable.  Recent UTI diagnosed by oncology. Escherichia coli sensitive to Cipro. Patient prescribed Cipro but had some insurance difficulties with getting this filled.  Past Medical History  Diagnosis Date  . Hypertension   . Diabetes mellitus   . Hyperkalemia   . IBS (irritable bowel syndrome)   . Hypercholesterolemia   . Cervical radiculopathy   . Anemia   . Cancer     leukemia  . Leukemia    Past Surgical History  Procedure Laterality Date  . Varicose veins      surgey 1959  . Tonsilectomy, adenoidectomy, bilateral myringotomy and tubes    . Esophagogastroduodenoscopy  04/01/2011    Procedure: ESOPHAGOGASTRODUODENOSCOPY (EGD);  Surgeon: Missy Sabins, MD;  Location: [facility] ENDOSCOPY;  Service: Endoscopy;  Laterality: N/A;    reports that she has never smoked. She has never used smokeless tobacco. She reports that she does not drink alcohol or use illicit drugs. family history includes Cancer in her daughter, sister, sister, and sister; Diabetes in her sister; Hypertension in her father; Stroke in her mother. Allergies  Allergen Reactions  . Amoxicillin [Amoxicillin]     Upset stomach  . Cephalexin Nausea Only  . Metformin And Related     Gi problems  . Phenergan [Promethazine Hcl] Other (See Comments)    Unknown   . Procardia [Nifedipine]     dizziness      Review of Systems  Constitutional: Negative for fever, chills and appetite change.  Respiratory: Negative for shortness of breath.   Cardiovascular: Negative for chest pain.  Gastrointestinal:  Negative for nausea, vomiting, abdominal pain, constipation and blood in stool.       Objective:   Physical Exam  Constitutional: She appears well-developed and well-nourished.  HENT:  Mouth/Throat: Oropharynx is clear and moist.  Cardiovascular: Normal rate.   Pulmonary/Chest: Effort normal and breath sounds normal. No respiratory distress. She has no wheezes. She has no rales.  Abdominal: Soft. Bowel sounds are normal. She exhibits no distension and no mass. There is no tenderness. There is no rebound and no guarding.  Genitourinary:  Good sphincter tone.  External skin tags.  Digital some firm stool in rectal vault but no impaction.  No other mass.          Assessment & Plan:  Fecal soilage.  No impaction and no major loss of sphincter control.  Increase fiber intake.  Increase fluid intake.

## 2013-07-01 NOTE — Patient Instructions (Signed)

## 2013-07-02 ENCOUNTER — Telehealth: Payer: Self-pay | Admitting: Adult Health

## 2013-07-05 ENCOUNTER — Other Ambulatory Visit (HOSPITAL_BASED_OUTPATIENT_CLINIC_OR_DEPARTMENT_OTHER): Payer: Medicare Other

## 2013-07-05 DIAGNOSIS — C9200 Acute myeloblastic leukemia, not having achieved remission: Secondary | ICD-10-CM

## 2013-07-05 DIAGNOSIS — D4620 Refractory anemia with excess of blasts, unspecified: Secondary | ICD-10-CM

## 2013-07-05 LAB — COMPREHENSIVE METABOLIC PANEL (CC13)
ALT: 23 U/L (ref 0–55)
ANION GAP: 8 meq/L (ref 3–11)
AST: 23 U/L (ref 5–34)
Albumin: 3.6 g/dL (ref 3.5–5.0)
Alkaline Phosphatase: 71 U/L (ref 40–150)
BILIRUBIN TOTAL: 0.61 mg/dL (ref 0.20–1.20)
BUN: 25.8 mg/dL (ref 7.0–26.0)
CO2: 23 meq/L (ref 22–29)
Calcium: 9.5 mg/dL (ref 8.4–10.4)
Chloride: 107 mEq/L (ref 98–109)
Creatinine: 1.5 mg/dL — ABNORMAL HIGH (ref 0.6–1.1)
Glucose: 220 mg/dl — ABNORMAL HIGH (ref 70–140)
Potassium: 3.9 mEq/L (ref 3.5–5.1)
SODIUM: 138 meq/L (ref 136–145)
TOTAL PROTEIN: 7 g/dL (ref 6.4–8.3)

## 2013-07-12 ENCOUNTER — Other Ambulatory Visit: Payer: Self-pay | Admitting: Adult Health

## 2013-07-12 ENCOUNTER — Other Ambulatory Visit (HOSPITAL_BASED_OUTPATIENT_CLINIC_OR_DEPARTMENT_OTHER): Payer: Medicare Other

## 2013-07-12 DIAGNOSIS — C9200 Acute myeloblastic leukemia, not having achieved remission: Secondary | ICD-10-CM

## 2013-07-12 LAB — CBC WITH DIFFERENTIAL/PLATELET
BASO%: 1.1 % (ref 0.0–2.0)
BASOS ABS: 0 10*3/uL (ref 0.0–0.1)
EOS%: 0.5 % (ref 0.0–7.0)
Eosinophils Absolute: 0 10*3/uL (ref 0.0–0.5)
HCT: 31.1 % — ABNORMAL LOW (ref 34.8–46.6)
HEMOGLOBIN: 10.4 g/dL — AB (ref 11.6–15.9)
LYMPH#: 1 10*3/uL (ref 0.9–3.3)
LYMPH%: 27.7 % (ref 14.0–49.7)
MCH: 28.4 pg (ref 25.1–34.0)
MCHC: 33.5 g/dL (ref 31.5–36.0)
MCV: 84.9 fL (ref 79.5–101.0)
MONO#: 0 10*3/uL — AB (ref 0.1–0.9)
MONO%: 0.7 % (ref 0.0–14.0)
NEUT#: 2.6 10*3/uL (ref 1.5–6.5)
NEUT%: 70 % (ref 38.4–76.8)
Platelets: 122 10*3/uL — ABNORMAL LOW (ref 145–400)
RBC: 3.67 10*6/uL — ABNORMAL LOW (ref 3.70–5.45)
RDW: 15.4 % — AB (ref 11.2–14.5)
WBC: 3.7 10*3/uL — AB (ref 3.9–10.3)

## 2013-07-12 LAB — COMPREHENSIVE METABOLIC PANEL (CC13)
ALT: 21 U/L (ref 0–55)
AST: 22 U/L (ref 5–34)
Albumin: 3.8 g/dL (ref 3.5–5.0)
Alkaline Phosphatase: 64 U/L (ref 40–150)
Anion Gap: 8 mEq/L (ref 3–11)
BUN: 31.2 mg/dL — ABNORMAL HIGH (ref 7.0–26.0)
CALCIUM: 9.8 mg/dL (ref 8.4–10.4)
CHLORIDE: 106 meq/L (ref 98–109)
CO2: 24 meq/L (ref 22–29)
CREATININE: 1.6 mg/dL — AB (ref 0.6–1.1)
GLUCOSE: 240 mg/dL — AB (ref 70–140)
Potassium: 3.5 mEq/L (ref 3.5–5.1)
Sodium: 137 mEq/L (ref 136–145)
Total Bilirubin: 0.6 mg/dL (ref 0.20–1.20)
Total Protein: 7 g/dL (ref 6.4–8.3)

## 2013-07-12 LAB — TECHNOLOGIST REVIEW

## 2013-07-19 ENCOUNTER — Telehealth: Payer: Self-pay | Admitting: Adult Health

## 2013-07-19 ENCOUNTER — Other Ambulatory Visit: Payer: Self-pay | Admitting: Hematology and Oncology

## 2013-07-19 ENCOUNTER — Other Ambulatory Visit (HOSPITAL_BASED_OUTPATIENT_CLINIC_OR_DEPARTMENT_OTHER): Payer: Medicare Other

## 2013-07-19 DIAGNOSIS — C9200 Acute myeloblastic leukemia, not having achieved remission: Secondary | ICD-10-CM

## 2013-07-19 LAB — COMPREHENSIVE METABOLIC PANEL (CC13)
ALBUMIN: 3.5 g/dL (ref 3.5–5.0)
ALT: 22 U/L (ref 0–55)
AST: 22 U/L (ref 5–34)
Alkaline Phosphatase: 65 U/L (ref 40–150)
Anion Gap: 9 mEq/L (ref 3–11)
BUN: 27.3 mg/dL — ABNORMAL HIGH (ref 7.0–26.0)
CHLORIDE: 106 meq/L (ref 98–109)
CO2: 22 mEq/L (ref 22–29)
Calcium: 9.4 mg/dL (ref 8.4–10.4)
Creatinine: 1.4 mg/dL — ABNORMAL HIGH (ref 0.6–1.1)
Glucose: 281 mg/dl — ABNORMAL HIGH (ref 70–140)
POTASSIUM: 3.8 meq/L (ref 3.5–5.1)
SODIUM: 137 meq/L (ref 136–145)
TOTAL PROTEIN: 6.5 g/dL (ref 6.4–8.3)
Total Bilirubin: 0.6 mg/dL (ref 0.20–1.20)

## 2013-07-19 LAB — CBC WITH DIFFERENTIAL/PLATELET
BASO%: 0.3 % (ref 0.0–2.0)
Basophils Absolute: 0 10*3/uL (ref 0.0–0.1)
EOS%: 0.3 % (ref 0.0–7.0)
Eosinophils Absolute: 0 10*3/uL (ref 0.0–0.5)
HEMATOCRIT: 25.3 % — AB (ref 34.8–46.6)
HGB: 8.3 g/dL — ABNORMAL LOW (ref 11.6–15.9)
LYMPH%: 30.6 % (ref 14.0–49.7)
MCH: 27.9 pg (ref 25.1–34.0)
MCHC: 32.8 g/dL (ref 31.5–36.0)
MCV: 84.9 fL (ref 79.5–101.0)
MONO#: 0.2 10*3/uL (ref 0.1–0.9)
MONO%: 4.5 % (ref 0.0–14.0)
NEUT#: 2.2 10*3/uL (ref 1.5–6.5)
NEUT%: 64.3 % (ref 38.4–76.8)
NRBC: 0 % (ref 0–0)
Platelets: 122 10*3/uL — ABNORMAL LOW (ref 145–400)
RBC: 2.98 10*6/uL — AB (ref 3.70–5.45)
RDW: 16.1 % — ABNORMAL HIGH (ref 11.2–14.5)
WBC: 3.4 10*3/uL — AB (ref 3.9–10.3)
lymph#: 1 10*3/uL (ref 0.9–3.3)

## 2013-07-19 LAB — TECHNOLOGIST REVIEW

## 2013-07-19 NOTE — Telephone Encounter (Signed)
Called patient about her hemoglobin decrease.  She will return on Thursday, March, 26 for labs only.  She is in agreement with this plan and denies any symptoms whatsoever.  She knows to contact us in the interim if she has any concerns.    Minette Headland, [place] [phone]

## 2013-07-19 NOTE — Telephone Encounter (Signed)
Left message for patient to call us back regarding lab work.  Her hemoglobin is 8.3 and it has dropped 2 points since one week ago.  I wanted to ask her if she is having any blood in her stool, or any symptoms of anemia.  I also recommend that she return Wednesday or Thursday of this week for a repeat lab and possible transfusion.    Minette Headland, [place] [phone]

## 2013-07-20 ENCOUNTER — Telehealth: Payer: Self-pay | Admitting: *Deleted

## 2013-07-20 NOTE — Telephone Encounter (Signed)
Per 3/23 POF I have scheduled patient appt. I have called and gave the patient her appt.

## 2013-07-22 ENCOUNTER — Other Ambulatory Visit (HOSPITAL_BASED_OUTPATIENT_CLINIC_OR_DEPARTMENT_OTHER): Payer: Medicare Other

## 2013-07-22 ENCOUNTER — Ambulatory Visit (HOSPITAL_BASED_OUTPATIENT_CLINIC_OR_DEPARTMENT_OTHER): Payer: Medicare Other

## 2013-07-22 ENCOUNTER — Other Ambulatory Visit: Payer: Self-pay

## 2013-07-22 ENCOUNTER — Other Ambulatory Visit: Payer: Self-pay | Admitting: *Deleted

## 2013-07-22 VITALS — BP 150/48 | HR 68 | Temp 98.1°F | Resp 19

## 2013-07-22 DIAGNOSIS — C9200 Acute myeloblastic leukemia, not having achieved remission: Secondary | ICD-10-CM

## 2013-07-22 DIAGNOSIS — D649 Anemia, unspecified: Secondary | ICD-10-CM

## 2013-07-22 LAB — CBC WITH DIFFERENTIAL/PLATELET
BASO%: 1.6 % (ref 0.0–2.0)
BASOS ABS: 0.1 10*3/uL (ref 0.0–0.1)
EOS ABS: 0 10*3/uL (ref 0.0–0.5)
EOS%: 0.5 % (ref 0.0–7.0)
HEMATOCRIT: 25.4 % — AB (ref 34.8–46.6)
HGB: 8.3 g/dL — ABNORMAL LOW (ref 11.6–15.9)
LYMPH%: 24.4 % (ref 14.0–49.7)
MCH: 27.9 pg (ref 25.1–34.0)
MCHC: 32.7 g/dL (ref 31.5–36.0)
MCV: 85.1 fL (ref 79.5–101.0)
MONO#: 0 10*3/uL — AB (ref 0.1–0.9)
MONO%: 0.2 % (ref 0.0–14.0)
NEUT%: 73.3 % (ref 38.4–76.8)
NEUTROS ABS: 2.6 10*3/uL (ref 1.5–6.5)
Platelets: 122 10*3/uL — ABNORMAL LOW (ref 145–400)
RBC: 2.98 10*6/uL — AB (ref 3.70–5.45)
RDW: 15.3 % — ABNORMAL HIGH (ref 11.2–14.5)
WBC: 3.5 10*3/uL — ABNORMAL LOW (ref 3.9–10.3)
lymph#: 0.9 10*3/uL (ref 0.9–3.3)

## 2013-07-22 LAB — COMPREHENSIVE METABOLIC PANEL (CC13)
ALBUMIN: 3.6 g/dL (ref 3.5–5.0)
ALK PHOS: 66 U/L (ref 40–150)
ALT: 21 U/L (ref 0–55)
AST: 22 U/L (ref 5–34)
Anion Gap: 11 mEq/L (ref 3–11)
BILIRUBIN TOTAL: 0.6 mg/dL (ref 0.20–1.20)
BUN: 23.8 mg/dL (ref 7.0–26.0)
CO2: 22 mEq/L (ref 22–29)
CREATININE: 1.2 mg/dL — AB (ref 0.6–1.1)
Calcium: 9.5 mg/dL (ref 8.4–10.4)
Chloride: 106 mEq/L (ref 98–109)
Glucose: 228 mg/dl — ABNORMAL HIGH (ref 70–140)
Potassium: 4 mEq/L (ref 3.5–5.1)
Sodium: 139 mEq/L (ref 136–145)
Total Protein: 6.7 g/dL (ref 6.4–8.3)

## 2013-07-22 LAB — TECHNOLOGIST REVIEW

## 2013-07-22 LAB — PREPARE RBC (CROSSMATCH)

## 2013-07-22 LAB — HOLD TUBE, BLOOD BANK

## 2013-07-22 MED ORDER — DIPHENHYDRAMINE HCL 25 MG PO CAPS
25.0000 mg | ORAL_CAPSULE | Freq: Once | ORAL | Status: AC
  Administered 2013-07-22: 25 mg via ORAL

## 2013-07-22 MED ORDER — ACETAMINOPHEN 325 MG PO TABS
ORAL_TABLET | ORAL | Status: AC
  Filled 2013-07-22: qty 2

## 2013-07-22 MED ORDER — HEPARIN SOD (PORK) LOCK FLUSH 100 UNIT/ML IV SOLN
500.0000 [IU] | Freq: Every day | INTRAVENOUS | Status: AC | PRN
  Administered 2013-07-22: 500 [IU]
  Filled 2013-07-22: qty 5

## 2013-07-22 MED ORDER — SODIUM CHLORIDE 0.9 % IJ SOLN
10.0000 mL | INTRAMUSCULAR | Status: AC | PRN
  Administered 2013-07-22: 10 mL
  Filled 2013-07-22: qty 10

## 2013-07-22 MED ORDER — ACETAMINOPHEN 325 MG PO TABS
650.0000 mg | ORAL_TABLET | Freq: Once | ORAL | Status: AC
  Administered 2013-07-22: 650 mg via ORAL

## 2013-07-22 MED ORDER — DEFERASIROX 500 MG PO TBSO: 1000.0000 mg | ORAL_TABLET | Freq: Every day | ORAL | Status: DC

## 2013-07-22 MED ORDER — SODIUM CHLORIDE 0.9 % IV SOLN
250.0000 mL | Freq: Once | INTRAVENOUS | Status: AC
  Administered 2013-07-22: 250 mL via INTRAVENOUS

## 2013-07-22 NOTE — Addendum Note (Signed)
Addended by: Wyonia Hough on: 07/22/2013 10:14 AM   Modules accepted: Orders

## 2013-07-22 NOTE — Patient Instructions (Signed)
Blood Transfusion Information WHAT IS A BLOOD TRANSFUSION? A transfusion is the replacement of blood or some of its parts. Blood is made up of multiple cells which provide different functions.  Red blood cells carry oxygen and are used for blood loss replacement.  White blood cells fight against infection.  Platelets control bleeding.  Plasma helps clot blood.  Other blood products are available for specialized needs, such as hemophilia or other clotting disorders. BEFORE THE TRANSFUSION  Who gives blood for transfusions?   You may be able to donate blood to be used at a later date on yourself (autologous donation).  Relatives can be asked to donate blood. This is generally not any safer than if you have received blood from a stranger. The same precautions are taken to ensure safety when a relative's blood is donated.  Healthy volunteers who are fully evaluated to make sure their blood is safe. This is blood bank blood. Transfusion therapy is the safest it has ever been in the practice of medicine. Before blood is taken from a donor, a complete history is taken to make sure that person has no history of diseases nor engages in risky social behavior (examples are intravenous drug use or sexual activity with multiple partners). The donor's travel history is screened to minimize risk of transmitting infections, such as malaria. The donated blood is tested for signs of infectious diseases, such as HIV and hepatitis. The blood is then tested to be sure it is compatible with you in order to minimize the chance of a transfusion reaction. If you or a relative donates blood, this is often done in anticipation of surgery and is not appropriate for emergency situations. It takes many days to process the donated blood. RISKS AND COMPLICATIONS Although transfusion therapy is very safe and saves many lives, the main dangers of transfusion include:   Getting an infectious disease.  Developing a  transfusion reaction. This is an allergic reaction to something in the blood you were given. Every precaution is taken to prevent this. The decision to have a blood transfusion has been considered carefully by your caregiver before blood is given. Blood is not given unless the benefits outweigh the risks. AFTER THE TRANSFUSION  Right after receiving a blood transfusion, you will usually feel much better and more energetic. This is especially true if your red blood cells have gotten low (anemic). The transfusion raises the level of the red blood cells which carry oxygen, and this usually causes an energy increase.  The nurse administering the transfusion will monitor you carefully for complications. HOME CARE INSTRUCTIONS  No special instructions are needed after a transfusion. You may find your energy is better. Speak with your caregiver about any limitations on activity for underlying diseases you may have. SEEK MEDICAL CARE IF:   Your condition is not improving after your transfusion.  You develop redness or irritation at the intravenous (IV) site. SEEK IMMEDIATE MEDICAL CARE IF:  Any of the following symptoms occur over the next 12 hours:  Shaking chills.  You have a temperature by mouth above 102 F (38.9 C), not controlled by medicine.  Chest, back, or muscle pain.  People around you feel you are not acting correctly or are confused.  Shortness of breath or difficulty breathing.  Dizziness and fainting.  You get a rash or develop hives.  You have a decrease in urine output.  Your urine turns a dark color or changes to pink, red, or brown. Any of the following   symptoms occur over the next 10 days:  You have a temperature by mouth above 102 F (38.9 C), not controlled by medicine.  Shortness of breath.  Weakness after normal activity.  The white part of the eye turns yellow (jaundice).  You have a decrease in the amount of urine or are urinating less often.  Your  urine turns a dark color or changes to pink, red, or brown. Document Released: 04/12/2000 Document Revised: 07/08/2011 Document Reviewed: 11/30/2007 [employer] Patient Information 2014 [employer].  

## 2013-07-22 NOTE — Telephone Encounter (Signed)
THIS REFILL REQUEST FOR EXJADE WAS TAKEN TO LINDSEY CORNETTO,NP.

## 2013-07-23 LAB — TYPE AND SCREEN
ABO/RH(D): AB NEG
Antibody Screen: NEGATIVE
DONOR AG TYPE: NEGATIVE
DONOR AG TYPE: NEGATIVE
UNIT DIVISION: 0
Unit division: 0

## 2013-07-26 ENCOUNTER — Other Ambulatory Visit: Payer: Medicare Other

## 2013-07-26 ENCOUNTER — Ambulatory Visit: Payer: Medicare Other

## 2013-07-26 ENCOUNTER — Ambulatory Visit: Payer: Medicare Other | Admitting: Adult Health

## 2013-07-29 ENCOUNTER — Telehealth: Payer: Self-pay | Admitting: *Deleted

## 2013-07-29 NOTE — Telephone Encounter (Signed)
Pt called Dr. Laurelyn Sickle nurse about billing and gave to me since I had phone numbers.  I called pt back and she stated that its a authorization for a referral we made to another doctor that needs to be done for her insurance to pay.  Transferred her to Dannielle Huh in [place] to take care of.

## 2013-08-05 ENCOUNTER — Telehealth: Payer: Self-pay | Admitting: Oncology

## 2013-08-05 NOTE — Telephone Encounter (Signed)
[facility] 3/30 APPT-tlkd to pt daughter Meredith Rodriguez to adv of new appt time & date

## 2013-08-09 ENCOUNTER — Telehealth: Payer: Self-pay | Admitting: Oncology

## 2013-08-10 ENCOUNTER — Telehealth: Payer: Self-pay

## 2013-08-10 ENCOUNTER — Other Ambulatory Visit (HOSPITAL_BASED_OUTPATIENT_CLINIC_OR_DEPARTMENT_OTHER): Payer: Medicare Other

## 2013-08-10 DIAGNOSIS — R5383 Other fatigue: Secondary | ICD-10-CM

## 2013-08-10 DIAGNOSIS — R5381 Other malaise: Secondary | ICD-10-CM

## 2013-08-10 DIAGNOSIS — C9200 Acute myeloblastic leukemia, not having achieved remission: Secondary | ICD-10-CM

## 2013-08-10 DIAGNOSIS — D4620 Refractory anemia with excess of blasts, unspecified: Secondary | ICD-10-CM

## 2013-08-10 LAB — COMPREHENSIVE METABOLIC PANEL (CC13)
ALT: 21 U/L (ref 0–55)
AST: 25 U/L (ref 5–34)
Albumin: 3.4 g/dL — ABNORMAL LOW (ref 3.5–5.0)
Alkaline Phosphatase: 70 U/L (ref 40–150)
Anion Gap: 8 mEq/L (ref 3–11)
BUN: 25.1 mg/dL (ref 7.0–26.0)
CALCIUM: 9.3 mg/dL (ref 8.4–10.4)
CO2: 25 mEq/L (ref 22–29)
Chloride: 106 mEq/L (ref 98–109)
Creatinine: 1.3 mg/dL — ABNORMAL HIGH (ref 0.6–1.1)
Glucose: 271 mg/dl — ABNORMAL HIGH (ref 70–140)
Potassium: 3.8 mEq/L (ref 3.5–5.1)
Sodium: 139 mEq/L (ref 136–145)
Total Bilirubin: 0.6 mg/dL (ref 0.20–1.20)
Total Protein: 6.8 g/dL (ref 6.4–8.3)

## 2013-08-10 LAB — CBC WITH DIFFERENTIAL/PLATELET
BASO%: 0.4 % (ref 0.0–2.0)
Basophils Absolute: 0 10*3/uL (ref 0.0–0.1)
EOS%: 0 % (ref 0.0–7.0)
Eosinophils Absolute: 0 10*3/uL (ref 0.0–0.5)
HCT: 28.6 % — ABNORMAL LOW (ref 34.8–46.6)
HGB: 9.5 g/dL — ABNORMAL LOW (ref 11.6–15.9)
LYMPH%: 35.2 % (ref 14.0–49.7)
MCH: 28.4 pg (ref 25.1–34.0)
MCHC: 33.2 g/dL (ref 31.5–36.0)
MCV: 85.6 fL (ref 79.5–101.0)
MONO#: 0.2 10*3/uL (ref 0.1–0.9)
MONO%: 5.7 % (ref 0.0–14.0)
NEUT#: 1.7 10*3/uL (ref 1.5–6.5)
NEUT%: 58.7 % (ref 38.4–76.8)
NRBC: 0 % (ref 0–0)
PLATELETS: 118 10*3/uL — AB (ref 145–400)
RBC: 3.34 10*6/uL — ABNORMAL LOW (ref 3.70–5.45)
RDW: 16.7 % — ABNORMAL HIGH (ref 11.2–14.5)
WBC: 2.8 10*3/uL — ABNORMAL LOW (ref 3.9–10.3)
lymph#: 1 10*3/uL (ref 0.9–3.3)

## 2013-08-10 LAB — TECHNOLOGIST REVIEW

## 2013-08-10 NOTE — Telephone Encounter (Signed)
Returned pt call - per [facility] labs ok, pt does not need to return to clinic.  Pt voiced understanding.   Per LC - pt needs labs next week.  POF sent.  Order entered.

## 2013-08-12 ENCOUNTER — Ambulatory Visit (INDEPENDENT_AMBULATORY_CARE_PROVIDER_SITE_OTHER): Payer: Medicare Other | Admitting: Family Medicine

## 2013-08-12 ENCOUNTER — Encounter: Payer: Self-pay | Admitting: Family Medicine

## 2013-08-12 ENCOUNTER — Telehealth: Payer: Self-pay | Admitting: Oncology

## 2013-08-12 VITALS — BP 138/68 | HR 90 | Temp 98.0°F | Wt 107.0 lb

## 2013-08-12 DIAGNOSIS — J209 Acute bronchitis, unspecified: Secondary | ICD-10-CM

## 2013-08-12 DIAGNOSIS — R300 Dysuria: Secondary | ICD-10-CM

## 2013-08-12 LAB — POCT URINALYSIS DIPSTICK
Bilirubin, UA: NEGATIVE
Ketones, UA: NEGATIVE
Leukocytes, UA: NEGATIVE
Nitrite, UA: NEGATIVE
PH UA: 7
SPEC GRAV UA: 1.015
UROBILINOGEN UA: 0.2

## 2013-08-12 NOTE — Progress Notes (Signed)
Pre visit review using our clinic review tool, if applicable. No additional management support is needed unless otherwise documented below in the visit note. 

## 2013-08-12 NOTE — Patient Instructions (Signed)
Acute Bronchitis Bronchitis is inflammation of the airways that extend from the windpipe into the lungs (bronchi). The inflammation often causes mucus to develop. This leads to a cough, which is the most common symptom of bronchitis.  In acute bronchitis, the condition usually develops suddenly and goes away over time, usually in a couple weeks. Smoking, allergies, and asthma can make bronchitis worse. Repeated episodes of bronchitis may cause further lung problems.  CAUSES Acute bronchitis is most often caused by the same virus that causes a cold. The virus can spread from person to person (contagious).  SIGNS AND SYMPTOMS   Cough.   Fever.   Coughing up mucus.   Body aches.   Chest congestion.   Chills.   Shortness of breath.   Sore throat.  DIAGNOSIS  Acute bronchitis is usually diagnosed through a physical exam. Tests, such as chest X-rays, are sometimes done to rule out other conditions.  TREATMENT  Acute bronchitis usually goes away in a couple weeks. Often times, no medical treatment is necessary. Medicines are sometimes given for relief of fever or cough. Antibiotics are usually not needed but may be prescribed in certain situations. In some cases, an inhaler may be recommended to help reduce shortness of breath and control the cough. A cool mist vaporizer may also be used to help thin bronchial secretions and make it easier to clear the chest.  HOME CARE INSTRUCTIONS  Get plenty of rest.   Drink enough fluids to keep your urine clear or pale yellow (unless you have a medical condition that requires fluid restriction). Increasing fluids may help thin your secretions and will prevent dehydration.   Only take over-the-counter or prescription medicines as directed by your health care provider.   Avoid smoking and secondhand smoke. Exposure to cigarette smoke or irritating chemicals will make bronchitis worse. If you are a smoker, consider using nicotine gum or skin  patches to help control withdrawal symptoms. Quitting smoking will help your lungs heal faster.   Reduce the chances of another bout of acute bronchitis by washing your hands frequently, avoiding people with cold symptoms, and trying not to touch your hands to your mouth, nose, or eyes.   Follow up with your health care provider as directed.  SEEK MEDICAL CARE IF: Your symptoms do not improve after 1 week of treatment.  SEEK IMMEDIATE MEDICAL CARE IF:  You develop an increased fever or chills.   You have chest pain.   You have severe shortness of breath.  You have bloody sputum.   You develop dehydration.  You develop fainting.  You develop repeated vomiting.  You develop a severe headache. MAKE SURE YOU:   Understand these instructions.  Will watch your condition.  Will get help right away if you are not doing well or get worse. Document Released: 05/23/2004 Document Revised: 12/16/2012 Document Reviewed: 10/06/2012 [facility] Patient Information 2014 [place].  Dysuria Dysuria is the medical term for pain with urination. There are many causes for dysuria, but urinary tract infection is the most common. If a urinalysis was performed it can show that there is a urinary tract infection. A urine culture confirms that you or your child is sick. You will need to follow up with a healthcare provider because:  If a urine culture was done you will need to know the culture results and treatment recommendations.  If the urine culture was positive, you or your child will need to be put on antibiotics or know if the antibiotics prescribed  are the right antibiotics for your urinary tract infection.  If the urine culture is negative (no urinary tract infection), then other causes may need to be explored or antibiotics need to be stopped. Today laboratory work may have been done and there does not seem to be an infection. If cultures were done they will take at least 24 to 48  hours to be completed. Today x-rays may have been taken and they read as normal. No cause can be found for the problems. The x-rays may be re-read by a radiologist and you will be contacted if additional findings are made. You or your child may have been put on medications to help with this problem until you can see your primary caregiver. If the problems get better, see your primary caregiver if the problems return. If you were given antibiotics (medications which kill germs), take all of the mediations as directed for the full course of treatment.  If laboratory work was done, you need to find the results. Leave a telephone number where you can be reached. If this is not possible, make sure you find out how you are to get test results. HOME CARE INSTRUCTIONS   Drink lots of fluids. For adults, drink eight, 8 ounce glasses of clear juice or water a day. For children, replace fluids as suggested by your caregiver.  Empty the bladder often. Avoid holding urine for long periods of time.  After a bowel movement, women should cleanse front to back, using each tissue only once.  Empty your bladder before and after sexual intercourse.  Take all the medicine given to you until it is gone. You may feel better in a few days, but TAKE ALL MEDICINE.  Avoid caffeine, tea, alcohol and carbonated beverages, because they tend to irritate the bladder.  In men, alcohol may irritate the prostate.  Only take over-the-counter or prescription medicines for pain, discomfort, or fever as directed by your caregiver.  If your caregiver has given you a follow-up appointment, it is very important to keep that appointment. Not keeping the appointment could result in a chronic or permanent injury, pain, and disability. If there is any problem keeping the appointment, you must call back to this facility for assistance. SEEK IMMEDIATE MEDICAL CARE IF:   Back pain develops.  A fever develops.  There is nausea (feeling  sick to your stomach) or vomiting (throwing up).  Problems are no better with medications or are getting worse. MAKE SURE YOU:   Understand these instructions.  Will watch your condition.  Will get help right away if you are not doing well or get worse. Document Released: 01/12/2004 Document Revised: 07/08/2011 Document Reviewed: 11/19/2007 [facility] Patient Information 2014 [place].

## 2013-08-12 NOTE — Telephone Encounter (Signed)
per pof to sch pt-cld & tlkwd w/pt to adv off lab only appt-pt understood

## 2013-08-12 NOTE — Progress Notes (Signed)
Subjective:    Patient ID: Meredith Rodriguez, female    DOB: Sep 29, 1925, 80 y.o.   MRN: [id number]  HPI Comments: Patient is a 80 year-old female presenting with a cough and dysuria.   Cough began 3 days ago and seems to be originating from her chest. Patient states it is not head cold and that she is spitting up yellow phlegm. She cannot identify any aggravating factors and has not tried anything to make it better. Symptoms are worse in the morning and late evening. She is not in any pain. She has not had any sick contacts, noticed any enlarged nodes, and denies fever, chills, shortness of breath and hemoptysis. She had the flu shot and pneumonia shot and has never smoked. She has had pneumonia in the past.  Dysuria Some burning when urinating began this morning. Patient reports not drinking as much water as she should. Recently took a trip and had to hold her urine for long periods of time during the car ride from [place]. She denies frequency. Recently had a bladder infection 2 months ago.   Cough This is a new problem. The current episode started in the past 7 days. The problem has been waxing and waning. The problem occurs constantly. Pertinent negatives include no chest pain, chills, fever, myalgias or sore throat. She has tried nothing for the symptoms. Her past medical history is significant for pneumonia.    Past Medical History  Diagnosis Date  . Hypertension   . Diabetes mellitus   . Hyperkalemia   . IBS (irritable bowel syndrome)   . Hypercholesterolemia   . Cervical radiculopathy   . Anemia   . Cancer     leukemia  . Leukemia     Past Surgical History  Procedure Laterality Date  . Varicose veins      surgey 1959  . Tonsilectomy, adenoidectomy, bilateral myringotomy and tubes    . Esophagogastroduodenoscopy  04/01/2011    Procedure: ESOPHAGOGASTRODUODENOSCOPY (EGD);  Surgeon: Missy Sabins, MD;  Location[facility] ENDOSCOPY;  Service: Endoscopy;  Laterality: N/A;     Review  of Systems  Constitutional: Positive for fatigue. Negative for fever, chills and appetite change.  HENT: Negative for congestion, sinus pressure and sore throat.   Respiratory: Positive for cough.   Cardiovascular: Negative for chest pain and palpitations.  Gastrointestinal: Negative for nausea, vomiting, abdominal pain and constipation.  Musculoskeletal: Negative for arthralgias and myalgias.  Neurological: Negative for dizziness, syncope and light-headedness.       Objective:   Physical Exam  Constitutional: She appears well-developed and well-nourished.  Eyes: Conjunctivae are normal. Pupils are equal, round, and reactive to light.  Neck: Normal range of motion.  Cardiovascular: Normal rate, regular rhythm and normal pulses.   Pulses:      Radial pulses are 2+ on the right side, and 2+ on the left side.  Pulmonary/Chest: Effort normal and breath sounds normal. No accessory muscle usage. She has no wheezes. She has no rhonchi. She has no rales. Chest wall is not dull to percussion. She exhibits no tenderness and no retraction.  Abdominal: Soft. There is no tenderness.  Musculoskeletal:       Cervical back: She exhibits no tenderness.       Thoracic back: She exhibits no bony tenderness.  Neurological: She is alert.  Skin: Skin is warm and dry.          Assessment & Plan:  1. Acute Bronchitis Probable viral illness given the absence of fever  and short duration of symptoms. Will treat cough symptomatically with OTC Delsym.   2. Dysuria Urinalysis positive for blood and glucose. Will order urine culture. Will wait for results to determine whether to treat with antibiotic.   [place], PA-S  As above.  Since she is afebrile and urine (no leukocytes or nitrites) will check urine cx and prompt follow up for any blood.  Carolann Littler, MD

## 2013-08-17 ENCOUNTER — Ambulatory Visit (HOSPITAL_BASED_OUTPATIENT_CLINIC_OR_DEPARTMENT_OTHER): Payer: Medicare Other

## 2013-08-17 ENCOUNTER — Ambulatory Visit (HOSPITAL_COMMUNITY)
Admission: RE | Admit: 2013-08-17 | Discharge: 2013-08-17 | Disposition: A | Discharge status: Home / Self Care | Payer: Medicare Other | Source: Ambulatory Visit | Attending: Oncology | Admitting: Oncology

## 2013-08-17 ENCOUNTER — Other Ambulatory Visit: Payer: Self-pay | Admitting: Adult Health

## 2013-08-17 ENCOUNTER — Other Ambulatory Visit (HOSPITAL_BASED_OUTPATIENT_CLINIC_OR_DEPARTMENT_OTHER): Payer: Medicare Other

## 2013-08-17 VITALS — BP 126/42 | HR 72 | Temp 98.4°F | Resp 18

## 2013-08-17 DIAGNOSIS — C9200 Acute myeloblastic leukemia, not having achieved remission: Secondary | ICD-10-CM

## 2013-08-17 DIAGNOSIS — D469 Myelodysplastic syndrome, unspecified: Secondary | ICD-10-CM | POA: Insufficient documentation

## 2013-08-17 DIAGNOSIS — D649 Anemia, unspecified: Secondary | ICD-10-CM

## 2013-08-17 DIAGNOSIS — D630 Anemia in neoplastic disease: Secondary | ICD-10-CM

## 2013-08-17 LAB — CBC WITH DIFFERENTIAL/PLATELET
BASO%: 0.4 % (ref 0.0–2.0)
Basophils Absolute: 0 10*3/uL (ref 0.0–0.1)
EOS%: 0.4 % (ref 0.0–7.0)
Eosinophils Absolute: 0 10*3/uL (ref 0.0–0.5)
HCT: 24.6 % — ABNORMAL LOW (ref 34.8–46.6)
HEMOGLOBIN: 8.1 g/dL — AB (ref 11.6–15.9)
LYMPH%: 30.4 % (ref 14.0–49.7)
MCH: 27.8 pg (ref 25.1–34.0)
MCHC: 32.9 g/dL (ref 31.5–36.0)
MCV: 84.5 fL (ref 79.5–101.0)
MONO#: 0.2 10*3/uL (ref 0.1–0.9)
MONO%: 5.9 % (ref 0.0–14.0)
NEUT%: 62.9 % (ref 38.4–76.8)
NEUTROS ABS: 1.7 10*3/uL (ref 1.5–6.5)
NRBC: 0 % (ref 0–0)
PLATELETS: 107 10*3/uL — AB (ref 145–400)
RBC: 2.91 10*6/uL — AB (ref 3.70–5.45)
RDW: 15.9 % — ABNORMAL HIGH (ref 11.2–14.5)
WBC: 2.7 10*3/uL — AB (ref 3.9–10.3)
lymph#: 0.8 10*3/uL — ABNORMAL LOW (ref 0.9–3.3)

## 2013-08-17 LAB — COMPREHENSIVE METABOLIC PANEL (CC13)
ALBUMIN: 3.3 g/dL — AB (ref 3.5–5.0)
ALK PHOS: 64 U/L (ref 40–150)
ALT: 18 U/L (ref 0–55)
AST: 18 U/L (ref 5–34)
Anion Gap: 9 mEq/L (ref 3–11)
BUN: 22.6 mg/dL (ref 7.0–26.0)
CO2: 23 mEq/L (ref 22–29)
CREATININE: 1.3 mg/dL — AB (ref 0.6–1.1)
Calcium: 9.4 mg/dL (ref 8.4–10.4)
Chloride: 102 mEq/L (ref 98–109)
GLUCOSE: 297 mg/dL — AB (ref 70–140)
Potassium: 3.8 mEq/L (ref 3.5–5.1)
Sodium: 134 mEq/L — ABNORMAL LOW (ref 136–145)
Total Bilirubin: 0.69 mg/dL (ref 0.20–1.20)
Total Protein: 6.6 g/dL (ref 6.4–8.3)

## 2013-08-17 LAB — PREPARE RBC (CROSSMATCH)

## 2013-08-17 LAB — TECHNOLOGIST REVIEW

## 2013-08-17 LAB — HOLD TUBE, BLOOD BANK

## 2013-08-17 MED ORDER — ACETAMINOPHEN 325 MG PO TABS
ORAL_TABLET | ORAL | Status: AC
  Filled 2013-08-17: qty 2

## 2013-08-17 MED ORDER — ACETAMINOPHEN 325 MG PO TABS
650.0000 mg | ORAL_TABLET | Freq: Once | ORAL | Status: AC
  Administered 2013-08-17: 650 mg via ORAL

## 2013-08-17 MED ORDER — FUROSEMIDE 10 MG/ML IJ SOLN
20.0000 mg | Freq: Once | INTRAMUSCULAR | Status: AC
  Administered 2013-08-17: 20 mg via INTRAVENOUS

## 2013-08-17 MED ORDER — SODIUM CHLORIDE 0.9 % IV SOLN
250.0000 mL | Freq: Once | INTRAVENOUS | Status: AC
  Administered 2013-08-17: 250 mL via INTRAVENOUS

## 2013-08-17 MED ORDER — HEPARIN SOD (PORK) LOCK FLUSH 100 UNIT/ML IV SOLN
500.0000 [IU] | Freq: Every day | INTRAVENOUS | Status: AC | PRN
  Administered 2013-08-17: 500 [IU]
  Filled 2013-08-17: qty 5

## 2013-08-17 MED ORDER — DIPHENHYDRAMINE HCL 25 MG PO CAPS
25.0000 mg | ORAL_CAPSULE | Freq: Once | ORAL | Status: AC
  Administered 2013-08-17: 25 mg via ORAL

## 2013-08-17 MED ORDER — DIPHENHYDRAMINE HCL 25 MG PO CAPS
ORAL_CAPSULE | ORAL | Status: AC
  Filled 2013-08-17: qty 1

## 2013-08-17 MED ORDER — SODIUM CHLORIDE 0.9 % IJ SOLN
10.0000 mL | INTRAMUSCULAR | Status: AC | PRN
  Administered 2013-08-17: 10 mL
  Filled 2013-08-17: qty 10

## 2013-08-17 NOTE — Patient Instructions (Signed)
Blood Transfusion  A blood transfusion replaces your blood or some of its parts. Blood is replaced when you have lost blood because of surgery, an accident, or for severe blood conditions like anemia. You can donate blood to be used on yourself if you have a planned surgery. If you lose blood during that surgery, your own blood can be given back to you. Any blood given to you is checked to make sure it matches your blood type. Your temperature, blood pressure, and heart rate (vital signs) will be checked often.  GET HELP RIGHT AWAY IF:   You feel sick to your stomach (nauseous) or throw up (vomit).  You have watery poop (diarrhea).  You have shortness of breath or trouble breathing.  You have blood in your pee (urine) or have dark colored pee.  You have chest pain or tightness.  Your eyes or skin turn yellow (jaundice).  You have a temperature by mouth above 102 F (38.9 C), not controlled by medicine.  You start to shake and have chills.  You develop a a red rash (hives) or feel itchy.  You develop lightheadedness or feel confused.  You develop back, joint, or muscle pain.  You do not feel hungry (lost appetite).  You feel tired, restless, or nervous.  You develop belly (abdominal) cramps. Document Released: 07/12/2008 Document Revised: 07/08/2011 Document Reviewed: 07/12/2008 Exit[employer] Patient Information 2014 [employer].  

## 2013-08-18 LAB — TYPE AND SCREEN
ABO/RH(D): AB NEG
ANTIBODY SCREEN: NEGATIVE
DONOR AG TYPE: NEGATIVE
Donor AG Type: NEGATIVE
UNIT DIVISION: 0
UNIT DIVISION: 0

## 2013-08-20 ENCOUNTER — Other Ambulatory Visit: Payer: Self-pay | Admitting: Adult Health

## 2013-08-20 DIAGNOSIS — C9200 Acute myeloblastic leukemia, not having achieved remission: Secondary | ICD-10-CM

## 2013-08-22 ENCOUNTER — Other Ambulatory Visit: Payer: Self-pay | Admitting: Family Medicine

## 2013-08-25 ENCOUNTER — Other Ambulatory Visit (HOSPITAL_BASED_OUTPATIENT_CLINIC_OR_DEPARTMENT_OTHER): Payer: Medicare Other

## 2013-08-25 ENCOUNTER — Telehealth: Payer: Self-pay | Admitting: Hematology and Oncology

## 2013-08-25 ENCOUNTER — Ambulatory Visit (HOSPITAL_BASED_OUTPATIENT_CLINIC_OR_DEPARTMENT_OTHER): Payer: Medicare Other | Admitting: Hematology and Oncology

## 2013-08-25 VITALS — BP 130/57 | HR 85 | Temp 98.0°F | Resp 18 | Ht 66.0 in | Wt 103.6 lb

## 2013-08-25 DIAGNOSIS — D649 Anemia, unspecified: Secondary | ICD-10-CM

## 2013-08-25 DIAGNOSIS — D469 Myelodysplastic syndrome, unspecified: Secondary | ICD-10-CM

## 2013-08-25 DIAGNOSIS — C9200 Acute myeloblastic leukemia, not having achieved remission: Secondary | ICD-10-CM

## 2013-08-25 DIAGNOSIS — D61818 Other pancytopenia: Secondary | ICD-10-CM

## 2013-08-25 DIAGNOSIS — N390 Urinary tract infection, site not specified: Secondary | ICD-10-CM

## 2013-08-25 LAB — COMPREHENSIVE METABOLIC PANEL (CC13)
ALT: 20 U/L (ref 0–55)
ANION GAP: 12 meq/L — AB (ref 3–11)
AST: 19 U/L (ref 5–34)
Albumin: 3.2 g/dL — ABNORMAL LOW (ref 3.5–5.0)
Alkaline Phosphatase: 65 U/L (ref 40–150)
BILIRUBIN TOTAL: 0.59 mg/dL (ref 0.20–1.20)
BUN: 35.3 mg/dL — AB (ref 7.0–26.0)
CALCIUM: 10 mg/dL (ref 8.4–10.4)
CO2: 23 mEq/L (ref 22–29)
CREATININE: 1.5 mg/dL — AB (ref 0.6–1.1)
Chloride: 103 mEq/L (ref 98–109)
Glucose: 144 mg/dl — ABNORMAL HIGH (ref 70–140)
Potassium: 4.1 mEq/L (ref 3.5–5.1)
Sodium: 138 mEq/L (ref 136–145)
Total Protein: 7.2 g/dL (ref 6.4–8.3)

## 2013-08-25 LAB — CBC WITH DIFFERENTIAL/PLATELET
BASO%: 1 % (ref 0.0–2.0)
Basophils Absolute: 0 10*3/uL (ref 0.0–0.1)
EOS ABS: 0 10*3/uL (ref 0.0–0.5)
EOS%: 0.3 % (ref 0.0–7.0)
HEMATOCRIT: 34.1 % — AB (ref 34.8–46.6)
HEMOGLOBIN: 11.4 g/dL — AB (ref 11.6–15.9)
LYMPH#: 1.1 10*3/uL (ref 0.9–3.3)
LYMPH%: 35.7 % (ref 14.0–49.7)
MCH: 28.8 pg (ref 25.1–34.0)
MCHC: 33.4 g/dL (ref 31.5–36.0)
MCV: 86.1 fL (ref 79.5–101.0)
MONO#: 0.3 10*3/uL (ref 0.1–0.9)
MONO%: 11.4 % (ref 0.0–14.0)
NEUT#: 1.5 10*3/uL (ref 1.5–6.5)
NEUT%: 51.6 % (ref 38.4–76.8)
Platelets: 137 10*3/uL — ABNORMAL LOW (ref 145–400)
RBC: 3.96 10*6/uL (ref 3.70–5.45)
RDW: 15.5 % — AB (ref 11.2–14.5)
WBC: 3 10*3/uL — ABNORMAL LOW (ref 3.9–10.3)
nRBC: 0 % (ref 0–0)

## 2013-08-25 LAB — TECHNOLOGIST REVIEW

## 2013-08-25 NOTE — Progress Notes (Signed)
OFFICE PROGRESS NOTE  CC  [name], MD [place] [address]  CHIEF COMPLAINT:  F/U visit for Anemia  DIAGNOSIS: 80 year old female with severe and profound anemia due to a primary bone marrow problem-MDS or ?AML  CURRENT THERAPY:Supportive care  PRIOR THERAPY: As per previously documented Dr. Laurelyn Sickle note  #1. She was on Procrit every 2 weeks unfortunately this was not effective and discontinued   #3 patient is transfusion dependent anemia in the setting of myelodysplastic syndrome cannot exclude conversion to an acute leukemia.  #4 Patient received 3 cycles of Decitabine chemotherapy, her third cycle was complicated by febrile neutropenia and pneumonia causing hospitalization.  She is at present on supportive therapy   INTERVAL HISTORY: Meredith Rodriguez 80 y.o. female returns for follow up today for her MDS/?AML . She received blood transfusion last week when her hemoglobin was 8.1gm/dl. Today her hemoglobin is 11.4 hematocrit is 34.1and she feels much better. Her Exjade was increased to 1000 mg once daily  Since March 2015 for her iron overload her last iron indices were performed on 03/02//2015 and that revealed a ferritin of 4165. She complains of mild fatigue,  other than that denis any shortness of breath. she says that she's eating well.  She was treated with antibiotics for UTI recently. She does not have any increase in frequency, or dysurea She denies any fever, shortness of breath, chest pains, palpitations, headaches, blurred vision, blood in the stool or blood in the urine.   MEDICAL HISTORY: Past Medical History  Diagnosis Date  . Hypertension   . Diabetes mellitus   . Hyperkalemia   . IBS (irritable bowel syndrome)   . Hypercholesterolemia   . Cervical radiculopathy   . Anemia   . Cancer     leukemia  . Leukemia     ALLERGIES:  is allergic to amoxicillin; cephalexin; metformin and related; phenergan; and procardia.  MEDICATIONS:   Current Outpatient Prescriptions  Medication Sig Dispense Refill  . amLODipine (NORVASC) 10 MG tablet Take 1 tablet (10 mg total) by mouth daily.  90 tablet  3  . aspirin 81 MG tablet Take 81 mg by mouth daily.      . B-D UF III MINI PEN NEEDLES 31G X 5 MM MISC USE WITH LANTUS PEN DAILY AS INSTRUCTED  100 each  2  . cholecalciferol (VITAMIN D) 1000 UNITS tablet Take 1,000 Units by mouth daily.      Marland Kitchen EXJADE 500 MG disintegrating tablet TAKE 2 TABLETS BY MOUTH DAILY BEFORE BREAKFAST  60 tablet  3  . FREESTYLE LITE test strip USE TO TEST BLOOD SUGAR 4 TIMES DAILY AS DIRECTED  30 each  10  . insulin lispro (HUMALOG KWIKPEN) 100 UNIT/ML SOPN Inject 4 Units into the skin 2 (two) times daily with a meal. Inject 4 units twice daily into skin if blood glucose is over 100 at lunch and dinner      . Lancets (FREESTYLE) lancets 1 each by Other route 2 (two) times daily.       Marland Kitchen LANTUS SOLOSTAR 100 UNIT/ML SOPN INJECT 10 UNITS INTO THE SKIN DAILY  15 mL  3  . lidocaine-prilocaine (EMLA) cream Apply topically as needed.  30 g  4  . metoprolol (LOPRESSOR) 50 MG tablet Take 1 tablet (50 mg total) by mouth 2 (two) times daily.  180 tablet  3  . Omega-3 Fatty Acids (FISH OIL) 1000 MG CAPS Take 1,000 mg by mouth daily.       Marland Kitchen  simvastatin (ZOCOR) 5 MG tablet Take 5 mg by mouth at bedtime.      . [DISCONTINUED] glimepiride (AMARYL) 2 MG tablet Take 1 tablet (2 mg total) by mouth every morning.  30 tablet  11   Current Facility-Administered Medications  Medication Dose Route Frequency Provider Last Rate Last Dose  . TDaP (BOOSTRIX) injection 0.5 mL  0.5 mL Intramuscular Once [name], MD        SURGICAL HISTORY:  Past Surgical History  Procedure Laterality Date  . Varicose veins      surgey 1959  . Tonsilectomy, adenoidectomy, bilateral myringotomy and tubes    . Esophagogastroduodenoscopy  04/01/2011    Procedure: ESOPHAGOGASTRODUODENOSCOPY (EGD);  Surgeon: Missy Sabins, MD;  Location: [facility]  ENDOSCOPY;  Service: Endoscopy;  Laterality: N/A;    REVIEW OF SYSTEMS:   A 14 point review of systems was conducted and is otherwise negative except for what is noted above.      PHYSICAL EXAMINATION: BP 130/57  Pulse 85  Temp(Src) 98 F (36.7 C) (Oral)  Resp 18  Ht 5\' 6"  (1.676 m)  Wt 103 lb 9.6 oz (46.993 kg)  BMI 16.73 kg/m2 General: Patient is a well appearing female in no acute distress HEENT: PERRLA, sclerae anicteric no conjunctival pallor, MMM no erythema or exudate Neck: supple, no lymphadenopathy Breast exam: No masses felt Lungs: clear to auscultation bilaterally, no wheezes, rhonchi, or rales Cardiovascular: regular rate rhythm, S1, S2, no murmurs, rubs or gallops Abdomen: Soft, non-tender, non-distended, normoactive bowel sounds, no HSM Extremities: warm and well perfused, no clubbing, cyanosis, or edema Skin: No rashes.    Neuro: Non-focal ECOG PERFORMANCE STATUS: 1  LABORATORY DATA: Lab Results  Component Value Date   WBC 3.0* 08/25/2013   HGB 11.4* 08/25/2013   HCT 34.1* 08/25/2013   MCV 86.1 08/25/2013   PLT 137* 08/25/2013      Chemistry      Component Value Date/Time   NA 138 08/25/2013 0829   NA 135 02/10/2013 0620   K 4.1 08/25/2013 0829   K 4.1 02/10/2013 0620   CL 101 02/10/2013 0620   CL 99 06/01/2012 1028   CO2 23 08/25/2013 0829   CO2 27 02/10/2013 0620   BUN 35.3* 08/25/2013 0829   BUN 15 02/10/2013 0620   CREATININE 1.5* 08/25/2013 0829   CREATININE 0.62 02/10/2013 0620   CREATININE 0.69 07/18/2010 1202   GLU 215* 02/26/2012 1255      Component Value Date/Time   CALCIUM 10.0 08/25/2013 0829   CALCIUM 8.4 02/10/2013 0620   ALKPHOS 65 08/25/2013 0829   ALKPHOS 55 02/07/2013 2047   AST 19 08/25/2013 0829   AST 33 02/07/2013 2047   ALT 20 08/25/2013 0829   ALT 34 02/07/2013 2047   BILITOT 0.59 08/25/2013 0829   BILITOT 0.9 02/07/2013 2047       RADIOGRAPHIC STUDIES:  No results found.  ASSESSMENT/PLAN: 80 year old female with:  1.   MDS with refractory  anemia not responsive to Procrit   2. Patient was started on Decitabine chemotherapy.  She received three cycles of this, and then had an episode of febrile neutropenia resulting in pneumonia and hospitalization.  Her chemotherapy has been discontinued due to complications of febrile neutropenia and pneumonia. Continue supportive therapy and as needed blood transfusions.  Monotor weekly CBC and diff as scheduled  3. Iron overload:  She is on Exjade  1000 mg  by mouth once daily  with close monitoring of liver functions. Repeat  ferritin level and liver functions in one month.If ferritin levels does not decrease, Exjade can be increased to 1500mg  po once daily  4. Recent UTI: Rpt. U/A and U/C    5. she is up-to-date with her flu vaccine and a pneumonia vaccine  Next f/u visit in one Month with CBC and diff, CMP and ferritin and iron saturation   All questions were answered. The patient knows to call the clinic with any problems, questions or concerns. We can certainly see the patient much sooner if necessary.  I spent 20 minutes counseling the patient face to face. The total time spent in the appointment was 30 minutes.    Wilmon Arms , MD [facility] [phone] (Office)

## 2013-08-25 NOTE — Telephone Encounter (Signed)
per pof to sch appt-printed & gave pt copy of sch

## 2013-09-07 ENCOUNTER — Telehealth: Payer: Self-pay | Admitting: Family Medicine

## 2013-09-07 NOTE — Telephone Encounter (Addendum)
Pt needs a referral to hearing clinic with dr Sherrine Maples price phone [phone]. Pt has [facility] medicare complete. Pt has hearing aid and hearing loss

## 2013-09-08 NOTE — Telephone Encounter (Signed)
OK to refer.

## 2013-09-09 ENCOUNTER — Other Ambulatory Visit (HOSPITAL_COMMUNITY)
Admission: RE | Admit: 2013-09-09 | Discharge: 2013-09-09 | Disposition: A | Discharge status: Home / Self Care | Payer: Medicare Other | Source: Ambulatory Visit | Attending: Hematology and Oncology | Admitting: Hematology and Oncology

## 2013-09-09 ENCOUNTER — Telehealth: Payer: Self-pay | Admitting: *Deleted

## 2013-09-09 ENCOUNTER — Other Ambulatory Visit: Payer: Self-pay | Admitting: Family Medicine

## 2013-09-09 ENCOUNTER — Ambulatory Visit (HOSPITAL_BASED_OUTPATIENT_CLINIC_OR_DEPARTMENT_OTHER): Payer: Medicare Other | Admitting: Hematology and Oncology

## 2013-09-09 ENCOUNTER — Telehealth: Payer: Self-pay

## 2013-09-09 ENCOUNTER — Other Ambulatory Visit (HOSPITAL_BASED_OUTPATIENT_CLINIC_OR_DEPARTMENT_OTHER): Payer: Medicare Other

## 2013-09-09 ENCOUNTER — Ambulatory Visit (HOSPITAL_BASED_OUTPATIENT_CLINIC_OR_DEPARTMENT_OTHER): Payer: Medicare Other

## 2013-09-09 ENCOUNTER — Telehealth: Payer: Self-pay | Admitting: Hematology and Oncology

## 2013-09-09 VITALS — BP 148/67 | HR 84 | Temp 98.3°F | Resp 18 | Ht 66.0 in | Wt 104.4 lb

## 2013-09-09 DIAGNOSIS — D469 Myelodysplastic syndrome, unspecified: Secondary | ICD-10-CM

## 2013-09-09 DIAGNOSIS — D61818 Other pancytopenia: Secondary | ICD-10-CM

## 2013-09-09 DIAGNOSIS — C9200 Acute myeloblastic leukemia, not having achieved remission: Secondary | ICD-10-CM

## 2013-09-09 DIAGNOSIS — D649 Anemia, unspecified: Secondary | ICD-10-CM | POA: Insufficient documentation

## 2013-09-09 DIAGNOSIS — N390 Urinary tract infection, site not specified: Secondary | ICD-10-CM

## 2013-09-09 DIAGNOSIS — H9190 Unspecified hearing loss, unspecified ear: Secondary | ICD-10-CM

## 2013-09-09 LAB — IRON AND TIBC CHCC
%SAT: UNDETERMINED % (ref 21–?)
IRON: 329 ug/dL — AB (ref 41–142)
TIBC: 195 ug/dL — AB (ref 236–444)
UIBC: UNDETERMINED ug/dL (ref 120–384)

## 2013-09-09 LAB — MANUAL DIFFERENTIAL
ALC: 1 10*3/uL (ref 0.9–3.3)
ANC (CHCC MAN DIFF): 2.3 10*3/uL (ref 1.5–6.5)
BAND NEUTROPHILS: 24 % — AB (ref 0–10)
Basophil: 0 % (ref 0–2)
Blasts: 4 % — ABNORMAL HIGH (ref 0–0)
EOS: 1 % (ref 0–7)
LYMPH: 27 % (ref 14–49)
METAMYELOCYTES PCT: 10 % — AB (ref 0–0)
MONO: 2 % (ref 0–14)
Myelocytes: 10 % — ABNORMAL HIGH (ref 0–0)
NRBC: 0 % (ref 0–0)
Other Cell: 0 % (ref 0–0)
PLT EST: ADEQUATE
PROMYELO: 1 % — AB (ref 0–0)
SEG: 21 % — ABNORMAL LOW (ref 38–77)
Variant Lymph: 0 % (ref 0–0)

## 2013-09-09 LAB — CBC WITH DIFFERENTIAL/PLATELET
HEMATOCRIT: 27.6 % — AB (ref 34.8–46.6)
HEMOGLOBIN: 9.2 g/dL — AB (ref 11.6–15.9)
MCH: 28.8 pg (ref 25.1–34.0)
MCHC: 33.3 g/dL (ref 31.5–36.0)
MCV: 86.5 fL (ref 79.5–101.0)
PLATELETS: 152 10*3/uL (ref 145–400)
RBC: 3.19 10*6/uL — ABNORMAL LOW (ref 3.70–5.45)
RDW: 15.9 % — ABNORMAL HIGH (ref 11.2–14.5)
WBC: 3.6 10*3/uL — AB (ref 3.9–10.3)

## 2013-09-09 LAB — URINALYSIS, MICROSCOPIC - CHCC
Bilirubin (Urine): NEGATIVE
Glucose: 2000 mg/dL
Ketones: NEGATIVE mg/dL
Leukocyte Esterase: NEGATIVE
NITRITE: NEGATIVE
PH: 6.5 (ref 4.6–8.0)
Protein: 100 mg/dL
RBC / HPF: NEGATIVE (ref 0–2)
Specific Gravity, Urine: 1.015 (ref 1.003–1.035)
UROBILINOGEN UR: 0.2 mg/dL (ref 0.2–1)

## 2013-09-09 LAB — COMPREHENSIVE METABOLIC PANEL (CC13)
ALBUMIN: 3.3 g/dL — AB (ref 3.5–5.0)
ALT: 21 U/L (ref 0–55)
ANION GAP: 13 meq/L — AB (ref 3–11)
AST: 21 U/L (ref 5–34)
Alkaline Phosphatase: 62 U/L (ref 40–150)
BUN: 28.9 mg/dL — AB (ref 7.0–26.0)
CALCIUM: 9.8 mg/dL (ref 8.4–10.4)
CHLORIDE: 104 meq/L (ref 98–109)
CO2: 22 meq/L (ref 22–29)
CREATININE: 1.4 mg/dL — AB (ref 0.6–1.1)
GLUCOSE: 197 mg/dL — AB (ref 70–140)
POTASSIUM: 4.3 meq/L (ref 3.5–5.1)
Sodium: 139 mEq/L (ref 136–145)
Total Bilirubin: 0.55 mg/dL (ref 0.20–1.20)
Total Protein: 7.2 g/dL (ref 6.4–8.3)

## 2013-09-09 LAB — PHOSPHORUS: Phosphorus: 3.1 mg/dL (ref 2.3–4.6)

## 2013-09-09 LAB — MAGNESIUM (CC13): MAGNESIUM: 2.5 mg/dL (ref 1.5–2.5)

## 2013-09-09 LAB — FERRITIN CHCC

## 2013-09-09 NOTE — Telephone Encounter (Signed)
Referral is ordered

## 2013-09-09 NOTE — Telephone Encounter (Signed)
, °

## 2013-09-09 NOTE — Telephone Encounter (Signed)
Pt was seen at the cancer center today. Pt urine glucose 2000 today was 1000 two months ago. Pt does have appointment to come in tomorrow 5/15.

## 2013-09-09 NOTE — Progress Notes (Signed)
OFFICE PROGRESS NOTE  [place], MD [place] [address]  CHIEF COMPLAINT:  F/U visit for Anemia and iron overload  DIAGNOSIS: 80 year old female with severe and profound anemia due to a primary bone marrow problem-MDS or ?AML  CURRENT THERAPY:Supportive care  PRIOR THERAPY: As per previously documented Dr. Laurelyn Sickle note  #1. She was on Procrit every 2 weeks unfortunately this was not effective and discontinued   #3 patient is transfusion dependent anemia in the setting of myelodysplastic syndrome cannot exclude conversion to an acute leukemia.  #4 Patient received 3 cycles of Decitabine chemotherapy, her third cycle was complicated by febrile neutropenia and pneumonia causing hospitalization.  She is at present on supportive therapy   INTERVAL HISTORY: Meredith Rodriguez 80 y.o. female returns for follow up today for her MDS/?AML . Since the time of last visit she's been relatively doing well. She complains of fatigue and also her only new symptom is on having muscle cramps in both the legs started a week ago. She  Her glucose levels in the urine as the 2000. Her labs have been forwarded to the current care physician and communicated with the office for her arm for her to be seen in the clinic today.  Her Exjade was increased to 1000 mg once daily  Since March 2015 for her iron overload her last iron indices were performed on 03/02//2015 and that revealed a ferritin of 4165. Ferritin level performed May 14 ,2015 increased 4852. She complains of mild fatigue,  other than that denis any shortness of breath. she says that she's eating well.  She was treated with antibiotics for UTI recently. She does not have any increase in frequency, or dysurea She denies any fever, shortness of breath, chest pains, palpitations, headaches, blurred vision, blood in the stool or blood in the urine.   MEDICAL HISTORY: Past Medical History  Diagnosis Date  . Hypertension   .  Diabetes mellitus   . Hyperkalemia   . IBS (irritable bowel syndrome)   . Hypercholesterolemia   . Cervical radiculopathy   . Anemia   . Cancer     leukemia  . Leukemia     ALLERGIES:  is allergic to amoxicillin; cephalexin; metformin and related; phenergan; and procardia.  MEDICATIONS:  Current Outpatient Prescriptions  Medication Sig Dispense Refill  . amLODipine (NORVASC) 10 MG tablet Take 1 tablet (10 mg total) by mouth daily.  90 tablet  3  . aspirin 81 MG tablet Take 81 mg by mouth daily.      . B-D UF III MINI PEN NEEDLES 31G X 5 MM MISC USE WITH LANTUS PEN DAILY AS INSTRUCTED  100 each  2  . cholecalciferol (VITAMIN D) 1000 UNITS tablet Take 1,000 Units by mouth daily.      Marland Kitchen EXJADE 500 MG disintegrating tablet TAKE 2 TABLETS BY MOUTH DAILY BEFORE BREAKFAST  60 tablet  3  . FREESTYLE LITE test strip USE TO TEST BLOOD SUGAR 4 TIMES DAILY AS DIRECTED  30 each  10  . insulin lispro (HUMALOG KWIKPEN) 100 UNIT/ML SOPN Inject 4 Units into the skin 2 (two) times daily with a meal. Inject 4 units twice daily into skin if blood glucose is over 100 at lunch and dinner      . Lancets (FREESTYLE) lancets 1 each by Other route 2 (two) times daily.       Marland Kitchen LANTUS SOLOSTAR 100 UNIT/ML SOPN INJECT 10 UNITS INTO THE SKIN DAILY  15  mL  3  . lidocaine-prilocaine (EMLA) cream Apply topically as needed.  30 g  4  . metoprolol (LOPRESSOR) 50 MG tablet Take 1 tablet (50 mg total) by mouth 2 (two) times daily.  180 tablet  3  . Omega-3 Fatty Acids (FISH OIL) 1000 MG CAPS Take 1,000 mg by mouth daily.       . simvastatin (ZOCOR) 5 MG tablet Take 5 mg by mouth at bedtime.      . [DISCONTINUED] glimepiride (AMARYL) 2 MG tablet Take 1 tablet (2 mg total) by mouth every morning.  30 tablet  11   Current Facility-Administered Medications  Medication Dose Route Frequency Provider Last Rate Last Dose  . TDaP (BOOSTRIX) injection 0.5 mL  0.5 mL Intramuscular Once [name], MD        SURGICAL  HISTORY:  Past Surgical History  Procedure Laterality Date  . Varicose veins      surgey 1959  . Tonsilectomy, adenoidectomy, bilateral myringotomy and tubes    . Esophagogastroduodenoscopy  04/01/2011    Procedure: ESOPHAGOGASTRODUODENOSCOPY (EGD);  Surgeon: Missy Sabins, MD;  Location: [facility] ENDOSCOPY;  Service: Endoscopy;  Laterality: N/A;    REVIEW OF SYSTEMS:   A 14 point review of systems was conducted and is otherwise negative except for what is noted above.      PHYSICAL EXAMINATION: BP 148/67  Pulse 84  Temp(Src) 98.3 F (36.8 C) (Oral)  Resp 18  Ht 5\' 6"  (1.676 m)  Wt 104 lb 6.4 oz (47.356 kg)  BMI 16.86 kg/m2 General: Patient is a well appearing female in no acute distress HEENT: PERRLA, sclerae anicteric no conjunctival pallor, MMM no erythema or exudate Neck: supple, no lymphadenopathy Breast exam: No masses felt Lungs: clear to auscultation bilaterally, no wheezes, rhonchi, or rales Cardiovascular: regular rate rhythm, S1, S2, no murmurs, rubs or gallops Abdomen: Soft, non-tender, non-distended, normoactive bowel sounds, no HSM Extremities: warm and well perfused, no clubbing, cyanosis, or edema Skin: No rashes.    Neuro: Non-focal ECOG PERFORMANCE STATUS: 1  LABORATORY DATA: Lab Results  Component Value Date   WBC 3.6* 09/09/2013   HGB 9.2* 09/09/2013   HCT 27.6* 09/09/2013   MCV 86.5 09/09/2013   PLT 152 09/09/2013      Chemistry      Component Value Date/Time   NA 139 09/09/2013 0950   NA 135 02/10/2013 0620   K 4.3 09/09/2013 0950   K 4.1 02/10/2013 0620   CL 101 02/10/2013 0620   CL 99 06/01/2012 1028   CO2 22 09/09/2013 0950   CO2 27 02/10/2013 0620   BUN 28.9* 09/09/2013 0950   BUN 15 02/10/2013 0620   CREATININE 1.4* 09/09/2013 0950   CREATININE 0.62 02/10/2013 0620   CREATININE 0.69 07/18/2010 1202   GLU 215* 02/26/2012 1255      Component Value Date/Time   CALCIUM 9.8 09/09/2013 0950   CALCIUM 8.4 02/10/2013 0620   ALKPHOS 62 09/09/2013 0950    ALKPHOS 55 02/07/2013 2047   AST 21 09/09/2013 0950   AST 33 02/07/2013 2047   ALT 21 09/09/2013 0950   ALT 34 02/07/2013 2047   BILITOT 0.55 09/09/2013 0950   BILITOT 0.9 02/07/2013 2047       RADIOGRAPHIC STUDIES:  No results found.  ASSESSMENT/PLAN: 80 year old female with:  1.  MDS with refractory  anemia not responsive to Procrit   2. Patient was started on Decitabine chemotherapy.  She received three cycles of this, and then had an  episode of febrile neutropenia resulting in pneumonia and hospitalization.  Her chemotherapy has been discontinued due to complications of febrile neutropenia and pneumonia. Continue supportive therapy and as needed blood transfusions.  Monotor once every 2 weeks CBC and diff as scheduled. I have ordered the flow cytometry in the peripheral blood to assess immature cells  3. Iron overload:  She is on Exjade  1000 mg  by mouth once daily  with close monitoring of liver functions. Repeat  ferritin level revealed elevated ferritin level when compared to before . We'll increase Exjade to 1500mg  po once daily alternate with 1053milligrams with careful monitoring of liver functions/renal functions.  repeat CMP once every 2 weeks for one month to assess for stability on increased dose of Exjade followed by once monthly. Consider Desferral 1-2 g injection after each blood transfusion.   5. she is up-to-date with her flu vaccine and a pneumonia vaccine  Next f/u visit in one Month with CBC and diff , CMP in 2 weeks  and ferritin and iron saturation In 2 months   All questions were answered. The patient knows to call the clinic with any problems, questions or concerns. We can certainly see the patient much sooner if necessary.  I spent 20 minutes counseling the patient face to face. The total time spent in the appointment was 30 minutes.    Wilmon Arms , MD Medical/Oncology [place] [phone] (Office)

## 2013-09-09 NOTE — Telephone Encounter (Signed)
Urine glucose routed to Dr Elease Hashimoto & office notified to see if pt can be seen to address this issue today per Dr Earnest Conroy.

## 2013-09-10 ENCOUNTER — Telehealth: Payer: Self-pay | Admitting: Hematology and Oncology

## 2013-09-10 ENCOUNTER — Encounter: Payer: Self-pay | Admitting: Family Medicine

## 2013-09-10 ENCOUNTER — Ambulatory Visit (INDEPENDENT_AMBULATORY_CARE_PROVIDER_SITE_OTHER): Payer: Medicare Other | Admitting: Family Medicine

## 2013-09-10 ENCOUNTER — Other Ambulatory Visit: Payer: Self-pay | Admitting: Hematology and Oncology

## 2013-09-10 ENCOUNTER — Telehealth: Payer: Self-pay | Admitting: *Deleted

## 2013-09-10 VITALS — BP 138/68 | HR 79 | Wt 106.0 lb

## 2013-09-10 DIAGNOSIS — E119 Type 2 diabetes mellitus without complications: Secondary | ICD-10-CM

## 2013-09-10 DIAGNOSIS — E1149 Type 2 diabetes mellitus with other diabetic neurological complication: Secondary | ICD-10-CM

## 2013-09-10 DIAGNOSIS — E1140 Type 2 diabetes mellitus with diabetic neuropathy, unspecified: Secondary | ICD-10-CM

## 2013-09-10 DIAGNOSIS — E1142 Type 2 diabetes mellitus with diabetic polyneuropathy: Secondary | ICD-10-CM

## 2013-09-10 DIAGNOSIS — C9200 Acute myeloblastic leukemia, not having achieved remission: Secondary | ICD-10-CM

## 2013-09-10 DIAGNOSIS — E1165 Type 2 diabetes mellitus with hyperglycemia: Secondary | ICD-10-CM

## 2013-09-10 LAB — HEMOGLOBIN A1C: Hgb A1c MFr Bld: 8.3 % — ABNORMAL HIGH (ref 4.6–6.5)

## 2013-09-10 MED ORDER — GABAPENTIN 100 MG PO CAPS: 100.0000 mg | ORAL_CAPSULE | Freq: Three times a day (TID) | ORAL | Status: DC

## 2013-09-10 NOTE — Telephone Encounter (Signed)
Spoke with dtr. Cathy.  Let her know that Magnesium and phosphorous levels are normal.  Her ferritin is increased and Dr. Andria Frames would like her to increase her exjade.  Currently she is taking 2 tabs daily and she would like to increase that to 2 tabs alternating with 3 tabs.  Dtr. Verbalized understanding.  Also discussed increasing water intake to 2 liters per day.  Dtr. Said she would try but that it is very hard.  Dtr is also asking about cytometry reports.  Dr. Andria Frames spoke with dtr. At this time and explained slight increase in abnormal cells from 4% to 6%.

## 2013-09-10 NOTE — Telephone Encounter (Signed)
per staff message from [place] pt to be scheduled w/her 5/21 @ 8:30am with labs. lmonvm for pt and mailed schedule.

## 2013-09-10 NOTE — Patient Instructions (Addendum)
Check blood sugars at different times of day and alternate between pre-meal and 2 hours after meal readings and record May titrate Lantus insulin up 2 units every 3 days until fasting sugars are consistently < 130.   Start gabapentin one at night and may increase up to one three times daily over one week.

## 2013-09-10 NOTE — Progress Notes (Signed)
Pre visit review using our clinic review tool, if applicable. No additional management support is needed unless otherwise documented below in the visit note. 

## 2013-09-10 NOTE — Progress Notes (Signed)
   Subjective:    Patient ID: Meredith Rodriguez, female    DOB: 04/06/1926, 80 y.o.   MRN: [id number]  HPI Patient here for followup regarding diabetes. She had recent urinalysis through oncology center with 2000 mg/dL of glucose. Last hemoglobin A1c 9.0% back in February. She is currently taking Lantus 10 units once daily and she takes Humalog 4 units before supper and lunch. She is not checking consistent fasting blood sugars or postprandials. She had pre-lunch blood sugar of 68 yesterday. No recent hypoglycemic symptoms. No significant increased urine frequency or thirst.  She also complains of bilateral leg pain especially at night. Burning quality. 6/10 severity. No claudication symptoms. Previously took lyrica. Symptoms mostly at rest.  Past Medical History  Diagnosis Date  . Hypertension   . Diabetes mellitus   . Hyperkalemia   . IBS (irritable bowel syndrome)   . Hypercholesterolemia   . Cervical radiculopathy   . Anemia   . Cancer     leukemia  . Leukemia    Past Surgical History  Procedure Laterality Date  . Varicose veins      surgey 1959  . Tonsilectomy, adenoidectomy, bilateral myringotomy and tubes    . Esophagogastroduodenoscopy  04/01/2011    Procedure: ESOPHAGOGASTRODUODENOSCOPY (EGD);  Surgeon: Missy Sabins, MD;  Location[facility] ENDOSCOPY;  Service: Endoscopy;  Laterality: N/A;    reports that she has never smoked. She has never used smokeless tobacco. She reports that she does not drink alcohol or use illicit drugs. family history includes Cancer in her daughter, sister, sister, and sister; Diabetes in her sister; Hypertension in her father; Stroke in her mother. Allergies  Allergen Reactions  . Amoxicillin [Amoxicillin]     Upset stomach  . Cephalexin Nausea Only  . Metformin And Related     Gi problems  . Phenergan [Promethazine Hcl] Other (See Comments)    Unknown   . Procardia [Nifedipine]     dizziness      Review of Systems  Constitutional: Negative for  appetite change and unexpected weight change.  Respiratory: Negative for shortness of breath.   Cardiovascular: Negative for chest pain.  Endocrine: Negative for polydipsia and polyuria.       Objective:   Physical Exam  Constitutional: She appears well-developed and well-nourished.  Cardiovascular: Normal rate.   Pulmonary/Chest: Effort normal and breath sounds normal. No respiratory distress. She has no wheezes. She has no rales.  Musculoskeletal: She exhibits no edema.  Skin: No rash noted.          Assessment & Plan:  #1 type 2 diabetes. History of recent poor control. We've recommended checking blood sugars several times daily with alternate times and recording. Blood sugar diary given. We've recommended alternating between fasting and pre-meal and postmeal blood sugars and reassess at followup in a couple of weeks. Slow titration regimen given for Lantus. Repeat A1c today #2 bilateral diabetic neuropathy pain. Start low-dose gabapentin 100 mg each bedtime and slowly increased to 100 mg 3 times a day and reassess in a couple weeks

## 2013-09-12 DIAGNOSIS — E1140 Type 2 diabetes mellitus with diabetic neuropathy, unspecified: Secondary | ICD-10-CM | POA: Insufficient documentation

## 2013-09-13 LAB — FLOW CYTOMETRY

## 2013-09-16 ENCOUNTER — Telehealth: Payer: Self-pay | Admitting: Hematology and Oncology

## 2013-09-16 ENCOUNTER — Ambulatory Visit (HOSPITAL_BASED_OUTPATIENT_CLINIC_OR_DEPARTMENT_OTHER): Payer: Medicare Other | Admitting: Hematology and Oncology

## 2013-09-16 ENCOUNTER — Encounter: Payer: Self-pay | Admitting: *Deleted

## 2013-09-16 ENCOUNTER — Other Ambulatory Visit (HOSPITAL_BASED_OUTPATIENT_CLINIC_OR_DEPARTMENT_OTHER): Payer: Medicare Other

## 2013-09-16 ENCOUNTER — Other Ambulatory Visit: Payer: Self-pay | Admitting: Medical Oncology

## 2013-09-16 ENCOUNTER — Ambulatory Visit (HOSPITAL_BASED_OUTPATIENT_CLINIC_OR_DEPARTMENT_OTHER): Payer: Medicare Other

## 2013-09-16 ENCOUNTER — Ambulatory Visit (HOSPITAL_COMMUNITY)
Admission: RE | Admit: 2013-09-16 | Discharge: 2013-09-16 | Disposition: A | Discharge status: Home / Self Care | Payer: Medicare Other | Source: Ambulatory Visit | Attending: Oncology | Admitting: Oncology

## 2013-09-16 VITALS — BP 141/38 | HR 69 | Temp 97.9°F | Resp 16

## 2013-09-16 VITALS — BP 133/45 | HR 74 | Temp 97.5°F | Resp 20 | Ht 66.0 in | Wt 105.0 lb

## 2013-09-16 DIAGNOSIS — D469 Myelodysplastic syndrome, unspecified: Secondary | ICD-10-CM

## 2013-09-16 DIAGNOSIS — D649 Anemia, unspecified: Secondary | ICD-10-CM

## 2013-09-16 DIAGNOSIS — C9200 Acute myeloblastic leukemia, not having achieved remission: Secondary | ICD-10-CM

## 2013-09-16 LAB — CBC & DIFF AND RETIC
BASO%: 0.7 % (ref 0.0–2.0)
Basophils Absolute: 0 10*3/uL (ref 0.0–0.1)
EOS%: 0.4 % (ref 0.0–7.0)
Eosinophils Absolute: 0 10*3/uL (ref 0.0–0.5)
HCT: 23.4 % — ABNORMAL LOW (ref 34.8–46.6)
HGB: 7.8 g/dL — ABNORMAL LOW (ref 11.6–15.9)
Immature Retic Fract: 10 % (ref 1.60–10.00)
LYMPH%: 30.2 % (ref 14.0–49.7)
MCH: 28.5 pg (ref 25.1–34.0)
MCHC: 33.3 g/dL (ref 31.5–36.0)
MCV: 85.4 fL (ref 79.5–101.0)
MONO#: 0.2 10*3/uL (ref 0.1–0.9)
MONO%: 6 % (ref 0.0–14.0)
NEUT#: 1.8 10*3/uL (ref 1.5–6.5)
NEUT%: 62.7 % (ref 38.4–76.8)
RBC: 2.74 10*6/uL — ABNORMAL LOW (ref 3.70–5.45)
RDW: 16 % — AB (ref 11.2–14.5)
Retic %: <0.38 % — ABNORMAL LOW (ref 0.5–1.5)
Retic Ct Abs: 5.48 10*3/uL — ABNORMAL LOW (ref 33.70–90.70)
WBC: 2.9 10*3/uL — ABNORMAL LOW (ref 3.9–10.3)
lymph#: 0.9 10*3/uL (ref 0.9–3.3)
nRBC: 0 % (ref 0–0)

## 2013-09-16 LAB — COMPREHENSIVE METABOLIC PANEL (CC13)
ALT: 18 U/L (ref 0–55)
ANION GAP: 10 meq/L (ref 3–11)
AST: 19 U/L (ref 5–34)
Albumin: 3.3 g/dL — ABNORMAL LOW (ref 3.5–5.0)
Alkaline Phosphatase: 70 U/L (ref 40–150)
BUN: 22.9 mg/dL (ref 7.0–26.0)
CALCIUM: 9.3 mg/dL (ref 8.4–10.4)
CHLORIDE: 104 meq/L (ref 98–109)
CO2: 20 meq/L — AB (ref 22–29)
CREATININE: 1.4 mg/dL — AB (ref 0.6–1.1)
GLUCOSE: 239 mg/dL — AB (ref 70–140)
Potassium: 3.8 mEq/L (ref 3.5–5.1)
Sodium: 134 mEq/L — ABNORMAL LOW (ref 136–145)
Total Bilirubin: 0.61 mg/dL (ref 0.20–1.20)
Total Protein: 7 g/dL (ref 6.4–8.3)

## 2013-09-16 LAB — TECHNOLOGIST REVIEW

## 2013-09-16 LAB — IRON AND TIBC CHCC
%SAT: UNDETERMINED % (ref 21–?)
Iron: 367 ug/dL — ABNORMAL HIGH (ref 41–142)
TIBC: 203 ug/dL — ABNORMAL LOW (ref 236–444)
UIBC: UNDETERMINED ug/dL (ref 120–384)

## 2013-09-16 LAB — HOLD TUBE, BLOOD BANK

## 2013-09-16 LAB — LACTATE DEHYDROGENASE (CC13): LDH: 417 U/L — ABNORMAL HIGH (ref 125–245)

## 2013-09-16 MED ORDER — SODIUM CHLORIDE 0.9 % IV SOLN
250.0000 mL | Freq: Once | INTRAVENOUS | Status: AC
  Administered 2013-09-16: 250 mL via INTRAVENOUS

## 2013-09-16 MED ORDER — SODIUM CHLORIDE 0.9 % IJ SOLN
10.0000 mL | INTRAMUSCULAR | Status: AC | PRN
  Administered 2013-09-16: 10 mL
  Filled 2013-09-16: qty 10

## 2013-09-16 MED ORDER — HEPARIN SOD (PORK) LOCK FLUSH 100 UNIT/ML IV SOLN
500.0000 [IU] | Freq: Every day | INTRAVENOUS | Status: AC | PRN
  Administered 2013-09-16: 500 [IU]
  Filled 2013-09-16: qty 5

## 2013-09-16 NOTE — Progress Notes (Signed)
[place] FOLLOW-UP progress notes  Patient Care Team: [name], MD as PCP - General  CHIEF COMPLAINTS/PURPOSE OF VISIT:  Transfusion-dependent anemia with increased circulating peripheral blasts.  HISTORY OF PRESENTING ILLNESS:  Meredith Rodriguez 80 y.o. female was transferred to my care after her prior physician has left.  I reviewed the patient's records extensive and collaborated the history with the patient & her daughter. The patient is a very poor historian.  Summary of her history is as follows: #30 March 2011, she was noted to have anemia with associated thrombocytosis. She became transfusion dependent and was referred to see a hematologist here for further evaluation. The patient has lack of reticulocytosis with associated elevated erythropoietin level of 197. #2 on 05/28/2011, bone marrow aspirate and biopsy was hypercellular with increased myelofibrosis. There were no evidence of increased blasts. #3 The patient was started on erythropoietin stimulating agent with Procrit injections 40,000 units every 2 weeks beginning of 11/20/2011 along with transfusion support. By October 2013, Procrit was deemed ineffective. She was subsequently recommended to consider chemotherapy. #4 On 02/19/2012, repeat bone marrow aspirate and biopsy show persistent myelofibrosis and hypercellular marrow. It was difficult to estimate percentage of blastic cells to do sampling error. By flow cytometry, 10% of abnormal cells were detected. #5 From 02/24/2012 to 05/08/2012, she received 3 cycles of decitabine. The last cycle of treatment was complicated by febrile neutropenia and pneumonia requiring hospitalization. Decision was made to discontinue chemotherapy and to return to clinic for transfusion support only. #6 Due to severe iron overload, she was started on Exjade around September 2014 to present.  The patient is seen today as part of her followup. Her most recent flow cytometry  performed a week ago showed increased circulating blasts cells. She remains profoundly fatigued. Her appetite is poor. She denies any chest pain, dizziness or shortness of breath. She denies recent infection. She complained of pain from peripheral neuropathy affecting from her knees down to her toes.  MEDICAL HISTORY:  Past Medical History  Diagnosis Date  . Hypertension   . Diabetes mellitus   . Hyperkalemia   . IBS (irritable bowel syndrome)   . Hypercholesterolemia   . Cervical radiculopathy   . Anemia   . Cancer     leukemia  . Leukemia     SURGICAL HISTORY: Past Surgical History  Procedure Laterality Date  . Varicose veins      surgey 1959  . Tonsilectomy, adenoidectomy, bilateral myringotomy and tubes    . Esophagogastroduodenoscopy  04/01/2011    Procedure: ESOPHAGOGASTRODUODENOSCOPY (EGD);  Surgeon: Missy Sabins, MD;  Location: [facility] ENDOSCOPY;  Service: Endoscopy;  Laterality: N/A;    SOCIAL HISTORY: History   Social History  . Marital Status: Married    Spouse Name: N/A    Number of Children: N/A  . Years of Education: N/A   Occupational History  . Not on file.   Social History Main Topics  . Smoking status: Never Smoker   . Smokeless tobacco: Never Used  . Alcohol Use: No  . Drug Use: No  . Sexual Activity: No   Other Topics Concern  . Not on file   Social History Narrative  . No narrative on file    FAMILY HISTORY: Family History  Problem Relation Age of Onset  . Stroke Mother   . Hypertension Father   . Cancer Sister   . Cancer Sister   . Diabetes Sister   . Cancer Sister  colon cancer  . Cancer Daughter     breast cancer    ALLERGIES:  is allergic to amoxicillin; cephalexin; metformin and related; phenergan; and procardia.  MEDICATIONS:  Current Outpatient Prescriptions  Medication Sig Dispense Refill  . amLODipine (NORVASC) 10 MG tablet Take 1 tablet (10 mg total) by mouth daily.  90 tablet  3  . aspirin 81 MG tablet Take 81  mg by mouth daily.      . B-D UF III MINI PEN NEEDLES 31G X 5 MM MISC USE WITH LANTUS PEN DAILY AS INSTRUCTED  100 each  2  . cholecalciferol (VITAMIN D) 1000 UNITS tablet Take 1,000 Units by mouth daily.      Marland Kitchen EXJADE 500 MG disintegrating tablet TAKE 2 TABLETS BY MOUTH DAILY BEFORE BREAKFAST  60 tablet  3  . FREESTYLE LITE test strip USE TO TEST BLOOD SUGAR 4 TIMES DAILY AS DIRECTED  30 each  10  . gabapentin (NEURONTIN) 100 MG capsule Take 1 capsule (100 mg total) by mouth 3 (three) times daily.  90 capsule  5  . insulin lispro (HUMALOG KWIKPEN) 100 UNIT/ML SOPN Inject 4 Units into the skin 2 (two) times daily with a meal. Inject 4 units twice daily into skin if blood glucose is over 100 at lunch and dinner      . Lancets (FREESTYLE) lancets 1 each by Other route 2 (two) times daily.       Marland Kitchen LANTUS SOLOSTAR 100 UNIT/ML SOPN INJECT 10 UNITS INTO THE SKIN DAILY  15 mL  3  . lidocaine-prilocaine (EMLA) cream Apply topically as needed.  30 g  4  . metoprolol (LOPRESSOR) 50 MG tablet Take 1 tablet (50 mg total) by mouth 2 (two) times daily.  180 tablet  3  . Omega-3 Fatty Acids (FISH OIL) 1000 MG CAPS Take 1,000 mg by mouth daily.       . simvastatin (ZOCOR) 5 MG tablet Take 5 mg by mouth at bedtime.      . [DISCONTINUED] glimepiride (AMARYL) 2 MG tablet Take 1 tablet (2 mg total) by mouth every morning.  30 tablet  11   Current Facility-Administered Medications  Medication Dose Route Frequency Provider Last Rate Last Dose  . TDaP (BOOSTRIX) injection 0.5 mL  0.5 mL Intramuscular Once [name], MD        REVIEW OF SYSTEMS:   Constitutional: Denies fevers, chills or abnormal night sweats Eyes: Denies blurriness of vision, double vision or watery eyes Ears, nose, mouth, throat, and face: Denies mucositis or sore throat Respiratory: Denies cough, dyspnea or wheezes Cardiovascular: Denies palpitation, chest discomfort or lower extremity swelling Gastrointestinal:  Denies nausea,  heartburn or change in bowel habits Skin: Denies abnormal skin rashes Lymphatics: Denies new lymphadenopathy or easy bruisin Behavioral/Psych: Mood is stable, no new changes  All other systems were reviewed with the patient and are negative.  PHYSICAL EXAMINATION: ECOG PERFORMANCE STATUS: 2 - Symptomatic, <50% confined to bed  Filed Vitals:   09/16/13 0851  BP: 133/45  Pulse: 74  Temp: 97.5 F (36.4 C)  Resp: 20   Filed Weights   09/16/13 0851  Weight: 105 lb (47.628 kg)    GENERAL:alert, no distress and comfortable. she looks thin but not cachectic SKIN: skin color, texture, turgor are normal, no rashes or significant lesions. Significant bruising is noted. EYES: normal, conjunctiva are pink and non-injected, sclera clear OROPHARYNX:no exudate, normal lips, buccal mucosa, and tongue  NECK: supple, thyroid normal size, non-tender, without nodularity  LYMPH:  no palpable lymphadenopathy in the cervical, axillary or inguinal LUNGS: clear to auscultation and percussion with normal breathing effort HEART: regular rate & rhythm and no murmurs without lower extremity edema ABDOMEN:abdomen soft, non-tender and normal bowel sounds. Unable to appreciate splenomegaly Musculoskeletal:no cyanosis of digits and no clubbing  PSYCH: alert & oriented x 3 with fluent speech NEURO: no focal motor/sensory deficits  LABORATORY DATA:  I have reviewed the data as listed Lab Results  Component Value Date   WBC 2.9* 09/16/2013   HGB 7.8* 09/16/2013   HCT 23.4* 09/16/2013   MCV 85.4 09/16/2013   PLT 158 Large & giant platelets 09/16/2013    Recent Labs  02/08/13 0510 02/09/13 0345 02/10/13 0620  08/25/13 0829 09/09/13 0950 09/16/13 0828  NA 131* 134* 135  < > 138 139 134*  K 4.1 3.8 4.1  < > 4.1 4.3 3.8  CL 96 100 101  --   --   --   --   CO2 28 24 27   < > 23 22 20*  GLUCOSE 165* 174* 196*  < > 144* 197* 239*  BUN 13 16 15   < > 35.3* 28.9* 22.9  CREATININE 0.60 0.62 0.62  < > 1.5* 1.4*  1.4*  CALCIUM 9.0 8.5 8.4  < > 10.0 9.8 9.3  GFRNONAA 81* 80* 80*  --   --   --   --   GFRAA >90 >90 >90  --   --   --   --   PROT  --   --   --   < > 7.2 7.2 7.0  ALBUMIN  --   --   --   < > 3.2* 3.3* 3.3*  AST  --   --   --   < > 19 21 19   ALT  --   --   --   < > 20 21 18   ALKPHOS  --   --   --   < > 65 62 70  BILITOT  --   --   --   < > 0.59 0.55 0.61  < > = values in this interval not displayed.  ASSESSMENT & PLAN:  #1 myelodysplastic syndrome #2 transfusion-dependent anemia #3 significant iron overload After reviewing her records extensively, I concluded that she probably had MPN/MDS, currently transfusion dependent for over 3 years. Previously, she was deemed inappropriate to continue on erythropoietin stimulating agent after failure of trial of Procrit at 40,000 units every other week for approximately 3 months. I do not think she had failed erythropoietin stimulating agent. I have redrawn repeat serum erythropoietin level today. We could consideration for Aranesp injection. Most importantly, I felt that the patient should undergo another repeat bone marrow aspirate and biopsy to reassess the bone marrow situation, especially with increased peripheral blood circulating blasts, indicative that she may have transformed to acute leukemia. If the patient and family members are at peace to undergo transfusion support only, then I would recommend reducing the transfusion pressure threshold to only transfuse 1 unit at a time. Previously, it is clear to me that she was transfused excessively, especially given her petite frame and her age. She should also consider enrollment to hospice due to her terminally ill situation.  On the other hand, if the patient is in agreement to proceed with systemic chemotherapy, I would recommend repeat bone marrow aspirate and biopsy. She has not been treated with Azacitadine which would be an excellent choice in this situation. In  addition, I would consider  sending off additional molecular studies to determine if she would qualify for use of Revlimid or Imatinib.  I spent a lot of time with the patient and her daughter. She will call me next week once she has determined the next course of action.  In the meantime, I will proceed to give her blood transfusion. I have told to discontinue Exjade.   Orders Placed This Encounter  Procedures  . CBC & Diff and Retic    Standing Status: Future     Number of Occurrences:      Standing Expiration Date: 09/16/2014  . Comprehensive metabolic panel    Standing Status: Future     Number of Occurrences:      Standing Expiration Date: 09/16/2014  . Hold Tube, Blood Bank    Standing Status: Future     Number of Occurrences:      Standing Expiration Date: 09/16/2014    All questions were answered. The patient knows to call the clinic with any problems, questions or concerns. I spent 40 minutes counseling the patient face to face. The total time spent in the appointment was 55 minutes and more than 50% was on counseling.     [name], MD 09/16/2013 8:51 PM

## 2013-09-16 NOTE — Patient Instructions (Signed)
Blood Transfusion  A blood transfusion replaces your blood or some of its parts. Blood is replaced when you have lost blood because of surgery, an accident, or for severe blood conditions like anemia. You can donate blood to be used on yourself if you have a planned surgery. If you lose blood during that surgery, your own blood can be given back to you. Any blood given to you is checked to make sure it matches your blood type. Your temperature, blood pressure, and heart rate (vital signs) will be checked often.  GET HELP RIGHT AWAY IF:   You feel sick to your stomach (nauseous) or throw up (vomit).  You have watery poop (diarrhea).  You have shortness of breath or trouble breathing.  You have blood in your pee (urine) or have dark colored pee.  You have chest pain or tightness.  Your eyes or skin turn yellow (jaundice).  You have a temperature by mouth above 102 F (38.9 C), not controlled by medicine.  You start to shake and have chills.  You develop a a red rash (hives) or feel itchy.  You develop lightheadedness or feel confused.  You develop back, joint, or muscle pain.  You do not feel hungry (lost appetite).  You feel tired, restless, or nervous.  You develop belly (abdominal) cramps. Document Released: 07/12/2008 Document Revised: 07/08/2011 Document Reviewed: 07/12/2008 Exit[employer] Patient Information 2014 [employer].  

## 2013-09-16 NOTE — Telephone Encounter (Signed)
gv adn printed atp scehd and avs for pt for June

## 2013-09-16 NOTE — Patient Instructions (Signed)
Lenalidomide Oral Capsules What is this medicine? LENALIDOMIDE (len a LID oh mide) is used to treat certain types of cancer, including multiple myeloma and mantle cell lymphoma. It is also used to treat some myelodysplastic syndromes that cause severe anemia requiring blood transfusions. This medicine may be used for other purposes; ask your health care provider or pharmacist if you have questions. COMMON BRAND NAME(S): Revlimid What should I tell my health care provider before I take this medicine? They need to know if you have any of these conditions: -blood clots in the legs or the lungs -infection -irregular monthly periods or menstrual cycles -kidney disease -liver disease -an unusual or allergic reaction to lenalidomide, other medicines, foods, dyes, or preservatives -pregnant or trying to get pregnant -breast-feeding How should I use this medicine? Take this medicine by mouth with a glass of water. Follow the directions on the prescription label. Do not cut, crush, or chew this medicine. Take your medicine at regular intervals. Do not take it more often than directed. Do not stop taking except on your doctor's advice. A MedGuide will be given with each prescription and refill. Read this guide carefully each time. The MedGuide may change frequently. Talk to your pediatrician regarding the use of this medicine in children. Special care may be needed. Overdosage: If you think you have taken too much of this medicine contact a poison control center or emergency room at once. NOTE: This medicine is only for you. Do not share this medicine with others. What if I miss a dose? If you miss a dose, take it as soon as you can. If your next dose is to be taken in less than 12 hours, then do not take the missed dose. Take the next dose at your regular time. Do not take double or extra doses. What may interact with this medicine? -vaccines This list may not describe all possible interactions. Give  your health care provider a list of all the medicines, herbs, non-prescription drugs, or dietary supplements you use. Also tell them if you smoke, drink alcohol, or use illegal drugs. Some items may interact with your medicine. What should I watch for while using this medicine? Visit your doctor for regular check ups. Tell your doctor or healthcare professional if your symptoms do not start to get better or if they get worse. You will need to have important blood work done while you are taking this medicine. This medicine is available only through a special program. Doctors, pharmacies, and patients must meet all of the conditions of the program. Your health care provider will help you get signed up with the program if you need this medicine. Through the program you will only receive up to a 28 day supply of the medicine at one time. You will need a new prescription for each refill. This medicine can cause birth defects. Do not get pregnant while taking this drug. Females with child-bearing potential will need to have 2 negative pregnancy tests before starting this medicine. Pregnancy testing must be done every 2 to 4 weeks as directed while taking this medicine. Use 2 reliable forms of birth control together while you are taking this medicine and for 1 month after you stop taking this medicine. If you think that you might be pregnant talk to your doctor right away. Men must use a latex condom during sexual contact with a woman while taking this medicine and for 28 days after you stop taking this medicine. A latex condom is needed even  if you have had a vasectomy. Contact your doctor right away if your partner becomes pregnant. Do not donate sperm while taking this medicine and for 28 days after you stop taking this medicine. Do not give blood while taking the medicine and for 1 month after completion of treatment to avoid exposing pregnant women to the medicine through the donated blood. Talk to your doctor  about your risk of cancer. You may be more at risk for certain types of cancers if you take this medicine. You may need blood work done while you are taking this medicine. What side effects may I notice from receiving this medicine? Side effects that you should report to your doctor or health care professional as soon as possible: -allergic reactions like skin rash, itching or hives, swelling of the face, lips, or tongue -breathing problems -chest pain -fever, infection, runny nose, or sore throat -pain in the legs -right upper belly pain -signs and symptoms of bleeding such as bloody or black, tarry stools; red or dark-brown urine; spitting up blood or brown material that looks like coffee grounds; red spots on the skin; unusual bruising or bleeding from the eye, gums, or nose -swelling or your hands, ankles, or leg -tiredness -yellowing of the eyes or skin  Side effects that usually do not require medical attention (report to your doctor or health care professional if they continue or are bothersome): -diarrhea -dizziness -back pain This list may not describe all possible side effects. Call your doctor for medical advice about side effects. You may report side effects to FDA at 1-800-FDA-1088. Where should I keep my medicine? Keep out of the reach of children. Store at room temperature between 15 and 30 degrees C (59 and 86 degrees F). Throw away any unused medicine after the expiration date. NOTE: This sheet is a summary. It may not cover all possible information. If you have questions about this medicine, talk to your doctor, pharmacist, or health care provider.  2014, Elsevier/Gold Standard. (2012-09-01 16:37:01) Darbepoetin Alfa injection What is this medicine? DARBEPOETIN ALFA (dar be POE e tin AL fa) helps your body make more red blood cells. It is used to treat anemia caused by chronic kidney failure and chemotherapy. This medicine may be used for other purposes; ask your health  care provider or pharmacist if you have questions. COMMON BRAND NAME(S): Aranesp What should I tell my health care provider before I take this medicine? They need to know if you have any of these conditions: -blood clotting disorders or history of blood clots -cancer patient not on chemotherapy -cystic fibrosis -heart disease, such as angina, heart failure, or a history of a heart attack -hemoglobin level of 12 g/dL or greater -high blood pressure -low levels of folate, iron, or vitamin B12 -seizures -an unusual or allergic reaction to darbepoetin, erythropoietin, albumin, hamster proteins, latex, other medicines, foods, dyes, or preservatives -pregnant or trying to get pregnant -breast-feeding How should I use this medicine? This medicine is for injection into a vein or under the skin. It is usually given by a health care professional in a hospital or clinic setting. If you get this medicine at home, you will be taught how to prepare and give this medicine. Do not shake the solution before you withdraw a dose. Use exactly as directed. Take your medicine at regular intervals. Do not take your medicine more often than directed. It is important that you put your used needles and syringes in a special sharps container. Do not  put them in a trash can. If you do not have a sharps container, call your pharmacist or healthcare provider to get one. Talk to your pediatrician regarding the use of this medicine in children. While this medicine may be used in children as young as 1 year for selected conditions, precautions do apply. Overdosage: If you think you have taken too much of this medicine contact a poison control center or emergency room at once. NOTE: This medicine is only for you. Do not share this medicine with others. What if I miss a dose? If you miss a dose, take it as soon as you can. If it is almost time for your next dose, take only that dose. Do not take double or extra doses. What may  interact with this medicine? Do not take this medicine with any of the following medications: -epoetin alfa This list may not describe all possible interactions. Give your health care provider a list of all the medicines, herbs, non-prescription drugs, or dietary supplements you use. Also tell them if you smoke, drink alcohol, or use illegal drugs. Some items may interact with your medicine. What should I watch for while using this medicine? Visit your prescriber or health care professional for regular checks on your progress and for the needed blood tests and blood pressure measurements. It is especially important for the doctor to make sure your hemoglobin level is in the desired range, to limit the risk of potential side effects and to give you the best benefit. Keep all appointments for any recommended tests. Check your blood pressure as directed. Ask your doctor what your blood pressure should be and when you should contact him or her. As your body makes more red blood cells, you may need to take iron, folic acid, or vitamin B supplements. Ask your doctor or health care provider which products are right for you. If you have kidney disease continue dietary restrictions, even though this medication can make you feel better. Talk with your doctor or health care professional about the foods you eat and the vitamins that you take. What side effects may I notice from receiving this medicine? Side effects that you should report to your doctor or health care professional as soon as possible: -allergic reactions like skin rash, itching or hives, swelling of the face, lips, or tongue -breathing problems -changes in vision -chest pain -confusion, trouble speaking or understanding -feeling faint or lightheaded, falls -high blood pressure -muscle aches or pains -pain, swelling, warmth in the leg -rapid weight gain -severe headaches -sudden numbness or weakness of the face, arm or leg -trouble walking,  dizziness, loss of balance or coordination -seizures (convulsions) -swelling of the ankles, feet, hands -unusually weak or tired Side effects that usually do not require medical attention (report to your doctor or health care professional if they continue or are bothersome): -diarrhea -fever, chills (flu-like symptoms) -headaches -nausea, vomiting -redness, stinging, or swelling at site where injected This list may not describe all possible side effects. Call your doctor for medical advice about side effects. You may report side effects to FDA at 1-800-FDA-1088. Where should I keep my medicine? Keep out of the reach of children. Store in a refrigerator between 2 and 8 degrees C (36 and 46 degrees F). Do not freeze. Do not shake. Throw away any unused portion if using a single-dose vial. Throw away any unused medicine after the expiration date. NOTE: This sheet is a summary. It may not cover all possible information. If you  have questions about this medicine, talk to your doctor, pharmacist, or health care provider.  2014, Elsevier/Gold Standard. (2008-03-29 10:23:57) Azacitidine suspension for injection (subcutaneous use) What is this medicine? AZACITIDINE (ay [place]) is a chemotherapy drug. This medicine reduces the growth of cancer cells and can suppress the immune system. It is used for treating myelodysplastic syndrome or some types of leukemia. This medicine may be used for other purposes; ask your health care provider or pharmacist if you have questions. COMMON BRAND NAME(S): Vidaza What should I tell my health care provider before I take this medicine? They need to know if you have any of these conditions: -infection (especially a virus infection such as chickenpox, cold sores, or herpes) -kidney disease -liver disease -liver tumors -an unusual or allergic reaction to azacitidine, mannitol, other medicines, foods, dyes, or preservatives -pregnant or trying to get  pregnant -breast-feeding How should I use this medicine? This medicine is for injection under the skin. It is administered in a hospital or clinic by a specially trained health care professional. Talk to your pediatrician regarding the use of this medicine in children. While this drug may be prescribed for selected conditions, precautions do apply. Overdosage: If you think you have taken too much of this medicine contact a poison control center or emergency room at once. NOTE: This medicine is only for you. Do not share this medicine with others. What if I miss a dose? It is important not to miss your dose. Call your doctor or health care professional if you are unable to keep an appointment. What may interact with this medicine? -vaccines Talk to your doctor or health care professional before taking any of these medicines: -acetaminophen -aspirin -ibuprofen -ketoprofen -naproxen This list may not describe all possible interactions. Give your health care provider a list of all the medicines, herbs, non-prescription drugs, or dietary supplements you use. Also tell them if you smoke, drink alcohol, or use illegal drugs. Some items may interact with your medicine. What should I watch for while using this medicine? Visit your doctor for checks on your progress. This drug may make you feel generally unwell. This is not uncommon, as chemotherapy can affect healthy cells as well as cancer cells. Report any side effects. Continue your course of treatment even though you feel ill unless your doctor tells you to stop. In some cases, you may be given additional medicines to help with side effects. Follow all directions for their use. Call your doctor or health care professional for advice if you get a fever, chills or sore throat, or other symptoms of a cold or flu. Do not treat yourself. This drug decreases your body's ability to fight infections. Try to avoid being around people who are sick. This  medicine may increase your risk to bruise or bleed. Call your doctor or health care professional if you notice any unusual bleeding. Be careful brushing and flossing your teeth or using a toothpick because you may get an infection or bleed more easily. If you have any dental work done, tell your dentist you are receiving this medicine. Avoid taking products that contain aspirin, acetaminophen, ibuprofen, naproxen, or ketoprofen unless instructed by your doctor. These medicines may hide a fever. Do not have any vaccinations without your doctor's approval and avoid anyone who has recently had oral polio vaccine. Do not become pregnant while taking this medicine. Women should inform their doctor if they wish to become pregnant or think they might be pregnant. There is  a potential for serious side effects to an unborn child. Talk to your health care professional or pharmacist for more information. Do not breast-feed an infant while taking this medicine. If you are a man, you should not father a child while receiving treatment. What side effects may I notice from receiving this medicine? Side effects that you should report to your doctor or health care professional as soon as possible: -allergic reactions like skin rash, itching or hives, swelling of the face, lips, or tongue -low blood counts - this medicine may decrease the number of white blood cells, red blood cells and platelets. You may be at increased risk for infections and bleeding. -signs of infection - fever or chills, cough, sore throat, pain or difficulty passing urine -signs of decreased platelets or bleeding - bruising, pinpoint red spots on the skin, black, tarry stools, blood in the urine -signs of decreased red blood cells - unusually weak or tired, fainting spells, lightheadedness -reactions at the injection site including redness, pain, itching, or bruising -breathing problems -changes in vision -fever -mouth sores -stomach  pain -vomiting Side effects that usually do not require medical attention (report to your doctor or health care professional if they continue or are bothersome): -constipation -diarrhea -loss of appetite -nausea -pain or redness at the injection site -weak or tired This list may not describe all possible side effects. Call your doctor for medical advice about side effects. You may report side effects to FDA at 1-800-FDA-1088. Where should I keep my medicine? This drug is given in a hospital or clinic and will not be stored at home. NOTE: This sheet is a summary. It may not cover all possible information. If you have questions about this medicine, talk to your doctor, pharmacist, or health care provider.  2014, Elsevier/Gold Standard. (2007-07-09 11:04:07)

## 2013-09-17 LAB — TYPE AND SCREEN
ABO/RH(D): AB NEG
ANTIBODY SCREEN: NEGATIVE
Donor AG Type: NEGATIVE
Donor AG Type: NEGATIVE
UNIT DIVISION: 0
Unit division: 0

## 2013-09-21 ENCOUNTER — Other Ambulatory Visit: Payer: Self-pay | Admitting: Hematology and Oncology

## 2013-09-21 ENCOUNTER — Telehealth: Payer: Self-pay | Admitting: Hematology and Oncology

## 2013-09-21 ENCOUNTER — Telehealth: Payer: Self-pay | Admitting: *Deleted

## 2013-09-21 LAB — VITAMIN B12

## 2013-09-21 LAB — FOLATE RBC: RBC Folate: 382 ng/mL (ref >280)

## 2013-09-21 LAB — ERYTHROPOIETIN: Erythropoietin: 393.4 m[IU]/mL — ABNORMAL HIGH (ref 2.6–18.5)

## 2013-09-21 NOTE — Telephone Encounter (Signed)
I reviewed the test result with her daughter. Serum erythropoietin is less than 500. She may be a candidate for Aranesp. We discussed about the role of chemotherapy and bone marrow biopsy as well as prognosis without systemic chemotherapy. Her daughter wants to wait until followup appointment next week to decide on ultimate plan of care. She appreciates the phone call. I will schedule potential injection at the same time of her return visit.

## 2013-09-21 NOTE — Telephone Encounter (Signed)
Informed daughter of lab results not back yet.  Should be back by end of week.  Daughter verbalized understanding and will either call us back at end of week or will wait until pt's appt w/ Dr. Alvy Bimler next week.

## 2013-09-21 NOTE — Telephone Encounter (Signed)
Message copied by Cathlean Cower on Tue Sep 21, 2013  9:45 AM ------      Message from: [facility], [place]      Created: Tue Sep 21, 2013  9:14 AM      Regarding: RE: serum EPO level       OK, please let her daughter know      ----- Message -----         From: Cathlean Cower, RN         Sent: 09/21/2013   9:02 AM           To: [name], MD      Subject: RE: serum EPO level                                      Lab says it was sent but takes about one week to come back.       ----- Message -----         From: [name], MD         Sent: 09/20/2013   8:51 PM           To: Cathlean Cower, RN      Subject: serum EPO level                                          Was it drawn? Daughter is awaiting for this result      Please check with lab             ------

## 2013-09-26 ENCOUNTER — Other Ambulatory Visit: Payer: Self-pay | Admitting: Hematology and Oncology

## 2013-09-29 ENCOUNTER — Ambulatory Visit (HOSPITAL_BASED_OUTPATIENT_CLINIC_OR_DEPARTMENT_OTHER): Payer: Medicare Other | Admitting: Hematology and Oncology

## 2013-09-29 ENCOUNTER — Encounter: Payer: Self-pay | Admitting: Hematology and Oncology

## 2013-09-29 ENCOUNTER — Telehealth: Payer: Self-pay | Admitting: Hematology and Oncology

## 2013-09-29 ENCOUNTER — Encounter: Payer: Self-pay | Admitting: Family Medicine

## 2013-09-29 ENCOUNTER — Ambulatory Visit (INDEPENDENT_AMBULATORY_CARE_PROVIDER_SITE_OTHER): Payer: Medicare Other | Admitting: Family Medicine

## 2013-09-29 ENCOUNTER — Other Ambulatory Visit (HOSPITAL_BASED_OUTPATIENT_CLINIC_OR_DEPARTMENT_OTHER): Payer: Medicare Other

## 2013-09-29 ENCOUNTER — Telehealth: Payer: Self-pay | Admitting: Family Medicine

## 2013-09-29 VITALS — BP 146/46 | HR 78 | Temp 97.6°F | Resp 18 | Ht 66.0 in | Wt 103.3 lb

## 2013-09-29 VITALS — BP 140/70 | HR 74 | Wt 104.0 lb

## 2013-09-29 DIAGNOSIS — D469 Myelodysplastic syndrome, unspecified: Secondary | ICD-10-CM

## 2013-09-29 DIAGNOSIS — E1165 Type 2 diabetes mellitus with hyperglycemia: Secondary | ICD-10-CM

## 2013-09-29 DIAGNOSIS — IMO0002 Reserved for concepts with insufficient information to code with codable children: Secondary | ICD-10-CM

## 2013-09-29 DIAGNOSIS — IMO0001 Reserved for inherently not codable concepts without codable children: Secondary | ICD-10-CM

## 2013-09-29 DIAGNOSIS — R252 Cramp and spasm: Secondary | ICD-10-CM

## 2013-09-29 DIAGNOSIS — I10 Essential (primary) hypertension: Secondary | ICD-10-CM

## 2013-09-29 LAB — CBC & DIFF AND RETIC
BASO%: 0.9 % (ref 0.0–2.0)
BASOS ABS: 0 10*3/uL (ref 0.0–0.1)
EOS%: 0.3 % (ref 0.0–7.0)
Eosinophils Absolute: 0 10*3/uL (ref 0.0–0.5)
HCT: 31 % — ABNORMAL LOW (ref 34.8–46.6)
HEMOGLOBIN: 10.1 g/dL — AB (ref 11.6–15.9)
Immature Retic Fract: 10.8 % — ABNORMAL HIGH (ref 1.60–10.00)
LYMPH%: 33.7 % (ref 14.0–49.7)
MCH: 28.7 pg (ref 25.1–34.0)
MCHC: 32.6 g/dL (ref 31.5–36.0)
MCV: 88.1 fL (ref 79.5–101.0)
MONO#: 0 10*3/uL — ABNORMAL LOW (ref 0.1–0.9)
MONO%: 0.3 % (ref 0.0–14.0)
NEUT#: 2.2 10*3/uL (ref 1.5–6.5)
NEUT%: 64.8 % (ref 38.4–76.8)
Platelets: 141 10*3/uL — ABNORMAL LOW (ref 145–400)
RBC: 3.52 10*6/uL — ABNORMAL LOW (ref 3.70–5.45)
RDW: 15.5 % — ABNORMAL HIGH (ref 11.2–14.5)
Retic %: <0.38 % — ABNORMAL LOW (ref 0.5–1.5)
Retic Ct Abs: 7.39 10*3/uL — ABNORMAL LOW (ref 33.70–90.70)
WBC: 3.4 10*3/uL — ABNORMAL LOW (ref 3.9–10.3)
lymph#: 1.1 10*3/uL (ref 0.9–3.3)
nRBC: 0 % (ref 0–0)

## 2013-09-29 LAB — COMPREHENSIVE METABOLIC PANEL (CC13)
ALT: 17 U/L (ref 0–55)
AST: 19 U/L (ref 5–34)
Albumin: 3.5 g/dL (ref 3.5–5.0)
Alkaline Phosphatase: 84 U/L (ref 40–150)
Anion Gap: 14 mEq/L — ABNORMAL HIGH (ref 3–11)
BUN: 26.6 mg/dL — ABNORMAL HIGH (ref 7.0–26.0)
CO2: 24 mEq/L (ref 22–29)
Calcium: 9.7 mg/dL (ref 8.4–10.4)
Chloride: 104 mEq/L (ref 98–109)
Creatinine: 1.4 mg/dL — ABNORMAL HIGH (ref 0.6–1.1)
Glucose: 172 mg/dl — ABNORMAL HIGH (ref 70–140)
Potassium: 4.2 mEq/L (ref 3.5–5.1)
Sodium: 141 mEq/L (ref 136–145)
Total Bilirubin: 0.62 mg/dL (ref 0.20–1.20)
Total Protein: 7.3 g/dL (ref 6.4–8.3)

## 2013-09-29 LAB — HOLD TUBE, BLOOD BANK

## 2013-09-29 LAB — TECHNOLOGIST REVIEW

## 2013-09-29 NOTE — Assessment & Plan Note (Signed)
I had a long discussion with the patient, her daughter and her son. We discussed four options today. Chemotherapeutic options would require repeating a bone marrow biopsy. 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 I also discussed with her the risk and benefit of azacitidine. The patient is not keen to start chemotherapy at all. In terms of supportive care, I recommend transfusion only or a trial of erythropoietin stimulating agents with darbepoetin. I discussed with her the risk and benefits of each options and ultimately the patient is willing to try darbepoetin.  I recommend we start with 300 mcg every 2 weeks for hemoglobin less than 10 g. I also recommend lowering her blood transfusion threshold to 7 g and only transfuse 1 unit of blood each time. The patient wants to get away from premedication if possible because Benadryl will make her sleepy.

## 2013-09-29 NOTE — Progress Notes (Signed)
Pre visit review using our clinic review tool, if applicable. No additional management support is needed unless otherwise documented below in the visit note. 

## 2013-09-29 NOTE — Patient Instructions (Signed)
Leg Cramps  Leg cramps that occur during exercise can be caused by poor circulation or dehydration. However, muscle cramps that occur at rest or during the night are usually not due to any serious medical problem. Heat cramps may cause muscle spasms during hot weather.   CAUSES  There is no clear cause for muscle cramps. However, dehydration may be a factor for those who do not drink enough fluids and those who exercise in the heat. Imbalances in the level of sodium, potassium, calcium or magnesium in the muscle tissue may also be a factor. Some medications, such as water pills (diuretics), may cause loss of chemicals that the body needs (like sodium and potassium) and cause muscle cramps.  TREATMENT   · Make sure your diet has enough fluids and essential minerals for the muscle to work normally.  · Avoid strenuous exercise for several days if you have been having frequent leg cramps.  · Stretch and massage the cramped muscle for several minutes.  · Some medicines may be helpful in some patients with night cramps. Only take over-the-counter or prescription medicines as directed by your caregiver.  SEEK IMMEDIATE MEDICAL CARE IF:   · Your leg cramps become worse.  · Your foot becomes cold, numb, or blue.  Document Released: 05/23/2004 Document Revised: 07/08/2011 Document Reviewed: 05/10/2008  [employer]® Patient Information ©2014 [employer].

## 2013-09-29 NOTE — Telephone Encounter (Signed)
gv pt/relative appt schedule for june/july

## 2013-09-29 NOTE — Assessment & Plan Note (Signed)
I do not recommend her to continue taking Exjade for iron overload as it is costly, with risk of renal and liver toxicity and it does not bring survival benefit. She agreed.

## 2013-09-29 NOTE — Telephone Encounter (Signed)
Relevant patient education mailed to patient.  

## 2013-09-29 NOTE — Progress Notes (Signed)
[place] OFFICE PROGRESS NOTE  Patient Care Team: [name], MD as PCP - General  SUMMARY OF ONCOLOGIC HISTORY:  HISTORY OF PRESENTING ILLNESS:  Meredith Rodriguez 80 y.o. female was transferred to my care after her prior physician has left.  I reviewed the patient's records extensive and collaborated the history with the patient & her daughter. The patient is a very poor historian.  Summary of her history is as follows: #30 March 2011, she was noted to have anemia with associated thrombocytosis. She became transfusion dependent and was referred to see a hematologist here for further evaluation. The patient has lack of reticulocytosis with associated elevated erythropoietin level of 197. #2 on 05/28/2011, bone marrow aspirate and biopsy was hypercellular with increased myelofibrosis. There were no evidence of increased blasts. #3 The patient was started on erythropoietin stimulating agent with Procrit injections 40,000 units every 2 weeks beginning of 11/20/2011 along with transfusion support. By October 2013, Procrit was deemed ineffective. She was subsequently recommended to consider chemotherapy. #4 On 02/19/2012, repeat bone marrow aspirate and biopsy show persistent myelofibrosis and hypercellular marrow. It was difficult to estimate percentage of blastic cells to do sampling error. By flow cytometry, 10% of abnormal cells were detected. #5 From 02/24/2012 to 05/08/2012, she received 3 cycles of decitabine. The last cycle of treatment was complicated by febrile neutropenia and pneumonia requiring hospitalization. Decision was made to discontinue chemotherapy and to return to clinic for transfusion support only. #6 Due to severe iron overload, she was started on Exjade around September 2014 to May 2015.  INTERVAL HISTORY: Please see below for problem oriented charting. She complained of feeling fatigued. She denies any recent fever, chills, night sweats or abnormal weight  loss   REVIEW OF SYSTEMS:   Constitutional: Denies fevers, chills or abnormal weight loss Eyes: Denies blurriness of vision Ears, nose, mouth, throat, and face: Denies mucositis or sore throat Respiratory: Denies cough, dyspnea or wheezes Cardiovascular: Denies palpitation, chest discomfort or lower extremity swelling Gastrointestinal:  Denies nausea, heartburn or change in bowel habits Skin: Denies abnormal skin rashes Lymphatics: Denies new lymphadenopathy or easy bruising Neurological:Denies numbness, tingling or new weaknesses Behavioral/Psych: Mood is stable, no new changes  All other systems were reviewed with the patient and are negative.  I have reviewed the past medical history, past surgical history, social history and family history with the patient and they are unchanged from previous note.  ALLERGIES:  is allergic to amoxicillin; cephalexin; metformin and related; phenergan; and procardia.  MEDICATIONS:  Current Outpatient Prescriptions  Medication Sig Dispense Refill  . amLODipine (NORVASC) 10 MG tablet Take 1 tablet (10 mg total) by mouth daily.  90 tablet  3  . aspirin 81 MG tablet Take 81 mg by mouth daily.      . B-D UF III MINI PEN NEEDLES 31G X 5 MM MISC USE WITH LANTUS PEN DAILY AS INSTRUCTED  100 each  2  . cholecalciferol (VITAMIN D) 1000 UNITS tablet Take 1,000 Units by mouth daily.      Marland Kitchen FREESTYLE LITE test strip USE TO TEST BLOOD SUGAR 4 TIMES DAILY AS DIRECTED  30 each  10  . insulin lispro (HUMALOG KWIKPEN) 100 UNIT/ML SOPN Inject 4 Units into the skin 2 (two) times daily with a meal. Inject 4 units twice daily into skin if blood glucose is over 100 at lunch and dinner      . Lancets (FREESTYLE) lancets 1 each by Other route 2 (two) times  daily.       Marland Kitchen LANTUS SOLOSTAR 100 UNIT/ML SOPN INJECT 10 UNITS INTO THE SKIN DAILY  15 mL  3  . lidocaine-prilocaine (EMLA) cream Apply topically as needed.  30 g  4  . metoprolol (LOPRESSOR) 50 MG tablet Take 1 tablet  (50 mg total) by mouth 2 (two) times daily.  180 tablet  3  . Omega-3 Fatty Acids (FISH OIL) 1000 MG CAPS Take 1,000 mg by mouth daily.       . simvastatin (ZOCOR) 5 MG tablet Take 5 mg by mouth at bedtime.      . [DISCONTINUED] glimepiride (AMARYL) 2 MG tablet Take 1 tablet (2 mg total) by mouth every morning.  30 tablet  11   Current Facility-Administered Medications  Medication Dose Route Frequency Provider Last Rate Last Dose  . TDaP (BOOSTRIX) injection 0.5 mL  0.5 mL Intramuscular Once [name], MD        PHYSICAL EXAMINATION: ECOG PERFORMANCE STATUS: 1 - Symptomatic but completely ambulatory  Filed Vitals:   09/29/13 0845  BP: 146/46  Pulse: 78  Temp: 97.6 F (36.4 C)  Resp: 18   Filed Weights   09/29/13 0845  Weight: 103 lb 4.8 oz (46.857 kg)    GENERAL:alert, no distress and comfortable. She looks thin but not cachectic SKIN: skin color, texture, turgor are normal, no rashes or significant lesions EYES: normal, Conjunctiva are pink and non-injected, sclera clear Musculoskeletal:no cyanosis of digits and no clubbing  NEURO: alert & oriented x 3 with fluent speech, no focal motor/sensory deficits  LABORATORY DATA:  I have reviewed the data as listed    Component Value Date/Time   NA 141 09/29/2013 0830   NA 135 02/10/2013 0620   K 4.2 09/29/2013 0830   K 4.1 02/10/2013 0620   CL 101 02/10/2013 0620   CL 99 06/01/2012 1028   CO2 24 09/29/2013 0830   CO2 27 02/10/2013 0620   GLUCOSE 172* 09/29/2013 0830   GLUCOSE 196* 02/10/2013 0620   GLUCOSE 230* 06/01/2012 1028   BUN 26.6* 09/29/2013 0830   BUN 15 02/10/2013 0620   CREATININE 1.4* 09/29/2013 0830   CREATININE 0.62 02/10/2013 0620   CREATININE 0.69 07/18/2010 1202   CALCIUM 9.7 09/29/2013 0830   CALCIUM 8.4 02/10/2013 0620   PROT 7.3 09/29/2013 0830   PROT 5.7* 02/11/2013 1611   ALBUMIN 3.5 09/29/2013 0830   ALBUMIN 3.2* 02/07/2013 2047   AST 19 09/29/2013 0830   AST 33 02/07/2013 2047   ALT 17 09/29/2013 0830   ALT  34 02/07/2013 2047   ALKPHOS 84 09/29/2013 0830   ALKPHOS 55 02/07/2013 2047   BILITOT 0.62 09/29/2013 0830   BILITOT 0.9 02/07/2013 2047   GFRNONAA 80* 02/10/2013 0620   GFRAA >90 02/10/2013 0620    No results found for this basename: SPEP,  UPEP,   kappa and lambda light chains    Lab Results  Component Value Date   WBC 3.4* 09/29/2013   NEUTROABS 2.2 09/29/2013   HGB 10.1* 09/29/2013   HCT 31.0* 09/29/2013   MCV 88.1 09/29/2013   PLT 141* 09/29/2013      Chemistry      Component Value Date/Time   NA 141 09/29/2013 0830   NA 135 02/10/2013 0620   K 4.2 09/29/2013 0830   K 4.1 02/10/2013 0620   CL 101 02/10/2013 0620   CL 99 06/01/2012 1028   CO2 24 09/29/2013 0830   CO2 27 02/10/2013 2440  BUN 26.6* 09/29/2013 0830   BUN 15 02/10/2013 0620   CREATININE 1.4* 09/29/2013 0830   CREATININE 0.62 02/10/2013 0620   CREATININE 0.69 07/18/2010 1202   GLU 215* 02/26/2012 1255      Component Value Date/Time   CALCIUM 9.7 09/29/2013 0830   CALCIUM 8.4 02/10/2013 0620   ALKPHOS 84 09/29/2013 0830   ALKPHOS 55 02/07/2013 2047   AST 19 09/29/2013 0830   AST 33 02/07/2013 2047   ALT 17 09/29/2013 0830   ALT 34 02/07/2013 2047   BILITOT 0.62 09/29/2013 0830   BILITOT 0.9 02/07/2013 2047     ASSESSMENT & PLAN:  Myelodysplastic syndrome I had a long discussion with the patient, her daughter and her son. We discussed four options today. Chemotherapeutic options would require repeating a bone marrow biopsy. 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 I also discussed with her the risk and benefit of azacitidine. The patient is not keen to start chemotherapy at all. In terms of supportive care, I recommend transfusion only or a trial of erythropoietin stimulating agents with darbepoetin. I discussed with her the risk and benefits of each options and ultimately the patient is willing to try darbepoetin.  I recommend we start with 300 mcg every 2 weeks for  hemoglobin less than 10 g. I also recommend lowering her blood transfusion threshold to 7 g and only transfuse 1 unit of blood each time. The patient wants to get away from premedication if possible because Benadryl will make her sleepy.   Iron overload due to repeated red blood cell transfusions I do not recommend her to continue taking Exjade for iron overload as it is costly, with risk of renal and liver toxicity and it does not bring survival benefit. She agreed.    Orders Placed This Encounter  Procedures  . CBC with Differential    Standing Status: Standing     Number of Occurrences: 22     Standing Expiration Date: 09/30/2014  . Hold Tube, Blood Bank    Standing Status: Standing     Number of Occurrences: 22     Standing Expiration Date: 09/30/2014   All questions were answered. The patient knows to call the clinic with any problems, questions or concerns. No barriers to learning was detected. I spent 40 minutes counseling the patient face to face. The total time spent in the appointment was 55 minutes and more than 50% was on counseling and review of test results     [name], MD 09/29/2013 8:52 PM

## 2013-09-29 NOTE — Progress Notes (Signed)
Subjective:    Patient ID: Meredith Rodriguez, female    DOB: 09/14/1925, 80 y.o.   MRN: [id number]  HPI This is a for medical followup. She has history of type 2 diabetes, hypertension, dyslipidemia, mild dysplastic syndrome with chronic anemia. She is followed closely by hematology. Her type 2 diabetes been poorly controlled. Last A1c 8.3%. They bring in a log of blood sugar readings today which is reviewed. She's had consistent readings in the low 100s or less than 100 fasting. Her pre-and postmeal sugars vary considerably but she is generally responding very well to low-dose Humalog 4 units at supper and lunch. No hypoglycemic episodes. Most of her postprandial blood sugars 2 hours postprandial or less than 150. No symptoms of polyuria or thirst.  Bilateral leg cramps at night. Recent magnesium level normal. Recent electrolytes normal. No claudication symptoms.  Hypertension which is been stable with amlodipine and metoprolol. No dizziness.  Diabetic neuropathy. Did not tolerate gabapentin secondary to dizziness. She's had previous intolerance with Lyrica  Past Medical History  Diagnosis Date  . Hypertension   . Diabetes mellitus   . Hyperkalemia   . IBS (irritable bowel syndrome)   . Hypercholesterolemia   . Cervical radiculopathy   . Anemia   . Cancer     leukemia  . Leukemia    Past Surgical History  Procedure Laterality Date  . Varicose veins      surgey 1959  . Tonsilectomy, adenoidectomy, bilateral myringotomy and tubes    . Esophagogastroduodenoscopy  04/01/2011    Procedure: ESOPHAGOGASTRODUODENOSCOPY (EGD);  Surgeon: Missy Sabins, MD;  Location: [facility] ENDOSCOPY;  Service: Endoscopy;  Laterality: N/A;    reports that she has never smoked. She has never used smokeless tobacco. She reports that she does not drink alcohol or use illicit drugs. family history includes Cancer in her daughter, sister, sister, and sister; Diabetes in her sister; Hypertension in her father; Stroke in her  mother. Allergies  Allergen Reactions  . Amoxicillin [Amoxicillin]     Upset stomach  . Cephalexin Nausea Only  . Metformin And Related     Gi problems  . Phenergan [Promethazine Hcl] Other (See Comments)    Unknown   . Procardia [Nifedipine]     dizziness      Review of Systems  Constitutional: Positive for fatigue.  Eyes: Negative for visual disturbance.  Respiratory: Negative for cough, chest tightness, shortness of breath and wheezing.   Cardiovascular: Negative for chest pain, palpitations and leg swelling.  Neurological: Negative for dizziness, seizures, syncope, weakness, light-headedness and headaches.  Psychiatric/Behavioral: Negative for confusion.       Objective:   Physical Exam  Constitutional: She is oriented to person, place, and time. She appears well-developed and well-nourished.  Neck: Neck supple. No thyromegaly present.  Cardiovascular: Normal rate and regular rhythm.   Pulmonary/Chest: Effort normal and breath sounds normal. No respiratory distress. She has no wheezes. She has no rales.  Musculoskeletal: She exhibits no edema.  Neurological: She is alert and oriented to person, place, and time.  Psychiatric: She has a normal mood and affect. Her behavior is normal.          Assessment & Plan:  #1 type 2 diabetes. History of poor control. After review of her blood sugar readings, we have decided not to make any changes at this point. She is currently taking low-dose Lantus 10 units once daily and low-dose Humalog with lunch and supper.  Given her age and comorbidities aiming for  A1c less than 8. Recheck A1c in 3 months #2 hypertension which is adequately controlled #3 bilateral leg cramps. Increase hydration. Recent electrolytes normal.

## 2013-09-30 ENCOUNTER — Other Ambulatory Visit: Payer: Medicare Other

## 2013-10-01 ENCOUNTER — Encounter: Payer: Self-pay | Admitting: Family Medicine

## 2013-10-07 ENCOUNTER — Other Ambulatory Visit: Payer: Medicare Other

## 2013-10-07 ENCOUNTER — Ambulatory Visit: Payer: Medicare Other | Admitting: Adult Health

## 2013-10-10 ENCOUNTER — Other Ambulatory Visit: Payer: Self-pay | Admitting: Family Medicine

## 2013-10-13 ENCOUNTER — Other Ambulatory Visit: Payer: Self-pay | Admitting: Hematology and Oncology

## 2013-10-13 ENCOUNTER — Ambulatory Visit (HOSPITAL_BASED_OUTPATIENT_CLINIC_OR_DEPARTMENT_OTHER): Payer: Medicare Other

## 2013-10-13 ENCOUNTER — Other Ambulatory Visit (HOSPITAL_BASED_OUTPATIENT_CLINIC_OR_DEPARTMENT_OTHER): Payer: Medicare Other

## 2013-10-13 ENCOUNTER — Ambulatory Visit (HOSPITAL_COMMUNITY)
Admission: RE | Admit: 2013-10-13 | Discharge: 2013-10-13 | Disposition: A | Discharge status: Home / Self Care | Payer: Medicare Other | Source: Ambulatory Visit | Attending: Oncology | Admitting: Oncology

## 2013-10-13 VITALS — BP 143/42 | HR 68 | Temp 97.7°F | Resp 20

## 2013-10-13 VITALS — BP 146/50 | HR 50 | Temp 97.9°F

## 2013-10-13 DIAGNOSIS — D469 Myelodysplastic syndrome, unspecified: Secondary | ICD-10-CM

## 2013-10-13 DIAGNOSIS — D630 Anemia in neoplastic disease: Secondary | ICD-10-CM

## 2013-10-13 DIAGNOSIS — D649 Anemia, unspecified: Secondary | ICD-10-CM

## 2013-10-13 LAB — CBC WITH DIFFERENTIAL/PLATELET
BASO%: 1 % (ref 0.0–2.0)
Basophils Absolute: 0 10*3/uL (ref 0.0–0.1)
EOS%: 0.3 % (ref 0.0–7.0)
Eosinophils Absolute: 0 10*3/uL (ref 0.0–0.5)
HEMATOCRIT: 21.5 % — AB (ref 34.8–46.6)
HGB: 7 g/dL — ABNORMAL LOW (ref 11.6–15.9)
LYMPH#: 0.8 10*3/uL — AB (ref 0.9–3.3)
LYMPH%: 26.4 % (ref 14.0–49.7)
MCH: 28.7 pg (ref 25.1–34.0)
MCHC: 32.6 g/dL (ref 31.5–36.0)
MCV: 88.1 fL (ref 79.5–101.0)
MONO#: 0.2 10*3/uL (ref 0.1–0.9)
MONO%: 7.2 % (ref 0.0–14.0)
NEUT#: 2 10*3/uL (ref 1.5–6.5)
NEUT%: 65.1 % (ref 38.4–76.8)
Platelets: 156 10*3/uL (ref 145–400)
RBC: 2.44 10*6/uL — ABNORMAL LOW (ref 3.70–5.45)
RDW: 14.7 % — ABNORMAL HIGH (ref 11.2–14.5)
WBC: 3.1 10*3/uL — ABNORMAL LOW (ref 3.9–10.3)
nRBC: 1 % — ABNORMAL HIGH (ref 0–0)

## 2013-10-13 LAB — TECHNOLOGIST REVIEW

## 2013-10-13 LAB — HOLD TUBE, BLOOD BANK

## 2013-10-13 LAB — PREPARE RBC (CROSSMATCH)

## 2013-10-13 MED ORDER — SODIUM CHLORIDE 0.9 % IV SOLN
250.0000 mL | Freq: Once | INTRAVENOUS | Status: AC
  Administered 2013-10-13: 250 mL via INTRAVENOUS

## 2013-10-13 MED ORDER — SODIUM CHLORIDE 0.9 % IJ SOLN
10.0000 mL | INTRAMUSCULAR | Status: AC | PRN
  Administered 2013-10-13: 10 mL
  Filled 2013-10-13: qty 10

## 2013-10-13 MED ORDER — DARBEPOETIN ALFA-POLYSORBATE 300 MCG/0.6ML IJ SOLN
300.0000 ug | Freq: Once | INTRAMUSCULAR | Status: AC
  Administered 2013-10-13: 300 ug via SUBCUTANEOUS
  Filled 2013-10-13: qty 0.6

## 2013-10-13 MED ORDER — HEPARIN SOD (PORK) LOCK FLUSH 100 UNIT/ML IV SOLN
500.0000 [IU] | Freq: Every day | INTRAVENOUS | Status: AC | PRN
  Administered 2013-10-13: 500 [IU]
  Filled 2013-10-13: qty 5

## 2013-10-13 NOTE — Patient Instructions (Signed)
Darbepoetin Alfa injection What is this medicine? DARBEPOETIN ALFA (dar be POE e tin AL fa) helps your body make more red blood cells. It is used to treat anemia caused by chronic kidney failure and chemotherapy. This medicine may be used for other purposes; ask your health care Atalya Dano or pharmacist if you have questions. COMMON BRAND NAME(S): Aranesp What should I tell my health care Ohanna Gassert before I take this medicine? They need to know if you have any of these conditions: -blood clotting disorders or history of blood clots -cancer patient not on chemotherapy -cystic fibrosis -heart disease, such as angina, heart failure, or a history of a heart attack -hemoglobin level of 12 g/dL or greater -high blood pressure -low levels of folate, iron, or vitamin B12 -seizures -an unusual or allergic reaction to darbepoetin, erythropoietin, albumin, hamster proteins, latex, other medicines, foods, dyes, or preservatives -pregnant or trying to get pregnant -breast-feeding How should I use this medicine? This medicine is for injection into a vein or under the skin. It is usually given by a health care professional in a hospital or clinic setting. If you get this medicine at home, you will be taught how to prepare and give this medicine. Do not shake the solution before you withdraw a dose. Use exactly as directed. Take your medicine at regular intervals. Do not take your medicine more often than directed. It is important that you put your used needles and syringes in a special sharps container. Do not put them in a trash can. If you do not have a sharps container, call your pharmacist or healthcare Mihailo Sage to get one. Talk to your pediatrician regarding the use of this medicine in children. While this medicine may be used in children as young as 1 year for selected conditions, precautions do apply. Overdosage: If you think you have taken too much of this medicine contact a poison control center or  emergency room at once. NOTE: This medicine is only for you. Do not share this medicine with others. What if I miss a dose? If you miss a dose, take it as soon as you can. If it is almost time for your next dose, take only that dose. Do not take double or extra doses. What may interact with this medicine? Do not take this medicine with any of the following medications: -epoetin alfa This list may not describe all possible interactions. Give your health care Deepika Decatur a list of all the medicines, herbs, non-prescription drugs, or dietary supplements you use. Also tell them if you smoke, drink alcohol, or use illegal drugs. Some items may interact with your medicine. What should I watch for while using this medicine? Visit your prescriber or health care professional for regular checks on your progress and for the needed blood tests and blood pressure measurements. It is especially important for the doctor to make sure your hemoglobin level is in the desired range, to limit the risk of potential side effects and to give you the best benefit. Keep all appointments for any recommended tests. Check your blood pressure as directed. Ask your doctor what your blood pressure should be and when you should contact him or her. As your body makes more red blood cells, you may need to take iron, folic acid, or vitamin B supplements. Ask your doctor or health care Richardo Popoff which products are right for you. If you have kidney disease continue dietary restrictions, even though this medication can make you feel better. Talk with your doctor or health   care professional about the foods you eat and the vitamins that you take. What side effects may I notice from receiving this medicine? Side effects that you should report to your doctor or health care professional as soon as possible: -allergic reactions like skin rash, itching or hives, swelling of the face, lips, or tongue -breathing problems -changes in vision -chest  pain -confusion, trouble speaking or understanding -feeling faint or lightheaded, falls -high blood pressure -muscle aches or pains -pain, swelling, warmth in the leg -rapid weight gain -severe headaches -sudden numbness or weakness of the face, arm or leg -trouble walking, dizziness, loss of balance or coordination -seizures (convulsions) -swelling of the ankles, feet, hands -unusually weak or tired Side effects that usually do not require medical attention (report to your doctor or health care professional if they continue or are bothersome): -diarrhea -fever, chills (flu-like symptoms) -headaches -nausea, vomiting -redness, stinging, or swelling at site where injected This list may not describe all possible side effects. Call your doctor for medical advice about side effects. You may report side effects to FDA at 1-800-FDA-1088. Where should I keep my medicine? Keep out of the reach of children. Store in a refrigerator between 2 and 8 degrees C (36 and 46 degrees F). Do not freeze. Do not shake. Throw away any unused portion if using a single-dose vial. Throw away any unused medicine after the expiration date. NOTE: This sheet is a summary. It may not cover all possible information. If you have questions about this medicine, talk to your doctor, pharmacist, or health care Anjolaoluwa Siguenza.  2014, Elsevier/Gold Standard. (2008-03-29 10:23:57)  

## 2013-10-13 NOTE — Patient Instructions (Signed)

## 2013-10-14 LAB — TYPE AND SCREEN
ABO/RH(D): AB NEG
Antibody Screen: NEGATIVE
DONOR AG TYPE: NEGATIVE
Unit division: 0

## 2013-10-27 ENCOUNTER — Ambulatory Visit (HOSPITAL_BASED_OUTPATIENT_CLINIC_OR_DEPARTMENT_OTHER): Payer: Medicare Other

## 2013-10-27 ENCOUNTER — Other Ambulatory Visit (HOSPITAL_BASED_OUTPATIENT_CLINIC_OR_DEPARTMENT_OTHER): Payer: Medicare Other

## 2013-10-27 ENCOUNTER — Ambulatory Visit (HOSPITAL_COMMUNITY)
Admission: RE | Admit: 2013-10-27 | Discharge: 2013-10-27 | Disposition: A | Discharge status: Home / Self Care | Payer: Medicare Other | Source: Ambulatory Visit | Attending: Oncology | Admitting: Oncology

## 2013-10-27 VITALS — BP 135/40 | HR 71 | Temp 98.3°F | Resp 20

## 2013-10-27 VITALS — BP 146/37 | HR 73 | Temp 97.3°F

## 2013-10-27 DIAGNOSIS — D469 Myelodysplastic syndrome, unspecified: Secondary | ICD-10-CM

## 2013-10-27 DIAGNOSIS — D630 Anemia in neoplastic disease: Secondary | ICD-10-CM

## 2013-10-27 LAB — CBC WITH DIFFERENTIAL/PLATELET
BASO%: 0 % (ref 0.0–2.0)
Basophils Absolute: 0 10*3/uL (ref 0.0–0.1)
EOS%: 0.2 % (ref 0.0–7.0)
Eosinophils Absolute: 0 10*3/uL (ref 0.0–0.5)
HCT: 20.9 % — ABNORMAL LOW (ref 34.8–46.6)
HGB: 6.9 g/dL — CL (ref 11.6–15.9)
LYMPH%: 26.7 % (ref 14.0–49.7)
MCH: 28.2 pg (ref 25.1–34.0)
MCHC: 32.8 g/dL (ref 31.5–36.0)
MCV: 86 fL (ref 79.5–101.0)
MONO#: 0 10*3/uL — AB (ref 0.1–0.9)
MONO%: 0.7 % (ref 0.0–14.0)
NEUT#: 2.1 10*3/uL (ref 1.5–6.5)
NEUT%: 72.4 % (ref 38.4–76.8)
Platelets: 141 10*3/uL — ABNORMAL LOW (ref 145–400)
RBC: 2.43 10*6/uL — AB (ref 3.70–5.45)
RDW: 13.9 % (ref 11.2–14.5)
WBC: 2.9 10*3/uL — AB (ref 3.9–10.3)
lymph#: 0.8 10*3/uL — ABNORMAL LOW (ref 0.9–3.3)

## 2013-10-27 LAB — HOLD TUBE, BLOOD BANK

## 2013-10-27 LAB — TECHNOLOGIST REVIEW: Technologist Review: 1

## 2013-10-27 LAB — PREPARE RBC (CROSSMATCH)

## 2013-10-27 MED ORDER — SODIUM CHLORIDE 0.9 % IV SOLN
250.0000 mL | Freq: Once | INTRAVENOUS | Status: AC
  Administered 2013-10-27: 250 mL via INTRAVENOUS

## 2013-10-27 MED ORDER — SODIUM CHLORIDE 0.9 % IJ SOLN
10.0000 mL | INTRAMUSCULAR | Status: AC | PRN
  Administered 2013-10-27: 10 mL
  Filled 2013-10-27: qty 10

## 2013-10-27 MED ORDER — DARBEPOETIN ALFA-POLYSORBATE 300 MCG/0.6ML IJ SOLN
300.0000 ug | Freq: Once | INTRAMUSCULAR | Status: AC
  Administered 2013-10-27: 300 ug via SUBCUTANEOUS
  Filled 2013-10-27: qty 0.6

## 2013-10-27 MED ORDER — HEPARIN SOD (PORK) LOCK FLUSH 100 UNIT/ML IV SOLN
500.0000 [IU] | Freq: Every day | INTRAVENOUS | Status: AC | PRN
  Administered 2013-10-27: 500 [IU]
  Filled 2013-10-27: qty 5

## 2013-10-27 NOTE — Patient Instructions (Signed)

## 2013-10-28 LAB — TYPE AND SCREEN
ABO/RH(D): AB NEG
Antibody Screen: NEGATIVE
DONOR AG TYPE: NEGATIVE
Unit division: 0

## 2013-11-10 ENCOUNTER — Other Ambulatory Visit (HOSPITAL_BASED_OUTPATIENT_CLINIC_OR_DEPARTMENT_OTHER): Payer: Medicare Other

## 2013-11-10 ENCOUNTER — Other Ambulatory Visit: Payer: Self-pay | Admitting: *Deleted

## 2013-11-10 ENCOUNTER — Ambulatory Visit (HOSPITAL_BASED_OUTPATIENT_CLINIC_OR_DEPARTMENT_OTHER): Payer: Medicare Other

## 2013-11-10 ENCOUNTER — Telehealth: Payer: Self-pay | Admitting: *Deleted

## 2013-11-10 VITALS — BP 125/39 | HR 87 | Temp 98.0°F

## 2013-11-10 VITALS — BP 115/79 | HR 72 | Temp 97.7°F | Resp 20

## 2013-11-10 DIAGNOSIS — D649 Anemia, unspecified: Secondary | ICD-10-CM

## 2013-11-10 DIAGNOSIS — D630 Anemia in neoplastic disease: Secondary | ICD-10-CM | POA: Diagnosis not present

## 2013-11-10 DIAGNOSIS — D469 Myelodysplastic syndrome, unspecified: Secondary | ICD-10-CM

## 2013-11-10 LAB — CBC WITH DIFFERENTIAL/PLATELET
BASO%: 0.3 % (ref 0.0–2.0)
Basophils Absolute: 0 10*3/uL (ref 0.0–0.1)
EOS%: 0.3 % (ref 0.0–7.0)
Eosinophils Absolute: 0 10*3/uL (ref 0.0–0.5)
HCT: 21.2 % — ABNORMAL LOW (ref 34.8–46.6)
HGB: 6.9 g/dL — CL (ref 11.6–15.9)
LYMPH%: 30.8 % (ref 14.0–49.7)
MCH: 27.9 pg (ref 25.1–34.0)
MCHC: 32.5 g/dL (ref 31.5–36.0)
MCV: 85.8 fL (ref 79.5–101.0)
MONO#: 0.1 10*3/uL (ref 0.1–0.9)
MONO%: 3.7 % (ref 0.0–14.0)
NEUT#: 1.9 10*3/uL (ref 1.5–6.5)
NEUT%: 64.9 % (ref 38.4–76.8)
Platelets: 140 10*3/uL — ABNORMAL LOW (ref 145–400)
RBC: 2.47 10*6/uL — ABNORMAL LOW (ref 3.70–5.45)
RDW: 14.6 % — AB (ref 11.2–14.5)
WBC: 3 10*3/uL — ABNORMAL LOW (ref 3.9–10.3)
lymph#: 0.9 10*3/uL (ref 0.9–3.3)
nRBC: 2 % — ABNORMAL HIGH (ref 0–0)

## 2013-11-10 LAB — TECHNOLOGIST REVIEW

## 2013-11-10 LAB — HOLD TUBE, BLOOD BANK

## 2013-11-10 LAB — PREPARE RBC (CROSSMATCH)

## 2013-11-10 MED ORDER — HEPARIN SOD (PORK) LOCK FLUSH 100 UNIT/ML IV SOLN
500.0000 [IU] | Freq: Every day | INTRAVENOUS | Status: AC | PRN
  Administered 2013-11-10: 500 [IU]
  Filled 2013-11-10: qty 5

## 2013-11-10 MED ORDER — DARBEPOETIN ALFA-POLYSORBATE 300 MCG/0.6ML IJ SOLN
300.0000 ug | Freq: Once | INTRAMUSCULAR | Status: AC
  Administered 2013-11-10: 300 ug via SUBCUTANEOUS
  Filled 2013-11-10: qty 0.6

## 2013-11-10 MED ORDER — SODIUM CHLORIDE 0.9 % IV SOLN
250.0000 mL | Freq: Once | INTRAVENOUS | Status: AC
  Administered 2013-11-10: 250 mL via INTRAVENOUS

## 2013-11-10 MED ORDER — SODIUM CHLORIDE 0.9 % IJ SOLN
10.0000 mL | INTRAMUSCULAR | Status: AC | PRN
  Administered 2013-11-10: 10 mL
  Filled 2013-11-10: qty 10

## 2013-11-10 NOTE — Patient Instructions (Signed)

## 2013-11-10 NOTE — Telephone Encounter (Signed)
Dau states someone told pt there was "something bad" on her labwork .  Informed daughter of nothing new on labs today.  Pt is anemic and needs transfusion.  Daughter understands and just wanted to make sure there wasn't something new wrong w/ labs.  She will come w/ pt on her next office visit w/ Dr. Alvy Bimler.

## 2013-11-11 LAB — TYPE AND SCREEN
ABO/RH(D): AB NEG
Antibody Screen: NEGATIVE
DONOR AG TYPE: NEGATIVE
UNIT DIVISION: 0

## 2013-11-15 ENCOUNTER — Telehealth: Payer: Self-pay | Admitting: Family Medicine

## 2013-11-15 NOTE — Telephone Encounter (Signed)
Patient Information:  Caller Name: [name]  Phone: [phone]  Patient: Meredith Rodriguez  Gender: Female  DOB: 1925/09/11  Age: 80 Years  PCP: Carolann Littler (Family Practice)  Office Follow Up:  Does the office need to follow up with this patient?: No  Instructions For The Office: N/A  RN Note:  Has been taking Tylenol 325mg  and helps for short time. Advised can take 650mg  every 4hrs as needed.  Symptoms  Reason For Call & Symptoms: Has had headache "for about 2 weeks". Has tenderness "from my neck up to top of my head".  Reviewed Health History In EMR: Yes  Reviewed Medications In EMR: Yes  Reviewed Allergies In EMR: Yes  Reviewed Surgeries / Procedures: Yes  Date of Onset of Symptoms: 11/01/2013  Treatments Tried: Tylenol  Treatments Tried Worked: Yes  Guideline(s) Used:  Headache  Disposition Per Guideline:   See Today in Office  Reason For Disposition Reached:   New headache and weak immune system (e.g., HIV positive, cancer chemotherapy, chronic steroid treatment)  Advice Given:  N/A  Patient Refused Recommendation:  Patient Will Make Own Appointment  Wants appointment for 7-21. Scheduled for 11-16-13 at 0930 with Dr. Sarajane Jews

## 2013-11-16 ENCOUNTER — Ambulatory Visit (INDEPENDENT_AMBULATORY_CARE_PROVIDER_SITE_OTHER): Payer: Medicare Other | Admitting: Family Medicine

## 2013-11-16 ENCOUNTER — Encounter: Payer: Self-pay | Admitting: Family Medicine

## 2013-11-16 VITALS — BP 146/57 | HR 82 | Temp 99.1°F | Ht 66.0 in | Wt 104.0 lb

## 2013-11-16 DIAGNOSIS — R51 Headache: Secondary | ICD-10-CM

## 2013-11-16 MED ORDER — DICLOFENAC SODIUM 75 MG PO TBEC: 75.0000 mg | DELAYED_RELEASE_TABLET | Freq: Two times a day (BID) | ORAL | Status: DC

## 2013-11-16 NOTE — Progress Notes (Signed)
Pre visit review using our clinic review tool, if applicable. No additional management support is needed unless otherwise documented below in the visit note. 

## 2013-11-16 NOTE — Progress Notes (Signed)
   Subjective:    Patient ID: Meredith Rodriguez, female    DOB: [birth year], 80 y.o.   MRN: [id number]  HPI Here for 2 and 1/2 weeks of a constant dull HA on the left side of the head. This starts in the left posterior neck and spreads over the top of the head. No blurred vision or nausea. Tylenol helps for a few hours at a time.    Review of Systems  Constitutional: Negative.   Eyes: Negative.   Neurological: Positive for headaches. Negative for dizziness, tremors, seizures, syncope, facial asymmetry, speech difficulty, weakness, light-headedness and numbness.       Objective:   Physical Exam  Constitutional: She is oriented to person, place, and time. She appears well-developed and well-nourished. No distress.  HENT:  Head: Normocephalic and atraumatic.  Right Ear: External ear normal.  Left Ear: External ear normal.  Nose: Nose normal.  Mouth/Throat: Oropharynx is clear and moist.  Eyes: Conjunctivae and EOM are normal. Pupils are equal, round, and reactive to light.  Neck: Normal range of motion. Neck supple. No thyromegaly present.  Musculoskeletal:  Mildly tender in the left posterior neck and at the left skull base   Lymphadenopathy:    She has no cervical adenopathy.  Neurological: She is alert and oriented to person, place, and time. She has normal reflexes. No cranial nerve deficit. She exhibits normal muscle tone. Coordination normal.          Assessment & Plan:  Tension headache. Use moist heat and Diclofenac.

## 2013-11-24 ENCOUNTER — Other Ambulatory Visit (HOSPITAL_BASED_OUTPATIENT_CLINIC_OR_DEPARTMENT_OTHER): Payer: Medicare Other

## 2013-11-24 ENCOUNTER — Ambulatory Visit (HOSPITAL_BASED_OUTPATIENT_CLINIC_OR_DEPARTMENT_OTHER): Payer: Medicare Other

## 2013-11-24 ENCOUNTER — Encounter: Payer: Self-pay | Admitting: Hematology and Oncology

## 2013-11-24 ENCOUNTER — Ambulatory Visit: Payer: Medicare Other

## 2013-11-24 ENCOUNTER — Ambulatory Visit (HOSPITAL_BASED_OUTPATIENT_CLINIC_OR_DEPARTMENT_OTHER): Payer: Medicare Other | Admitting: Hematology and Oncology

## 2013-11-24 ENCOUNTER — Telehealth: Payer: Self-pay | Admitting: Hematology and Oncology

## 2013-11-24 VITALS — BP 140/44 | HR 71 | Temp 97.6°F | Resp 18

## 2013-11-24 VITALS — BP 128/45 | HR 87 | Temp 98.7°F | Resp 20 | Ht 66.0 in | Wt 105.2 lb

## 2013-11-24 DIAGNOSIS — D72819 Decreased white blood cell count, unspecified: Secondary | ICD-10-CM

## 2013-11-24 DIAGNOSIS — D469 Myelodysplastic syndrome, unspecified: Secondary | ICD-10-CM

## 2013-11-24 DIAGNOSIS — D630 Anemia in neoplastic disease: Secondary | ICD-10-CM

## 2013-11-24 DIAGNOSIS — R519 Headache, unspecified: Secondary | ICD-10-CM

## 2013-11-24 DIAGNOSIS — R51 Headache: Secondary | ICD-10-CM

## 2013-11-24 LAB — CBC WITH DIFFERENTIAL/PLATELET
BASO%: 0.6 % (ref 0.0–2.0)
Basophils Absolute: 0 10*3/uL (ref 0.0–0.1)
EOS%: 0.3 % (ref 0.0–7.0)
Eosinophils Absolute: 0 10*3/uL (ref 0.0–0.5)
HCT: 20.8 % — ABNORMAL LOW (ref 34.8–46.6)
HGB: 6.9 g/dL — CL (ref 11.6–15.9)
LYMPH%: 27 % (ref 14.0–49.7)
MCH: 29.1 pg (ref 25.1–34.0)
MCHC: 33.2 g/dL (ref 31.5–36.0)
MCV: 87.8 fL (ref 79.5–101.0)
MONO#: 0.1 10*3/uL (ref 0.1–0.9)
MONO%: 3 % (ref 0.0–14.0)
NEUT%: 69.1 % (ref 38.4–76.8)
NEUTROS ABS: 2.3 10*3/uL (ref 1.5–6.5)
NRBC: 2 % — AB (ref 0–0)
Platelets: 136 10*3/uL — ABNORMAL LOW (ref 145–400)
RBC: 2.37 10*6/uL — AB (ref 3.70–5.45)
RDW: 15.1 % — AB (ref 11.2–14.5)
WBC: 3.3 10*3/uL — AB (ref 3.9–10.3)
lymph#: 0.9 10*3/uL (ref 0.9–3.3)

## 2013-11-24 LAB — HOLD TUBE, BLOOD BANK

## 2013-11-24 LAB — TECHNOLOGIST REVIEW

## 2013-11-24 MED ORDER — DARBEPOETIN ALFA-POLYSORBATE 500 MCG/ML IJ SOLN
500.0000 ug | Freq: Once | INTRAMUSCULAR | Status: AC
  Administered 2013-11-24: 500 ug via SUBCUTANEOUS
  Filled 2013-11-24: qty 1

## 2013-11-24 MED ORDER — HEPARIN SOD (PORK) LOCK FLUSH 100 UNIT/ML IV SOLN
500.0000 [IU] | Freq: Every day | INTRAVENOUS | Status: AC | PRN
  Administered 2013-11-24: 500 [IU]
  Filled 2013-11-24: qty 5

## 2013-11-24 MED ORDER — SODIUM CHLORIDE 0.9 % IJ SOLN
10.0000 mL | INTRAMUSCULAR | Status: AC | PRN
  Administered 2013-11-24: 10 mL
  Filled 2013-11-24: qty 10

## 2013-11-24 MED ORDER — SODIUM CHLORIDE 0.9 % IV SOLN
250.0000 mL | Freq: Once | INTRAVENOUS | Status: AC
  Administered 2013-11-24: 250 mL via INTRAVENOUS

## 2013-11-24 NOTE — Assessment & Plan Note (Signed)
This could be a symptom of anemia. She was prescribed diclofenac. I recommend to judicious use due to risk of GI bleed.

## 2013-11-24 NOTE — Progress Notes (Signed)
[facility] OFFICE PROGRESS NOTE  Patient Care Team: [name], MD as PCP - General  SUMMARY OF ONCOLOGIC HISTORY: Summary of her history is as follows: #30 March 2011, she was noted to have anemia with associated thrombocytosis. She became transfusion dependent and was referred to see a hematologist here for further evaluation. The patient has lack of reticulocytosis with associated elevated erythropoietin level of 197. #2 on 05/28/2011, bone marrow aspirate and biopsy was hypercellular with increased myelofibrosis. There were no evidence of increased blasts. #3 The patient was started on erythropoietin stimulating agent with Procrit injections 40,000 units every 2 weeks beginning of 11/20/2011 along with transfusion support. By October 2013, Procrit was deemed ineffective. She was subsequently recommended to consider chemotherapy. #4 On 02/19/2012, repeat bone marrow aspirate and biopsy show persistent myelofibrosis and hypercellular marrow. It was difficult to estimate percentage of blastic cells to do sampling error. By flow cytometry, 10% of abnormal cells were detected. #5 From 02/24/2012 to 05/08/2012, she received 3 cycles of decitabine. The last cycle of treatment was complicated by febrile neutropenia and pneumonia requiring hospitalization. Decision was made to discontinue chemotherapy and to return to clinic for transfusion support only. #6 Due to severe iron overload, she was started on Exjade around September 2014 to May 2015. #7 On 09/09/13: She had repeat bone marrow aspirate and biopsy which showed persistent myelodysplastic syndrome. Aranesp was restarted.  INTERVAL HISTORY: Please see below for problem oriented charting. She feels fatigued. Denies any chest pain, dizziness or lightheadedness. She complained of intermittent neck pain and was placed on diclofenac by her primary care provider. Denies recent infection.  REVIEW OF SYSTEMS:    Constitutional: Denies fevers, chills or abnormal weight loss Eyes: Denies blurriness of vision Ears, nose, mouth, throat, and face: Denies mucositis or sore throat Respiratory: Denies cough, dyspnea or wheezes Cardiovascular: Denies palpitation, chest discomfort or lower extremity swelling Gastrointestinal:  Denies nausea, heartburn or change in bowel habits Skin: Denies abnormal skin rashes Lymphatics: Denies new lymphadenopathy or easy bruising Neurological:Denies numbness, tingling or new weaknesses Behavioral/Psych: Mood is stable, no new changes  All other systems were reviewed with the patient and are negative.  I have reviewed the past medical history, past surgical history, social history and family history with the patient and they are unchanged from previous note.  ALLERGIES:  is allergic to amoxicillin; cephalexin; metformin and related; phenergan; and procardia.  MEDICATIONS:  Current Outpatient Prescriptions  Medication Sig Dispense Refill  . amLODipine (NORVASC) 10 MG tablet Take 1 tablet (10 mg total) by mouth daily.  90 tablet  3  . aspirin 81 MG tablet Take 81 mg by mouth daily.      . B-D UF III MINI PEN NEEDLES 31G X 5 MM MISC USE WITH LANTUS PEN DAILY AS INSTRUCTED  100 each  2  . cholecalciferol (VITAMIN D) 1000 UNITS tablet Take 1,000 Units by mouth daily.      . diclofenac (VOLTAREN) 75 MG EC tablet Take 1 tablet (75 mg total) by mouth 2 (two) times daily.  60 tablet  2  . FREESTYLE LITE test strip USE TO TEST BLOOD SUGAR 4 TIMES DAILY AS DIRECTED  30 each  10  . insulin lispro (HUMALOG KWIKPEN) 100 UNIT/ML SOPN Inject 4 Units into the skin 2 (two) times daily with a meal. Inject 4 units twice daily into skin if blood glucose is over 100 at lunch and dinner      . Lancets (FREESTYLE) lancets 1  each by Other route 2 (two) times daily.       Marland Kitchen LANTUS SOLOSTAR 100 UNIT/ML SOPN INJECT 10 UNITS INTO THE SKIN DAILY  15 mL  3  . lidocaine-prilocaine (EMLA) cream Apply  topically as needed.  30 g  4  . metoprolol (LOPRESSOR) 50 MG tablet Take 1 tablet (50 mg total) by mouth 2 (two) times daily.  180 tablet  3  . Omega-3 Fatty Acids (FISH OIL) 1000 MG CAPS Take 1,000 mg by mouth daily.       . simvastatin (ZOCOR) 5 MG tablet Take 5 mg by mouth at bedtime.      . [DISCONTINUED] glimepiride (AMARYL) 2 MG tablet Take 1 tablet (2 mg total) by mouth every morning.  30 tablet  11   Current Facility-Administered Medications  Medication Dose Route Frequency Provider Last Rate Last Dose  . TDaP (BOOSTRIX) injection 0.5 mL  0.5 mL Intramuscular Once [name], MD        PHYSICAL EXAMINATION: ECOG PERFORMANCE STATUS: 1 - Symptomatic but completely ambulatory  Filed Vitals:   11/24/13 0945  BP: 128/45  Pulse: 87  Temp: 98.7 F (37.1 C)  Resp: 20   Filed Weights   11/24/13 0945  Weight: 105 lb 3.2 oz (47.718 kg)    GENERAL:alert, no distress and comfortable SKIN: skin color is pale, texture, turgor are normal, no rashes or significant lesions EYES: normal, Conjunctiva are pale and non-injected, sclera clear Musculoskeletal:no cyanosis of digits and no clubbing  NEURO: alert & oriented x 3 with fluent speech, no focal motor/sensory deficits  LABORATORY DATA:  I have reviewed the data as listed    Component Value Date/Time   NA 141 09/29/2013 0830   NA 135 02/10/2013 0620   K 4.2 09/29/2013 0830   K 4.1 02/10/2013 0620   CL 101 02/10/2013 0620   CL 99 06/01/2012 1028   CO2 24 09/29/2013 0830   CO2 27 02/10/2013 0620   GLUCOSE 172* 09/29/2013 0830   GLUCOSE 196* 02/10/2013 0620   GLUCOSE 230* 06/01/2012 1028   BUN 26.6* 09/29/2013 0830   BUN 15 02/10/2013 0620   CREATININE 1.4* 09/29/2013 0830   CREATININE 0.62 02/10/2013 0620   CREATININE 0.69 07/18/2010 1202   CALCIUM 9.7 09/29/2013 0830   CALCIUM 8.4 02/10/2013 0620   PROT 7.3 09/29/2013 0830   PROT 5.7* 02/11/2013 1611   ALBUMIN 3.5 09/29/2013 0830   ALBUMIN 3.2* 02/07/2013 2047   AST 19 09/29/2013 0830    AST 33 02/07/2013 2047   ALT 17 09/29/2013 0830   ALT 34 02/07/2013 2047   ALKPHOS 84 09/29/2013 0830   ALKPHOS 55 02/07/2013 2047   BILITOT 0.62 09/29/2013 0830   BILITOT 0.9 02/07/2013 2047   GFRNONAA 80* 02/10/2013 0620   GFRAA >90 02/10/2013 0620    No results found for this basename: SPEP,  UPEP,   kappa and lambda light chains    Lab Results  Component Value Date   WBC 3.3* 11/24/2013   NEUTROABS 2.3 11/24/2013   HGB 6.9* 11/24/2013   HCT 20.8* 11/24/2013   MCV 87.8 11/24/2013   PLT 136 Large & giant platelets* 11/24/2013      Chemistry      Component Value Date/Time   NA 141 09/29/2013 0830   NA 135 02/10/2013 0620   K 4.2 09/29/2013 0830   K 4.1 02/10/2013 0620   CL 101 02/10/2013 0620   CL 99 06/01/2012 1028   CO2 24 09/29/2013 0830  CO2 27 02/10/2013 0620   BUN 26.6* 09/29/2013 0830   BUN 15 02/10/2013 0620   CREATININE 1.4* 09/29/2013 0830   CREATININE 0.62 02/10/2013 0620   CREATININE 0.69 07/18/2010 1202   GLU 215* 02/26/2012 1255      Component Value Date/Time   CALCIUM 9.7 09/29/2013 0830   CALCIUM 8.4 02/10/2013 0620   ALKPHOS 84 09/29/2013 0830   ALKPHOS 55 02/07/2013 2047   AST 19 09/29/2013 0830   AST 33 02/07/2013 2047   ALT 17 09/29/2013 0830   ALT 34 02/07/2013 2047   BILITOT 0.62 09/29/2013 0830   BILITOT 0.9 02/07/2013 2047    ASSESSMENT & PLAN:  Myelodysplastic syndrome I had a long discussion with the patient and her daughter. In terms of supportive care, I recommend transfusion and erythropoietin stimulating agents with darbepoetin. I recommend we increase the dose of darbopoeitin to 500 mcg every 2 weeks for hemoglobin less than 10 g. I also recommend lowering her blood transfusion threshold to 7 g and only transfuse 1 unit of blood each time. The patient wants to get away from premedication if possible because Benadryl will make her sleepy. If the current strategy is not working, I will add danazol in the future.     Iron overload due to repeated red  blood cell transfusions I do not recommend iron chelating agents.  Leukopenia This could be related to disease. She is not symptomatic. We'll observe.  Anemia in neoplastic disease We discussed some of the risks, benefits, and alternatives of blood transfusions. The patient is symptomatic from anemia and the hemoglobin level is critically low.  Some of the side-effects to be expected including risks of transfusion reactions, chills, infection, syndrome of volume overload and risk of hospitalization from various reasons and the patient is willing to proceed.   Headache This could be a symptom of anemia. She was prescribed diclofenac. I recommend to judicious use due to risk of GI bleed.    No orders of the defined types were placed in this encounter.   All questions were answered. The patient knows to call the clinic with any problems, questions or concerns. No barriers to learning was detected. I spent 25 minutes counseling the patient face to face. The total time spent in the appointment was 30 minutes and more than 50% was on counseling and review of test results     [facility], [place], MD 11/24/2013 10:20 AM

## 2013-11-24 NOTE — Assessment & Plan Note (Signed)
We discussed some of the risks, benefits, and alternatives of blood transfusions. The patient is symptomatic from anemia and the hemoglobin level is critically low.  Some of the side-effects to be expected including risks of transfusion reactions, chills, infection, syndrome of volume overload and risk of hospitalization from various reasons and the patient is willing to proceed.

## 2013-11-24 NOTE — Assessment & Plan Note (Signed)
This could be related to disease. She is not symptomatic. We'll observe.

## 2013-11-24 NOTE — Assessment & Plan Note (Signed)
I do not recommend iron chelating agents.

## 2013-11-24 NOTE — Assessment & Plan Note (Signed)
I had a long discussion with the patient and her daughter. In terms of supportive care, I recommend transfusion and erythropoietin stimulating agents with darbepoetin. I recommend we increase the dose of darbopoeitin to 500 mcg every 2 weeks for hemoglobin less than 10 g. I also recommend lowering her blood transfusion threshold to 7 g and only transfuse 1 unit of blood each time. The patient wants to get away from premedication if possible because Benadryl will make her sleepy. If the current strategy is not working, I will add danazol in the future.

## 2013-11-24 NOTE — Telephone Encounter (Signed)
gv adn printed appt sched and avs for pt for Aug and Sept.... °

## 2013-11-25 ENCOUNTER — Other Ambulatory Visit: Payer: Self-pay | Admitting: Internal Medicine

## 2013-11-25 LAB — TYPE AND SCREEN
ABO/RH(D): AB NEG
ANTIBODY SCREEN: NEGATIVE
Donor AG Type: NEGATIVE
UNIT DIVISION: 0

## 2013-11-26 ENCOUNTER — Telehealth: Payer: Self-pay | Admitting: Family Medicine

## 2013-11-26 DIAGNOSIS — I10 Essential (primary) hypertension: Secondary | ICD-10-CM

## 2013-11-26 MED ORDER — AMLODIPINE BESYLATE 10 MG PO TABS: 10.0000 mg | ORAL_TABLET | Freq: Every day | ORAL | Status: DC

## 2013-11-26 NOTE — Telephone Encounter (Signed)
Rx sent 

## 2013-11-26 NOTE — Telephone Encounter (Signed)
[id number] [place], [place] - [address] requesting re-fill on amLODipine (NORVASC) 10 MG tablet

## 2013-12-05 ENCOUNTER — Emergency Department (HOSPITAL_COMMUNITY): Payer: Medicare Other

## 2013-12-05 ENCOUNTER — Emergency Department (HOSPITAL_COMMUNITY)
Admission: EM | Admit: 2013-12-05 | Discharge: 2013-12-05 | Disposition: A | Discharge status: Home / Self Care | Payer: Medicare Other | Attending: Emergency Medicine | Admitting: Emergency Medicine

## 2013-12-05 ENCOUNTER — Encounter (HOSPITAL_COMMUNITY): Payer: Self-pay | Admitting: Emergency Medicine

## 2013-12-05 DIAGNOSIS — R0602 Shortness of breath: Secondary | ICD-10-CM | POA: Diagnosis not present

## 2013-12-05 DIAGNOSIS — Z8739 Personal history of other diseases of the musculoskeletal system and connective tissue: Secondary | ICD-10-CM | POA: Insufficient documentation

## 2013-12-05 DIAGNOSIS — E7800 Pure hypercholesterolemia, unspecified: Secondary | ICD-10-CM | POA: Diagnosis not present

## 2013-12-05 DIAGNOSIS — Z794 Long term (current) use of insulin: Secondary | ICD-10-CM | POA: Diagnosis not present

## 2013-12-05 DIAGNOSIS — Z7982 Long term (current) use of aspirin: Secondary | ICD-10-CM | POA: Insufficient documentation

## 2013-12-05 DIAGNOSIS — Z8719 Personal history of other diseases of the digestive system: Secondary | ICD-10-CM | POA: Diagnosis not present

## 2013-12-05 DIAGNOSIS — H1030 Unspecified acute conjunctivitis, unspecified eye: Secondary | ICD-10-CM | POA: Insufficient documentation

## 2013-12-05 DIAGNOSIS — D649 Anemia, unspecified: Secondary | ICD-10-CM | POA: Diagnosis not present

## 2013-12-05 DIAGNOSIS — D6489 Other specified anemias: Secondary | ICD-10-CM

## 2013-12-05 DIAGNOSIS — Z880 Allergy status to penicillin: Secondary | ICD-10-CM | POA: Diagnosis not present

## 2013-12-05 DIAGNOSIS — Z79899 Other long term (current) drug therapy: Secondary | ICD-10-CM | POA: Diagnosis not present

## 2013-12-05 DIAGNOSIS — E119 Type 2 diabetes mellitus without complications: Secondary | ICD-10-CM | POA: Diagnosis not present

## 2013-12-05 DIAGNOSIS — Z791 Long term (current) use of non-steroidal anti-inflammatories (NSAID): Secondary | ICD-10-CM | POA: Insufficient documentation

## 2013-12-05 DIAGNOSIS — I10 Essential (primary) hypertension: Secondary | ICD-10-CM | POA: Insufficient documentation

## 2013-12-05 LAB — COMPREHENSIVE METABOLIC PANEL
ALK PHOS: 99 U/L (ref 39–117)
ALT: 26 U/L (ref 0–35)
ANION GAP: 13 (ref 5–15)
AST: 29 U/L (ref 0–37)
Albumin: 3.6 g/dL (ref 3.5–5.2)
BILIRUBIN TOTAL: 0.5 mg/dL (ref 0.3–1.2)
BUN: 24 mg/dL — AB (ref 6–23)
CHLORIDE: 99 meq/L (ref 96–112)
CO2: 25 mEq/L (ref 19–32)
CREATININE: 0.83 mg/dL (ref 0.50–1.10)
Calcium: 9.1 mg/dL (ref 8.4–10.5)
GFR calc Af Amer: 72 mL/min — ABNORMAL LOW (ref >90)
GFR, EST NON AFRICAN AMERICAN: 62 mL/min — AB (ref >90)
Glucose, Bld: 140 mg/dL — ABNORMAL HIGH (ref 70–99)
Potassium: 4.5 mEq/L (ref 3.7–5.3)
Sodium: 137 mEq/L (ref 137–147)
Total Protein: 7 g/dL (ref 6.0–8.3)

## 2013-12-05 LAB — CBC WITH DIFFERENTIAL/PLATELET
BASOS PCT: 1 % (ref 0–1)
Basophils Absolute: 0 10*3/uL (ref 0.0–0.1)
EOS ABS: 0 10*3/uL (ref 0.0–0.7)
Eosinophils Relative: 0 % (ref 0–5)
HCT: 21.2 % — ABNORMAL LOW (ref 36.0–46.0)
Hemoglobin: 7.1 g/dL — ABNORMAL LOW (ref 12.0–15.0)
Lymphocytes Relative: 23 % (ref 12–46)
Lymphs Abs: 0.8 10*3/uL (ref 0.7–4.0)
MCH: 30 pg (ref 26.0–34.0)
MCHC: 33.5 g/dL (ref 30.0–36.0)
MCV: 89.5 fL (ref 78.0–100.0)
MONO ABS: 0.2 10*3/uL (ref 0.1–1.0)
Monocytes Relative: 5 % (ref 3–12)
NEUTROS ABS: 2.3 10*3/uL (ref 1.7–7.7)
Neutrophils Relative %: 71 % (ref 43–77)
Platelets: 163 10*3/uL (ref 150–400)
RBC: 2.37 MIL/uL — ABNORMAL LOW (ref 3.87–5.11)
RDW: 15.2 % (ref 11.5–15.5)
WBC Morphology: INCREASED
WBC: 3.3 10*3/uL — ABNORMAL LOW (ref 4.0–10.5)

## 2013-12-05 LAB — I-STAT TROPONIN, ED: Troponin i, poc: 0 ng/mL (ref 0.00–0.08)

## 2013-12-05 LAB — PRO B NATRIURETIC PEPTIDE: Pro B Natriuretic peptide (BNP): 3277 pg/mL — ABNORMAL HIGH (ref 0–450)

## 2013-12-05 LAB — PREPARE RBC (CROSSMATCH)

## 2013-12-05 MED ORDER — HEPARIN SOD (PORK) LOCK FLUSH 100 UNIT/ML IV SOLN
500.0000 [IU] | Freq: Once | INTRAVENOUS | Status: AC
  Administered 2013-12-05: 500 [IU]
  Filled 2013-12-05: qty 5

## 2013-12-05 MED ORDER — SODIUM CHLORIDE 0.9 % IV SOLN
10.0000 mL/h | Freq: Once | INTRAVENOUS | Status: AC
  Administered 2013-12-05: 10 mL/h via INTRAVENOUS

## 2013-12-05 NOTE — ED Notes (Signed)
Per MD request, blood bank called to check on status on blood.  Informed by blood bank that a discrepancy needed to be resolved before the blood would be ready for transfusion.  Dr. Darl Householder made aware.

## 2013-12-05 NOTE — Discharge Instructions (Signed)
Follow up with your oncologist on Wednesday.   Return to ER if you have worse shortness of breath, chest pain, weakness.

## 2013-12-05 NOTE — ED Provider Notes (Signed)
CSN: [id number]     Arrival date & time 12/05/13  1734 History   First MD Initiated Contact with Patient 12/05/13 1737     Chief Complaint  Patient presents with  . Shortness of Breath     (Consider location/radiation/quality/duration/timing/severity/associated sxs/prior Treatment) The history is provided by the patient.  Meredith Rodriguez is a 80 y.o. female hx of HTN, DM, IBS, leukemia here with shortness of breath. She gets RBC transfusions every other week, last time was a week ago. She has been feeling shortness of breath over the last several days, worse with laying down. Feeling generally tired and weak. Denies fever or chills or chest pain. Denies abdominal pain or vomiting or black stools. Denies hx of CAD or CHF.    Past Medical History  Diagnosis Date  . Hypertension   . Diabetes mellitus   . Hyperkalemia   . IBS (irritable bowel syndrome)   . Hypercholesterolemia   . Cervical radiculopathy   . Anemia   . Cancer     leukemia  . Leukemia    Past Surgical History  Procedure Laterality Date  . Varicose veins      surgey 1959  . Tonsilectomy, adenoidectomy, bilateral myringotomy and tubes    . Esophagogastroduodenoscopy  04/01/2011    Procedure: ESOPHAGOGASTRODUODENOSCOPY (EGD);  Surgeon: Missy Sabins, MD;  Location: [facility] ENDOSCOPY;  Service: Endoscopy;  Laterality: N/A;   Family History  Problem Relation Age of Onset  . Stroke Mother   . Hypertension Father   . Cancer Sister   . Cancer Sister   . Diabetes Sister   . Cancer Sister     colon cancer  . Cancer Daughter     breast cancer   History  Substance Use Topics  . Smoking status: Never Smoker   . Smokeless tobacco: Never Used  . Alcohol Use: No   OB History   Grav Para Term Preterm Abortions TAB SAB Ect Mult Living                 Review of Systems  Respiratory: Positive for shortness of breath.   All other systems reviewed and are negative.     Allergies  Amoxicillin; Cephalexin; Metformin and  related; Phenergan; and Procardia  Home Medications   Prior to Admission medications   Medication Sig Start Date End Date Taking? Authorizing Provider  amLODipine (NORVASC) 10 MG tablet Take 1 tablet (10 mg total) by mouth daily. 11/26/13  Yes [name], MD  aspirin 81 MG tablet Take 81 mg by mouth daily.   Yes Historical Provider, MD  cholecalciferol (VITAMIN D) 1000 UNITS tablet Take 1,000 Units by mouth daily.   Yes Historical Provider, MD  diclofenac (VOLTAREN) 75 MG EC tablet Take 1 tablet (75 mg total) by mouth 2 (two) times daily. 11/16/13  Yes Laurey Morale, MD  insulin glargine (LANTUS) 100 UNIT/ML injection Inject 10 Units into the skin at bedtime.   Yes Historical Provider, MD  insulin lispro (HUMALOG KWIKPEN) 100 UNIT/ML SOPN Inject 4 Units into the skin 2 (two) times daily with a meal. Inject 4 units twice daily into skin if blood glucose is over 100 at lunch and dinner   Yes Historical Provider, MD  lidocaine-prilocaine (EMLA) cream Apply 1 application topically as needed (to access port).   Yes Historical Provider, MD  metoprolol (LOPRESSOR) 50 MG tablet Take 1 tablet (50 mg total) by mouth 2 (two) times daily. 06/09/13  Yes [name], MD  Omega-3 Fatty Acids (FISH OIL) 1000 MG CAPS Take 1,000 mg by mouth daily.    Yes Historical Provider, MD  simvastatin (ZOCOR) 5 MG tablet Take 5 mg by mouth at bedtime.   Yes Historical Provider, MD   BP 127/40  Pulse 71  Temp(Src) 97.8 F (36.6 C) (Oral)  Resp 18  SpO2 96% Physical Exam  Nursing note and vitals reviewed. Constitutional: She is oriented to person, place, and time.  Chronically ill, thin   HENT:  Head: Normocephalic.  Mouth/Throat: Oropharynx is clear and moist.  Eyes: EOM are normal. Pupils are equal, round, and reactive to light.  Conjunctiva slightly pale   Neck: Normal range of motion. Neck supple.  Cardiovascular: Normal rate, regular rhythm and normal heart sounds.   Pulmonary/Chest: Effort normal.   Minimal bibasilar crackles   Abdominal: Soft. Bowel sounds are normal. She exhibits no distension. There is no tenderness. There is no rebound.  Musculoskeletal: Normal range of motion.  1+ edema   Neurological: She is alert and oriented to person, place, and time. No cranial nerve deficit. Coordination normal.  Skin: Skin is warm and dry.  Psychiatric: She has a normal mood and affect. Her behavior is normal. Judgment and thought content normal.    ED Course  Procedures (including critical care time) Labs Review Labs Reviewed  CBC WITH DIFFERENTIAL - Abnormal; Notable for the following:    WBC 3.3 (*)    RBC 2.37 (*)    Hemoglobin 7.1 (*)    HCT 21.2 (*)    All other components within normal limits  COMPREHENSIVE METABOLIC PANEL - Abnormal; Notable for the following:    Glucose, Bld 140 (*)    BUN 24 (*)    GFR calc non Af Amer 62 (*)    GFR calc Af Amer 72 (*)    All other components within normal limits  PRO B NATRIURETIC PEPTIDE - Abnormal; Notable for the following:    Pro B Natriuretic peptide (BNP) 3277.0 (*)    All other components within normal limits  I-STAT TROPOININ, ED  TYPE AND SCREEN  PREPARE RBC (CROSSMATCH)    Imaging Review Dg Chest 2 View  12/05/2013   CLINICAL DATA:  Shortness of breath, weakness, leukemia  EXAM: CHEST  2 VIEW  COMPARISON:  02/11/2013  FINDINGS: Mild patchy bilateral lower lobe opacities, atelectasis versus pneumonia. No frank interstitial edema. Small right pleural effusion. No pneumothorax.  The heart is normal in size.  Right chest power port terminating at the cavoatrial junction.  IMPRESSION: Mild patchy bilateral lower lobe opacities, atelectasis versus pneumonia.  Small right pleural effusion.   Electronically Signed   By: Julian Hy M.D.   On: 12/05/2013 18:13     EKG Interpretation None      MDM   Final diagnoses:  None   Meredith Rodriguez is a 80 y.o. female here with SOB. Consider new onset CHF vs symptomatic anemia. Will  get labs, BNP, CXR.   7 PM Hg 7.1. I called oncology, Dr. Marin Olp, who recommend transfuse with 1 U PRBC and then discharge. BNP 3000, but was more elevated previously. CXR didn't show pulmonary edema.   11:15 PM Received 1 U PRBC. No reactions. Has oncology f/u in 3 days.     Wandra Arthurs, MD 12/05/13 [phone]

## 2013-12-05 NOTE — ED Notes (Signed)
Pt's blood transfusion stopped.  Pt does not report any changes or having any symptoms.  No reaction to blood products suspected at this time.

## 2013-12-05 NOTE — ED Notes (Signed)
Pt ambulated to the restroom without assistance, and without oxygen. Pts 02 sats dropped to 86%.

## 2013-12-05 NOTE — ED Notes (Signed)
She c/o shortness of breath.  She states she is a leukemia pt. Who routinely receives blood transfusions.  She is in no distress.

## 2013-12-05 NOTE — ED Notes (Signed)
No reaction to blood products suspected. 

## 2013-12-06 LAB — TYPE AND SCREEN
ABO/RH(D): AB NEG
Antibody Screen: NEGATIVE
Donor AG Type: NEGATIVE
Unit division: 0

## 2013-12-08 ENCOUNTER — Ambulatory Visit (HOSPITAL_BASED_OUTPATIENT_CLINIC_OR_DEPARTMENT_OTHER): Payer: Medicare Other

## 2013-12-08 ENCOUNTER — Other Ambulatory Visit (HOSPITAL_BASED_OUTPATIENT_CLINIC_OR_DEPARTMENT_OTHER): Payer: Medicare Other

## 2013-12-08 VITALS — BP 158/49 | HR 76 | Temp 97.3°F

## 2013-12-08 DIAGNOSIS — D469 Myelodysplastic syndrome, unspecified: Secondary | ICD-10-CM

## 2013-12-08 DIAGNOSIS — D649 Anemia, unspecified: Secondary | ICD-10-CM

## 2013-12-08 LAB — CBC WITH DIFFERENTIAL/PLATELET
BASO%: 1.2 % (ref 0.0–2.0)
BASOS ABS: 0 10*3/uL (ref 0.0–0.1)
EOS%: 0.3 % (ref 0.0–7.0)
Eosinophils Absolute: 0 10*3/uL (ref 0.0–0.5)
HEMATOCRIT: 25.9 % — AB (ref 34.8–46.6)
HGB: 8.7 g/dL — ABNORMAL LOW (ref 11.6–15.9)
LYMPH%: 20.9 % (ref 14.0–49.7)
MCH: 29 pg (ref 25.1–34.0)
MCHC: 33.6 g/dL (ref 31.5–36.0)
MCV: 86.3 fL (ref 79.5–101.0)
MONO#: 0 10*3/uL — AB (ref 0.1–0.9)
MONO%: 0.3 % (ref 0.0–14.0)
NEUT%: 77.3 % — AB (ref 38.4–76.8)
NEUTROS ABS: 2.9 10*3/uL (ref 1.5–6.5)
PLATELETS: 180 10*3/uL (ref 145–400)
RBC: 3 10*6/uL — AB (ref 3.70–5.45)
RDW: 15 % — ABNORMAL HIGH (ref 11.2–14.5)
WBC: 3.7 10*3/uL — ABNORMAL LOW (ref 3.9–10.3)
lymph#: 0.8 10*3/uL — ABNORMAL LOW (ref 0.9–3.3)

## 2013-12-08 LAB — TECHNOLOGIST REVIEW

## 2013-12-08 LAB — HOLD TUBE, BLOOD BANK

## 2013-12-08 MED ORDER — DARBEPOETIN ALFA-POLYSORBATE 500 MCG/ML IJ SOLN
500.0000 ug | Freq: Once | INTRAMUSCULAR | Status: AC
  Administered 2013-12-08: 500 ug via SUBCUTANEOUS
  Filled 2013-12-08: qty 1

## 2013-12-09 ENCOUNTER — Encounter: Payer: Self-pay | Admitting: Family Medicine

## 2013-12-09 ENCOUNTER — Ambulatory Visit (INDEPENDENT_AMBULATORY_CARE_PROVIDER_SITE_OTHER): Payer: Medicare Other | Admitting: Family Medicine

## 2013-12-09 VITALS — BP 138/70 | HR 84 | Temp 97.5°F | Wt 110.0 lb

## 2013-12-09 DIAGNOSIS — R0600 Dyspnea, unspecified: Secondary | ICD-10-CM

## 2013-12-09 DIAGNOSIS — R609 Edema, unspecified: Secondary | ICD-10-CM

## 2013-12-09 DIAGNOSIS — R600 Localized edema: Secondary | ICD-10-CM

## 2013-12-09 DIAGNOSIS — R0609 Other forms of dyspnea: Secondary | ICD-10-CM

## 2013-12-09 DIAGNOSIS — D469 Myelodysplastic syndrome, unspecified: Secondary | ICD-10-CM

## 2013-12-09 DIAGNOSIS — R0989 Other specified symptoms and signs involving the circulatory and respiratory systems: Secondary | ICD-10-CM

## 2013-12-09 MED ORDER — POTASSIUM CHLORIDE ER 10 MEQ PO TBCR: 10.0000 meq | EXTENDED_RELEASE_TABLET | Freq: Every day | ORAL | Status: AC

## 2013-12-09 MED ORDER — AMLODIPINE BESYLATE 5 MG PO TABS: 5.0000 mg | ORAL_TABLET | Freq: Every day | ORAL | Status: DC

## 2013-12-09 MED ORDER — FUROSEMIDE 20 MG PO TABS: 20.0000 mg | ORAL_TABLET | Freq: Every day | ORAL | Status: AC

## 2013-12-09 NOTE — Progress Notes (Signed)
Pre visit review using our clinic review tool, if applicable. No additional management support is needed unless otherwise documented below in the visit note. 

## 2013-12-09 NOTE — Progress Notes (Signed)
   Subjective:    Patient ID: Meredith Rodriguez, female    DOB: [birth year], 80 y.o.   MRN: [id number]  HPI Patient seen for emergency department followup. She has chronic problems including type 2 diabetes, diabetic neuropathy, hyperlipidemia, hypertension, myelodysplastic syndrome.  She's had one to 2 weeks of some progressive dyspnea. She was seen Sunday in ED with hemoglobin 7.1 and was transfused one unit packed red blood cells. She still has dyspnea at this time and especially orthopnea. Occasional cough. No fever.  Labs through hematologist yesterday hemoglobin 8.7. Patient has no pleuritic pain and no chest pain. Echocardiogram 2012 ejection fraction 65%. Recent GFR 62. Chest x-ray in the emergency department no acute pulmonary edema but BNP level over 3000.  She does take amlodipine 10 mg daily and gabapentin both of which could be contributing some to edema issues. There is concern for high output heart failure secondary to her chronic anemia  Past Medical History  Diagnosis Date  . Hypertension   . Diabetes mellitus   . Hyperkalemia   . IBS (irritable bowel syndrome)   . Hypercholesterolemia   . Cervical radiculopathy   . Anemia   . Cancer     leukemia  . Leukemia    Past Surgical History  Procedure Laterality Date  . Varicose veins      surgey 1959  . Tonsilectomy, adenoidectomy, bilateral myringotomy and tubes    . Esophagogastroduodenoscopy  04/01/2011    Procedure: ESOPHAGOGASTRODUODENOSCOPY (EGD);  Surgeon: Missy Sabins, MD;  Location: [facility] ENDOSCOPY;  Service: Endoscopy;  Laterality: N/A;    reports that she has never smoked. She has never used smokeless tobacco. She reports that she does not drink alcohol or use illicit drugs. family history includes Cancer in her daughter, sister, sister, and sister; Diabetes in her sister; Hypertension in her father; Stroke in her mother. Allergies  Allergen Reactions  . Amoxicillin [Amoxicillin]     Upset stomach  . Cephalexin Nausea  Only  . Metformin And Related     Gi problems  . Phenergan [Promethazine Hcl] Other (See Comments)    Unknown   . Procardia [Nifedipine]     dizziness      Review of Systems  Constitutional: Negative for fever and chills.  Respiratory: Positive for shortness of breath. Negative for cough and wheezing.   Cardiovascular: Positive for leg swelling. Negative for chest pain and palpitations.  Gastrointestinal: Negative for abdominal pain.  Neurological: Negative for dizziness and syncope.  Psychiatric/Behavioral: Negative for agitation.       Objective:   Physical Exam  Constitutional: She appears well-developed and well-nourished. No distress.  Neck: Neck supple. No thyromegaly present.  Cardiovascular: Normal rate.  Exam reveals no gallop.   Pulmonary/Chest: Effort normal.  She has a few faint crackles right base greater than left  Musculoskeletal: She exhibits edema.  1+ pitting edema legs bilaterally  Lymphadenopathy:    She has no cervical adenopathy.          Assessment & Plan:  Patient has multiple chronic problems as above including recurrent anemia secondary to myelodysplastic syndrome. She presents with increased peripheral edema which is likely multifactorial (amlodipine, gabapentin,?CHF).  Dyspnea with recent BNP of over 3000.  History of normal systolic function. Reduce amlodipine to 5 mg daily. Start low-dose furosemide 20 mg daily along with K-Lor 10 mEq once daily. Elevate legs frequently. Reassess in 4-5 days. Recheck basic metabolic panel then.

## 2013-12-15 ENCOUNTER — Telehealth: Payer: Self-pay | Admitting: *Deleted

## 2013-12-15 ENCOUNTER — Ambulatory Visit (INDEPENDENT_AMBULATORY_CARE_PROVIDER_SITE_OTHER): Payer: Medicare Other | Admitting: Family Medicine

## 2013-12-15 ENCOUNTER — Encounter: Payer: Self-pay | Admitting: Family Medicine

## 2013-12-15 VITALS — BP 138/60 | HR 80 | Temp 97.2°F | Wt 116.0 lb

## 2013-12-15 DIAGNOSIS — E1142 Type 2 diabetes mellitus with diabetic polyneuropathy: Secondary | ICD-10-CM

## 2013-12-15 DIAGNOSIS — R600 Localized edema: Secondary | ICD-10-CM

## 2013-12-15 DIAGNOSIS — IMO0002 Reserved for concepts with insufficient information to code with codable children: Secondary | ICD-10-CM

## 2013-12-15 DIAGNOSIS — R609 Edema, unspecified: Secondary | ICD-10-CM

## 2013-12-15 DIAGNOSIS — IMO0001 Reserved for inherently not codable concepts without codable children: Secondary | ICD-10-CM

## 2013-12-15 DIAGNOSIS — E1165 Type 2 diabetes mellitus with hyperglycemia: Secondary | ICD-10-CM

## 2013-12-15 DIAGNOSIS — I10 Essential (primary) hypertension: Secondary | ICD-10-CM

## 2013-12-15 DIAGNOSIS — E1149 Type 2 diabetes mellitus with other diabetic neurological complication: Secondary | ICD-10-CM

## 2013-12-15 NOTE — Patient Instructions (Signed)
Try increasing Gabapentin to 100 mg two tablets three times daily Elevated legs frequently.

## 2013-12-15 NOTE — Progress Notes (Signed)
Pre visit review using our clinic review tool, if applicable. No additional management support is needed unless otherwise documented below in the visit note. 

## 2013-12-15 NOTE — Telephone Encounter (Signed)
Received notification from [employer].  Gave letter to care management.

## 2013-12-15 NOTE — Progress Notes (Signed)
Subjective:    Patient ID: Meredith Rodriguez, female    DOB: 26-Apr-1926, 80 y.o.   MRN: [id number]  HPI  Patient seen for medical followup. She has history of myelodysplastic syndrome with chronic anemia. She was seen recently with increased edema and dyspnea. Refer to prior note:  "Patient seen for emergency department followup. She has chronic problems including type 2 diabetes, diabetic neuropathy, hyperlipidemia, hypertension, myelodysplastic syndrome. She's had one to 2 weeks of some progressive dyspnea. She was seen Sunday in ED with hemoglobin 7.1 and was transfused one unit packed red blood cells. She still has dyspnea at this time and especially orthopnea. Occasional cough. No fever.  Labs through hematologist yesterday hemoglobin 8.7. Patient has no pleuritic pain and no chest pain. Echocardiogram 2012 ejection fraction 65%. Recent GFR 62. Chest x-ray in the emergency department no acute pulmonary edema but BNP level over 3000.  She does take amlodipine 10 mg daily and gabapentin both of which could be contributing some to edema issues. There is concern for high output heart failure secondary to her chronic anemia "  We reduced her amlodipine to 5 mg and started low-dose furosemide 20 mg along with potassium supplement. Her edema has improved but not fully resolved. She still has some dyspnea when lying supine. She is not weighing herself regularly at home.  She has followup CBC with hematology next week. No history of systolic dysfunction. Question of high output cardiac failure  Chronic peripheral neuropathy pains.  On low dose gabapentin and not controlling well.  Past Medical History  Diagnosis Date  . Hypertension   . Diabetes mellitus   . Hyperkalemia   . IBS (irritable bowel syndrome)   . Hypercholesterolemia   . Cervical radiculopathy   . Anemia   . Cancer     leukemia  . Leukemia    Past Surgical History  Procedure Laterality Date  . Varicose veins      surgey 1959  .  Tonsilectomy, adenoidectomy, bilateral myringotomy and tubes    . Esophagogastroduodenoscopy  04/01/2011    Procedure: ESOPHAGOGASTRODUODENOSCOPY (EGD);  Surgeon: Missy Sabins, MD;  Location: [facility] ENDOSCOPY;  Service: Endoscopy;  Laterality: N/A;    reports that she has never smoked. She has never used smokeless tobacco. She reports that she does not drink alcohol or use illicit drugs. family history includes Cancer in her daughter, sister, sister, and sister; Diabetes in her sister; Hypertension in her father; Stroke in her mother. Allergies  Allergen Reactions  . Amoxicillin [Amoxicillin]     Upset stomach  . Cephalexin Nausea Only  . Metformin And Related     Gi problems  . Phenergan [Promethazine Hcl] Other (See Comments)    Unknown   . Procardia [Nifedipine]     dizziness      Review of Systems  Constitutional: Negative for unexpected weight change.  Respiratory: Positive for shortness of breath. Negative for cough and wheezing.   Cardiovascular: Positive for leg swelling. Negative for chest pain and palpitations.  Gastrointestinal: Negative for abdominal pain.  Neurological: Negative for syncope and headaches.  Psychiatric/Behavioral: Negative for confusion.       Objective:   Physical Exam  Constitutional:  Alert, thin elderly female in no distress  Cardiovascular: Normal rate and regular rhythm.   Pulmonary/Chest: Effort normal and breath sounds normal. No respiratory distress. She has no wheezes. She has no rales.  Musculoskeletal:  Only trace pitting edema lower legs bilaterally which is improved from last week  Neurological:  She is alert.          Assessment & Plan:  Bilateral leg edema. Improved following reduction in dosage of amlodipine and addition of low-dose furosemide. Continue current medications. She gets labs next week at hematologist. Elevate legs frequently.  Chronic neuropathic leg pain. Poorly controlled.  We explained gabapentin can cause leg  edema but she's on very low doses. Cautious titration to 200 mg 3 times a day

## 2013-12-20 NOTE — Telephone Encounter (Signed)
Charting error.

## 2013-12-21 ENCOUNTER — Telehealth: Payer: Self-pay | Admitting: *Deleted

## 2013-12-21 ENCOUNTER — Ambulatory Visit (HOSPITAL_BASED_OUTPATIENT_CLINIC_OR_DEPARTMENT_OTHER): Payer: Medicare Other

## 2013-12-21 ENCOUNTER — Ambulatory Visit (HOSPITAL_COMMUNITY)
Admission: RE | Admit: 2013-12-21 | Discharge: 2013-12-21 | Disposition: A | Discharge status: Home / Self Care | Payer: Medicare Other | Source: Ambulatory Visit | Attending: Oncology | Admitting: Oncology

## 2013-12-21 ENCOUNTER — Other Ambulatory Visit: Payer: Self-pay | Admitting: Hematology and Oncology

## 2013-12-21 ENCOUNTER — Telehealth: Payer: Self-pay | Admitting: Medical Oncology

## 2013-12-21 VITALS — BP 157/50 | HR 75 | Temp 97.2°F | Resp 18

## 2013-12-21 DIAGNOSIS — D630 Anemia in neoplastic disease: Secondary | ICD-10-CM

## 2013-12-21 DIAGNOSIS — D469 Myelodysplastic syndrome, unspecified: Secondary | ICD-10-CM

## 2013-12-21 LAB — CBC WITH DIFFERENTIAL/PLATELET
BASO%: 0.8 % (ref 0.0–2.0)
Basophils Absolute: 0 10*3/uL (ref 0.0–0.1)
EOS%: 0 % (ref 0.0–7.0)
Eosinophils Absolute: 0 10*3/uL (ref 0.0–0.5)
HCT: 20.8 % — ABNORMAL LOW (ref 34.8–46.6)
HGB: 7.1 g/dL — ABNORMAL LOW (ref 11.6–15.9)
LYMPH#: 1.2 10*3/uL (ref 0.9–3.3)
LYMPH%: 30.9 % (ref 14.0–49.7)
MCH: 28.3 pg (ref 25.1–34.0)
MCHC: 34.1 g/dL (ref 31.5–36.0)
MCV: 82.9 fL (ref 79.5–101.0)
MONO#: 0.3 10*3/uL (ref 0.1–0.9)
MONO%: 6.5 % (ref 0.0–14.0)
NEUT#: 2.4 10*3/uL (ref 1.5–6.5)
NEUT%: 61.8 % (ref 38.4–76.8)
NRBC: 1 % — AB (ref 0–0)
Platelets: 276 10*3/uL (ref 145–400)
RBC: 2.51 10*6/uL — ABNORMAL LOW (ref 3.70–5.45)
RDW: 14.3 % (ref 11.2–14.5)
WBC: 3.9 10*3/uL (ref 3.9–10.3)

## 2013-12-21 LAB — HOLD TUBE, BLOOD BANK

## 2013-12-21 LAB — PREPARE RBC (CROSSMATCH)

## 2013-12-21 LAB — TECHNOLOGIST REVIEW: Technologist Review: 4

## 2013-12-21 MED ORDER — SODIUM CHLORIDE 0.9 % IV SOLN
INTRAVENOUS | Status: DC
  Administered 2013-12-21: 13:00:00 via INTRAVENOUS

## 2013-12-21 MED ORDER — SODIUM CHLORIDE 0.9 % IJ SOLN
10.0000 mL | INTRAMUSCULAR | Status: AC | PRN
  Administered 2013-12-21: 10 mL
  Filled 2013-12-21: qty 10

## 2013-12-21 MED ORDER — HEPARIN SOD (PORK) LOCK FLUSH 100 UNIT/ML IV SOLN
500.0000 [IU] | Freq: Every day | INTRAVENOUS | Status: AC | PRN
  Administered 2013-12-21: 500 [IU]
  Filled 2013-12-21: qty 5

## 2013-12-21 NOTE — Telephone Encounter (Signed)
Patient called and left message to call her at home. I have called and left message for to call me back.  jmw

## 2013-12-21 NOTE — Telephone Encounter (Signed)
Patient called stating "feeling weak and can't breath" asking to come in today for labs instead of tomorrow. Reviewed with MD and patient to come in for labs, and possible transfusion. Patient informed.  Lab informed, add-on appt completed.  Infusion informed of possible transfusion today.

## 2013-12-21 NOTE — Patient Instructions (Signed)

## 2013-12-22 ENCOUNTER — Telehealth: Payer: Self-pay | Admitting: *Deleted

## 2013-12-22 ENCOUNTER — Other Ambulatory Visit: Payer: Medicare Other

## 2013-12-22 ENCOUNTER — Ambulatory Visit (HOSPITAL_BASED_OUTPATIENT_CLINIC_OR_DEPARTMENT_OTHER): Payer: Medicare Other

## 2013-12-22 VITALS — BP 150/46 | HR 79 | Temp 97.5°F

## 2013-12-22 DIAGNOSIS — D469 Myelodysplastic syndrome, unspecified: Secondary | ICD-10-CM

## 2013-12-22 DIAGNOSIS — D649 Anemia, unspecified: Secondary | ICD-10-CM

## 2013-12-22 LAB — TYPE AND SCREEN
ABO/RH(D): AB NEG
ANTIBODY SCREEN: NEGATIVE
Donor AG Type: NEGATIVE
Unit division: 0

## 2013-12-22 MED ORDER — DARBEPOETIN ALFA-POLYSORBATE 500 MCG/ML IJ SOLN
500.0000 ug | Freq: Once | INTRAMUSCULAR | Status: AC
  Administered 2013-12-22: 500 ug via SUBCUTANEOUS
  Filled 2013-12-22: qty 1

## 2013-12-22 NOTE — Telephone Encounter (Signed)
Lab called to clarify if pt needed labwork done again today.  Informed labs not needed today, was just done yesterday.  Pt does need aranesp and was seen by Injection RN for this.

## 2013-12-24 ENCOUNTER — Encounter: Payer: Self-pay | Admitting: Hematology and Oncology

## 2013-12-24 NOTE — Progress Notes (Signed)
Faxed form back to [facility]. The patient is no longer on Exjade as of May 2015,

## 2013-12-30 ENCOUNTER — Ambulatory Visit (INDEPENDENT_AMBULATORY_CARE_PROVIDER_SITE_OTHER): Payer: Medicare Other | Admitting: Family Medicine

## 2013-12-30 ENCOUNTER — Encounter: Payer: Self-pay | Admitting: Family Medicine

## 2013-12-30 VITALS — BP 142/70 | HR 84 | Temp 97.7°F | Wt 110.0 lb

## 2013-12-30 DIAGNOSIS — R600 Localized edema: Secondary | ICD-10-CM

## 2013-12-30 DIAGNOSIS — E1142 Type 2 diabetes mellitus with diabetic polyneuropathy: Secondary | ICD-10-CM

## 2013-12-30 DIAGNOSIS — Z23 Encounter for immunization: Secondary | ICD-10-CM

## 2013-12-30 DIAGNOSIS — E1165 Type 2 diabetes mellitus with hyperglycemia: Secondary | ICD-10-CM

## 2013-12-30 DIAGNOSIS — I10 Essential (primary) hypertension: Secondary | ICD-10-CM

## 2013-12-30 DIAGNOSIS — IMO0001 Reserved for inherently not codable concepts without codable children: Secondary | ICD-10-CM

## 2013-12-30 DIAGNOSIS — R609 Edema, unspecified: Secondary | ICD-10-CM

## 2013-12-30 DIAGNOSIS — E1149 Type 2 diabetes mellitus with other diabetic neurological complication: Secondary | ICD-10-CM

## 2013-12-30 DIAGNOSIS — IMO0002 Reserved for concepts with insufficient information to code with codable children: Secondary | ICD-10-CM

## 2013-12-30 LAB — BASIC METABOLIC PANEL
BUN: 24 mg/dL — AB (ref 6–23)
CALCIUM: 9.3 mg/dL (ref 8.4–10.5)
CO2: 31 mEq/L (ref 19–32)
Chloride: 98 mEq/L (ref 96–112)
Creatinine, Ser: 0.9 mg/dL (ref 0.4–1.2)
GFR: 62.27 mL/min (ref >60.00)
GLUCOSE: 177 mg/dL — AB (ref 70–99)
POTASSIUM: 5.1 meq/L (ref 3.5–5.1)
Sodium: 134 mEq/L — ABNORMAL LOW (ref 135–145)

## 2013-12-30 LAB — HEMOGLOBIN A1C: Hgb A1c MFr Bld: 7.6 % — ABNORMAL HIGH (ref 4.6–6.5)

## 2013-12-30 MED ORDER — GABAPENTIN 100 MG PO CAPS: ORAL_CAPSULE | ORAL | Status: DC

## 2013-12-30 NOTE — Progress Notes (Signed)
   Subjective:    Patient ID: Meredith Rodriguez, female    DOB: [birth year], 80 y.o.   MRN: [id number]  HPI Here for followup multiple issues. She has chronic problems including myelodysplastic syndrome, type 2 diabetes, diabetic polyneuropathy, dyslipidemia, hypertension.  Bilateral leg edema. Improved after reduction in dosage of amlodipine and low-dose furosemide. Her weight is down to 110 pounds which is near her baseline. No dizziness or orthostasis.  Type 2 diabetes. Last A1c 8.3%. Remains on Lantus 10 units once daily and as needed short-acting insulin with meals. No recent hypoglycemia. Does not monitor fasting sugars regularly. No symptoms of polyuria or polydipsia  History of diabetic neuropathy. Poor control with low-dose gabapentin. We titrated 200 mg 3 times a day- possibly some mild improvement. She is reluctant to do further with titration of medication. Patient still needs flu vaccine.  Past Medical History  Diagnosis Date  . Hypertension   . Diabetes mellitus   . Hyperkalemia   . IBS (irritable bowel syndrome)   . Hypercholesterolemia   . Cervical radiculopathy   . Anemia   . Cancer     leukemia  . Leukemia    Past Surgical History  Procedure Laterality Date  . Varicose veins      surgey 1959  . Tonsilectomy, adenoidectomy, bilateral myringotomy and tubes    . Esophagogastroduodenoscopy  04/01/2011    Procedure: ESOPHAGOGASTRODUODENOSCOPY (EGD);  Surgeon: Missy Sabins, MD;  Location: [facility] ENDOSCOPY;  Service: Endoscopy;  Laterality: N/A;    reports that she has never smoked. She has never used smokeless tobacco. She reports that she does not drink alcohol or use illicit drugs. family history includes Cancer in her daughter, sister, sister, and sister; Diabetes in her sister; Hypertension in her father; Stroke in her mother. Allergies  Allergen Reactions  . Amoxicillin [Amoxicillin]     Upset stomach  . Cephalexin Nausea Only  . Metformin And Related     Gi problems  .  Phenergan [Promethazine Hcl] Other (See Comments)    Unknown   . Procardia [Nifedipine]     dizziness         Review of Systems  Constitutional: Positive for fatigue. Negative for fever and chills.  Respiratory: Negative for cough and shortness of breath.   Cardiovascular: Negative for chest pain, palpitations and leg swelling.  Gastrointestinal: Negative for abdominal pain.  Genitourinary: Negative for dysuria.  Neurological: Negative for dizziness.       Objective:   Physical Exam  Constitutional: She appears well-developed and well-nourished.  Neck: Neck supple. No thyromegaly present.  Cardiovascular: Normal rate and regular rhythm.   Pulmonary/Chest: Effort normal and breath sounds normal. No respiratory distress. She has no wheezes. She has no rales.  Musculoskeletal: She exhibits no edema.  Neurological: She is alert.          Assessment & Plan:  #1 bilateral leg edema Improved as above. Recheck basic metabolic panel. Consider reducing furosemide-esp if BUN/Cr climbing. #2 type 2 diabetes. History of slightly suboptimal control. Not aiming for tight control (less than A1c 7) with her age and multiple comorbidities. Recheck A1c #3 diabetic neuropathy. We discussed possible titration further of gabapentin 300 mg 3 times a day but she is reluctant. Refill medication  #4 health maintenance. Flu vaccine given.

## 2013-12-30 NOTE — Progress Notes (Signed)
Pre visit review using our clinic review tool, if applicable. No additional management support is needed unless otherwise documented below in the visit note. 

## 2014-01-05 ENCOUNTER — Non-Acute Institutional Stay (HOSPITAL_COMMUNITY)
Admission: AD | Admit: 2014-01-05 | Discharge: 2014-01-05 | Disposition: A | Discharge status: Home / Self Care | Payer: Medicare Other | Source: Ambulatory Visit | Attending: Hematology and Oncology | Admitting: Hematology and Oncology

## 2014-01-05 ENCOUNTER — Ambulatory Visit (HOSPITAL_COMMUNITY)
Admission: RE | Admit: 2014-01-05 | Discharge: 2014-01-05 | Disposition: A | Discharge status: Home / Self Care | Payer: Medicare Other | Source: Ambulatory Visit | Attending: Oncology | Admitting: Oncology

## 2014-01-05 ENCOUNTER — Other Ambulatory Visit (HOSPITAL_BASED_OUTPATIENT_CLINIC_OR_DEPARTMENT_OTHER): Payer: Medicare Other

## 2014-01-05 ENCOUNTER — Other Ambulatory Visit: Payer: Self-pay | Admitting: Hematology and Oncology

## 2014-01-05 ENCOUNTER — Ambulatory Visit (HOSPITAL_BASED_OUTPATIENT_CLINIC_OR_DEPARTMENT_OTHER): Payer: Medicare Other

## 2014-01-05 VITALS — BP 143/48 | HR 84 | Temp 97.8°F

## 2014-01-05 DIAGNOSIS — D630 Anemia in neoplastic disease: Secondary | ICD-10-CM

## 2014-01-05 DIAGNOSIS — E785 Hyperlipidemia, unspecified: Secondary | ICD-10-CM | POA: Diagnosis not present

## 2014-01-05 DIAGNOSIS — D649 Anemia, unspecified: Secondary | ICD-10-CM

## 2014-01-05 DIAGNOSIS — Z880 Allergy status to penicillin: Secondary | ICD-10-CM | POA: Diagnosis not present

## 2014-01-05 DIAGNOSIS — D469 Myelodysplastic syndrome, unspecified: Secondary | ICD-10-CM

## 2014-01-05 DIAGNOSIS — I10 Essential (primary) hypertension: Secondary | ICD-10-CM | POA: Diagnosis not present

## 2014-01-05 DIAGNOSIS — E119 Type 2 diabetes mellitus without complications: Secondary | ICD-10-CM | POA: Insufficient documentation

## 2014-01-05 LAB — CBC WITH DIFFERENTIAL/PLATELET
BASO%: 0.6 % (ref 0.0–2.0)
BASOS ABS: 0 10*3/uL (ref 0.0–0.1)
EOS%: 0.3 % (ref 0.0–7.0)
Eosinophils Absolute: 0 10*3/uL (ref 0.0–0.5)
HCT: 20.1 % — ABNORMAL LOW (ref 34.8–46.6)
HEMOGLOBIN: 6.5 g/dL — AB (ref 11.6–15.9)
LYMPH%: 37 % (ref 14.0–49.7)
MCH: 28.1 pg (ref 25.1–34.0)
MCHC: 32.3 g/dL (ref 31.5–36.0)
MCV: 87 fL (ref 79.5–101.0)
MONO#: 0.5 10*3/uL (ref 0.1–0.9)
MONO%: 13.6 % (ref 0.0–14.0)
NEUT#: 1.6 10*3/uL (ref 1.5–6.5)
NEUT%: 48.5 % (ref 38.4–76.8)
Platelets: 156 10*3/uL (ref 145–400)
RBC: 2.31 10*6/uL — ABNORMAL LOW (ref 3.70–5.45)
RDW: 14.8 % — AB (ref 11.2–14.5)
WBC: 3.4 10*3/uL — ABNORMAL LOW (ref 3.9–10.3)
lymph#: 1.3 10*3/uL (ref 0.9–3.3)
nRBC: 1 % — ABNORMAL HIGH (ref 0–0)

## 2014-01-05 LAB — HOLD TUBE, BLOOD BANK

## 2014-01-05 LAB — PREPARE RBC (CROSSMATCH)

## 2014-01-05 LAB — TECHNOLOGIST REVIEW: Technologist Review: 3

## 2014-01-05 MED ORDER — SODIUM CHLORIDE 0.9 % IJ SOLN
10.0000 mL | INTRAMUSCULAR | Status: AC | PRN
  Administered 2014-01-05: 10 mL

## 2014-01-05 MED ORDER — DARBEPOETIN ALFA-POLYSORBATE 500 MCG/ML IJ SOLN
500.0000 ug | Freq: Once | INTRAMUSCULAR | Status: AC
  Administered 2014-01-05: 500 ug via SUBCUTANEOUS
  Filled 2014-01-05: qty 1

## 2014-01-05 MED ORDER — HEPARIN SOD (PORK) LOCK FLUSH 100 UNIT/ML IV SOLN
500.0000 [IU] | Freq: Every day | INTRAVENOUS | Status: AC | PRN
  Administered 2014-01-05: 500 [IU]

## 2014-01-05 MED ORDER — SODIUM CHLORIDE 0.9 % IJ SOLN: 3.0000 mL | INTRAMUSCULAR | Status: DC | PRN

## 2014-01-05 MED ORDER — SODIUM CHLORIDE 0.9 % IV SOLN
250.0000 mL | Freq: Once | INTRAVENOUS | Status: AC
  Administered 2014-01-05: 250 mL via INTRAVENOUS

## 2014-01-05 MED ORDER — HEPARIN SOD (PORK) LOCK FLUSH 100 UNIT/ML IV SOLN
250.0000 [IU] | INTRAVENOUS | Status: DC | PRN
Start: 2014-01-05 — End: 2014-01-06
  Filled 2014-01-05: qty 5

## 2014-01-05 NOTE — Procedures (Signed)
[facility]  Procedure Note  DENITRA DONAGHEY KZL:[id number] DOB: [birth year] DOA: 01/05/2014   Dr. 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

## 2014-01-05 NOTE — H&P (Signed)
She is here for transfusion only 

## 2014-01-05 NOTE — Progress Notes (Signed)
Evalyn here for Aranesp injection.  HGB 6.5 today.  Complaining of being tired and weak.  Will receive a blood transfusion at [facility] today. Orders entered in computer per Dr Alvy Bimler.

## 2014-01-06 LAB — TYPE AND SCREEN
ABO/RH(D): AB NEG
Antibody Screen: NEGATIVE
DONOR AG TYPE: NEGATIVE
UNIT DIVISION: 0

## 2014-01-10 ENCOUNTER — Other Ambulatory Visit: Payer: Self-pay | Admitting: Family Medicine

## 2014-01-19 ENCOUNTER — Ambulatory Visit (HOSPITAL_BASED_OUTPATIENT_CLINIC_OR_DEPARTMENT_OTHER): Payer: Medicare Other

## 2014-01-19 ENCOUNTER — Ambulatory Visit (HOSPITAL_BASED_OUTPATIENT_CLINIC_OR_DEPARTMENT_OTHER): Payer: Medicare Other | Admitting: Hematology and Oncology

## 2014-01-19 ENCOUNTER — Encounter: Payer: Self-pay | Admitting: Hematology and Oncology

## 2014-01-19 ENCOUNTER — Other Ambulatory Visit (HOSPITAL_BASED_OUTPATIENT_CLINIC_OR_DEPARTMENT_OTHER): Payer: Medicare Other

## 2014-01-19 VITALS — BP 146/47 | HR 85 | Temp 97.9°F | Resp 18 | Ht 66.0 in | Wt 108.4 lb

## 2014-01-19 VITALS — BP 149/45 | HR 77 | Temp 98.2°F | Resp 18

## 2014-01-19 DIAGNOSIS — D630 Anemia in neoplastic disease: Secondary | ICD-10-CM

## 2014-01-19 DIAGNOSIS — D469 Myelodysplastic syndrome, unspecified: Secondary | ICD-10-CM

## 2014-01-19 LAB — CBC WITH DIFFERENTIAL/PLATELET
BASO%: 0.5 % (ref 0.0–2.0)
Basophils Absolute: 0 10e3/uL (ref 0.0–0.1)
EOS%: 0 % (ref 0.0–7.0)
Eosinophils Absolute: 0 10e3/uL (ref 0.0–0.5)
HCT: 21.9 % — ABNORMAL LOW (ref 34.8–46.6)
HGB: 7.1 g/dL — ABNORMAL LOW (ref 11.6–15.9)
LYMPH%: 21.8 % (ref 14.0–49.7)
MCH: 28.4 pg (ref 25.1–34.0)
MCHC: 32.4 g/dL (ref 31.5–36.0)
MCV: 87.6 fL (ref 79.5–101.0)
MONO#: 0.4 10e3/uL (ref 0.1–0.9)
MONO%: 9.2 % (ref 0.0–14.0)
NEUT#: 3 10e3/uL (ref 1.5–6.5)
NEUT%: 68.5 % (ref 38.4–76.8)
Platelets: 258 10e3/uL (ref 145–400)
RBC: 2.5 10e6/uL — ABNORMAL LOW (ref 3.70–5.45)
RDW: 14.9 % — ABNORMAL HIGH (ref 11.2–14.5)
WBC: 4.4 10e3/uL (ref 3.9–10.3)
lymph#: 1 10e3/uL (ref 0.9–3.3)
nRBC: 1 % — ABNORMAL HIGH (ref 0–0)

## 2014-01-19 LAB — PREPARE RBC (CROSSMATCH)

## 2014-01-19 LAB — TECHNOLOGIST REVIEW

## 2014-01-19 LAB — HOLD TUBE, BLOOD BANK

## 2014-01-19 MED ORDER — SODIUM CHLORIDE 0.9 % IJ SOLN
10.0000 mL | INTRAMUSCULAR | Status: AC | PRN
  Administered 2014-01-19: 10 mL
  Filled 2014-01-19: qty 10

## 2014-01-19 MED ORDER — HEPARIN SOD (PORK) LOCK FLUSH 100 UNIT/ML IV SOLN
500.0000 [IU] | Freq: Every day | INTRAVENOUS | Status: AC | PRN
  Administered 2014-01-19: 500 [IU]
  Filled 2014-01-19: qty 5

## 2014-01-19 MED ORDER — DARBEPOETIN ALFA-POLYSORBATE 500 MCG/ML IJ SOLN
500.0000 ug | Freq: Once | INTRAMUSCULAR | Status: AC
  Administered 2014-01-19: 500 ug via SUBCUTANEOUS
  Filled 2014-01-19: qty 1

## 2014-01-19 MED ORDER — SODIUM CHLORIDE 0.9 % IV SOLN
250.0000 mL | Freq: Once | INTRAVENOUS | Status: AC
  Administered 2014-01-19: 250 mL via INTRAVENOUS

## 2014-01-19 NOTE — Assessment & Plan Note (Signed)
I had a long discussion with the patient and her daughter. In terms of supportive care, I recommend transfusion and erythropoietin stimulating agents with darbepoetin. I recommend we increase the dose of darbopoeitin to 500 mcg every 2 weeks for hemoglobin less than 10 g. I also recommend lowering her blood transfusion threshold to 7 g and only transfuse 1 unit of blood each time. The patient wants to get away from premedication if possible because Benadryl will make her sleepy. Her blood count is not improving. I recommend consideration for Revlimid, danazol, addition of Granix. The risks, benefits, side effects of the treatment options were fully discussed with her and her son. Patient education handout were given.

## 2014-01-19 NOTE — Patient Instructions (Signed)

## 2014-01-19 NOTE — Progress Notes (Signed)
[facility] OFFICE PROGRESS NOTE  Patient Care Team: [name], MD as PCP - General  SUMMARY OF ONCOLOGIC HISTORY: Summary of her history is as follows: #30 March 2011, she was noted to have anemia with associated thrombocytosis. She became transfusion dependent and was referred to see a hematologist here for further evaluation. The patient has lack of reticulocytosis with associated elevated erythropoietin level of 197. #2 on 05/28/2011, bone marrow aspirate and biopsy was hypercellular with increased myelofibrosis. There were no evidence of increased blasts. #3 The patient was started on erythropoietin stimulating agent with Procrit injections 40,000 units every 2 weeks beginning of 11/20/2011 along with transfusion support. By October 2013, Procrit was deemed ineffective. She was subsequently recommended to consider chemotherapy. #4 On 02/19/2012, repeat bone marrow aspirate and biopsy show persistent myelofibrosis and hypercellular marrow. It was difficult to estimate percentage of blastic cells to do sampling error. By flow cytometry, 10% of abnormal cells were detected. #5 From 02/24/2012 to 05/08/2012, she received 3 cycles of decitabine. The last cycle of treatment was complicated by febrile neutropenia and pneumonia requiring hospitalization. Decision was made to discontinue chemotherapy and to return to clinic for transfusion support only. #6 Due to severe iron overload, she was started on Exjade around September 2014 to May 2015. #7 On 09/09/13: She had repeat bone marrow aspirate and biopsy which showed persistent myelodysplastic syndrome. Aranesp was restarted.   INTERVAL HISTORY: Please see below for problem oriented charting. She complained of fatigue. The patient denies any recent signs or symptoms of bleeding such as spontaneous epistaxis, hematuria or hematochezia.   REVIEW OF SYSTEMS:   Constitutional: Denies fevers, chills or abnormal weight  loss Eyes: Denies blurriness of vision Ears, nose, mouth, throat, and face: Denies mucositis or sore throat Respiratory: Denies cough, dyspnea or wheezes Cardiovascular: Denies palpitation, chest discomfort or lower extremity swelling Gastrointestinal:  Denies nausea, heartburn or change in bowel habits Skin: Denies abnormal skin rashes Lymphatics: Denies new lymphadenopathy or easy bruising Neurological:Denies numbness, tingling or new weaknesses Behavioral/Psych: Mood is stable, no new changes  All other systems were reviewed with the patient and are negative.  I have reviewed the past medical history, past surgical history, social history and family history with the patient and they are unchanged from previous note.  ALLERGIES:  is allergic to amoxicillin; cephalexin; metformin and related; phenergan; and procardia.  MEDICATIONS:  Current Outpatient Prescriptions  Medication Sig Dispense Refill  . amLODipine (NORVASC) 5 MG tablet Take 1 tablet (5 mg total) by mouth daily.  30 tablet  5  . aspirin 81 MG tablet Take 81 mg by mouth daily.      . B-D ULTRAFINE III SHORT PEN 31G X 8 MM MISC USE WITH LANTUS PEN DAILY AS INSTRUCTED  100 each  2  . cholecalciferol (VITAMIN D) 1000 UNITS tablet Take 1,000 Units by mouth daily.      . diclofenac (VOLTAREN) 75 MG EC tablet       . furosemide (LASIX) 20 MG tablet Take 1 tablet (20 mg total) by mouth daily.  30 tablet  5  . gabapentin (NEURONTIN) 100 MG capsule Take two tablets three times daily  180 capsule  11  . insulin glargine (LANTUS) 100 UNIT/ML injection Inject 10 Units into the skin at bedtime.      . insulin lispro (HUMALOG KWIKPEN) 100 UNIT/ML SOPN Inject 4 Units into the skin 2 (two) times daily with a meal. Inject 4 units twice daily into skin if  blood glucose is over 100 at lunch and dinner      . lidocaine-prilocaine (EMLA) cream Apply 1 application topically as needed (to access port).      . metoprolol (LOPRESSOR) 50 MG tablet  Take 1 tablet (50 mg total) by mouth 2 (two) times daily.  180 tablet  3  . Omega-3 Fatty Acids (FISH OIL) 1000 MG CAPS Take 1,000 mg by mouth daily.       . potassium chloride (KLOR-CON 10) 10 MEQ tablet Take 1 tablet (10 mEq total) by mouth daily.  30 tablet  5  . simvastatin (ZOCOR) 5 MG tablet Take 5 mg by mouth at bedtime.      . [DISCONTINUED] glimepiride (AMARYL) 2 MG tablet Take 1 tablet (2 mg total) by mouth every morning.  30 tablet  11   Current Facility-Administered Medications  Medication Dose Route Frequency Provider Last Rate Last Dose  . TDaP (BOOSTRIX) injection 0.5 mL  0.5 mL Intramuscular Once [name], MD        PHYSICAL EXAMINATION: ECOG PERFORMANCE STATUS: 1 - Symptomatic but completely ambulatory  Filed Vitals:   01/19/14 0924  BP: 146/47  Pulse: 85  Temp: 97.9 F (36.6 C)  Resp: 18   Filed Weights   01/19/14 0924  Weight: 108 lb 6.4 oz (49.17 kg)    GENERAL:alert, no distress and comfortable. She looks thin SKIN: skin color is pale, texture, turgor are normal, no rashes or significant lesions EYES: normal, Conjunctiva are pale and non-injected, sclera clear OROPHARYNX:no exudate, no erythema and lips, buccal mucosa, and tongue normal  NECK: supple, thyroid normal size, non-tender, without nodularity LYMPH:  no palpable lymphadenopathy in the cervical, axillary or inguinal LUNGS: clear to auscultation and percussion with normal breathing effort HEART: regular rate & rhythm and no murmurs and no lower extremity edema ABDOMEN:abdomen soft, non-tender and normal bowel sounds Musculoskeletal:no cyanosis of digits and no clubbing  NEURO: alert & oriented x 3 with fluent speech, no focal motor/sensory deficits  LABORATORY DATA:  I have reviewed the data as listed    Component Value Date/Time   NA 134* 12/30/2013 0902   NA 141 09/29/2013 0830   K 5.1 12/30/2013 0902   K 4.2 09/29/2013 0830   CL 98 12/30/2013 0902   CL 99 06/01/2012 1028   CO2 31 12/30/2013  0902   CO2 24 09/29/2013 0830   GLUCOSE 177* 12/30/2013 0902   GLUCOSE 172* 09/29/2013 0830   GLUCOSE 230* 06/01/2012 1028   BUN 24* 12/30/2013 0902   BUN 26.6* 09/29/2013 0830   CREATININE 0.9 12/30/2013 0902   CREATININE 1.4* 09/29/2013 0830   CREATININE 0.69 07/18/2010 1202   CALCIUM 9.3 12/30/2013 0902   CALCIUM 9.7 09/29/2013 0830   PROT 7.0 12/05/2013 1808   PROT 7.3 09/29/2013 0830   ALBUMIN 3.6 12/05/2013 1808   ALBUMIN 3.5 09/29/2013 0830   AST 29 12/05/2013 1808   AST 19 09/29/2013 0830   ALT 26 12/05/2013 1808   ALT 17 09/29/2013 0830   ALKPHOS 99 12/05/2013 1808   ALKPHOS 84 09/29/2013 0830   BILITOT 0.5 12/05/2013 1808   BILITOT 0.62 09/29/2013 0830   GFRNONAA 62* 12/05/2013 1808   GFRAA 72* 12/05/2013 1808    No results found for this basename: SPEP,  UPEP,   kappa and lambda light chains    Lab Results  Component Value Date   WBC 4.4 01/19/2014   NEUTROABS 3.0 01/19/2014   HGB 7.1* 01/19/2014   HCT  21.9* 01/19/2014   MCV 87.6 01/19/2014   PLT 258 01/19/2014      Chemistry      Component Value Date/Time   NA 134* 12/30/2013 0902   NA 141 09/29/2013 0830   K 5.1 12/30/2013 0902   K 4.2 09/29/2013 0830   CL 98 12/30/2013 0902   CL 99 06/01/2012 1028   CO2 31 12/30/2013 0902   CO2 24 09/29/2013 0830   BUN 24* 12/30/2013 0902   BUN 26.6* 09/29/2013 0830   CREATININE 0.9 12/30/2013 0902   CREATININE 1.4* 09/29/2013 0830   CREATININE 0.69 07/18/2010 1202   GLU 215* 02/26/2012 1255      Component Value Date/Time   CALCIUM 9.3 12/30/2013 0902   CALCIUM 9.7 09/29/2013 0830   ALKPHOS 99 12/05/2013 1808   ALKPHOS 84 09/29/2013 0830   AST 29 12/05/2013 1808   AST 19 09/29/2013 0830   ALT 26 12/05/2013 1808   ALT 17 09/29/2013 0830   BILITOT 0.5 12/05/2013 1808   BILITOT 0.62 09/29/2013 0830      ASSESSMENT & PLAN:  Myelodysplastic syndrome I had a long discussion with the patient and her daughter. In terms of supportive care, I recommend transfusion and erythropoietin stimulating agents with darbepoetin. I recommend we increase the dose  of darbopoeitin to 500 mcg every 2 weeks for hemoglobin less than 10 g. I also recommend lowering her blood transfusion threshold to 7 g and only transfuse 1 unit of blood each time. The patient wants to get away from premedication if possible because Benadryl will make her sleepy. Her blood count is not improving. I recommend consideration for Revlimid, danazol, addition of Granix. The risks, benefits, side effects of the treatment options were fully discussed with her and her son. Patient education handout were given.      Anemia in neoplastic disease We discussed some of the risks, benefits, and alternatives of blood transfusions. The patient is symptomatic from anemia and the hemoglobin level is critically low.  Some of the side-effects to be expected including risks of transfusion reactions, chills, infection, syndrome of volume overload and risk of hospitalization from various reasons and the patient is willing to proceed.    No orders of the defined types were placed in this encounter.   All questions were answered. The patient knows to call the clinic with any problems, questions or concerns. No barriers to learning was detected. I spent 30 minutes counseling the patient face to face. The total time spent in the appointment was 40 minutes and more than 50% was on counseling and review of test results     [facility], [place], MD 01/19/2014 3:59 PM

## 2014-01-19 NOTE — Assessment & Plan Note (Signed)
We discussed some of the risks, benefits, and alternatives of blood transfusions. The patient is symptomatic from anemia and the hemoglobin level is critically low.  Some of the side-effects to be expected including risks of transfusion reactions, chills, infection, syndrome of volume overload and risk of hospitalization from various reasons and the patient is willing to proceed.

## 2014-01-20 ENCOUNTER — Telehealth: Payer: Self-pay | Admitting: Hematology and Oncology

## 2014-01-20 LAB — TYPE AND SCREEN
ABO/RH(D): AB NEG
Antibody Screen: NEGATIVE
DONOR AG TYPE: NEGATIVE
Unit division: 0

## 2014-01-20 NOTE — Telephone Encounter (Signed)
lvm for pt regarding to Sept thru NOV appt....advised pt to pick up new sched

## 2014-01-24 ENCOUNTER — Other Ambulatory Visit: Payer: Self-pay | Admitting: Hematology and Oncology

## 2014-01-24 ENCOUNTER — Telehealth: Payer: Self-pay | Admitting: *Deleted

## 2014-01-24 NOTE — Telephone Encounter (Signed)
Daughter called to say that patient has decided to take the injection- filagrastim

## 2014-01-26 ENCOUNTER — Other Ambulatory Visit (HOSPITAL_BASED_OUTPATIENT_CLINIC_OR_DEPARTMENT_OTHER): Payer: Medicare Other

## 2014-01-26 ENCOUNTER — Telehealth: Payer: Self-pay | Admitting: *Deleted

## 2014-01-26 DIAGNOSIS — D630 Anemia in neoplastic disease: Secondary | ICD-10-CM

## 2014-01-26 DIAGNOSIS — D469 Myelodysplastic syndrome, unspecified: Secondary | ICD-10-CM

## 2014-01-26 LAB — CBC WITH DIFFERENTIAL/PLATELET
BASO%: 0.8 % (ref 0.0–2.0)
Basophils Absolute: 0 10*3/uL (ref 0.0–0.1)
EOS%: 0.3 % (ref 0.0–7.0)
Eosinophils Absolute: 0 10*3/uL (ref 0.0–0.5)
HCT: 22.3 % — ABNORMAL LOW (ref 34.8–46.6)
HGB: 7.2 g/dL — ABNORMAL LOW (ref 11.6–15.9)
LYMPH%: 30.8 % (ref 14.0–49.7)
MCH: 28.1 pg (ref 25.1–34.0)
MCHC: 32.3 g/dL (ref 31.5–36.0)
MCV: 87.1 fL (ref 79.5–101.0)
MONO#: 0.2 10*3/uL (ref 0.1–0.9)
MONO%: 5.2 % (ref 0.0–14.0)
NEUT#: 2.3 10*3/uL (ref 1.5–6.5)
NEUT%: 62.9 % (ref 38.4–76.8)
RBC: 2.56 10*6/uL — AB (ref 3.70–5.45)
RDW: 15 % — AB (ref 11.2–14.5)
WBC: 3.6 10*3/uL — AB (ref 3.9–10.3)
lymph#: 1.1 10*3/uL (ref 0.9–3.3)
nRBC: 1 % — ABNORMAL HIGH (ref 0–0)

## 2014-01-26 LAB — TECHNOLOGIST REVIEW

## 2014-01-26 LAB — HOLD TUBE, BLOOD BANK

## 2014-01-26 NOTE — Telephone Encounter (Signed)
Spoke w/ pt in lobby this morning. Informed her Hgb above 7 and no need for transfusion per Dr. Alvy Bimler. Pt to return next week for lab and injection as scheduled.  She states she feels tired but no worse than usual.  Denies any sob or chest pains.  No dizziness.  Instructed pt to call us if her symptoms worsen before next appt.  She verbalized understanding.

## 2014-01-27 ENCOUNTER — Other Ambulatory Visit: Payer: Self-pay | Admitting: Family Medicine

## 2014-02-02 ENCOUNTER — Ambulatory Visit (HOSPITAL_BASED_OUTPATIENT_CLINIC_OR_DEPARTMENT_OTHER): Payer: Medicare Other

## 2014-02-02 ENCOUNTER — Other Ambulatory Visit (HOSPITAL_BASED_OUTPATIENT_CLINIC_OR_DEPARTMENT_OTHER): Payer: Medicare Other

## 2014-02-02 ENCOUNTER — Non-Acute Institutional Stay (HOSPITAL_COMMUNITY)
Admission: AD | Admit: 2014-02-02 | Discharge: 2014-02-02 | Disposition: A | Discharge status: Home / Self Care | Payer: Medicare Other | Source: Ambulatory Visit | Attending: Hematology and Oncology | Admitting: Hematology and Oncology

## 2014-02-02 ENCOUNTER — Ambulatory Visit (HOSPITAL_COMMUNITY)
Admission: RE | Admit: 2014-02-02 | Discharge: 2014-02-02 | Disposition: A | Discharge status: Home / Self Care | Payer: Medicare Other | Source: Ambulatory Visit | Attending: Oncology | Admitting: Oncology

## 2014-02-02 VITALS — BP 138/39 | HR 79 | Temp 97.7°F

## 2014-02-02 DIAGNOSIS — D630 Anemia in neoplastic disease: Secondary | ICD-10-CM | POA: Diagnosis not present

## 2014-02-02 DIAGNOSIS — D469 Myelodysplastic syndrome, unspecified: Secondary | ICD-10-CM

## 2014-02-02 LAB — CBC WITH DIFFERENTIAL/PLATELET
BASO%: 0.8 % (ref 0.0–2.0)
Basophils Absolute: 0 10*3/uL (ref 0.0–0.1)
EOS%: 0.3 % (ref 0.0–7.0)
Eosinophils Absolute: 0 10*3/uL (ref 0.0–0.5)
HCT: 18.9 % — ABNORMAL LOW (ref 34.8–46.6)
HGB: 6.1 g/dL — CL (ref 11.6–15.9)
LYMPH%: 27.4 % (ref 14.0–49.7)
MCH: 27.7 pg (ref 25.1–34.0)
MCHC: 32.3 g/dL (ref 31.5–36.0)
MCV: 85.9 fL (ref 79.5–101.0)
MONO#: 0.5 10*3/uL (ref 0.1–0.9)
MONO%: 13.8 % (ref 0.0–14.0)
NEUT#: 2.2 10*3/uL (ref 1.5–6.5)
NEUT%: 57.7 % (ref 38.4–76.8)
NRBC: 1 % — AB (ref 0–0)
PLATELETS: 262 10*3/uL (ref 145–400)
RBC: 2.2 10*6/uL — ABNORMAL LOW (ref 3.70–5.45)
RDW: 15.2 % — ABNORMAL HIGH (ref 11.2–14.5)
WBC: 3.8 10*3/uL — ABNORMAL LOW (ref 3.9–10.3)
lymph#: 1 10*3/uL (ref 0.9–3.3)

## 2014-02-02 LAB — TECHNOLOGIST REVIEW

## 2014-02-02 LAB — HOLD TUBE, BLOOD BANK

## 2014-02-02 LAB — PREPARE RBC (CROSSMATCH)

## 2014-02-02 MED ORDER — SODIUM CHLORIDE 0.9 % IJ SOLN
10.0000 mL | INTRAMUSCULAR | Status: AC | PRN
  Administered 2014-02-02: 10 mL

## 2014-02-02 MED ORDER — HEPARIN SOD (PORK) LOCK FLUSH 100 UNIT/ML IV SOLN
500.0000 [IU] | Freq: Every day | INTRAVENOUS | Status: AC | PRN
  Administered 2014-02-02: 500 [IU]
  Filled 2014-02-02: qty 5

## 2014-02-02 MED ORDER — TBO-FILGRASTIM 300 MCG/0.5ML ~~LOC~~ SOSY
300.0000 ug | PREFILLED_SYRINGE | Freq: Once | SUBCUTANEOUS | Status: AC
  Administered 2014-02-02: 300 ug via SUBCUTANEOUS
  Filled 2014-02-02: qty 0.5

## 2014-02-02 MED ORDER — DARBEPOETIN ALFA-POLYSORBATE 500 MCG/ML IJ SOLN
500.0000 ug | Freq: Once | INTRAMUSCULAR | Status: AC
  Administered 2014-02-02: 500 ug via SUBCUTANEOUS
  Filled 2014-02-02: qty 1

## 2014-02-02 MED ORDER — SODIUM CHLORIDE 0.9 % IV SOLN
250.0000 mL | Freq: Once | INTRAVENOUS | Status: AC
  Administered 2014-02-02: 500 mL via INTRAVENOUS

## 2014-02-02 MED ORDER — HEPARIN SOD (PORK) LOCK FLUSH 100 UNIT/ML IV SOLN: 250.0000 [IU] | INTRAVENOUS | Status: DC | PRN

## 2014-02-02 MED ORDER — SODIUM CHLORIDE 0.9 % IJ SOLN: 3.0000 mL | INTRAMUSCULAR | Status: DC | PRN

## 2014-02-02 MED ORDER — FILGRASTIM 300 MCG/0.5ML IJ SOLN: 300.0000 ug | Freq: Once | INTRAMUSCULAR | Status: DC

## 2014-02-02 NOTE — H&P (Signed)
She is here for blood transfusion only

## 2014-02-02 NOTE — Progress Notes (Signed)
Received call back from [facility], stating order clarified to give 2 Units PRBC at this time. Informed patient of MD order to give 2 Units has been clarified and to continue. Patient acknowledges and agrees to transfusion.

## 2014-02-02 NOTE — Discharge Instructions (Signed)
Anemia, Nonspecific Anemia is a condition in which the concentration of red blood cells or hemoglobin in the blood is below normal. Hemoglobin is a substance in red blood cells that carries oxygen to the tissues of the body. Anemia results in not enough oxygen reaching these tissues.  CAUSES  Common causes of anemia include:   Excessive bleeding. Bleeding may be internal or external. This includes excessive bleeding from periods (in women) or from the intestine.   Poor nutrition.   Chronic kidney, thyroid, and liver disease.  Bone marrow disorders that decrease red blood cell production.  Cancer and treatments for cancer.  HIV, AIDS, and their treatments.  Spleen problems that increase red blood cell destruction.  Blood disorders.  Excess destruction of red blood cells due to infection, medicines, and autoimmune disorders. SIGNS AND SYMPTOMS   Minor weakness.   Dizziness.   Headache.  Palpitations.   Shortness of breath, especially with exercise.   Paleness.  Cold sensitivity.  Indigestion.  Nausea.  Difficulty sleeping.  Difficulty concentrating. Symptoms may occur suddenly or they may develop slowly.  DIAGNOSIS  Additional blood tests are often needed. These help your health care provider determine the best treatment. Your health care provider will check your stool for blood and look for other causes of blood loss.  TREATMENT  Treatment varies depending on the cause of the anemia. Treatment can include:   Supplements of iron, vitamin B12, or folic acid.   Hormone medicines.   A blood transfusion. This may be needed if blood loss is severe.   Hospitalization. This may be needed if there is significant continual blood loss.   Dietary changes.  Spleen removal. HOME CARE INSTRUCTIONS Keep all follow-up appointments. It often takes many weeks to correct anemia, and having your health care provider check on your condition and your response to  treatment is very important. SEEK IMMEDIATE MEDICAL CARE IF:   You develop extreme weakness, shortness of breath, or chest pain.   You become dizzy or have trouble concentrating.  You develop heavy vaginal bleeding.   You develop a rash.   You have bloody or black, tarry stools.   You faint.   You vomit up blood.   You vomit repeatedly.   You have abdominal pain.  You have a fever or persistent symptoms for more than 2-3 days.   You have a fever and your symptoms suddenly get worse.   You are dehydrated.  MAKE SURE YOU:  Understand these instructions.  Will watch your condition.  Will get help right away if you are not doing well or get worse. Document Released: 05/23/2004 Document Revised: 12/16/2012 Document Reviewed: 10/09/2012 Exit[employer] Patient Information 2015 [employer]. This information is not intended to replace advice given to you by your health care provider. Make sure you discuss any questions you have with your health care provider.  

## 2014-02-02 NOTE — Progress Notes (Addendum)
Patient states she thought she was only going to get 1 Unit PRBC due to high iron levels. Notified patient that 2 Units PRBC is ordered to be transfused at this time. This RN called Dr. Calton Dach office to clarify order. Per [place] RN, she is to clarify with Dr. Alvy Bimler and return call back to [place] prior to 2nd Stacy Unit transfusion.

## 2014-02-02 NOTE — Procedures (Signed)
[facility]  Procedure Note  Meredith Rodriguez PFX:[id number] DOB: January 14, 1926 DOA: 02/02/2014   PCP: Dr. 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 No complaints. VSS    Condition at Discharge: Tolerated Well. No complaints. VSS. Patient in no apparent distress. Instructed patient to seek medical attention if any changes in condition, patient acknowledges. Patient discharged home. Patient left day hospital with belongings ambulatory escorted by her husband.    Wendie Simmer, RN  [facility]

## 2014-02-03 LAB — TYPE AND SCREEN
ABO/RH(D): AB NEG
Antibody Screen: NEGATIVE
DONOR AG TYPE: NEGATIVE
Donor AG Type: NEGATIVE
Unit division: 0
Unit division: 0

## 2014-02-09 ENCOUNTER — Other Ambulatory Visit (HOSPITAL_BASED_OUTPATIENT_CLINIC_OR_DEPARTMENT_OTHER): Payer: Medicare Other

## 2014-02-09 DIAGNOSIS — D469 Myelodysplastic syndrome, unspecified: Secondary | ICD-10-CM

## 2014-02-09 LAB — CBC WITH DIFFERENTIAL/PLATELET
BASO%: 0.9 % (ref 0.0–2.0)
Basophils Absolute: 0 10*3/uL (ref 0.0–0.1)
EOS ABS: 0 10*3/uL (ref 0.0–0.5)
EOS%: 0 % (ref 0.0–7.0)
HCT: 29.8 % — ABNORMAL LOW (ref 34.8–46.6)
HGB: 9.7 g/dL — ABNORMAL LOW (ref 11.6–15.9)
LYMPH%: 39.5 % (ref 14.0–49.7)
MCH: 28.8 pg (ref 25.1–34.0)
MCHC: 32.6 g/dL (ref 31.5–36.0)
MCV: 88.4 fL (ref 79.5–101.0)
MONO#: 0.6 10*3/uL (ref 0.1–0.9)
MONO%: 16.8 % — AB (ref 0.0–14.0)
NEUT#: 1.5 10*3/uL (ref 1.5–6.5)
NEUT%: 42.8 % (ref 38.4–76.8)
PLATELETS: 232 10*3/uL (ref 145–400)
RBC: 3.37 10*6/uL — AB (ref 3.70–5.45)
RDW: 15.3 % — AB (ref 11.2–14.5)
WBC: 3.4 10*3/uL — ABNORMAL LOW (ref 3.9–10.3)
lymph#: 1.3 10*3/uL (ref 0.9–3.3)
nRBC: 2 % — ABNORMAL HIGH (ref 0–0)

## 2014-02-09 LAB — TECHNOLOGIST REVIEW

## 2014-02-09 LAB — HOLD TUBE, BLOOD BANK

## 2014-02-16 ENCOUNTER — Ambulatory Visit (HOSPITAL_BASED_OUTPATIENT_CLINIC_OR_DEPARTMENT_OTHER): Payer: Medicare Other

## 2014-02-16 ENCOUNTER — Other Ambulatory Visit (HOSPITAL_BASED_OUTPATIENT_CLINIC_OR_DEPARTMENT_OTHER): Payer: Medicare Other

## 2014-02-16 VITALS — BP 140/49 | HR 82 | Temp 98.0°F

## 2014-02-16 DIAGNOSIS — Z5189 Encounter for other specified aftercare: Secondary | ICD-10-CM

## 2014-02-16 DIAGNOSIS — D630 Anemia in neoplastic disease: Secondary | ICD-10-CM

## 2014-02-16 DIAGNOSIS — D469 Myelodysplastic syndrome, unspecified: Secondary | ICD-10-CM

## 2014-02-16 LAB — CBC WITH DIFFERENTIAL/PLATELET
BASO%: 0.9 % (ref 0.0–2.0)
BASOS ABS: 0 10*3/uL (ref 0.0–0.1)
EOS ABS: 0 10*3/uL (ref 0.0–0.5)
EOS%: 0 % (ref 0.0–7.0)
HCT: 25.5 % — ABNORMAL LOW (ref 34.8–46.6)
HEMOGLOBIN: 8.2 g/dL — AB (ref 11.6–15.9)
LYMPH%: 39.8 % (ref 14.0–49.7)
MCH: 28.4 pg (ref 25.1–34.0)
MCHC: 32.2 g/dL (ref 31.5–36.0)
MCV: 88.2 fL (ref 79.5–101.0)
MONO#: 0.2 10*3/uL (ref 0.1–0.9)
MONO%: 5.4 % (ref 0.0–14.0)
NEUT%: 53.9 % (ref 38.4–76.8)
NEUTROS ABS: 1.9 10*3/uL (ref 1.5–6.5)
PLATELETS: 252 10*3/uL (ref 145–400)
RBC: 2.89 10*6/uL — AB (ref 3.70–5.45)
RDW: 15.3 % — AB (ref 11.2–14.5)
WBC: 3.5 10*3/uL — ABNORMAL LOW (ref 3.9–10.3)
lymph#: 1.4 10*3/uL (ref 0.9–3.3)
nRBC: 2 % — ABNORMAL HIGH (ref 0–0)

## 2014-02-16 LAB — HOLD TUBE, BLOOD BANK

## 2014-02-16 MED ORDER — TBO-FILGRASTIM 300 MCG/0.5ML ~~LOC~~ SOSY
300.0000 ug | PREFILLED_SYRINGE | Freq: Once | SUBCUTANEOUS | Status: AC
  Administered 2014-02-16: 300 ug via SUBCUTANEOUS
  Filled 2014-02-16: qty 0.5

## 2014-02-16 MED ORDER — DARBEPOETIN ALFA-POLYSORBATE 500 MCG/ML IJ SOLN
500.0000 ug | Freq: Once | INTRAMUSCULAR | Status: AC
  Administered 2014-02-16: 500 ug via SUBCUTANEOUS
  Filled 2014-02-16: qty 1

## 2014-02-16 MED ORDER — FILGRASTIM 300 MCG/0.5ML IJ SOLN
300.0000 ug | Freq: Once | INTRAMUSCULAR | Status: DC
  Filled 2014-02-16: qty 0.5

## 2014-02-16 NOTE — Patient Instructions (Signed)
Darbepoetin Alfa injection What is this medicine? DARBEPOETIN ALFA (dar be POE e tin AL fa) helps your body make more red blood cells. It is used to treat anemia caused by chronic kidney failure and chemotherapy. This medicine may be used for other purposes; ask your health care provider or pharmacist if you have questions. COMMON BRAND NAME(S): Aranesp What should I tell my health care provider before I take this medicine? They need to know if you have any of these conditions: -blood clotting disorders or history of blood clots -cancer patient not on chemotherapy -cystic fibrosis -heart disease, such as angina, heart failure, or a history of a heart attack -hemoglobin level of 12 g/dL or greater -high blood pressure -low levels of folate, iron, or vitamin B12 -seizures -an unusual or allergic reaction to darbepoetin, erythropoietin, albumin, hamster proteins, latex, other medicines, foods, dyes, or preservatives -pregnant or trying to get pregnant -breast-feeding How should I use this medicine? This medicine is for injection into a vein or under the skin. It is usually given by a health care professional in a hospital or clinic setting. If you get this medicine at home, you will be taught how to prepare and give this medicine. Do not shake the solution before you withdraw a dose. Use exactly as directed. Take your medicine at regular intervals. Do not take your medicine more often than directed. It is important that you put your used needles and syringes in a special sharps container. Do not put them in a trash can. If you do not have a sharps container, call your pharmacist or healthcare provider to get one. Talk to your pediatrician regarding the use of this medicine in children. While this medicine may be used in children as young as 1 year for selected conditions, precautions do apply. Overdosage: If you think you have taken too much of this medicine contact a poison control center or  emergency room at once. NOTE: This medicine is only for you. Do not share this medicine with others. What if I miss a dose? If you miss a dose, take it as soon as you can. If it is almost time for your next dose, take only that dose. Do not take double or extra doses. What may interact with this medicine? Do not take this medicine with any of the following medications: -epoetin alfa This list may not describe all possible interactions. Give your health care provider a list of all the medicines, herbs, non-prescription drugs, or dietary supplements you use. Also tell them if you smoke, drink alcohol, or use illegal drugs. Some items may interact with your medicine. What should I watch for while using this medicine? Visit your prescriber or health care professional for regular checks on your progress and for the needed blood tests and blood pressure measurements. It is especially important for the doctor to make sure your hemoglobin level is in the desired range, to limit the risk of potential side effects and to give you the best benefit. Keep all appointments for any recommended tests. Check your blood pressure as directed. Ask your doctor what your blood pressure should be and when you should contact him or her. As your body makes more red blood cells, you may need to take iron, folic acid, or vitamin B supplements. Ask your doctor or health care provider which products are right for you. If you have kidney disease continue dietary restrictions, even though this medication can make you feel better. Talk with your doctor or health   care professional about the foods you eat and the vitamins that you take. What side effects may I notice from receiving this medicine? Side effects that you should report to your doctor or health care professional as soon as possible: -allergic reactions like skin rash, itching or hives, swelling of the face, lips, or tongue -breathing problems -changes in vision -chest  pain -confusion, trouble speaking or understanding -feeling faint or lightheaded, falls -high blood pressure -muscle aches or pains -pain, swelling, warmth in the leg -rapid weight gain -severe headaches -sudden numbness or weakness of the face, arm or leg -trouble walking, dizziness, loss of balance or coordination -seizures (convulsions) -swelling of the ankles, feet, hands -unusually weak or tired Side effects that usually do not require medical attention (report to your doctor or health care professional if they continue or are bothersome): -diarrhea -fever, chills (flu-like symptoms) -headaches -nausea, vomiting -redness, stinging, or swelling at site where injected This list may not describe all possible side effects. Call your doctor for medical advice about side effects. You may report side effects to FDA at 1-800-FDA-1088. Where should I keep my medicine? Keep out of the reach of children. Store in a refrigerator between 2 and 8 degrees C (36 and 46 degrees F). Do not freeze. Do not shake. Throw away any unused portion if using a single-dose vial. Throw away any unused medicine after the expiration date. NOTE: This sheet is a summary. It may not cover all possible information. If you have questions about this medicine, talk to your doctor, pharmacist, or health care provider.  2015, Elsevier/Gold Standard. (2008-03-29 10:23:57) Tbo-Filgrastim injection What is this medicine? TBO-FILGRASTIM (T B O fil GRA stim) is a granulocyte colony-stimulating factor that stimulates the growth of neutrophils, a type of white blood cell important in the body's fight against infection. It is used to reduce the incidence of fever and infection in patients with certain types of cancer who are receiving chemotherapy that affects the bone marrow. This medicine may be used for other purposes; ask your health care provider or pharmacist if you have questions. COMMON BRAND NAME(S): Granix What should  I tell my health care provider before I take this medicine? They need to know if you have any of these conditions: -ongoing radiation therapy -sickle cell anemia -an unusual or allergic reaction to tbo-filgrastim, filgrastim, pegfilgrastim, other medicines, foods, dyes, or preservatives -pregnant or trying to get pregnant -breast-feeding How should I use this medicine? This medicine is for injection under the skin. If you get this medicine at home, you will be taught how to prepare and give this medicine. Refer to the Instructions for Use that come with your medication packaging. Use exactly as directed. Take your medicine at regular intervals. Do not take your medicine more often than directed. It is important that you put your used needles and syringes in a special sharps container. Do not put them in a trash can. If you do not have a sharps container, call your pharmacist or healthcare provider to get one. Talk to your pediatrician regarding the use of this medicine in children. Special care may be needed. Overdosage: If you think you've taken too much of this medicine contact a poison control center or emergency room at once. Overdosage: If you think you have taken too much of this medicine contact a poison control center or emergency room at once. NOTE: This medicine is only for you. Do not share this medicine with others. What if I miss a dose? It   is important not to miss your dose. Call your doctor or health care professional if you miss a dose. What may interact with this medicine? This medicine may interact with the following medications: -medicines that may cause a release of neutrophils, such as lithium This list may not describe all possible interactions. Give your health care provider a list of all the medicines, herbs, non-prescription drugs, or dietary supplements you use. Also tell them if you smoke, drink alcohol, or use illegal drugs. Some items may interact with your  medicine. What should I watch for while using this medicine? You may need blood work done while you are taking this medicine. What side effects may I notice from receiving this medicine? Side effects that you should report to your doctor or health care professional as soon as possible: -allergic reactions like skin rash, itching or hives, swelling of the face, lips, or tongue -shortness of breath or breathing problems -fever -pain, redness, or irritation at site where injected -pinpoint red spots on the skin -stomach or side pain, or pain at the shoulder -swelling -tiredness -trouble passing urine Side effects that usually do not require medical attention (Report these to your doctor or health care professional if they continue or are bothersome.): -bone pain -muscle pain This list may not describe all possible side effects. Call your doctor for medical advice about side effects. You may report side effects to FDA at 1-800-FDA-1088. Where should I keep my medicine? Keep out of the reach of children. Store in a refrigerator between 2 and 8 degrees C (36 and 46 degrees F). Keep in carton to protect from light. Throw away this medicine if it is left out of the refrigerator for more than 5 consecutive days. Throw away any unused medicine after the expiration date. NOTE: This sheet is a summary. It may not cover all possible information. If you have questions about this medicine, talk to your doctor, pharmacist, or health care provider.  2015, Elsevier/Gold Standard. (2013-08-05 11:52:29)  

## 2014-02-18 ENCOUNTER — Encounter: Payer: Self-pay | Admitting: Cardiovascular Disease

## 2014-02-23 ENCOUNTER — Non-Acute Institutional Stay (HOSPITAL_COMMUNITY)
Admission: AD | Admit: 2014-02-23 | Discharge: 2014-02-23 | Disposition: A | Discharge status: Home / Self Care | Payer: Medicare Other | Source: Ambulatory Visit | Attending: Hematology and Oncology | Admitting: Hematology and Oncology

## 2014-02-23 ENCOUNTER — Other Ambulatory Visit: Payer: Self-pay | Admitting: Hematology and Oncology

## 2014-02-23 ENCOUNTER — Other Ambulatory Visit (HOSPITAL_BASED_OUTPATIENT_CLINIC_OR_DEPARTMENT_OTHER): Payer: Medicare Other

## 2014-02-23 ENCOUNTER — Other Ambulatory Visit: Payer: Self-pay | Admitting: *Deleted

## 2014-02-23 DIAGNOSIS — D469 Myelodysplastic syndrome, unspecified: Secondary | ICD-10-CM

## 2014-02-23 DIAGNOSIS — D630 Anemia in neoplastic disease: Secondary | ICD-10-CM | POA: Diagnosis present

## 2014-02-23 LAB — PREPARE RBC (CROSSMATCH)

## 2014-02-23 LAB — TECHNOLOGIST REVIEW: Technologist Review: 9

## 2014-02-23 LAB — CBC WITH DIFFERENTIAL/PLATELET
BASO%: 0.7 % (ref 0.0–2.0)
Basophils Absolute: 0 10*3/uL (ref 0.0–0.1)
EOS%: 0 % (ref 0.0–7.0)
Eosinophils Absolute: 0 10*3/uL (ref 0.0–0.5)
HCT: 20.3 % — ABNORMAL LOW (ref 34.8–46.6)
HGB: 6.5 g/dL — CL (ref 11.6–15.9)
LYMPH%: 31.6 % (ref 14.0–49.7)
MCH: 27.8 pg (ref 25.1–34.0)
MCHC: 32 g/dL (ref 31.5–36.0)
MCV: 86.8 fL (ref 79.5–101.0)
MONO#: 0.5 10*3/uL (ref 0.1–0.9)
MONO%: 10 % (ref 0.0–14.0)
NEUT#: 2.6 10*3/uL (ref 1.5–6.5)
NEUT%: 57.7 % (ref 38.4–76.8)
Platelets: 289 10*3/uL (ref 145–400)
RBC: 2.34 10*6/uL — ABNORMAL LOW (ref 3.70–5.45)
RDW: 15.4 % — ABNORMAL HIGH (ref 11.2–14.5)
WBC: 4.5 10*3/uL (ref 3.9–10.3)
lymph#: 1.4 10*3/uL (ref 0.9–3.3)
nRBC: 2 % — ABNORMAL HIGH (ref 0–0)

## 2014-02-23 LAB — HOLD TUBE, BLOOD BANK

## 2014-02-23 MED ORDER — SODIUM CHLORIDE 0.9 % IJ SOLN
INTRAMUSCULAR | Status: AC
  Administered 2014-02-23: 10 mL
  Filled 2014-02-23: qty 10

## 2014-02-23 MED ORDER — SODIUM CHLORIDE 0.9 % IJ SOLN: 3.0000 mL | INTRAMUSCULAR | Status: DC | PRN

## 2014-02-23 MED ORDER — SODIUM CHLORIDE 0.9 % IV SOLN
250.0000 mL | Freq: Once | INTRAVENOUS | Status: AC
  Administered 2014-02-23: 250 mL via INTRAVENOUS

## 2014-02-23 MED ORDER — HEPARIN SOD (PORK) LOCK FLUSH 100 UNIT/ML IV SOLN
500.0000 [IU] | Freq: Every day | INTRAVENOUS | Status: AC | PRN
  Administered 2014-02-23: 500 [IU]
  Filled 2014-02-23: qty 5

## 2014-02-23 MED ORDER — SODIUM CHLORIDE 0.9 % IJ SOLN
10.0000 mL | INTRAMUSCULAR | Status: AC | PRN
  Administered 2014-02-23: 10 mL

## 2014-02-23 NOTE — Progress Notes (Signed)
Pt scheduled to have one unit of blood at [facility] today.  Informed pt and husband in lobby and they understand to go directly there.

## 2014-02-23 NOTE — Procedures (Signed)
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

## 2014-02-23 NOTE — H&P (Signed)
The patient is here for transfusion only  

## 2014-02-24 ENCOUNTER — Encounter: Payer: Self-pay | Admitting: Family Medicine

## 2014-02-24 ENCOUNTER — Ambulatory Visit (INDEPENDENT_AMBULATORY_CARE_PROVIDER_SITE_OTHER): Payer: Medicare Other | Admitting: Family Medicine

## 2014-02-24 ENCOUNTER — Telehealth: Payer: Self-pay | Admitting: *Deleted

## 2014-02-24 VITALS — BP 128/66 | HR 76 | Temp 98.1°F | Wt 111.0 lb

## 2014-02-24 DIAGNOSIS — R110 Nausea: Secondary | ICD-10-CM

## 2014-02-24 DIAGNOSIS — E0842 Diabetes mellitus due to underlying condition with diabetic polyneuropathy: Secondary | ICD-10-CM

## 2014-02-24 DIAGNOSIS — E785 Hyperlipidemia, unspecified: Secondary | ICD-10-CM

## 2014-02-24 LAB — TYPE AND SCREEN
ABO/RH(D): AB NEG
Antibody Screen: NEGATIVE
DONOR AG TYPE: NEGATIVE
UNIT DIVISION: 0

## 2014-02-24 MED ORDER — ONDANSETRON 4 MG PO TBDP: 4.0000 mg | ORAL_TABLET | Freq: Three times a day (TID) | ORAL | Status: AC | PRN

## 2014-02-24 MED ORDER — PREGABALIN 50 MG PO CAPS: 50.0000 mg | ORAL_CAPSULE | Freq: Two times a day (BID) | ORAL | Status: DC

## 2014-02-24 NOTE — Patient Instructions (Signed)
Drink plenty of fluids. Stop Gabapentin Start Lyrica 50 mg twice daily (for neuropathy pain) Take Zofran one every 8 hours as needed for nausea. Stop Simvastatin for now Hold Diclofenac (if you are still taking).

## 2014-02-24 NOTE — Progress Notes (Signed)
Pre visit review using our clinic review tool, if applicable. No additional management support is needed unless otherwise documented below in the visit note. 

## 2014-02-24 NOTE — Telephone Encounter (Signed)
Pt called on call nurse last night c/o feeling dizzy especially w/ moving her head. She got one unit of blood yesterday.  Blood sugar was 134 last night.  Called pt this morning to check on her.  She says she slept well and feels better this morning.  No dizziness since last night.  She was worried she was having some type of reaction to her transfusion yesterday, but feeling better now. Pt has appt to see her PCP this afternoon.  She will call us back if anything changes.

## 2014-02-24 NOTE — Progress Notes (Signed)
Subjective:    Patient ID: Meredith Rodriguez, female    DOB: 29-Feb-1926, 80 y.o.   MRN: [id number]  Dizziness Associated symptoms include fatigue and nausea. Pertinent negatives include no abdominal pain, chest pain, chills, coughing, fever or vomiting.   Patient has history of myelodysplastic syndrome, hypertension, hyperlipidemia, type 2 diabetes, diabetic neuropathy. She presents today with nonspecific symptoms of poor appetite, nausea without vomiting and dizziness. She had blood transfusion of 1 unit yesterday. She went home and took a nap which she woke up she felt lightheaded and dizzy and nauseous. She's had no vomiting. No abdominal pain. No headaches. No chest pains. No dysuria. She does state she did not drink a lot of fluids or eat much during the day yesterday. She has Some Fluids Today but Still Has Some Nausea.  She has peripheral neuropathy which is been poorly controlled. She is currently on gabapentin 100 mg 2 tablets 3 times daily. She previously took Lyrica she thinks and she has recollection of this was more effective. She has diclofenac on her med list and does not recall taking this.  Past Medical History  Diagnosis Date  . Hypertension   . Diabetes mellitus   . Hyperkalemia   . IBS (irritable bowel syndrome)   . Hypercholesterolemia   . Cervical radiculopathy   . Anemia   . Cancer     leukemia  . Leukemia    Past Surgical History  Procedure Laterality Date  . Varicose veins      surgey 1959  . Tonsilectomy, adenoidectomy, bilateral myringotomy and tubes    . Esophagogastroduodenoscopy  04/01/2011    Procedure: ESOPHAGOGASTRODUODENOSCOPY (EGD);  Surgeon: Missy Sabins, MD;  Location[facility] ENDOSCOPY;  Service: Endoscopy;  Laterality: N/A;    reports that she has never smoked. She has never used smokeless tobacco. She reports that she does not drink alcohol or use illicit drugs. family history includes Cancer in her daughter, sister, sister, and sister; Diabetes in her  sister; Hypertension in her father; Stroke in her mother. Allergies  Allergen Reactions  . Amoxicillin [Amoxicillin]     Upset stomach  . Cephalexin Nausea Only  . Metformin And Related     Gi problems  . Phenergan [Promethazine Hcl] Other (See Comments)    Unknown   . Procardia [Nifedipine]     dizziness      Review of Systems  Constitutional: Positive for fatigue. Negative for fever and chills.  Respiratory: Negative for cough.   Cardiovascular: Negative for chest pain.  Gastrointestinal: Positive for nausea. Negative for vomiting, abdominal pain and diarrhea.  Genitourinary: Negative for dysuria.  Neurological: Positive for dizziness.  Psychiatric/Behavioral: Negative for confusion.       Objective:   Physical Exam  Constitutional: She is oriented to person, place, and time. She appears well-developed and well-nourished.  HENT:  Tongue is slightly dry otherwise clear  Neck: Neck supple.  Cardiovascular: Normal rate and regular rhythm.   Pulmonary/Chest: Effort normal and breath sounds normal. No respiratory distress. She has no wheezes. She has no rales.  Abdominal: Soft. Bowel sounds are normal. She exhibits no distension. There is no tenderness. There is no rebound.  Musculoskeletal: She exhibits edema.  Trace edema legs bilaterally  Neurological: She is alert and oriented to person, place, and time.          Assessment & Plan:  #1 patient presents with symptoms including lightheadedness and nausea. Question dehydration. She apparently had fewer fluids than usual yesterday. Check basic  metabolic panel. Increase fluid intake. May need to hold furosemide based on lab results. She does have some mild peripheral edema but overall stable. #2 diabetic peripheral neuropathy poorly controlled. Discontinue gabapentin. Start Lyrica 50 mg twice a day. Reassess one month and titrated as necessary #3 hyperlipidemia. Patient had questions regarding trying to get off some of her  medications. She is on very low-dose dose simvastatin and will give her option of discontinuing this. We've also asked that she confirmed that she is not taking diclofenac.

## 2014-02-25 ENCOUNTER — Telehealth: Payer: Self-pay | Admitting: Family Medicine

## 2014-02-25 LAB — BASIC METABOLIC PANEL
BUN: 24 mg/dL — AB (ref 6–23)
CHLORIDE: 97 meq/L (ref 96–112)
CO2: 22 meq/L (ref 19–32)
CREATININE: 0.8 mg/dL (ref 0.4–1.2)
Calcium: 9.2 mg/dL (ref 8.4–10.5)
GFR: 69.22 mL/min (ref >60.00)
GLUCOSE: 89 mg/dL (ref 70–99)
Potassium: 4.7 mEq/L (ref 3.5–5.1)
Sodium: 131 mEq/L — ABNORMAL LOW (ref 135–145)

## 2014-02-25 NOTE — Telephone Encounter (Signed)
Pt was seen yesterday and got med list and furosemide and klor-con was not list. Pt would like to know should she continue to take meds

## 2014-02-25 NOTE — Telephone Encounter (Signed)
Pt informed that RX's are on her current med list.

## 2014-03-02 ENCOUNTER — Other Ambulatory Visit: Payer: Self-pay | Admitting: Hematology and Oncology

## 2014-03-02 ENCOUNTER — Telehealth: Payer: Self-pay | Admitting: *Deleted

## 2014-03-02 ENCOUNTER — Ambulatory Visit (HOSPITAL_BASED_OUTPATIENT_CLINIC_OR_DEPARTMENT_OTHER): Payer: Medicare Other

## 2014-03-02 ENCOUNTER — Ambulatory Visit (HOSPITAL_COMMUNITY)
Admission: RE | Admit: 2014-03-02 | Discharge: 2014-03-02 | Disposition: A | Discharge status: Home / Self Care | Payer: Medicare Other | Source: Ambulatory Visit | Attending: Oncology | Admitting: Oncology

## 2014-03-02 ENCOUNTER — Other Ambulatory Visit (HOSPITAL_BASED_OUTPATIENT_CLINIC_OR_DEPARTMENT_OTHER): Payer: Medicare Other

## 2014-03-02 DIAGNOSIS — D469 Myelodysplastic syndrome, unspecified: Secondary | ICD-10-CM | POA: Insufficient documentation

## 2014-03-02 DIAGNOSIS — D630 Anemia in neoplastic disease: Secondary | ICD-10-CM

## 2014-03-02 LAB — CBC WITH DIFFERENTIAL/PLATELET
BASO%: 0.9 % (ref 0.0–2.0)
Basophils Absolute: 0 10*3/uL (ref 0.0–0.1)
EOS ABS: 0 10*3/uL (ref 0.0–0.5)
EOS%: 0.3 % (ref 0.0–7.0)
HCT: 22.2 % — ABNORMAL LOW (ref 34.8–46.6)
HGB: 7.2 g/dL — ABNORMAL LOW (ref 11.6–15.9)
LYMPH%: 37 % (ref 14.0–49.7)
MCH: 28.1 pg (ref 25.1–34.0)
MCHC: 32.4 g/dL (ref 31.5–36.0)
MCV: 86.7 fL (ref 79.5–101.0)
MONO#: 0.2 10*3/uL (ref 0.1–0.9)
MONO%: 7 % (ref 0.0–14.0)
NEUT%: 54.8 % (ref 38.4–76.8)
NEUTROS ABS: 1.7 10*3/uL (ref 1.5–6.5)
PLATELETS: 364 10*3/uL (ref 145–400)
RBC: 2.56 10*6/uL — AB (ref 3.70–5.45)
RDW: 15.6 % — ABNORMAL HIGH (ref 11.2–14.5)
WBC: 3.2 10*3/uL — ABNORMAL LOW (ref 3.9–10.3)
lymph#: 1.2 10*3/uL (ref 0.9–3.3)
nRBC: 2 % — ABNORMAL HIGH (ref 0–0)

## 2014-03-02 LAB — TECHNOLOGIST REVIEW

## 2014-03-02 LAB — HOLD TUBE, BLOOD BANK

## 2014-03-02 MED ORDER — FILGRASTIM 300 MCG/0.5ML IJ SOLN: 300.0000 ug | Freq: Once | INTRAMUSCULAR | Status: DC

## 2014-03-02 MED ORDER — DARBEPOETIN ALFA 500 MCG/ML IJ SOSY
500.0000 ug | PREFILLED_SYRINGE | Freq: Once | INTRAMUSCULAR | Status: AC
  Administered 2014-03-02: 500 ug via SUBCUTANEOUS
  Filled 2014-03-02: qty 1

## 2014-03-02 MED ORDER — TBO-FILGRASTIM 300 MCG/0.5ML ~~LOC~~ SOSY
300.0000 ug | PREFILLED_SYRINGE | Freq: Once | SUBCUTANEOUS | Status: AC
  Administered 2014-03-02: 300 ug via SUBCUTANEOUS
  Filled 2014-03-02: qty 0.5

## 2014-03-02 NOTE — Telephone Encounter (Signed)
Per desk RN I have scheduled patient for treatment, desjk RN to call them

## 2014-03-02 NOTE — Telephone Encounter (Signed)
Pt needs one unit of blood transfused per Dr. Alvy Bimler.  Pt did not want to go to [facility] today for blood. She wanted to wait until she could get transfusion here at [facility].   Called pt and notified for appt for Friday at 12:30 pm and instructed to leave on her blue blood band bracelet until transfusion.  She verbalized understanding.

## 2014-03-04 ENCOUNTER — Ambulatory Visit (HOSPITAL_BASED_OUTPATIENT_CLINIC_OR_DEPARTMENT_OTHER): Payer: Medicare Other

## 2014-03-04 VITALS — BP 138/45 | HR 72 | Temp 96.9°F | Resp 17

## 2014-03-04 DIAGNOSIS — D630 Anemia in neoplastic disease: Secondary | ICD-10-CM

## 2014-03-04 DIAGNOSIS — D469 Myelodysplastic syndrome, unspecified: Secondary | ICD-10-CM | POA: Diagnosis not present

## 2014-03-04 MED ORDER — HEPARIN SOD (PORK) LOCK FLUSH 100 UNIT/ML IV SOLN
500.0000 [IU] | Freq: Every day | INTRAVENOUS | Status: AC | PRN
  Administered 2014-03-04: 500 [IU]
  Filled 2014-03-04: qty 5

## 2014-03-04 MED ORDER — SODIUM CHLORIDE 0.9 % IV SOLN
250.0000 mL | Freq: Once | INTRAVENOUS | Status: AC
  Administered 2014-03-04: 250 mL via INTRAVENOUS

## 2014-03-04 MED ORDER — SODIUM CHLORIDE 0.9 % IJ SOLN
10.0000 mL | INTRAMUSCULAR | Status: AC | PRN
  Administered 2014-03-04: 10 mL
  Filled 2014-03-04: qty 10

## 2014-03-04 NOTE — Patient Instructions (Signed)

## 2014-03-05 LAB — TYPE AND SCREEN
ABO/RH(D): AB NEG
Antibody Screen: NEGATIVE
DONOR AG TYPE: NEGATIVE
Unit division: 0

## 2014-03-08 ENCOUNTER — Telehealth: Payer: Self-pay | Admitting: Hematology and Oncology

## 2014-03-08 NOTE — Telephone Encounter (Signed)
, °

## 2014-03-09 ENCOUNTER — Other Ambulatory Visit: Payer: Medicare Other

## 2014-03-10 ENCOUNTER — Other Ambulatory Visit (HOSPITAL_BASED_OUTPATIENT_CLINIC_OR_DEPARTMENT_OTHER): Payer: Medicare Other

## 2014-03-10 ENCOUNTER — Ambulatory Visit: Payer: Medicare Other

## 2014-03-10 ENCOUNTER — Other Ambulatory Visit: Payer: Self-pay | Admitting: *Deleted

## 2014-03-10 VITALS — BP 142/46 | HR 79 | Temp 98.2°F | Resp 18

## 2014-03-10 DIAGNOSIS — D630 Anemia in neoplastic disease: Secondary | ICD-10-CM

## 2014-03-10 DIAGNOSIS — D469 Myelodysplastic syndrome, unspecified: Secondary | ICD-10-CM

## 2014-03-10 LAB — CBC WITH DIFFERENTIAL/PLATELET
BASO%: 1 % (ref 0.0–2.0)
Basophils Absolute: 0 10*3/uL (ref 0.0–0.1)
EOS%: 0 % (ref 0.0–7.0)
Eosinophils Absolute: 0 10*3/uL (ref 0.0–0.5)
HEMATOCRIT: 23.1 % — AB (ref 34.8–46.6)
HGB: 7.6 g/dL — ABNORMAL LOW (ref 11.6–15.9)
LYMPH#: 1.5 10*3/uL (ref 0.9–3.3)
LYMPH%: 47.9 % (ref 14.0–49.7)
MCH: 28.4 pg (ref 25.1–34.0)
MCHC: 32.9 g/dL (ref 31.5–36.0)
MCV: 86.2 fL (ref 79.5–101.0)
MONO#: 0.5 10*3/uL (ref 0.1–0.9)
MONO%: 15 % — ABNORMAL HIGH (ref 0.0–14.0)
NEUT%: 36.1 % — AB (ref 38.4–76.8)
NEUTROS ABS: 1.1 10*3/uL — AB (ref 1.5–6.5)
Platelets: 275 10*3/uL (ref 145–400)
RBC: 2.68 10*6/uL — ABNORMAL LOW (ref 3.70–5.45)
RDW: 15.8 % — AB (ref 11.2–14.5)
WBC: 3.1 10*3/uL — AB (ref 3.9–10.3)
nRBC: 2 % — ABNORMAL HIGH (ref 0–0)

## 2014-03-10 LAB — PREPARE RBC (CROSSMATCH)

## 2014-03-10 LAB — HOLD TUBE, BLOOD BANK

## 2014-03-10 NOTE — Patient Instructions (Signed)

## 2014-03-11 LAB — TYPE AND SCREEN
ABO/RH(D): AB NEG
Antibody Screen: NEGATIVE
DONOR AG TYPE: NEGATIVE
Donor AG Type: NEGATIVE
UNIT DIVISION: 0
UNIT DIVISION: 0

## 2014-03-15 ENCOUNTER — Telehealth: Payer: Self-pay | Admitting: Family Medicine

## 2014-03-15 NOTE — Telephone Encounter (Signed)
If her leg edema is stable, I would hold furosemide.  They may consider daily weights and if weight up more than 3 pounds in one week will likely need to  Go back.

## 2014-03-15 NOTE — Telephone Encounter (Signed)
Meredith Rodriguez calling to verify pt's medications. Pt stopped taking Furosemide after her last office visit on 02/24/14 pending labs. Sodium was slightly low. She questions if she should resume the Furosemide.

## 2014-03-16 ENCOUNTER — Telehealth: Payer: Self-pay | Admitting: Hematology and Oncology

## 2014-03-16 ENCOUNTER — Ambulatory Visit (HOSPITAL_BASED_OUTPATIENT_CLINIC_OR_DEPARTMENT_OTHER): Payer: Medicare Other

## 2014-03-16 ENCOUNTER — Ambulatory Visit (HOSPITAL_BASED_OUTPATIENT_CLINIC_OR_DEPARTMENT_OTHER): Payer: Medicare Other | Admitting: Hematology and Oncology

## 2014-03-16 ENCOUNTER — Encounter: Payer: Self-pay | Admitting: Hematology and Oncology

## 2014-03-16 ENCOUNTER — Other Ambulatory Visit (HOSPITAL_BASED_OUTPATIENT_CLINIC_OR_DEPARTMENT_OTHER): Payer: Medicare Other

## 2014-03-16 VITALS — BP 130/41 | HR 84 | Temp 97.8°F | Resp 18 | Ht 66.0 in | Wt 115.6 lb

## 2014-03-16 DIAGNOSIS — D469 Myelodysplastic syndrome, unspecified: Secondary | ICD-10-CM

## 2014-03-16 DIAGNOSIS — D72819 Decreased white blood cell count, unspecified: Secondary | ICD-10-CM

## 2014-03-16 DIAGNOSIS — Z5189 Encounter for other specified aftercare: Secondary | ICD-10-CM

## 2014-03-16 DIAGNOSIS — D630 Anemia in neoplastic disease: Secondary | ICD-10-CM

## 2014-03-16 LAB — CBC WITH DIFFERENTIAL/PLATELET
BASO%: 2.8 % — ABNORMAL HIGH (ref 0.0–2.0)
Basophils Absolute: 0.1 10*3/uL (ref 0.0–0.1)
EOS%: 0 % (ref 0.0–7.0)
Eosinophils Absolute: 0 10*3/uL (ref 0.0–0.5)
HCT: 26.9 % — ABNORMAL LOW (ref 34.8–46.6)
HGB: 8.6 g/dL — ABNORMAL LOW (ref 11.6–15.9)
LYMPH%: 46.5 % (ref 14.0–49.7)
MCH: 28.4 pg (ref 25.1–34.0)
MCHC: 32 g/dL (ref 31.5–36.0)
MCV: 88.8 fL (ref 79.5–101.0)
MONO#: 0.4 10*3/uL (ref 0.1–0.9)
MONO%: 16.5 % — AB (ref 0.0–14.0)
NEUT%: 34.2 % — ABNORMAL LOW (ref 38.4–76.8)
NEUTROS ABS: 0.9 10*3/uL — AB (ref 1.5–6.5)
NRBC: 3 % — AB (ref 0–0)
Platelets: 210 10*3/uL (ref 145–400)
RBC: 3.03 10*6/uL — AB (ref 3.70–5.45)
RDW: 16.1 % — AB (ref 11.2–14.5)
WBC: 2.5 10*3/uL — ABNORMAL LOW (ref 3.9–10.3)
lymph#: 1.2 10*3/uL (ref 0.9–3.3)

## 2014-03-16 LAB — HOLD TUBE, BLOOD BANK

## 2014-03-16 MED ORDER — FILGRASTIM 300 MCG/0.5ML IJ SOLN: 300.0000 ug | Freq: Once | INTRAMUSCULAR | Status: DC

## 2014-03-16 MED ORDER — PREDNISONE 10 MG PO TABS: 10.0000 mg | ORAL_TABLET | Freq: Every day | ORAL | Status: DC

## 2014-03-16 MED ORDER — DARBEPOETIN ALFA 500 MCG/ML IJ SOSY
500.0000 ug | PREFILLED_SYRINGE | Freq: Once | INTRAMUSCULAR | Status: AC
  Administered 2014-03-16: 500 ug via SUBCUTANEOUS
  Filled 2014-03-16: qty 1

## 2014-03-16 MED ORDER — TBO-FILGRASTIM 300 MCG/0.5ML ~~LOC~~ SOSY
300.0000 ug | PREFILLED_SYRINGE | Freq: Once | SUBCUTANEOUS | Status: AC
  Administered 2014-03-16: 300 ug via SUBCUTANEOUS
  Filled 2014-03-16: qty 0.5

## 2014-03-16 NOTE — Assessment & Plan Note (Signed)
I had a long discussion with the patient and her daughter. In terms of supportive care, I recommend transfusion and erythropoietin stimulating agents with darbepoetin. I recommend we increase the dose of darbopoeitin to 500 mcg every 2 weeks for hemoglobin less than 10 g. I also recommend blood transfusion threshold at 7 g and  I plan to increase transfusion to 2 units of blood each time. The patient wants to get away from premedication if possible because Benadryl will make her sleepy. Her blood count is not improving.  I recommend addition of prednisone at 10 mg daily and we will reassess in 2 months. She agreed.

## 2014-03-16 NOTE — Assessment & Plan Note (Signed)
This could be related to disease. She is not symptomatic. We'll observe.  She will continue Granix injection.

## 2014-03-16 NOTE — Telephone Encounter (Signed)
Pt confirmed labs/ov per 11/18 POF, gave pt AVS..... KJ sent msg to add transfusion

## 2014-03-16 NOTE — Assessment & Plan Note (Signed)
As above, we will change her transfusion parameters to 2 units each time for hemoglobin less than 7 g.

## 2014-03-16 NOTE — Telephone Encounter (Signed)
Left message for patient daughter to return call.  

## 2014-03-16 NOTE — Telephone Encounter (Signed)
Pt daughter is informed.

## 2014-03-16 NOTE — Progress Notes (Signed)
[facility] PROGRESS NOTE  Patient Care Team: [name], MD as PCP - General  SUMMARY OF ONCOLOGIC HISTORY: Summary of her history is as follows: #30 March 2011, she was noted to have anemia with associated thrombocytosis. She became transfusion dependent and was referred to see a hematologist here for further evaluation. The patient has lack of reticulocytosis with associated elevated erythropoietin level of 197. #2 on 05/28/2011, bone marrow aspirate and biopsy was hypercellular with increased myelofibrosis. There were no evidence of increased blasts. #3 The patient was started on erythropoietin stimulating agent with Procrit injections 40,000 units every 2 weeks beginning of 11/20/2011 along with transfusion support. By October 2013, Procrit was deemed ineffective. She was subsequently recommended to consider chemotherapy. #4 On 02/19/2012, repeat bone marrow aspirate and biopsy show persistent myelofibrosis and hypercellular marrow. It was difficult to estimate percentage of blastic cells to do sampling error. By flow cytometry, 10% of abnormal cells were detected. #5 From 02/24/2012 to 05/08/2012, she received 3 cycles of decitabine. The last cycle of treatment was complicated by febrile neutropenia and pneumonia requiring hospitalization. Decision was made to discontinue chemotherapy and to return to clinic for transfusion support only. #6 Due to severe iron overload, she was started on Exjade around September 2014 to May 2015. #7 On 09/09/13: She had repeat bone marrow aspirate and biopsy which showed persistent myelodysplastic syndrome. Aranesp was restarted.  INTERVAL HISTORY: Please see below for problem oriented charting.  she complain of feeling fatigued. The patient denies any recent signs or symptoms of bleeding such as spontaneous epistaxis, hematuria or hematochezia.  Denies recent infection.  REVIEW OF SYSTEMS:   Constitutional: Denies fevers,  chills or abnormal weight loss Eyes: Denies blurriness of vision Ears, nose, mouth, throat, and face: Denies mucositis or sore throat Respiratory: Denies cough, dyspnea or wheezes Cardiovascular: Denies palpitation, chest discomfort or lower extremity swelling Gastrointestinal:  Denies nausea, heartburn or change in bowel habits Skin: Denies abnormal skin rashes Lymphatics: Denies new lymphadenopathy or easy bruising Neurological:Denies numbness, tingling or new weaknesses Behavioral/Psych: Mood is stable, no new changes  All other systems were reviewed with the patient and are negative.  I have reviewed the past medical history, past surgical history, social history and family history with the patient and they are unchanged from previous note.  ALLERGIES:  is allergic to amoxicillin; cephalexin; metformin and related; phenergan; and procardia.  MEDICATIONS:  Current Outpatient Prescriptions  Medication Sig Dispense Refill  . amLODipine (NORVASC) 5 MG tablet Take 1 tablet (5 mg total) by mouth daily. 30 tablet 5  . aspirin 81 MG tablet Take 81 mg by mouth daily.    . B-D ULTRAFINE III SHORT PEN 31G X 8 MM MISC USE WITH LANTUS PEN DAILY AS INSTRUCTED 100 each 2  . cholecalciferol (VITAMIN D) 1000 UNITS tablet Take 1,000 Units by mouth daily.    . furosemide (LASIX) 20 MG tablet Take 1 tablet (20 mg total) by mouth daily. 30 tablet 5  . insulin glargine (LANTUS) 100 UNIT/ML injection Inject 10 Units into the skin at bedtime.    . insulin lispro (HUMALOG KWIKPEN) 100 UNIT/ML SOPN Inject 4 Units into the skin 2 (two) times daily with a meal. Inject 4 units twice daily into skin if blood glucose is over 100 at lunch and dinner    . lidocaine-prilocaine (EMLA) cream Apply 1 application topically as needed (to access port).    . metoprolol (LOPRESSOR) 50 MG tablet Take 1 tablet (50 mg  total) by mouth 2 (two) times daily. 180 tablet 3  . Omega-3 Fatty Acids (FISH OIL) 1000 MG CAPS Take 1,000 mg  by mouth daily.     Glory Rosebush DELICA LANCETS 38B MISC USE AS DIRECTED. DX. 250.00 100 each 3  . potassium chloride (KLOR-CON 10) 10 MEQ tablet Take 1 tablet (10 mEq total) by mouth daily. 30 tablet 5  . pregabalin (LYRICA) 50 MG capsule Take 1 capsule (50 mg total) by mouth 2 (two) times daily. 60 capsule 5  . ondansetron (ZOFRAN ODT) 4 MG disintegrating tablet Take 1 tablet (4 mg total) by mouth every 8 (eight) hours as needed for nausea or vomiting. 20 tablet 0  . predniSONE (DELTASONE) 10 MG tablet Take 1 tablet (10 mg total) by mouth daily with breakfast. 30 tablet 2  . [DISCONTINUED] glimepiride (AMARYL) 2 MG tablet Take 1 tablet (2 mg total) by mouth every morning. 30 tablet 11   Current Facility-Administered Medications  Medication Dose Route Frequency Provider Last Rate Last Dose  . TDaP (BOOSTRIX) injection 0.5 mL  0.5 mL Intramuscular Once [name], MD        PHYSICAL EXAMINATION: ECOG PERFORMANCE STATUS: 2 - Symptomatic, <50% confined to bed  Filed Vitals:   03/16/14 0924  BP: 130/41  Pulse: 84  Temp: 97.8 F (36.6 C)  Resp: 18   Filed Weights   03/16/14 0924  Weight: 115 lb 9.6 oz (52.436 kg)    GENERAL:alert, no distress and comfortable. she looks thin SKIN: skin color is pale, texture, turgor are normal, no rashes or significant lesions EYES: normal, Conjunctiva are pale and non-injected, sclera clear OROPHARYNX:no exudate, no erythema and lips, buccal mucosa, and tongue normal  NECK: supple, thyroid normal size, non-tender, without nodularity LYMPH:  no palpable lymphadenopathy in the cervical, axillary or inguinal LUNGS: clear to auscultation and percussion with normal breathing effort HEART: regular rate & rhythm and no murmurs  With bilateral lower extremity edema ABDOMEN:abdomen soft, non-tender and normal bowel sounds Musculoskeletal:no cyanosis of digits and no clubbing  NEURO: alert & oriented x 3 with fluent speech, no focal motor/sensory  deficits  LABORATORY DATA:  I have reviewed the data as listed    Component Value Date/Time   NA 131* 02/24/2014 1726   NA 141 09/29/2013 0830   K 4.7 02/24/2014 1726   K 4.2 09/29/2013 0830   CL 97 02/24/2014 1726   CL 99 06/01/2012 1028   CO2 22 02/24/2014 1726   CO2 24 09/29/2013 0830   GLUCOSE 89 02/24/2014 1726   GLUCOSE 172* 09/29/2013 0830   GLUCOSE 230* 06/01/2012 1028   BUN 24* 02/24/2014 1726   BUN 26.6* 09/29/2013 0830   CREATININE 0.8 02/24/2014 1726   CREATININE 1.4* 09/29/2013 0830   CREATININE 0.69 07/18/2010 1202   CALCIUM 9.2 02/24/2014 1726   CALCIUM 9.7 09/29/2013 0830   PROT 7.0 12/05/2013 1808   PROT 7.3 09/29/2013 0830   ALBUMIN 3.6 12/05/2013 1808   ALBUMIN 3.5 09/29/2013 0830   AST 29 12/05/2013 1808   AST 19 09/29/2013 0830   ALT 26 12/05/2013 1808   ALT 17 09/29/2013 0830   ALKPHOS 99 12/05/2013 1808   ALKPHOS 84 09/29/2013 0830   BILITOT 0.5 12/05/2013 1808   BILITOT 0.62 09/29/2013 0830   GFRNONAA 62* 12/05/2013 1808   GFRAA 72* 12/05/2013 1808    No results found for: SPEP, UPEP  Lab Results  Component Value Date   WBC 2.5* 03/16/2014   NEUTROABS 0.9*  03/16/2014   HGB 8.6* 03/16/2014   HCT 26.9* 03/16/2014   MCV 88.8 03/16/2014   PLT 210 03/16/2014      Chemistry      Component Value Date/Time   NA 131* 02/24/2014 1726   NA 141 09/29/2013 0830   K 4.7 02/24/2014 1726   K 4.2 09/29/2013 0830   CL 97 02/24/2014 1726   CL 99 06/01/2012 1028   CO2 22 02/24/2014 1726   CO2 24 09/29/2013 0830   BUN 24* 02/24/2014 1726   BUN 26.6* 09/29/2013 0830   CREATININE 0.8 02/24/2014 1726   CREATININE 1.4* 09/29/2013 0830   CREATININE 0.69 07/18/2010 1202   GLU 215* 02/26/2012 1255      Component Value Date/Time   CALCIUM 9.2 02/24/2014 1726   CALCIUM 9.7 09/29/2013 0830   ALKPHOS 99 12/05/2013 1808   ALKPHOS 84 09/29/2013 0830   AST 29 12/05/2013 1808   AST 19 09/29/2013 0830   ALT 26 12/05/2013 1808   ALT 17 09/29/2013 0830    BILITOT 0.5 12/05/2013 1808   BILITOT 0.62 09/29/2013 0830      ASSESSMENT & PLAN:  Myelodysplastic syndrome I had a long discussion with the patient and her daughter. In terms of supportive care, I recommend transfusion and erythropoietin stimulating agents with darbepoetin. I recommend we increase the dose of darbopoeitin to 500 mcg every 2 weeks for hemoglobin less than 10 g. I also recommend blood transfusion threshold at 7 g and  I plan to increase transfusion to 2 units of blood each time. The patient wants to get away from premedication if possible because Benadryl will make her sleepy. Her blood count is not improving.  I recommend addition of prednisone at 10 mg daily and we will reassess in 2 months. She agreed.      Leukopenia This could be related to disease. She is not symptomatic. We'll observe.  She will continue Granix injection.    Anemia in neoplastic disease  As above, we will change her transfusion parameters to 2 units each time for hemoglobin less than 7 g.   the risks, benefit, side effects of prednisone is fully discussed including risk of fluid retention, increased appetite, insomnia, worsening blood sugar control and others and she agreed to proceed.  No orders of the defined types were placed in this encounter.   All questions were answered. The patient knows to call the clinic with any problems, questions or concerns. No barriers to learning was detected. I spent 40 minutes counseling the patient face to face. The total time spent in the appointment was 60 minutes and more than 50% was on counseling and review of test results     [facility], [place], MD 03/16/2014 5:04 PM

## 2014-03-22 ENCOUNTER — Other Ambulatory Visit: Payer: Self-pay | Admitting: Medical Oncology

## 2014-03-22 ENCOUNTER — Other Ambulatory Visit (HOSPITAL_BASED_OUTPATIENT_CLINIC_OR_DEPARTMENT_OTHER): Payer: Medicare Other

## 2014-03-22 ENCOUNTER — Ambulatory Visit (HOSPITAL_BASED_OUTPATIENT_CLINIC_OR_DEPARTMENT_OTHER): Payer: Medicare Other

## 2014-03-22 ENCOUNTER — Other Ambulatory Visit: Payer: Self-pay | Admitting: *Deleted

## 2014-03-22 ENCOUNTER — Other Ambulatory Visit: Payer: Self-pay | Admitting: Hematology and Oncology

## 2014-03-22 VITALS — BP 152/52 | HR 79 | Temp 98.4°F | Resp 18

## 2014-03-22 DIAGNOSIS — D469 Myelodysplastic syndrome, unspecified: Secondary | ICD-10-CM

## 2014-03-22 DIAGNOSIS — D630 Anemia in neoplastic disease: Secondary | ICD-10-CM

## 2014-03-22 LAB — CBC WITH DIFFERENTIAL/PLATELET
HCT: 23.5 % — ABNORMAL LOW (ref 34.8–46.6)
HGB: 7.6 g/dL — ABNORMAL LOW (ref 11.6–15.9)
MCH: 28.7 pg (ref 25.1–34.0)
MCHC: 32.3 g/dL (ref 31.5–36.0)
MCV: 88.7 fL (ref 79.5–101.0)
Platelets: 240 10*3/uL (ref 145–400)
RBC: 2.65 10*6/uL — ABNORMAL LOW (ref 3.70–5.45)
RDW: 16.1 % — AB (ref 11.2–14.5)
WBC: 3.2 10*3/uL — ABNORMAL LOW (ref 3.9–10.3)

## 2014-03-22 LAB — MANUAL DIFFERENTIAL
ALC: 1.1 10*3/uL (ref 0.9–3.3)
ANC (CHCC manual diff): 1.6 10*3/uL (ref 1.5–6.5)
BASOPHIL: 1 % (ref 0–2)
Band Neutrophils: 8 % (ref 0–10)
Blasts: 5 % — ABNORMAL HIGH (ref 0–0)
EOS: 0 % (ref 0–7)
LYMPH: 33 % (ref 14–49)
METAMYELOCYTES PCT: 5 % — AB (ref 0–0)
MONO: 12 % (ref 0–14)
Myelocytes: 6 % — ABNORMAL HIGH (ref 0–0)
Other Cell: 0 % (ref 0–0)
PLT EST: ADEQUATE
PROMYELO: 0 % (ref 0–0)
RBC COMMENTS: NORMAL
SEG: 30 % — ABNORMAL LOW (ref 38–77)
Variant Lymph: 0 % (ref 0–0)
nRBC: 1 % — ABNORMAL HIGH (ref 0–0)

## 2014-03-22 LAB — HOLD TUBE, BLOOD BANK

## 2014-03-22 LAB — PREPARE RBC (CROSSMATCH)

## 2014-03-22 IMAGING — CR DG CHEST 1V PORT
1 series · 1 of 1 positions shown · non-contrast
Comparison: 10/17/2011

CLINICAL DATA: History of AML with fever.

PORTABLE CHEST - 1 VIEW

[AP]
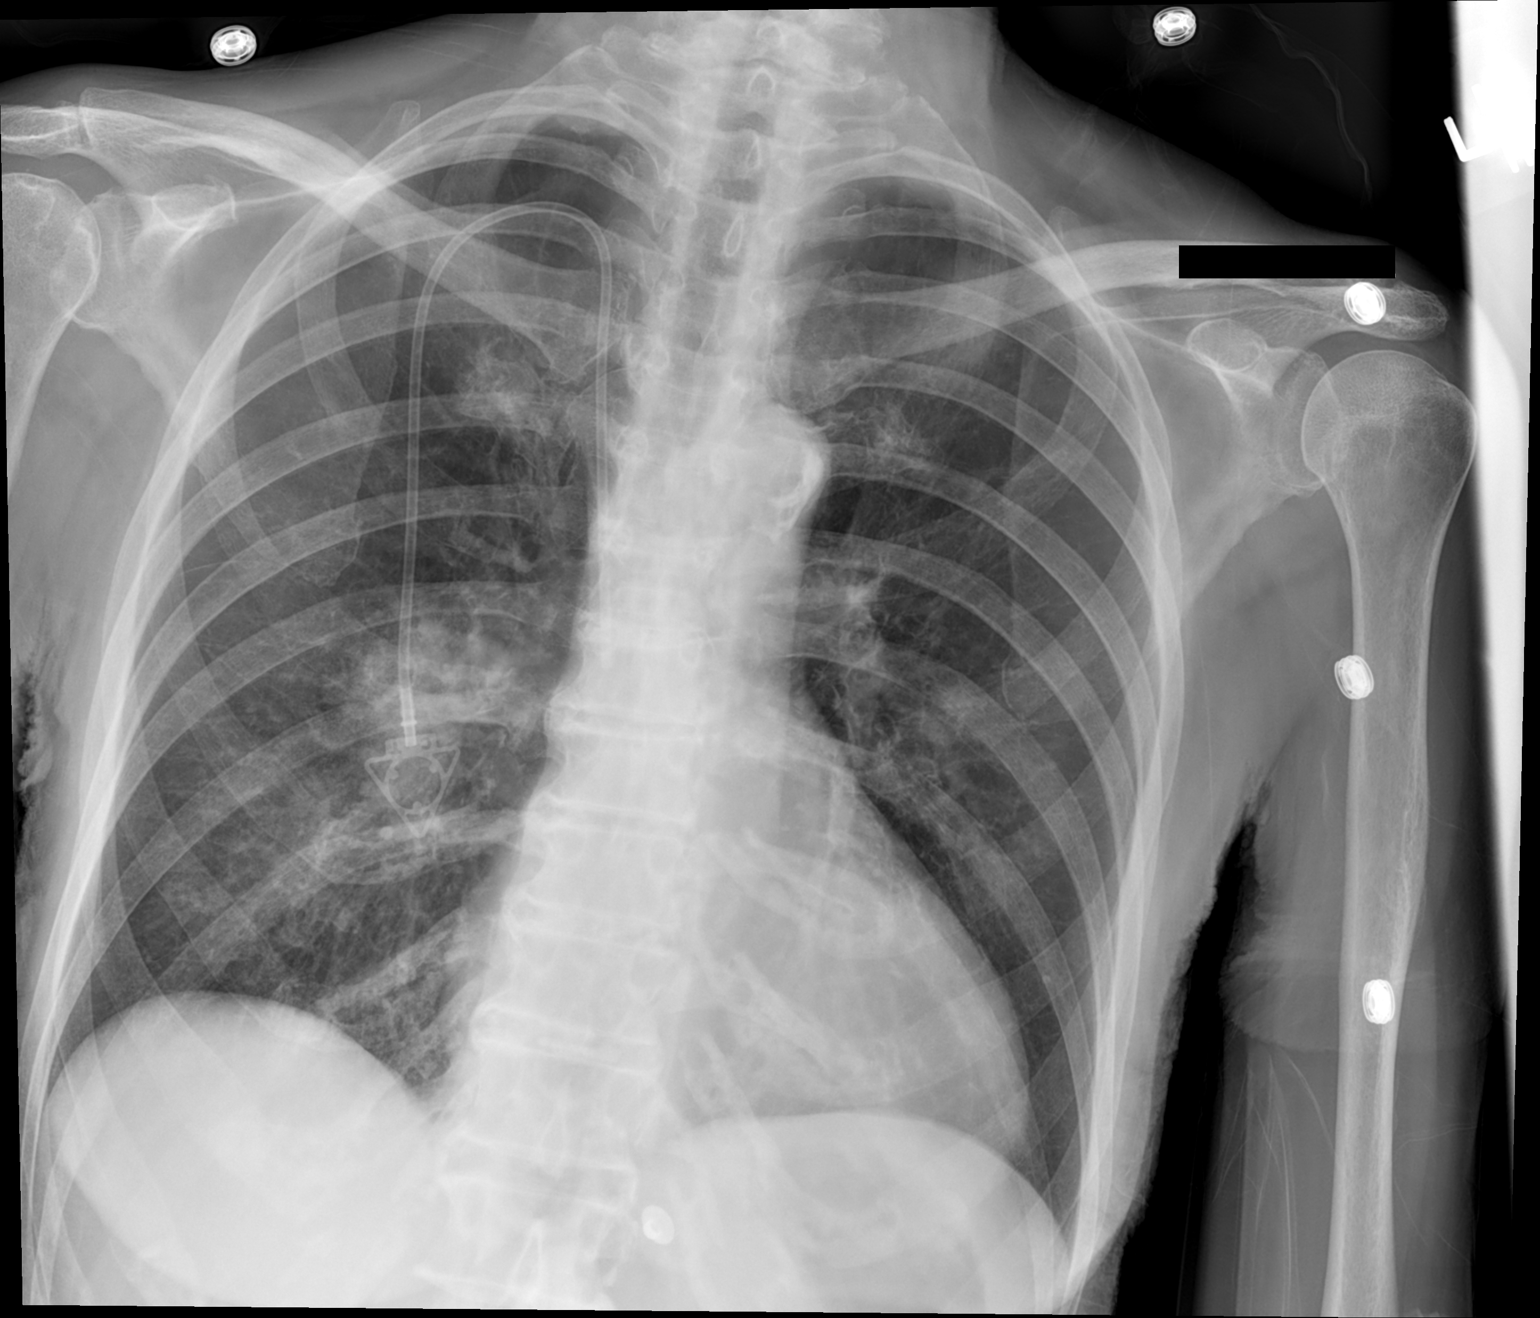

[1 of 1 positions shown; findings below may reference images not displayed]

FINDINGS: Single view of the chest demonstrates a Port-A-Cath.
Port-A-Cath tip is in the SVC region.  There is a new round opacity
in the right hilum.  This opacity roughly measures 4.0 x 2.4 cm.
In addition, there are interstitial densities in the right lower
lung.  Heart size is normal.  Atherosclerotic calcifications of the
aortic arch.
IMPRESSION: New nodular opacity in the right hilum and slightly prominent
interstitial densities at the right lung base.  Findings could
represent multifocal pneumonia but cannot exclude a right hilar
mass or lymphadenopathy.  These findings could better characterized
with a chest CT.

## 2014-03-22 MED ORDER — SODIUM CHLORIDE 0.9 % IJ SOLN
10.0000 mL | INTRAMUSCULAR | Status: AC | PRN
  Administered 2014-03-22: 10 mL
  Filled 2014-03-22: qty 10

## 2014-03-22 MED ORDER — DIPHENHYDRAMINE HCL 25 MG PO CAPS: 25.0000 mg | ORAL_CAPSULE | Freq: Once | ORAL | Status: AC

## 2014-03-22 MED ORDER — SODIUM CHLORIDE 0.9 % IV SOLN
250.0000 mL | Freq: Once | INTRAVENOUS | Status: AC
  Administered 2014-03-22: 250 mL via INTRAVENOUS

## 2014-03-22 MED ORDER — FUROSEMIDE 10 MG/ML IJ SOLN
20.0000 mg | Freq: Once | INTRAMUSCULAR | Status: AC
  Administered 2014-03-22: 20 mg via INTRAVENOUS

## 2014-03-22 MED ORDER — ACETAMINOPHEN 325 MG PO TABS: 650.0000 mg | ORAL_TABLET | Freq: Once | ORAL | Status: AC

## 2014-03-22 MED ORDER — HEPARIN SOD (PORK) LOCK FLUSH 100 UNIT/ML IV SOLN
500.0000 [IU] | Freq: Every day | INTRAVENOUS | Status: AC | PRN
  Administered 2014-03-22: 500 [IU]
  Filled 2014-03-22: qty 5

## 2014-03-22 NOTE — Patient Instructions (Signed)

## 2014-03-23 ENCOUNTER — Ambulatory Visit: Payer: Medicare Other | Admitting: Family Medicine

## 2014-03-23 LAB — TYPE AND SCREEN
ABO/RH(D): AB NEG
ANTIBODY SCREEN: NEGATIVE
Donor AG Type: NEGATIVE
Donor AG Type: NEGATIVE
Unit division: 0
Unit division: 0

## 2014-03-28 ENCOUNTER — Ambulatory Visit (INDEPENDENT_AMBULATORY_CARE_PROVIDER_SITE_OTHER): Payer: Medicare Other | Admitting: Family Medicine

## 2014-03-28 ENCOUNTER — Encounter: Payer: Self-pay | Admitting: Family Medicine

## 2014-03-28 VITALS — BP 142/66 | HR 86 | Temp 98.0°F | Wt 108.0 lb

## 2014-03-28 DIAGNOSIS — E119 Type 2 diabetes mellitus without complications: Secondary | ICD-10-CM

## 2014-03-28 DIAGNOSIS — R600 Localized edema: Secondary | ICD-10-CM

## 2014-03-28 DIAGNOSIS — E0842 Diabetes mellitus due to underlying condition with diabetic polyneuropathy: Secondary | ICD-10-CM

## 2014-03-28 DIAGNOSIS — E1165 Type 2 diabetes mellitus with hyperglycemia: Secondary | ICD-10-CM

## 2014-03-28 NOTE — Progress Notes (Signed)
Pre visit review using our clinic review tool, if applicable. No additional management support is needed unless otherwise documented below in the visit note. 

## 2014-03-28 NOTE — Progress Notes (Signed)
Subjective:    Patient ID: Meredith Rodriguez, female    DOB: 07-03-1925, 80 y.o.   MRN: [id number]  HPI Patient seen for medical follow-up. She has myelodysplastic syndrome and currently getting transfusions about every 2 weeks. Her other medical problems include type 2 diabetes, hypertension, diabetic peripheral neuropathy.  Neuropathy pains have been poorly controlled and we started her on Lyrica last visit. She had not gotten much relief with gabapentin. She is currently on 50 mg twice daily still has 5-6 out of 10 pain. Patient is reluctant to titrate further. She feels overall she is coping fairly well and usually sleeping through the night without severe pain.  Diabetes control has improved with insulin. She is on low-dose insulin and takes short acting mealtime insulin with lunch and supper and most recent A1c 7.6%. No recent hypoglycemia.  Has some chronic bilateral leg edema. No history of CHF. Remains on furosemide 20 mg daily. Leg edema fluctuates considerably but overall stable. No orthopnea. Recent electrolytes normal  Past Medical History  Diagnosis Date  . Hypertension   . Diabetes mellitus   . Hyperkalemia   . IBS (irritable bowel syndrome)   . Hypercholesterolemia   . Cervical radiculopathy   . Anemia   . Cancer     leukemia  . Leukemia    Past Surgical History  Procedure Laterality Date  . Varicose veins      surgey 1959  . Tonsilectomy, adenoidectomy, bilateral myringotomy and tubes    . Esophagogastroduodenoscopy  04/01/2011    Procedure: ESOPHAGOGASTRODUODENOSCOPY (EGD);  Surgeon: Missy Sabins, MD;  Location: [facility] ENDOSCOPY;  Service: Endoscopy;  Laterality: N/A;    reports that she has never smoked. She has never used smokeless tobacco. She reports that she does not drink alcohol or use illicit drugs. family history includes Cancer in her daughter, sister, sister, and sister; Diabetes in her sister; Hypertension in her father; Stroke in her mother. Allergies    Allergen Reactions  . Amoxicillin [Amoxicillin]     Upset stomach  . Cephalexin Nausea Only  . Metformin And Related     Gi problems  . Phenergan [Promethazine Hcl] Other (See Comments)    Unknown   . Procardia [Nifedipine]     dizziness      Review of Systems  Constitutional: Positive for fatigue.  Eyes: Negative for visual disturbance.  Respiratory: Negative for cough, chest tightness, shortness of breath and wheezing.   Cardiovascular: Positive for leg swelling. Negative for chest pain and palpitations.  Gastrointestinal: Negative for nausea and vomiting.  Neurological: Negative for dizziness, seizures, syncope, weakness, light-headedness and headaches.  Psychiatric/Behavioral: Negative for confusion.       Objective:   Physical Exam  Constitutional: She is oriented to person, place, and time.  Alert thin 80 year old female in no distress  HENT:  Mouth/Throat: Oropharynx is clear and moist.  Cardiovascular: Normal rate and regular rhythm.   Pulmonary/Chest: Effort normal and breath sounds normal. No respiratory distress. She has no wheezes. She has no rales.  Musculoskeletal: She exhibits edema.  Trace pitting edema legs bilaterally  Neurological: She is alert and oriented to person, place, and time.          Assessment & Plan:  #1 diabetic neuropathy. Recent initiation of Lyrica. We discussed that we could titrate this further but patient is reluctant to. She'll let us know if pain not well controlled #2 type 2 diabetes. Given her age and comorbidities we are aiming for A1c control  less than 8 and recent 7.6%. Reassess A1c at follow-up in 2 months #3 bilateral leg edema. Suspect largely venous stasis. She is not tolerated support hose in the past. Keep legs elevated. Continue low-dose furosemide. #4 myelodysplastic syndrome. Followed by hematology

## 2014-03-29 ENCOUNTER — Ambulatory Visit (HOSPITAL_COMMUNITY)
Admission: RE | Admit: 2014-03-29 | Discharge: 2014-03-29 | Disposition: A | Discharge status: Home / Self Care | Payer: Medicare Other | Source: Ambulatory Visit | Attending: Oncology | Admitting: Oncology

## 2014-03-29 DIAGNOSIS — D630 Anemia in neoplastic disease: Secondary | ICD-10-CM

## 2014-03-29 DIAGNOSIS — D469 Myelodysplastic syndrome, unspecified: Secondary | ICD-10-CM | POA: Insufficient documentation

## 2014-03-30 ENCOUNTER — Telehealth: Payer: Self-pay | Admitting: Family Medicine

## 2014-03-30 NOTE — Telephone Encounter (Signed)
Patient Information:  Caller Name: Raela  Phone: [phone]  Patient: Meredith Rodriguez, Meredith Rodriguez  Gender: Female  DOB: [birth year]  Age: 80 Years  PCP: Carolann Littler [facility])  Office Follow Up:  Does the office need to follow up with this patient?: Yes  Instructions For The Office: Please call regarding pt request for appointment for 03/31/14  RN Note:  Does not test FBS.  Random blood sugar before lunch 154 at 1230.  Headache rated 6/10.  Advised to see provider today for new headache and weak immune system.  Unable to get transportation to office today. Urgent message sent to office staff for follow up per patient request for future appointment.  Symptoms  Reason For Call & Symptoms: Constant, shooting  headache pt described as "neuritis or neuralgia" in posterior right head.  Reviewed Health History In EMR: Yes  Reviewed Medications In EMR: Yes  Reviewed Allergies In EMR: Yes  Reviewed Surgeries / Procedures: Yes  Date of Onset of Symptoms: 03/28/2014  Treatments Tried: Tylenol  Treatments Tried Worked: Yes  Guideline(s) Used:  Headache  Disposition Per Guideline:   See Today in Office  Reason For Disposition Reached:   New headache and weak immune system (e.g., HIV positive, cancer chemotherapy, chronic steroid treatment)  Advice Given:  Reassurance - Muscle Tension Headache:  The majority of headaches are caused by muscle tension.  Pain Medicines:  For pain relief, you can take either acetaminophen, ibuprofen, or naproxen.  Acetaminophen (e.g., Tylenol):  Regular Strength Tylenol: Take 650 mg (two 325 mg pills) by mouth every 4-6 hours as needed. Each Regular Strength Tylenol pill has 325 mg of acetaminophen.  The most you should take each day is 3,000 mg (10 Regular Strength or 6 Extra Strength pills a day).  Rest:   Lie down in a dark, quiet place and try to relax. Close your eyes and imagine your entire body relaxing.  Call Back If:  Headache lasts longer than 24  hours  You become worse.  Patient Refused Recommendation:  Patient Refused Appt, Patient Requests Appt At Later Date  No transportation to get to office 03/30/14.

## 2014-03-30 NOTE — Telephone Encounter (Signed)
Called and spoke with pt and pt is aware Dr. Elease Hashimoto is out of the office until Monday.  Pt will see Dr. Sherren Mocha on 12.3.15 at 2:30 pm.  Pt states she will have transportation.  Advised pt that if she gets worse she needs to seek medical attention immediately.  Pt verbalized understanding.

## 2014-03-31 ENCOUNTER — Ambulatory Visit (INDEPENDENT_AMBULATORY_CARE_PROVIDER_SITE_OTHER): Payer: Medicare Other | Admitting: Family Medicine

## 2014-03-31 ENCOUNTER — Ambulatory Visit: Payer: Medicare Other | Admitting: Family Medicine

## 2014-03-31 ENCOUNTER — Encounter: Payer: Self-pay | Admitting: Family Medicine

## 2014-03-31 VITALS — BP 110/60 | Temp 98.2°F | Wt 104.0 lb

## 2014-03-31 DIAGNOSIS — F411 Generalized anxiety disorder: Secondary | ICD-10-CM | POA: Insufficient documentation

## 2014-03-31 NOTE — Patient Instructions (Signed)
Tylenol........ 2 tabs 3 times daily as needed for any pain  In the future if you have initiated question problem call and leave a voicemail with Dr. Erick Blinks nurse

## 2014-03-31 NOTE — Progress Notes (Signed)
Pre visit review using our clinic review tool, if applicable. No additional management support is needed unless otherwise documented below in the visit note. 

## 2014-03-31 NOTE — Progress Notes (Signed)
   Subjective:    Patient ID: Meredith Rodriguez, female    DOB: 03-26-1926, 80 y.o.   MRN: [id number]  HPI Sooner is a 80 year old married female who comes in today accompanied by her husband because she is worried  She does have multiple medical problems all blood sugar under fairly good control. She worries about soreness in her neck that comes and goes she worries about it may be something wrong with her teeth and gums she has a lot of worries.  She calls in here and gets the triage nurse who then gets her appointment. She's become a frequent flyer   Review of Systems Review of systems otherwise negative    Objective:   Physical Exam  Well-developed well-nourished female no acute distress vital signs stable she's afebrile oral cavity normal except for usual dental deterioration the 13 notice at age 11      Assessment & Plan:  Gen. Anxiety disorder.........Marland Kitchen Discuss with Dr. Erick Rodriguez nurse that in the future if she has any issue question problem she will call and talk to Dr. Erick Rodriguez nurse directly. I think this will help stop the frequent office visits

## 2014-04-04 ENCOUNTER — Telehealth: Payer: Self-pay | Admitting: Family Medicine

## 2014-04-04 NOTE — Telephone Encounter (Signed)
Pt's daughter(Cathy Despina Pole) calling with concerns related to ov patient had Thursday with Dr. Sherren Mocha.  Pt advised daughter that Dr. Sherren Mocha was rude and unconcerned with her concerns.  States she saw him because PCP was out of the office.  Per daughter, pt states she left with no understanding of what she should do and prefers not to see Dr. Sherren Mocha again.  Requesting PCP to review chart and advise what she should do and if follow up is needed with Dr. Elease Hashimoto.

## 2014-04-04 NOTE — Telephone Encounter (Signed)
If she is having anxiety symptoms, have them follow up and I will be happy to try to help her .

## 2014-04-04 NOTE — Telephone Encounter (Signed)
Returned call to daughter who states pt is complaining of neck pain and is questioning if it is neuralgia.  Appt scheduled with PCP for 04/06/14 at 1045.

## 2014-04-06 ENCOUNTER — Ambulatory Visit: Payer: Medicare Other | Admitting: Family Medicine

## 2014-04-07 ENCOUNTER — Other Ambulatory Visit (HOSPITAL_BASED_OUTPATIENT_CLINIC_OR_DEPARTMENT_OTHER): Payer: Medicare Other

## 2014-04-07 ENCOUNTER — Ambulatory Visit (HOSPITAL_BASED_OUTPATIENT_CLINIC_OR_DEPARTMENT_OTHER): Payer: Medicare Other

## 2014-04-07 DIAGNOSIS — D630 Anemia in neoplastic disease: Secondary | ICD-10-CM

## 2014-04-07 DIAGNOSIS — Z5189 Encounter for other specified aftercare: Secondary | ICD-10-CM

## 2014-04-07 DIAGNOSIS — D469 Myelodysplastic syndrome, unspecified: Secondary | ICD-10-CM

## 2014-04-07 LAB — CBC WITH DIFFERENTIAL/PLATELET
BASO%: 2 % (ref 0.0–2.0)
BASOS ABS: 0.1 10*3/uL (ref 0.0–0.1)
EOS ABS: 0 10*3/uL (ref 0.0–0.5)
EOS%: 0 % (ref 0.0–7.0)
HEMATOCRIT: 26.7 % — AB (ref 34.8–46.6)
HEMOGLOBIN: 8.6 g/dL — AB (ref 11.6–15.9)
LYMPH#: 1.1 10*3/uL (ref 0.9–3.3)
LYMPH%: 35.5 % (ref 14.0–49.7)
MCH: 29.1 pg (ref 25.1–34.0)
MCHC: 32.2 g/dL (ref 31.5–36.0)
MCV: 90.2 fL (ref 79.5–101.0)
MONO#: 0.2 10*3/uL (ref 0.1–0.9)
MONO%: 6.7 % (ref 0.0–14.0)
NEUT#: 1.7 10*3/uL (ref 1.5–6.5)
NEUT%: 55.8 % (ref 38.4–76.8)
Platelets: 174 10*3/uL (ref 145–400)
RBC: 2.96 10*6/uL — ABNORMAL LOW (ref 3.70–5.45)
RDW: 15.7 % — ABNORMAL HIGH (ref 11.2–14.5)
WBC: 3 10*3/uL — ABNORMAL LOW (ref 3.9–10.3)
nRBC: 3 % — ABNORMAL HIGH (ref 0–0)

## 2014-04-07 LAB — HOLD TUBE, BLOOD BANK

## 2014-04-07 MED ORDER — DARBEPOETIN ALFA 500 MCG/ML IJ SOSY
500.0000 ug | PREFILLED_SYRINGE | Freq: Once | INTRAMUSCULAR | Status: AC
  Administered 2014-04-07: 500 ug via SUBCUTANEOUS
  Filled 2014-04-07: qty 1

## 2014-04-07 MED ORDER — TBO-FILGRASTIM 300 MCG/0.5ML ~~LOC~~ SOSY
300.0000 ug | PREFILLED_SYRINGE | Freq: Once | SUBCUTANEOUS | Status: DC
  Filled 2014-04-07: qty 0.5

## 2014-04-07 MED ORDER — TBO-FILGRASTIM 300 MCG/0.5ML ~~LOC~~ SOSY
300.0000 ug | PREFILLED_SYRINGE | Freq: Once | SUBCUTANEOUS | Status: AC
  Administered 2014-04-07: 300 ug via SUBCUTANEOUS
  Filled 2014-04-07: qty 0.5

## 2014-04-07 MED ORDER — FILGRASTIM 300 MCG/0.5ML IJ SOLN: 300.0000 ug | Freq: Once | INTRAMUSCULAR | Status: DC

## 2014-04-08 ENCOUNTER — Ambulatory Visit: Payer: Medicare Other | Admitting: Family Medicine

## 2014-04-18 ENCOUNTER — Ambulatory Visit (HOSPITAL_BASED_OUTPATIENT_CLINIC_OR_DEPARTMENT_OTHER): Payer: Medicare Other

## 2014-04-18 ENCOUNTER — Other Ambulatory Visit: Payer: Self-pay | Admitting: *Deleted

## 2014-04-18 ENCOUNTER — Other Ambulatory Visit: Payer: Self-pay | Admitting: Hematology and Oncology

## 2014-04-18 VITALS — BP 143/44 | HR 69 | Temp 97.5°F | Resp 18

## 2014-04-18 DIAGNOSIS — D630 Anemia in neoplastic disease: Secondary | ICD-10-CM

## 2014-04-18 DIAGNOSIS — D469 Myelodysplastic syndrome, unspecified: Secondary | ICD-10-CM

## 2014-04-18 LAB — CBC WITH DIFFERENTIAL/PLATELET
BASO%: 1 % (ref 0.0–2.0)
Basophils Absolute: 0 10*3/uL (ref 0.0–0.1)
EOS ABS: 0 10*3/uL (ref 0.0–0.5)
EOS%: 0 % (ref 0.0–7.0)
HCT: 18.1 % — ABNORMAL LOW (ref 34.8–46.6)
HGB: 6 g/dL — CL (ref 11.6–15.9)
LYMPH%: 32.6 % (ref 14.0–49.7)
MCH: 28 pg (ref 25.1–34.0)
MCHC: 33.1 g/dL (ref 31.5–36.0)
MCV: 84.6 fL (ref 79.5–101.0)
MONO#: 0.8 10*3/uL (ref 0.1–0.9)
MONO%: 21.5 % — AB (ref 0.0–14.0)
NEUT%: 44.9 % (ref 38.4–76.8)
NEUTROS ABS: 1.8 10*3/uL (ref 1.5–6.5)
Platelets: 80 10*3/uL — ABNORMAL LOW (ref 145–400)
RBC: 2.14 10*6/uL — AB (ref 3.70–5.45)
RDW: 15.2 % — ABNORMAL HIGH (ref 11.2–14.5)
WBC: 3.9 10*3/uL (ref 3.9–10.3)
lymph#: 1.3 10*3/uL (ref 0.9–3.3)

## 2014-04-18 LAB — HOLD TUBE, BLOOD BANK

## 2014-04-18 LAB — PREPARE RBC (CROSSMATCH)

## 2014-04-18 MED ORDER — SODIUM CHLORIDE 0.9 % IV SOLN
250.0000 mL | Freq: Once | INTRAVENOUS | Status: AC
  Administered 2014-04-18: 250 mL via INTRAVENOUS

## 2014-04-18 MED ORDER — ACETAMINOPHEN 325 MG PO TABS
ORAL_TABLET | ORAL | Status: AC
  Filled 2014-04-18: qty 2

## 2014-04-18 MED ORDER — DIPHENHYDRAMINE HCL 25 MG PO CAPS
25.0000 mg | ORAL_CAPSULE | Freq: Once | ORAL | Status: AC
  Administered 2014-04-18: 25 mg via ORAL

## 2014-04-18 MED ORDER — FILGRASTIM 300 MCG/0.5ML IJ SOLN: 300.0000 ug | Freq: Once | INTRAMUSCULAR | Status: DC

## 2014-04-18 MED ORDER — ACETAMINOPHEN 325 MG PO TABS
650.0000 mg | ORAL_TABLET | Freq: Once | ORAL | Status: AC
  Administered 2014-04-18: 650 mg via ORAL

## 2014-04-18 MED ORDER — HEPARIN SOD (PORK) LOCK FLUSH 100 UNIT/ML IV SOLN
250.0000 [IU] | INTRAVENOUS | Status: AC | PRN
  Administered 2014-04-18: 16:00:00
  Filled 2014-04-18: qty 5

## 2014-04-18 MED ORDER — SODIUM CHLORIDE 0.9 % IJ SOLN
10.0000 mL | INTRAMUSCULAR | Status: AC | PRN
  Administered 2014-04-18: 10 mL
  Filled 2014-04-18: qty 10

## 2014-04-18 MED ORDER — DIPHENHYDRAMINE HCL 25 MG PO CAPS
ORAL_CAPSULE | ORAL | Status: AC
  Filled 2014-04-18: qty 1

## 2014-04-18 MED ORDER — DARBEPOETIN ALFA 500 MCG/ML IJ SOSY
500.0000 ug | PREFILLED_SYRINGE | Freq: Once | INTRAMUSCULAR | Status: AC
  Administered 2014-04-18: 500 ug via SUBCUTANEOUS
  Filled 2014-04-18: qty 1

## 2014-04-18 MED ORDER — TBO-FILGRASTIM 300 MCG/0.5ML ~~LOC~~ SOSY
300.0000 ug | PREFILLED_SYRINGE | Freq: Once | SUBCUTANEOUS | Status: DC
  Filled 2014-04-18: qty 0.5

## 2014-04-18 NOTE — Patient Instructions (Signed)

## 2014-04-18 NOTE — Addendum Note (Signed)
Addended by: Margaret Pyle on: 04/18/2014 04:26 PM   Modules accepted: Orders

## 2014-04-19 ENCOUNTER — Ambulatory Visit: Payer: Medicare Other | Admitting: Family Medicine

## 2014-04-19 LAB — TYPE AND SCREEN
ABO/RH(D): AB NEG
ANTIBODY SCREEN: NEGATIVE
DONOR AG TYPE: NEGATIVE
Donor AG Type: NEGATIVE
Unit division: 0
Unit division: 0

## 2014-04-21 ENCOUNTER — Encounter: Payer: Self-pay | Admitting: Family Medicine

## 2014-04-21 ENCOUNTER — Ambulatory Visit (INDEPENDENT_AMBULATORY_CARE_PROVIDER_SITE_OTHER): Payer: Medicare Other | Admitting: Family Medicine

## 2014-04-21 VITALS — BP 128/64 | HR 80 | Temp 98.0°F | Wt 105.0 lb

## 2014-04-21 DIAGNOSIS — Z87898 Personal history of other specified conditions: Secondary | ICD-10-CM

## 2014-04-21 DIAGNOSIS — E119 Type 2 diabetes mellitus without complications: Secondary | ICD-10-CM

## 2014-04-21 LAB — GLUCOSE, POCT (MANUAL RESULT ENTRY): POC GLUCOSE: 225 mg/dL — AB (ref 70–99)

## 2014-04-21 NOTE — Progress Notes (Signed)
Pre visit review using our clinic review tool, if applicable. No additional management support is needed unless otherwise documented below in the visit note. 

## 2014-04-21 NOTE — Progress Notes (Signed)
   Subjective:    Patient ID: Meredith Rodriguez, female    DOB: 1925-11-17, 80 y.o.   MRN: [id number]  HPI   Patient seen initially with complaints of possible fever a few days ago when she was getting transfusion at the cancer center. She cannot confirm that she had any true fever. She denied any chills. She's had occasional dry cough. No dysuria. She is not aware of any fever at home over the past few days.  No abdominal pain and no skin rash.    She does relate her blood sugars had been running elevated between 250 anf 350 over the past several days. Last A1c had improved after initiation of insulin at 7.9%. She is currently taking Lantus 10 units once daily and Humalog 4 units twice daily with meals. No recent dietary changes.  Past Medical History  Diagnosis Date  . Hypertension   . Diabetes mellitus   . Hyperkalemia   . IBS (irritable bowel syndrome)   . Hypercholesterolemia   . Cervical radiculopathy   . Anemia   . Cancer     leukemia  . Leukemia    Past Surgical History  Procedure Laterality Date  . Varicose veins      surgey 1959  . Tonsilectomy, adenoidectomy, bilateral myringotomy and tubes    . Esophagogastroduodenoscopy  04/01/2011    Procedure: ESOPHAGOGASTRODUODENOSCOPY (EGD);  Surgeon: Missy Sabins, MD;  Location: [facility] ENDOSCOPY;  Service: Endoscopy;  Laterality: N/A;    reports that she has never smoked. She has never used smokeless tobacco. She reports that she does not drink alcohol or use illicit drugs. family history includes Cancer in her daughter, sister, sister, and sister; Diabetes in her sister; Hypertension in her father; Stroke in her mother. Allergies  Allergen Reactions  . Amoxicillin [Amoxicillin]     Upset stomach  . Cephalexin Nausea Only  . Metformin And Related     Gi problems  . Phenergan [Promethazine Hcl] Other (See Comments)    Unknown   . Procardia [Nifedipine]     dizziness      Review of Systems  Constitutional: Negative for chills.    HENT: Negative for sinus pressure and sore throat.   Respiratory: Negative for cough.   Cardiovascular: Negative for chest pain.  Gastrointestinal: Negative for nausea, vomiting and diarrhea.  Endocrine: Negative for polydipsia and polyuria.  Genitourinary: Negative for dysuria.  Skin: Negative for rash.  Neurological: Negative for headaches.  Hematological: Negative for adenopathy.       Objective:   Physical Exam  Constitutional: She appears well-developed and well-nourished.  HENT:  Right Ear: External ear normal.  Left Ear: External ear normal.  Mouth/Throat: Oropharynx is clear and moist.  Neck: Neck supple.  Cardiovascular: Normal rate.   Pulmonary/Chest: Effort normal and breath sounds normal. No respiratory distress. She has no wheezes. She has no rales.  Abdominal: Soft. There is no tenderness. There is no guarding.  Lymphadenopathy:    She has no cervical adenopathy.  Neurological: She is alert.  Skin: No rash noted.          Assessment & Plan:  #1 recent reported subjective fever.  None here today and none documented by home readings recently  #2 type 2 diabetes poorly controlled by home readings. Give titration regimen for Lantus. Consider further titration of meal insulin

## 2014-04-21 NOTE — Patient Instructions (Signed)
Titrate Lantus up 2 units every 3 days until fasting blood sugars ranging 70-130 range Check pre-meal blood sugars and if over 200 increase to 6 units of Humalog with meals

## 2014-04-28 ENCOUNTER — Other Ambulatory Visit: Payer: Self-pay | Admitting: Hematology and Oncology

## 2014-04-28 ENCOUNTER — Other Ambulatory Visit (HOSPITAL_BASED_OUTPATIENT_CLINIC_OR_DEPARTMENT_OTHER): Payer: Medicare Other

## 2014-04-28 ENCOUNTER — Ambulatory Visit (HOSPITAL_BASED_OUTPATIENT_CLINIC_OR_DEPARTMENT_OTHER): Payer: Medicare Other

## 2014-04-28 VITALS — BP 141/40 | HR 69 | Temp 98.1°F | Resp 16

## 2014-04-28 DIAGNOSIS — D469 Myelodysplastic syndrome, unspecified: Secondary | ICD-10-CM | POA: Diagnosis not present

## 2014-04-28 LAB — CBC WITH DIFFERENTIAL/PLATELET
BASO%: 1.7 % (ref 0.0–2.0)
Basophils Absolute: 0.1 10*3/uL (ref 0.0–0.1)
EOS ABS: 0 10*3/uL (ref 0.0–0.5)
EOS%: 0 % (ref 0.0–7.0)
HEMATOCRIT: 22.6 % — AB (ref 34.8–46.6)
HEMOGLOBIN: 7.2 g/dL — AB (ref 11.6–15.9)
LYMPH%: 37.6 % (ref 14.0–49.7)
MCH: 27.6 pg (ref 25.1–34.0)
MCHC: 31.9 g/dL (ref 31.5–36.0)
MCV: 86.6 fL (ref 79.5–101.0)
MONO#: 0.6 10*3/uL (ref 0.1–0.9)
MONO%: 20.3 % — ABNORMAL HIGH (ref 0.0–14.0)
NEUT#: 1.2 10*3/uL — ABNORMAL LOW (ref 1.5–6.5)
NEUT%: 40.4 % (ref 38.4–76.8)
RBC: 2.61 10*6/uL — ABNORMAL LOW (ref 3.70–5.45)
RDW: 15.8 % — ABNORMAL HIGH (ref 11.2–14.5)
WBC: 3 10*3/uL — AB (ref 3.9–10.3)
lymph#: 1.1 10*3/uL (ref 0.9–3.3)
nRBC: 4 % — ABNORMAL HIGH (ref 0–0)

## 2014-04-28 LAB — HOLD TUBE, BLOOD BANK

## 2014-04-28 LAB — PREPARE RBC (CROSSMATCH)

## 2014-04-28 MED ORDER — ACETAMINOPHEN 325 MG PO TABS
650.0000 mg | ORAL_TABLET | Freq: Once | ORAL | Status: AC
  Administered 2014-04-28: 650 mg via ORAL

## 2014-04-28 MED ORDER — FUROSEMIDE 10 MG/ML IJ SOLN
20.0000 mg | Freq: Once | INTRAMUSCULAR | Status: AC
  Administered 2014-04-28: 20 mg via INTRAVENOUS

## 2014-04-28 MED ORDER — HEPARIN SOD (PORK) LOCK FLUSH 100 UNIT/ML IV SOLN
500.0000 [IU] | Freq: Every day | INTRAVENOUS | Status: AC | PRN
  Administered 2014-04-28: 500 [IU]
  Filled 2014-04-28: qty 5

## 2014-04-28 MED ORDER — DIPHENHYDRAMINE HCL 25 MG PO CAPS
ORAL_CAPSULE | ORAL | Status: AC
  Filled 2014-04-28: qty 1

## 2014-04-28 MED ORDER — DIPHENHYDRAMINE HCL 25 MG PO CAPS
25.0000 mg | ORAL_CAPSULE | Freq: Once | ORAL | Status: AC
  Administered 2014-04-28: 25 mg via ORAL

## 2014-04-28 MED ORDER — SODIUM CHLORIDE 0.9 % IV SOLN
Freq: Once | INTRAVENOUS | Status: AC
  Administered 2014-04-28: 10:00:00 via INTRAVENOUS

## 2014-04-28 MED ORDER — ACETAMINOPHEN 325 MG PO TABS
ORAL_TABLET | ORAL | Status: AC
  Filled 2014-04-28: qty 2

## 2014-04-28 MED ORDER — SODIUM CHLORIDE 0.9 % IJ SOLN
10.0000 mL | INTRAMUSCULAR | Status: AC | PRN
  Administered 2014-04-28: 10 mL
  Filled 2014-04-28: qty 10

## 2014-04-28 NOTE — Patient Instructions (Signed)

## 2014-04-29 LAB — TYPE AND SCREEN
ABO/RH(D): AB NEG
Antibody Screen: NEGATIVE
DONOR AG TYPE: NEGATIVE
Donor AG Type: NEGATIVE
UNIT DIVISION: 0
Unit division: 0

## 2014-04-30 ENCOUNTER — Ambulatory Visit (HOSPITAL_COMMUNITY)
Admission: RE | Admit: 2014-04-30 | Discharge: 2014-04-30 | Disposition: A | Discharge status: Home / Self Care | Payer: Medicare Other | Source: Ambulatory Visit | Attending: Oncology | Admitting: Oncology

## 2014-04-30 DIAGNOSIS — Z794 Long term (current) use of insulin: Secondary | ICD-10-CM | POA: Insufficient documentation

## 2014-04-30 DIAGNOSIS — Z79899 Other long term (current) drug therapy: Secondary | ICD-10-CM | POA: Insufficient documentation

## 2014-04-30 DIAGNOSIS — E1140 Type 2 diabetes mellitus with diabetic neuropathy, unspecified: Secondary | ICD-10-CM | POA: Insufficient documentation

## 2014-04-30 DIAGNOSIS — D469 Myelodysplastic syndrome, unspecified: Secondary | ICD-10-CM | POA: Insufficient documentation

## 2014-04-30 DIAGNOSIS — D630 Anemia in neoplastic disease: Secondary | ICD-10-CM | POA: Insufficient documentation

## 2014-04-30 DIAGNOSIS — Z7982 Long term (current) use of aspirin: Secondary | ICD-10-CM | POA: Insufficient documentation

## 2014-05-02 ENCOUNTER — Telehealth: Payer: Self-pay | Admitting: Family Medicine

## 2014-05-02 NOTE — Telephone Encounter (Signed)
Verify if she has used Gabapentin previously.

## 2014-05-02 NOTE — Telephone Encounter (Signed)
Pt can not afford lyrica and would like something else call into [facility] [address].

## 2014-05-02 NOTE — Telephone Encounter (Signed)
Let's d/c Lyrica and start Gabapentin 100 mg po TID (#90 with 3 refills).  We might have to titrate up but will have to start low with her age.

## 2014-05-02 NOTE — Telephone Encounter (Signed)
Pt states that she does not think she has but she is willing to take the medication.

## 2014-05-03 MED ORDER — GABAPENTIN 100 MG PO CAPS: 100.0000 mg | ORAL_CAPSULE | Freq: Three times a day (TID) | ORAL | Status: DC

## 2014-05-03 NOTE — Telephone Encounter (Signed)
Rx sent to pharmacy   

## 2014-05-12 ENCOUNTER — Other Ambulatory Visit (HOSPITAL_BASED_OUTPATIENT_CLINIC_OR_DEPARTMENT_OTHER): Payer: Medicare Other

## 2014-05-12 ENCOUNTER — Ambulatory Visit (HOSPITAL_BASED_OUTPATIENT_CLINIC_OR_DEPARTMENT_OTHER): Payer: Medicare Other

## 2014-05-12 ENCOUNTER — Telehealth: Payer: Self-pay | Admitting: Hematology and Oncology

## 2014-05-12 ENCOUNTER — Encounter: Payer: Self-pay | Admitting: Hematology and Oncology

## 2014-05-12 ENCOUNTER — Ambulatory Visit (HOSPITAL_BASED_OUTPATIENT_CLINIC_OR_DEPARTMENT_OTHER): Payer: Medicare Other | Admitting: Hematology and Oncology

## 2014-05-12 VITALS — BP 138/48 | HR 75 | Temp 97.6°F | Resp 18

## 2014-05-12 VITALS — BP 102/49 | HR 84 | Temp 98.4°F | Resp 18 | Ht 66.0 in | Wt 109.5 lb

## 2014-05-12 DIAGNOSIS — D630 Anemia in neoplastic disease: Secondary | ICD-10-CM

## 2014-05-12 DIAGNOSIS — D72819 Decreased white blood cell count, unspecified: Secondary | ICD-10-CM

## 2014-05-12 DIAGNOSIS — E1142 Type 2 diabetes mellitus with diabetic polyneuropathy: Secondary | ICD-10-CM | POA: Diagnosis not present

## 2014-05-12 DIAGNOSIS — Z79899 Other long term (current) drug therapy: Secondary | ICD-10-CM | POA: Diagnosis not present

## 2014-05-12 DIAGNOSIS — D469 Myelodysplastic syndrome, unspecified: Secondary | ICD-10-CM | POA: Diagnosis not present

## 2014-05-12 DIAGNOSIS — Z794 Long term (current) use of insulin: Secondary | ICD-10-CM | POA: Diagnosis not present

## 2014-05-12 DIAGNOSIS — E0841 Diabetes mellitus due to underlying condition with diabetic mononeuropathy: Secondary | ICD-10-CM

## 2014-05-12 DIAGNOSIS — Z7982 Long term (current) use of aspirin: Secondary | ICD-10-CM | POA: Diagnosis not present

## 2014-05-12 DIAGNOSIS — E1140 Type 2 diabetes mellitus with diabetic neuropathy, unspecified: Secondary | ICD-10-CM | POA: Diagnosis not present

## 2014-05-12 LAB — CBC WITH DIFFERENTIAL/PLATELET
HEMATOCRIT: 21.5 % — AB (ref 34.8–46.6)
HGB: 7.1 g/dL — ABNORMAL LOW (ref 11.6–15.9)
MCH: 27.3 pg (ref 25.1–34.0)
MCHC: 33 g/dL (ref 31.5–36.0)
MCV: 82.7 fL (ref 79.5–101.0)
Platelets: 99 10*3/uL — ABNORMAL LOW (ref 145–400)
RBC: 2.6 10*6/uL — AB (ref 3.70–5.45)
RDW: 15.5 % — AB (ref 11.2–14.5)
WBC: 5 10*3/uL (ref 3.9–10.3)

## 2014-05-12 LAB — MANUAL DIFFERENTIAL
ALC: 2.6 10*3/uL (ref 0.9–3.3)
ANC (CHCC MAN DIFF): 1.4 10*3/uL — AB (ref 1.5–6.5)
BLASTS: 12 % — AB (ref 0–0)
Band Neutrophils: 3 % (ref 0–10)
Basophil: 0 % (ref 0–2)
EOS: 0 % (ref 0–7)
LYMPH: 52 % — AB (ref 14–49)
METAMYELOCYTES PCT: 1 % — AB (ref 0–0)
MONO: 8 % (ref 0–14)
MYELOCYTES: 1 % — AB (ref 0–0)
OTHER CELL: 0 % (ref 0–0)
PLT EST: DECREASED
PROMYELO: 0 % (ref 0–0)
SEG: 23 % — ABNORMAL LOW (ref 38–77)
VARIANT LYMPH: 0 % (ref 0–0)
nRBC: 2 % — ABNORMAL HIGH (ref 0–0)

## 2014-05-12 LAB — PREPARE RBC (CROSSMATCH)

## 2014-05-12 LAB — HOLD TUBE, BLOOD BANK

## 2014-05-12 MED ORDER — FUROSEMIDE 10 MG/ML IJ SOLN
INTRAMUSCULAR | Status: AC
  Filled 2014-05-12: qty 2

## 2014-05-12 MED ORDER — FUROSEMIDE 10 MG/ML IJ SOLN
20.0000 mg | Freq: Once | INTRAMUSCULAR | Status: AC
  Administered 2014-05-12: 20 mg via INTRAVENOUS

## 2014-05-12 MED ORDER — SODIUM CHLORIDE 0.9 % IJ SOLN
10.0000 mL | INTRAMUSCULAR | Status: AC | PRN
  Administered 2014-05-12: 10 mL
  Filled 2014-05-12: qty 10

## 2014-05-12 MED ORDER — HEPARIN SOD (PORK) LOCK FLUSH 100 UNIT/ML IV SOLN
500.0000 [IU] | Freq: Every day | INTRAVENOUS | Status: AC | PRN
  Administered 2014-05-12: 500 [IU]
  Filled 2014-05-12: qty 5

## 2014-05-12 MED ORDER — SODIUM CHLORIDE 0.9 % IV SOLN
250.0000 mL | Freq: Once | INTRAVENOUS | Status: AC
  Administered 2014-05-12: 250 mL via INTRAVENOUS

## 2014-05-12 NOTE — Assessment & Plan Note (Signed)
I had a long discussion with the patient and her daughter. In terms of supportive care, I recommend transfusion support only from now on. Overall, she is declining. She has no response to increased dose darbopoeitin to 500 mcg every 2 weeks, the addition of prednisone and G-CSF. I told her family that this is a terminal situation. They should consider palliative care in the future. I also recommend blood transfusion threshold at 8 g and  I plan to increase transfusion to 2 units of blood each time. I will shorten the interval between transfusion from 2 weeks to 10 days. In the future, it may need to be adjusted to weekly. The patient wants to get away from premedication if possible because Benadryl will make her sleepy.

## 2014-05-12 NOTE — Patient Instructions (Signed)

## 2014-05-12 NOTE — Progress Notes (Signed)
[facility] OFFICE PROGRESS NOTE  Patient Care Team: [name], MD as PCP - General  SUMMARY OF ONCOLOGIC HISTORY:  #30 March 2011, she was noted to have anemia with associated thrombocytosis. She became transfusion dependent and was referred to see a hematologist here for further evaluation. The patient has lack of reticulocytosis with associated elevated erythropoietin level of 197. #2 on 05/28/2011, bone marrow aspirate and biopsy was hypercellular with increased myelofibrosis. There were no evidence of increased blasts. #3 The patient was started on erythropoietin stimulating agent with Procrit injections 40,000 units every 2 weeks beginning of 11/20/2011 along with transfusion support. By October 2013, Procrit was deemed ineffective. She was subsequently recommended to consider chemotherapy. #4 On 02/19/2012, repeat bone marrow aspirate and biopsy show persistent myelofibrosis and hypercellular marrow. It was difficult to estimate percentage of blastic cells to do sampling error. By flow cytometry, 10% of abnormal cells were detected. #5 From 02/24/2012 to 05/08/2012, she received 3 cycles of decitabine. The last cycle of treatment was complicated by febrile neutropenia and pneumonia requiring hospitalization. Decision was made to discontinue chemotherapy and to return to clinic for transfusion support only. #6 Due to severe iron overload, she was started on Exjade around September 2014 to May 2015. #7 On 09/09/13: She had repeat bone marrow aspirate and biopsy which showed persistent myelodysplastic syndrome. Aranesp was restarted. #8 From November 2015 to January 2016, she was on increased dose Aranesp, transfusion, prednisone and G-CSF  INTERVAL HISTORY: Please see below for problem oriented charting. She complained of fatigue. She does not bring her hearing aid and cannot hear. She complained of peripheral neuropathy causing pain and discomfort. Family denies recent  infection. The patient denies any recent signs or symptoms of bleeding such as spontaneous epistaxis, hematuria or hematochezia. She has progressive weight loss REVIEW OF SYSTEMS:   Constitutional: Denies fevers, chills  Eyes: Denies blurriness of vision Ears, nose, mouth, throat, and face: Denies mucositis or sore throat Respiratory: Denies cough, dyspnea or wheezes Cardiovascular: Denies palpitation, chest discomfort or lower extremity swelling Gastrointestinal:  Denies nausea, heartburn or change in bowel habits Skin: Denies abnormal skin rashes Lymphatics: Denies new lymphadenopathy or easy bruising Neurological:Denies numbness, tingling or new weaknesses Behavioral/Psych: Mood is stable, no new changes  All other systems were reviewed with the patient and are negative.  I have reviewed the past medical history, past surgical history, social history and family history with the patient and they are unchanged from previous note.  ALLERGIES:  is allergic to amoxicillin; cephalexin; metformin and related; phenergan; and procardia.  MEDICATIONS:  Current Outpatient Prescriptions  Medication Sig Dispense Refill  . amLODipine (NORVASC) 5 MG tablet Take 1 tablet (5 mg total) by mouth daily. 30 tablet 5  . aspirin 81 MG tablet Take 81 mg by mouth daily.    . B-D ULTRAFINE III SHORT PEN 31G X 8 MM MISC USE WITH LANTUS PEN DAILY AS INSTRUCTED 100 each 2  . cholecalciferol (VITAMIN D) 1000 UNITS tablet Take 1,000 Units by mouth daily.    . furosemide (LASIX) 20 MG tablet Take 1 tablet (20 mg total) by mouth daily. 30 tablet 5  . gabapentin (NEURONTIN) 100 MG capsule Take 1 capsule (100 mg total) by mouth 3 (three) times daily. 90 capsule 3  . insulin glargine (LANTUS) 100 UNIT/ML injection Inject 10 Units into the skin at bedtime.    . insulin lispro (HUMALOG KWIKPEN) 100 UNIT/ML SOPN Inject 4 Units into the skin 2 (two) times  daily with a meal. Inject 4 units twice daily into skin if blood  glucose is over 100 at lunch and dinner    . lidocaine-prilocaine (EMLA) cream Apply 1 application topically as needed (to access port).    . metoprolol (LOPRESSOR) 50 MG tablet Take 1 tablet (50 mg total) by mouth 2 (two) times daily. 180 tablet 3  . Omega-3 Fatty Acids (FISH OIL) 1000 MG CAPS Take 1,000 mg by mouth daily.     . ondansetron (ZOFRAN ODT) 4 MG disintegrating tablet Take 1 tablet (4 mg total) by mouth every 8 (eight) hours as needed for nausea or vomiting. 20 tablet 0  . ONETOUCH DELICA LANCETS 16X MISC USE AS DIRECTED. DX. 250.00 100 each 3  . potassium chloride (KLOR-CON 10) 10 MEQ tablet Take 1 tablet (10 mEq total) by mouth daily. 30 tablet 5  . predniSONE (DELTASONE) 10 MG tablet Take 1 tablet (10 mg total) by mouth daily with breakfast. 30 tablet 2  . [DISCONTINUED] glimepiride (AMARYL) 2 MG tablet Take 1 tablet (2 mg total) by mouth every morning. 30 tablet 11   Current Facility-Administered Medications  Medication Dose Route Frequency Provider Last Rate Last Dose  . TDaP (BOOSTRIX) injection 0.5 mL  0.5 mL Intramuscular Once [name], MD       Facility-Administered Medications Ordered in Other Visits  Medication Dose Route Frequency Provider Last Rate Last Dose  . acetaminophen (TYLENOL) tablet 650 mg  650 mg Oral Once [name], MD   650 mg at 03/22/14 1127  . diphenhydrAMINE (BENADRYL) capsule 25 mg  25 mg Oral Once [name], MD   25 mg at 03/22/14 1127  . furosemide (LASIX) injection 20 mg  20 mg Intravenous Once [name], MD      . heparin lock flush 100 unit/mL  500 Units Intracatheter Daily PRN Alajiah Dutkiewicz, MD      . sodium chloride 0.9 % injection 10 mL  10 mL Intracatheter PRN [name], MD        PHYSICAL EXAMINATION: ECOG PERFORMANCE STATUS: 2 - Symptomatic, <50% confined to bed  Filed Vitals:   05/12/14 0932  BP: 102/49  Pulse: 84  Temp: 98.4 F (36.9 C)  Resp: 18   Filed Weights   05/12/14 0932  Weight: 109 lb 8 oz (49.669 kg)     GENERAL:alert, no distress and comfortable. She looks thin and cachectic SKIN: skin color is pale, texture, turgor are normal, no rashes or significant lesions EYES: normal, Conjunctiva are pale and non-injected, sclera clear OROPHARYNX:no exudate, no erythema and lips, buccal mucosa, and tongue normal  NECK: supple, thyroid normal size, non-tender, without nodularity LYMPH:  no palpable lymphadenopathy in the cervical, axillary or inguinal LUNGS: clear to auscultation and percussion with normal breathing effort HEART: regular rate & rhythm and no murmurs and no lower extremity edema ABDOMEN:abdomen soft, non-tender and normal bowel sounds Musculoskeletal:no cyanosis of digits and no clubbing  NEURO: alert & oriented x 3 with fluent speech, no focal motor/sensory deficits  LABORATORY DATA:  I have reviewed the data as listed    Component Value Date/Time   NA 131* 02/24/2014 1726   NA 141 09/29/2013 0830   K 4.7 02/24/2014 1726   K 4.2 09/29/2013 0830   CL 97 02/24/2014 1726   CL 99 06/01/2012 1028   CO2 22 02/24/2014 1726   CO2 24 09/29/2013 0830   GLUCOSE 89 02/24/2014 1726   GLUCOSE 172* 09/29/2013 0830   GLUCOSE 230* 06/01/2012  1028   BUN 24* 02/24/2014 1726   BUN 26.6* 09/29/2013 0830   CREATININE 0.8 02/24/2014 1726   CREATININE 1.4* 09/29/2013 0830   CREATININE 0.69 07/18/2010 1202   CALCIUM 9.2 02/24/2014 1726   CALCIUM 9.7 09/29/2013 0830   PROT 7.0 12/05/2013 1808   PROT 7.3 09/29/2013 0830   ALBUMIN 3.6 12/05/2013 1808   ALBUMIN 3.5 09/29/2013 0830   AST 29 12/05/2013 1808   AST 19 09/29/2013 0830   ALT 26 12/05/2013 1808   ALT 17 09/29/2013 0830   ALKPHOS 99 12/05/2013 1808   ALKPHOS 84 09/29/2013 0830   BILITOT 0.5 12/05/2013 1808   BILITOT 0.62 09/29/2013 0830   GFRNONAA 62* 12/05/2013 1808   GFRAA 72* 12/05/2013 1808    No results found for: SPEP, UPEP  Lab Results  Component Value Date   WBC 5.0 05/12/2014   NEUTROABS 1.2* 04/28/2014   HGB  7.1* 05/12/2014   HCT 21.5* 05/12/2014   MCV 82.7 05/12/2014   PLT 99* 05/12/2014      Chemistry      Component Value Date/Time   NA 131* 02/24/2014 1726   NA 141 09/29/2013 0830   K 4.7 02/24/2014 1726   K 4.2 09/29/2013 0830   CL 97 02/24/2014 1726   CL 99 06/01/2012 1028   CO2 22 02/24/2014 1726   CO2 24 09/29/2013 0830   BUN 24* 02/24/2014 1726   BUN 26.6* 09/29/2013 0830   CREATININE 0.8 02/24/2014 1726   CREATININE 1.4* 09/29/2013 0830   CREATININE 0.69 07/18/2010 1202   GLU 215* 02/26/2012 1255      Component Value Date/Time   CALCIUM 9.2 02/24/2014 1726   CALCIUM 9.7 09/29/2013 0830   ALKPHOS 99 12/05/2013 1808   ALKPHOS 84 09/29/2013 0830   AST 29 12/05/2013 1808   AST 19 09/29/2013 0830   ALT 26 12/05/2013 1808   ALT 17 09/29/2013 0830   BILITOT 0.5 12/05/2013 1808   BILITOT 0.62 09/29/2013 0830     ASSESSMENT & PLAN:  Myelodysplastic syndrome I had a long discussion with the patient and her daughter. In terms of supportive care, I recommend transfusion support only from now on. Overall, she is declining. She has no response to increased dose darbopoeitin to 500 mcg every 2 weeks, the addition of prednisone and G-CSF. I told her family that this is a terminal situation. They should consider palliative care in the future. I also recommend blood transfusion threshold at 8 g and  I plan to increase transfusion to 2 units of blood each time. I will shorten the interval between transfusion from 2 weeks to 10 days. In the future, it may need to be adjusted to weekly. The patient wants to get away from premedication if possible because Benadryl will make her sleepy.    Anemia in neoplastic disease We discussed some of the risks, benefits, and alternatives of blood transfusions. The patient is symptomatic from anemia and the hemoglobin level is critically low.  Some of the side-effects to be expected including risks of transfusion reactions, chills, infection,  syndrome of volume overload and risk of hospitalization from various reasons and the patient is willing to proceed. I will erase a transfusion threshold to 8 g and to transfuse 2 units of blood each time. I will also move the interval between transfusion to 10 days.   Leukopenia She has minimum response to G-CSF. I will discontinue treatment. She is not symptomatic.   Diabetic neuropathy This is moderate to severe.  I recommend a trial of increased dose gabapentin    Orders Placed This Encounter  Procedures  . CBC with Differential    Standing Status: Standing     Number of Occurrences: 99     Standing Expiration Date: 05/13/2015  . Hold Tube, Blood Bank    Standing Status: Standing     Number of Occurrences: 22     Standing Expiration Date: 05/13/2015   All questions were answered. The patient knows to call the clinic with any problems, questions or concerns. No barriers to learning was detected. I spent 30 minutes counseling the patient face to face. The total time spent in the appointment was 40 minutes and more than 50% was on counseling and review of test results     [facility], [place], MD 05/12/2014 2:37 PM

## 2014-05-12 NOTE — Assessment & Plan Note (Signed)
She has minimum response to G-CSF. I will discontinue treatment. She is not symptomatic.

## 2014-05-12 NOTE — Assessment & Plan Note (Signed)
This is moderate to severe. I recommend a trial of increased dose gabapentin

## 2014-05-12 NOTE — Assessment & Plan Note (Signed)
We discussed some of the risks, benefits, and alternatives of blood transfusions. The patient is symptomatic from anemia and the hemoglobin level is critically low.  Some of the side-effects to be expected including risks of transfusion reactions, chills, infection, syndrome of volume overload and risk of hospitalization from various reasons and the patient is willing to proceed. I will erase a transfusion threshold to 8 g and to transfuse 2 units of blood each time. I will also move the interval between transfusion to 10 days.

## 2014-05-12 NOTE — Telephone Encounter (Signed)
gv adn printed appt sched and avs fo rpt for Jan Feb and March...sed added tx.

## 2014-05-13 LAB — TYPE AND SCREEN
ABO/RH(D): AB NEG
ANTIBODY SCREEN: NEGATIVE
DONOR AG TYPE: NEGATIVE
Donor AG Type: NEGATIVE
UNIT DIVISION: 0
Unit division: 0

## 2014-05-16 ENCOUNTER — Telehealth: Payer: Self-pay | Admitting: Family Medicine

## 2014-05-16 NOTE — Telephone Encounter (Signed)
Pt is informed.

## 2014-05-16 NOTE — Telephone Encounter (Signed)
Have them increase Gabapentin 100 mg to 2 capsules three times daily.

## 2014-05-16 NOTE — Telephone Encounter (Signed)
Patient's daughter states gabapentin (NEURONTIN) 100 MG capsule isn't helping and would like to know if it can be increased?  [id number] [place], [place] - [address]

## 2014-05-17 ENCOUNTER — Telehealth: Payer: Self-pay | Admitting: *Deleted

## 2014-05-17 ENCOUNTER — Other Ambulatory Visit: Payer: Self-pay | Admitting: Hematology and Oncology

## 2014-05-17 NOTE — Telephone Encounter (Signed)
Can you call daughter this is compatible with decline. She may be appropriate for hospice if family is ready

## 2014-05-17 NOTE — Telephone Encounter (Signed)
Pt's daughter called stating concern due to ongoing fatigue " and just not picking up like she does normally " post blood transfusion last week.".  Per call - Tye Maryland- is concerned regarding above and known acute leukemia- " if symptoms are normal"  Overall symptoms are  Increased fatigue and sleeping Increased neuropathy Non productive cough Decreased vision  Tye [place] denies any fever.  This RN discussed symptoms and relation to known ongoing leukemia as well as Dr Alvy Bimler concern that pt is continuing to decline.  At present plan is for [facility] to maintain pt's safety - cough may be related to fatigue and this RN instructed Tye Maryland to have mother take deep breaths to expand lungs frequently. Tye [place] understands to call if symptoms progress or new changes occur. Pt is currently scheduled for follow up on 05/23/2014.  THIS NOTE WILL BE SENT TO MD AND NURSE AT DESK FOR ANY ADDITIONAL FOLLOW UP.

## 2014-05-17 NOTE — Telephone Encounter (Signed)
Referral made to [facility].  Spoke w/ Tye [place] and gave her daughter's Jaclynn Major contact information.

## 2014-05-17 NOTE — Telephone Encounter (Signed)
Discussed pt's condition with her daughter and discussed Hospice home care.  She is open to speaking w/ Hospice about their services.  She wants to speak to her mother and the rest of her family but is ok w/ Dr. Alvy Bimler making the referral.

## 2014-05-18 ENCOUNTER — Telehealth: Payer: Self-pay | Admitting: *Deleted

## 2014-05-18 NOTE — Telephone Encounter (Signed)
Juliann Pulse RN reports Hospice received referral yesterday.  Spoke with daughter.  "Daughter is not ready yet for Hospice."  Will call Hospice when she is ready which may be in a few days.

## 2014-05-21 ENCOUNTER — Emergency Department (HOSPITAL_COMMUNITY): Payer: Medicare Other

## 2014-05-21 ENCOUNTER — Inpatient Hospital Stay (HOSPITAL_COMMUNITY)
Admission: EM | Admit: 2014-05-21 | Discharge: 2014-05-24 | DRG: 194 | Disposition: A | Discharge status: Home / Self Care | Payer: Medicare Other | Attending: Internal Medicine | Admitting: Internal Medicine

## 2014-05-21 ENCOUNTER — Encounter (HOSPITAL_COMMUNITY): Payer: Self-pay | Admitting: Emergency Medicine

## 2014-05-21 DIAGNOSIS — I10 Essential (primary) hypertension: Secondary | ICD-10-CM | POA: Diagnosis present

## 2014-05-21 DIAGNOSIS — N179 Acute kidney failure, unspecified: Secondary | ICD-10-CM | POA: Diagnosis not present

## 2014-05-21 DIAGNOSIS — D469 Myelodysplastic syndrome, unspecified: Secondary | ICD-10-CM | POA: Diagnosis not present

## 2014-05-21 DIAGNOSIS — K589 Irritable bowel syndrome without diarrhea: Secondary | ICD-10-CM | POA: Diagnosis not present

## 2014-05-21 DIAGNOSIS — Z515 Encounter for palliative care: Secondary | ICD-10-CM

## 2014-05-21 DIAGNOSIS — R531 Weakness: Secondary | ICD-10-CM | POA: Diagnosis not present

## 2014-05-21 DIAGNOSIS — E119 Type 2 diabetes mellitus without complications: Secondary | ICD-10-CM

## 2014-05-21 DIAGNOSIS — H9190 Unspecified hearing loss, unspecified ear: Secondary | ICD-10-CM | POA: Diagnosis not present

## 2014-05-21 DIAGNOSIS — M792 Neuralgia and neuritis, unspecified: Secondary | ICD-10-CM | POA: Insufficient documentation

## 2014-05-21 DIAGNOSIS — Z881 Allergy status to other antibiotic agents status: Secondary | ICD-10-CM

## 2014-05-21 DIAGNOSIS — D899 Disorder involving the immune mechanism, unspecified: Secondary | ICD-10-CM | POA: Diagnosis not present

## 2014-05-21 DIAGNOSIS — Z794 Long term (current) use of insulin: Secondary | ICD-10-CM | POA: Diagnosis not present

## 2014-05-21 DIAGNOSIS — Z66 Do not resuscitate: Secondary | ICD-10-CM | POA: Diagnosis present

## 2014-05-21 DIAGNOSIS — J189 Pneumonia, unspecified organism: Principal | ICD-10-CM | POA: Diagnosis present

## 2014-05-21 DIAGNOSIS — Y95 Nosocomial condition: Secondary | ICD-10-CM | POA: Diagnosis present

## 2014-05-21 DIAGNOSIS — Z888 Allergy status to other drugs, medicaments and biological substances status: Secondary | ICD-10-CM

## 2014-05-21 DIAGNOSIS — Z7982 Long term (current) use of aspirin: Secondary | ICD-10-CM

## 2014-05-21 DIAGNOSIS — D630 Anemia in neoplastic disease: Secondary | ICD-10-CM | POA: Diagnosis present

## 2014-05-21 DIAGNOSIS — Z856 Personal history of leukemia: Secondary | ICD-10-CM | POA: Diagnosis not present

## 2014-05-21 DIAGNOSIS — E780 Pure hypercholesterolemia: Secondary | ICD-10-CM | POA: Diagnosis not present

## 2014-05-21 DIAGNOSIS — E1140 Type 2 diabetes mellitus with diabetic neuropathy, unspecified: Secondary | ICD-10-CM | POA: Diagnosis not present

## 2014-05-21 DIAGNOSIS — R05 Cough: Secondary | ICD-10-CM | POA: Diagnosis present

## 2014-05-21 DIAGNOSIS — J90 Pleural effusion, not elsewhere classified: Secondary | ICD-10-CM | POA: Diagnosis not present

## 2014-05-21 LAB — CBC
HEMATOCRIT: 21.1 % — AB (ref 36.0–46.0)
Hemoglobin: 7.2 g/dL — ABNORMAL LOW (ref 12.0–15.0)
MCH: 28 pg (ref 26.0–34.0)
MCHC: 34.1 g/dL (ref 30.0–36.0)
MCV: 82.1 fL (ref 78.0–100.0)
Platelets: 23 10*3/uL — CL (ref 150–400)
RBC: 2.57 MIL/uL — ABNORMAL LOW (ref 3.87–5.11)
RDW: 16 % — ABNORMAL HIGH (ref 11.5–15.5)
WBC: 5.4 10*3/uL (ref 4.0–10.5)

## 2014-05-21 LAB — TSH: TSH: 14.918 u[IU]/mL — ABNORMAL HIGH (ref 0.350–4.500)

## 2014-05-21 LAB — HEPATIC FUNCTION PANEL
ALBUMIN: 2.6 g/dL — AB (ref 3.5–5.2)
ALK PHOS: 52 U/L (ref 39–117)
ALT: 55 U/L — ABNORMAL HIGH (ref 0–35)
AST: 66 U/L — AB (ref 0–37)
Bilirubin, Direct: 0.7 mg/dL — ABNORMAL HIGH (ref 0.0–0.5)
Indirect Bilirubin: 0.7 mg/dL (ref 0.3–0.9)
Total Bilirubin: 1.4 mg/dL — ABNORMAL HIGH (ref 0.3–1.2)
Total Protein: 5.8 g/dL — ABNORMAL LOW (ref 6.0–8.3)

## 2014-05-21 LAB — BASIC METABOLIC PANEL
ANION GAP: 9 (ref 5–15)
BUN: 68 mg/dL — ABNORMAL HIGH (ref 6–23)
CO2: 25 mmol/L (ref 19–32)
Calcium: 8.7 mg/dL (ref 8.4–10.5)
Chloride: 95 mmol/L — ABNORMAL LOW (ref 96–112)
Creatinine, Ser: 1.13 mg/dL — ABNORMAL HIGH (ref 0.50–1.10)
GFR calc Af Amer: 50 mL/min — ABNORMAL LOW (ref >90)
GFR calc non Af Amer: 43 mL/min — ABNORMAL LOW (ref >90)
GLUCOSE: 223 mg/dL — AB (ref 70–99)
Potassium: 4.8 mmol/L (ref 3.5–5.1)
Sodium: 129 mmol/L — ABNORMAL LOW (ref 135–145)

## 2014-05-21 LAB — GLUCOSE, CAPILLARY
GLUCOSE-CAPILLARY: 186 mg/dL — AB (ref 70–99)
GLUCOSE-CAPILLARY: 229 mg/dL — AB (ref 70–99)

## 2014-05-21 LAB — MAGNESIUM: Magnesium: 2.1 mg/dL (ref 1.5–2.5)

## 2014-05-21 LAB — CBG MONITORING, ED: Glucose-Capillary: 184 mg/dL — ABNORMAL HIGH (ref 70–99)

## 2014-05-21 LAB — BRAIN NATRIURETIC PEPTIDE: B Natriuretic Peptide: 1025.6 pg/mL — ABNORMAL HIGH (ref 0.0–100.0)

## 2014-05-21 LAB — PREPARE RBC (CROSSMATCH)

## 2014-05-21 MED ORDER — SODIUM CHLORIDE 0.9 % IJ SOLN
3.0000 mL | Freq: Two times a day (BID) | INTRAMUSCULAR | Status: DC
  Administered 2014-05-22: 3 mL via INTRAVENOUS

## 2014-05-21 MED ORDER — SODIUM CHLORIDE 0.9 % IV SOLN: Freq: Once | INTRAVENOUS | Status: DC

## 2014-05-21 MED ORDER — CEFEPIME HCL 1 G IJ SOLR
1.0000 g | INTRAMUSCULAR | Status: AC
  Administered 2014-05-21: 1 g via INTRAVENOUS
  Filled 2014-05-21 (×2): qty 1

## 2014-05-21 MED ORDER — ACETAMINOPHEN 325 MG PO TABS
650.0000 mg | ORAL_TABLET | Freq: Four times a day (QID) | ORAL | Status: DC | PRN
  Administered 2014-05-23 – 2014-05-24 (×3): 650 mg via ORAL
  Filled 2014-05-21 (×3): qty 2

## 2014-05-21 MED ORDER — DEXTROSE 5 % IV SOLN
1.0000 g | INTRAVENOUS | Status: DC
  Administered 2014-05-22: 1 g via INTRAVENOUS
  Filled 2014-05-21 (×2): qty 1

## 2014-05-21 MED ORDER — LEVALBUTEROL HCL 0.63 MG/3ML IN NEBU: 0.6300 mg | INHALATION_SOLUTION | Freq: Four times a day (QID) | RESPIRATORY_TRACT | Status: DC | PRN

## 2014-05-21 MED ORDER — VANCOMYCIN HCL IN DEXTROSE 1-5 GM/200ML-% IV SOLN
1000.0000 mg | INTRAVENOUS | Status: AC
  Filled 2014-05-21: qty 200

## 2014-05-21 MED ORDER — PANTOPRAZOLE SODIUM 40 MG PO TBEC
40.0000 mg | DELAYED_RELEASE_TABLET | Freq: Every day | ORAL | Status: DC
  Administered 2014-05-22 – 2014-05-24 (×3): 40 mg via ORAL
  Filled 2014-05-21 (×3): qty 1

## 2014-05-21 MED ORDER — SODIUM CHLORIDE 0.9 % IV SOLN: 250.0000 mL | INTRAVENOUS | Status: DC | PRN

## 2014-05-21 MED ORDER — ACETAMINOPHEN 650 MG RE SUPP: 650.0000 mg | Freq: Four times a day (QID) | RECTAL | Status: DC | PRN

## 2014-05-21 MED ORDER — VANCOMYCIN HCL 500 MG IV SOLR
500.0000 mg | INTRAVENOUS | Status: DC
  Administered 2014-05-22: 500 mg via INTRAVENOUS
  Filled 2014-05-21 (×2): qty 500

## 2014-05-21 MED ORDER — ONDANSETRON HCL 4 MG PO TABS: 4.0000 mg | ORAL_TABLET | Freq: Four times a day (QID) | ORAL | Status: DC | PRN

## 2014-05-21 MED ORDER — INSULIN ASPART 100 UNIT/ML ~~LOC~~ SOLN
0.0000 [IU] | Freq: Three times a day (TID) | SUBCUTANEOUS | Status: DC
  Administered 2014-05-21: 3 [IU] via SUBCUTANEOUS
  Administered 2014-05-22: 11 [IU] via SUBCUTANEOUS
  Administered 2014-05-22: 2 [IU] via SUBCUTANEOUS
  Administered 2014-05-22: 3 [IU] via SUBCUTANEOUS

## 2014-05-21 MED ORDER — ONDANSETRON HCL 4 MG/2ML IJ SOLN
4.0000 mg | Freq: Four times a day (QID) | INTRAMUSCULAR | Status: DC | PRN
Start: 2014-05-21 — End: 2014-05-24

## 2014-05-21 MED ORDER — SODIUM CHLORIDE 0.9 % IJ SOLN: 3.0000 mL | Freq: Two times a day (BID) | INTRAMUSCULAR | Status: DC

## 2014-05-21 MED ORDER — SODIUM CHLORIDE 0.9 % IV BOLUS (SEPSIS)
1000.0000 mL | Freq: Once | INTRAVENOUS | Status: AC
  Administered 2014-05-21: 1000 mL via INTRAVENOUS

## 2014-05-21 MED ORDER — SODIUM CHLORIDE 0.9 % IJ SOLN: 3.0000 mL | INTRAMUSCULAR | Status: DC | PRN

## 2014-05-21 NOTE — ED Notes (Signed)
Bed: WA09 Expected date:  Expected time:  Means of arrival:  Comments: Hold for triage 2 

## 2014-05-21 NOTE — ED Provider Notes (Signed)
CSN: [id number]     Arrival date & time 05/21/14  1242 History   First MD Initiated Contact with Patient 05/21/14 1329     Chief Complaint  Patient presents with  . Weakness   Meredith Rodriguez is a 81 y.o. female with terminal myelodysplastic syndrome, anemia in neoplastic disease, diabetes, hypertension and cervical radiculopathy presents the emergency department complaining of blurry vision for the past month, a cough that is worse in the last 3 days, nasal congestion and feeling fatigued. Patient has terminal myelodysplastic syndrome and is being treated palliatively with blood transfusions. Patient's last blood transfusion was 05/12/2014. They report her next transfusion is in 2 days. Patient reports she's been eating and drinking okay. Her oncologist is Dr. Alvy Bimler. She denies nose bleeds or gum bleeding. She denies fevers, chills, abdominal pain, nausea, vomiting, constipation, diarrhea, shortness of breath, chest pain, or palpitations.   (Consider location/radiation/quality/duration/timing/severity/associated sxs/prior Treatment) HPI  Past Medical History  Diagnosis Date  . Hypertension   . Diabetes mellitus   . Hyperkalemia   . IBS (irritable bowel syndrome)   . Hypercholesterolemia   . Cervical radiculopathy   . Anemia   . Cancer     leukemia  . Leukemia    Past Surgical History  Procedure Laterality Date  . Varicose veins      surgey 1959  . Tonsilectomy, adenoidectomy, bilateral myringotomy and tubes    . Esophagogastroduodenoscopy  04/01/2011    Procedure: ESOPHAGOGASTRODUODENOSCOPY (EGD);  Surgeon: Missy Sabins, MD;  Location: [facility] ENDOSCOPY;  Service: Endoscopy;  Laterality: N/A;   Family History  Problem Relation Age of Onset  . Stroke Mother   . Hypertension Father   . Cancer Sister   . Cancer Sister   . Diabetes Sister   . Cancer Sister     colon cancer  . Cancer Daughter     breast cancer   History  Substance Use Topics  . Smoking status: Never Smoker   .  Smokeless tobacco: Never Used  . Alcohol Use: No   OB History    No data available     Review of Systems  Constitutional: Negative for fever, chills and appetite change.  HENT: Positive for congestion. Negative for ear pain, nosebleeds and sore throat.   Eyes: Positive for visual disturbance. Negative for pain, redness and itching.  Respiratory: Positive for cough. Negative for shortness of breath and wheezing.   Cardiovascular: Negative for chest pain, palpitations and leg swelling.  Gastrointestinal: Negative for nausea, vomiting, abdominal pain, diarrhea and blood in stool.  Genitourinary: Negative for dysuria and hematuria.  Musculoskeletal: Negative for back pain and neck pain.  Skin: Negative for rash and wound.  Neurological: Positive for weakness. Negative for light-headedness, numbness and headaches.      Allergies  Amoxicillin; Cephalexin; Metformin and related; Phenergan; and Procardia  Home Medications   Prior to Admission medications   Medication Sig Start Date End Date Taking? Authorizing Provider  aspirin 81 MG tablet Take 81 mg by mouth daily.   Yes Historical Provider, MD  cholecalciferol (VITAMIN D) 1000 UNITS tablet Take 1,000 Units by mouth daily.   Yes Historical Provider, MD  furosemide (LASIX) 20 MG tablet Take 1 tablet (20 mg total) by mouth daily. 12/09/13  Yes [name], MD  gabapentin (NEURONTIN) 100 MG capsule Take 2 capsules by mouth 3 times daily.   Yes Historical Provider, MD  insulin glargine (LANTUS) 100 UNIT/ML injection Inject 10 Units into the skin every morning.  Yes Historical Provider, MD  insulin lispro (HUMALOG KWIKPEN) 100 UNIT/ML SOPN Inject 4 Units into the skin 2 (two) times daily with a meal. Inject 4 units twice daily into skin if blood glucose is over 100 at lunch and dinner   Yes Historical Provider, MD  lidocaine-prilocaine (EMLA) cream Apply 1 application topically as needed (to access port).   Yes Historical Provider, MD   potassium chloride (KLOR-CON 10) 10 MEQ tablet Take 1 tablet (10 mEq total) by mouth daily. 12/09/13  Yes [name], MD  amLODipine (NORVASC) 5 MG tablet Take 1 tablet (5 mg total) by mouth daily. Patient not taking: Reported on 05/21/2014 12/09/13   [name], MD  B-D ULTRAFINE III SHORT PEN 31G X 8 MM MISC USE WITH LANTUS PEN DAILY AS INSTRUCTED 01/10/14   [name], MD  metoprolol (LOPRESSOR) 50 MG tablet Take 1 tablet (50 mg total) by mouth 2 (two) times daily. 06/09/13   [name], MD  ondansetron (ZOFRAN ODT) 4 MG disintegrating tablet Take 1 tablet (4 mg total) by mouth every 8 (eight) hours as needed for nausea or vomiting. Patient not taking: Reported on 05/21/2014 02/24/14   [name], MD  [facility] DELICA LANCETS 55D MISC USE AS DIRECTED. DX. 250.00 01/27/14   [name], MD  predniSONE (DELTASONE) 10 MG tablet Take 1 tablet (10 mg total) by mouth daily with breakfast. Patient not taking: Reported on 05/21/2014 03/16/14   Ni Gorsuch, MD   BP 107/42 mmHg  Pulse 73  Temp(Src) 98 F (36.7 C) (Oral)  Resp 17  SpO2 87% Physical Exam  Constitutional: She is oriented to person, place, and time. She appears well-developed and well-nourished. No distress.  HENT:  Head: Normocephalic and atraumatic.  Right Ear: External ear normal.  Left Ear: External ear normal.  Nose: Nose normal.  Mouth/Throat: Oropharynx is clear and moist. No oropharyngeal exudate.  Eyes: Conjunctivae and EOM are normal. Pupils are equal, round, and reactive to light. Right eye exhibits no discharge. Left eye exhibits no discharge. No scleral icterus.  Neck: Neck supple.  Cardiovascular: Normal rate, regular rhythm, normal heart sounds and intact distal pulses.  Exam reveals no gallop and no friction rub.   No murmur heard. Pulmonary/Chest: Effort normal and breath sounds normal. No respiratory distress. She has no wheezes. She has no rales.  Abdominal: Soft. Bowel sounds  are normal. She exhibits no distension and no mass. There is no tenderness. There is no rebound and no guarding.  Musculoskeletal: She exhibits no edema.  Lymphadenopathy:    She has no cervical adenopathy.  Neurological: She is alert and oriented to person, place, and time. No cranial nerve deficit. Coordination normal.  Skin: Skin is warm and dry. No rash noted. She is not diaphoretic. No erythema. No pallor.  Psychiatric: She has a normal mood and affect. Her behavior is normal.  Nursing note and vitals reviewed.   ED Course  Procedures (including critical care time) Labs Review Labs Reviewed  CBC - Abnormal; Notable for the following:    RBC 2.57 (*)    Hemoglobin 7.2 (*)    HCT 21.1 (*)    RDW 16.0 (*)    Platelets 23 (*)    All other components within normal limits  BASIC METABOLIC PANEL - Abnormal; Notable for the following:    Sodium 129 (*)    Chloride 95 (*)    Glucose, Bld 223 (*)    BUN 68 (*)  Creatinine, Ser 1.13 (*)    GFR calc non Af Amer 43 (*)    GFR calc Af Amer 50 (*)    All other components within normal limits  CBG MONITORING, ED - Abnormal; Notable for the following:    Glucose-Capillary 184 (*)    All other components within normal limits  CULTURE, BLOOD (ROUTINE X 2)  CULTURE, BLOOD (ROUTINE X 2)  BRAIN NATRIURETIC PEPTIDE    Imaging Review Dg Chest 2 View  05/21/2014   CLINICAL DATA:  Shortness of breath. Cough and congestion. 12/05/2013.  EXAM: CHEST  2 VIEW  COMPARISON:  02/10/2013  FINDINGS: There is a right chest wall port a catheter with tip in the projection of the SVC. Normal heart size. Calcified atherosclerotic plaque involves the thoracic aorta. There are bilateral pleural effusions right greater than left. Multi focal bilateral airspace opacities are identified right greater than left. The visualized osseous structures are unremarkable.  IMPRESSION: 1. Bilateral airspace opacities, right greater than the left. In the acute setting this  may represent multifocal pneumonia and or pulmonary edema. 2. Pleural effusions.   Electronically Signed   By: Kerby Moors M.D.   On: 05/21/2014 15:05     EKG Interpretation None      Filed Vitals:   05/21/14 1257 05/21/14 1337  BP: 107/42   Pulse: 73   Temp: 98 F (36.7 C)   TempSrc: Oral   Resp: 17   SpO2: 95% 87%     MDM   Meds given in ED:  Medications  sodium chloride 0.9 % bolus 1,000 mL (not administered)  vancomycin (VANCOCIN) IVPB 1000 mg/200 mL premix (not administered)  ceFEPIme (MAXIPIME) 1 g in dextrose 5 % 50 mL IVPB (not administered)  vancomycin (VANCOCIN) 500 mg in sodium chloride 0.9 % 100 mL IVPB (not administered)  ceFEPIme (MAXIPIME) 1 g in dextrose 5 % 50 mL IVPB (not administered)    New Prescriptions   No medications on file    Final diagnoses:  None   This is an 81 year old female with terminal myelodysplastic syndrome and anemia of neoplastic disease who presents to the emergency room complaining of blurred vision, weakness and cough worse in the past 3 days. Patient is afebrile and nontoxic appearing. Patient has been short of breath however her oxygen saturation is slightly low. Patient is placed on 2 L oxygen via nasal cannula in the ED. Oxygen saturation is improved. Patient received a liter bolus in the ED. Patient's chest x-ray indicated multifocal pneumonia as well as bilateral pleural effusions. Patient started on cefepime and Vanco for healthcare acquired pneumonia. Spoke to this patient about admission and she would like to be admitted. [facility] hospitalist consult for admission and accepted for admission.  This patient was discussed with and evaluated by Dr. Regenia Skeeter who agrees with assessment and plan.      Hanley Hays, PA-C 05/21/14 [place], MD 05/22/14 [phone]

## 2014-05-21 NOTE — H&P (Addendum)
[facility] History and Physical  Meredith Rodriguez AOZ:[id number] DOB: 04-24-1926 DOA: 05/21/2014  Referring physician:   PCP: [name], MD   Chief Complaint: Weakness HPI:  81 y.o. female with terminal myelodysplastic syndrome, anemia in neoplastic disease, diabetes, hypertension and cervical radiculopathy presents the emergency department complaining of blurry vision for the past month, a cough that is worse in the last 3 days, nasal congestion and feeling fatigued. Hg 7.2 , she had 2 units PRBC on 1/14 , without any significant improvement in her symptoms. Patient has  end-stage myelodysplastic syndrome, not responsive to darbopoeitin, prednisone and G-CSF. Meredith Lark, MD patient's oncologist strongly recommends palliative care. At this time the patient has not made up her mind about going on hospice.  In the ER the chest x-ray showed bilateral airspace opacities suspicious for multifocal pneumonia and pleural effusions    Review of Systems: negative for the following  Constitutional: Denies fever, chills, diaphoresis, appetite change and fatigue.  HENT: Positive for congestion. Negative for ear pain, nosebleeds and sore throat.  Eyes: Positive for visual disturbance. Negative for pain, redness and itching.  Respiratory: Positive for cough. Negative for shortness of breath and wheezing. Cardiovascular: Denies chest pain, palpitations and leg swelling.  Gastrointestinal: Denies nausea, vomiting, abdominal pain, diarrhea, constipation, blood in stool and abdominal distention.  Genitourinary: Denies dysuria, urgency, frequency, hematuria, flank pain and difficulty urinating.  Musculoskeletal: Denies myalgias, back pain, joint swelling, arthralgias and gait problem.  Skin: Denies pallor, rash and wound.  Neurological: Denies dizziness, seizures, syncope, weakness, light-headedness, numbness and headaches.  Hematological: Denies adenopathy. Easy bruising, personal or family  bleeding history  Psychiatric/Behavioral: Denies suicidal ideation, mood changes, confusion, nervousness, sleep disturbance and agitation       Past Medical History  Diagnosis Date  . Hypertension   . Diabetes mellitus   . Hyperkalemia   . IBS (irritable bowel syndrome)   . Hypercholesterolemia   . Cervical radiculopathy   . Anemia   . Cancer     leukemia  . Leukemia      Past Surgical History  Procedure Laterality Date  . Varicose veins      surgey 1959  . Tonsilectomy, adenoidectomy, bilateral myringotomy and tubes    . Esophagogastroduodenoscopy  04/01/2011    Procedure: ESOPHAGOGASTRODUODENOSCOPY (EGD);  Surgeon: Missy Sabins, MD;  Location: [facility] ENDOSCOPY;  Service: Endoscopy;  Laterality: N/A;      Social History:  reports that she has never smoked. She has never used smokeless tobacco. She reports that she does not drink alcohol or use illicit drugs.    Allergies  Allergen Reactions  . Amoxicillin [Amoxicillin]     Upset stomach  . Cephalexin Nausea Only  . Metformin And Related     Gi problems  . Phenergan [Promethazine Hcl] Other (See Comments)    Unknown   . Procardia [Nifedipine]     dizziness    Family History  Problem Relation Age of Onset  . Stroke Mother   . Hypertension Father   . Cancer Sister   . Cancer Sister   . Diabetes Sister   . Cancer Sister     colon cancer  . Cancer Daughter     breast cancer                       Prior to Admission medications   Medication Sig Start Date End Date Taking? Authorizing Provider  aspirin 81 MG tablet Take 81 mg by mouth daily.  Yes Historical Provider, MD  cholecalciferol (VITAMIN D) 1000 UNITS tablet Take 1,000 Units by mouth daily.   Yes Historical Provider, MD  furosemide (LASIX) 20 MG tablet Take 1 tablet (20 mg total) by mouth daily. 12/09/13  Yes [name], MD  gabapentin (NEURONTIN) 100 MG capsule Take 2 capsules by mouth 3 times daily.   Yes Historical Provider, MD  insulin  glargine (LANTUS) 100 UNIT/ML injection Inject 10 Units into the skin every morning.    Yes Historical Provider, MD  insulin lispro (HUMALOG KWIKPEN) 100 UNIT/ML SOPN Inject 4 Units into the skin 2 (two) times daily with a meal. Inject 4 units twice daily into skin if blood glucose is over 100 at lunch and dinner   Yes Historical Provider, MD  lidocaine-prilocaine (EMLA) cream Apply 1 application topically as needed (to access port).   Yes Historical Provider, MD  potassium chloride (KLOR-CON 10) 10 MEQ tablet Take 1 tablet (10 mEq total) by mouth daily. 12/09/13  Yes [name], MD  amLODipine (NORVASC) 5 MG tablet Take 1 tablet (5 mg total) by mouth daily. Patient not taking: Reported on 05/21/2014 12/09/13   [name], MD  B-D ULTRAFINE III SHORT PEN 31G X 8 MM MISC USE WITH LANTUS PEN DAILY AS INSTRUCTED 01/10/14   [name], MD  metoprolol (LOPRESSOR) 50 MG tablet Take 1 tablet (50 mg total) by mouth 2 (two) times daily. 06/09/13   [name], MD  ondansetron (ZOFRAN ODT) 4 MG disintegrating tablet Take 1 tablet (4 mg total) by mouth every 8 (eight) hours as needed for nausea or vomiting. Patient not taking: Reported on 05/21/2014 02/24/14   [name], MD  [facility] DELICA LANCETS 56L MISC USE AS DIRECTED. DX. 250.00 01/27/14   [name], MD  predniSONE (DELTASONE) 10 MG tablet Take 1 tablet (10 mg total) by mouth daily with breakfast. Patient not taking: Reported on 05/21/2014 03/16/14   Meredith Lark, MD     Physical Exam: Filed Vitals:   05/21/14 1257 05/21/14 1337  BP: 107/42   Pulse: 73   Temp: 98 F (36.7 C)   TempSrc: Oral   Resp: 17   SpO2: 95% 87%     GENERAL:alert, no distress and comfortable. She looks thin and cachectic SKIN: skin color is pale, texture, turgor are normal, no rashes or significant lesions EYES: normal, Conjunctiva are pale and non-injected, sclera clear OROPHARYNX:no exudate, no erythema and lips, buccal mucosa, and  tongue normal  NECK: supple, thyroid normal size, non-tender, without nodularity LYMPH: no palpable lymphadenopathy in the cervical, axillary or inguinal LUNGS: clear to auscultation and percussion with normal breathing effort HEART: regular rate & rhythm and no murmurs and no lower extremity edema ABDOMEN:abdomen soft, non-tender and normal bowel sounds Musculoskeletal:no cyanosis of digits and no clubbing  NEURO: alert & oriented x 3 with fluent speech, no focal motor/sensory deficits Psychiatric: Normal mood and affect. speech and behavior is normal. Judgment and thought content normal. Cognition and memory are normal.       Labs on Admission:    Basic Metabolic Panel:  Recent Labs Lab 05/21/14 1327  NA 129*  K 4.8  CL 95*  CO2 25  GLUCOSE 223*  BUN 68*  CREATININE 1.13*  CALCIUM 8.7   Liver Function Tests: No results for input(s): AST, ALT, ALKPHOS, BILITOT, PROT, ALBUMIN in the last 168 hours. No results for input(s): LIPASE, AMYLASE in the last 168 hours. No results for input(s): AMMONIA  in the last 168 hours. CBC:  Recent Labs Lab 05/21/14 1327  WBC 5.4  HGB 7.2*  HCT 21.1*  MCV 82.1  PLT 23*   Cardiac Enzymes: No results for input(s): CKTOTAL, CKMB, CKMBINDEX, TROPONINI in the last 168 hours.  BNP (last 3 results)  Recent Labs  12/05/13 1816  PROBNP 3277.0*      CBG:  Recent Labs Lab 05/21/14 1309  GLUCAP 184*    Radiological Exams on Admission: Dg Chest 2 View  05/21/2014   CLINICAL DATA:  Shortness of breath. Cough and congestion. 12/05/2013.  EXAM: CHEST  2 VIEW  COMPARISON:  02/10/2013  FINDINGS: There is a right chest wall port a catheter with tip in the projection of the SVC. Normal heart size. Calcified atherosclerotic plaque involves the thoracic aorta. There are bilateral pleural effusions right greater than left. Multi focal bilateral airspace opacities are identified right greater than left. The visualized osseous structures  are unremarkable.  IMPRESSION: 1. Bilateral airspace opacities, right greater than the left. In the acute setting this may represent multifocal pneumonia and or pulmonary edema. 2. Pleural effusions.   Electronically Signed   By: Kerby Moors M.D.   On: 05/21/2014 15:05    EKG: Independently reviewed.  Assessment/Plan Active Problems:   * No active hospital problems. *    Multifocal pneumonia, HCAP Patient is immunocompromised we'll treat her with broad-spectrum antibiotics namely cefepime, vancomycin Blood culture 2 Patient otherwise hemodynamically stable  Terminal myelodysplastic syndrome Oncology recommend blood transfusion threshold at 8 g, transfusion to 2 units of blood each time every 10 days to weekly. Transfuse 2 units of packed red blood cells today Will order palliative care consultation Patient is a full code   Diabetic neuropathy Continue gabapentin   DM2.INSULIN DEPENDENT  Cont lantus, SSI  HTN  Hold norvasc , BP soft  Acute kidney injury-prerenal We'll start the patient on IV hydration   Code Status:   full Family Communication: bedside Disposition Plan: admit   Time spent: 70 mins   [facility] Pager [phone]  If 7PM-7AM, please contact night-coverage [email or web address] Password [email or web address] 05/21/2014, 3:50 PM

## 2014-05-21 NOTE — Progress Notes (Signed)
ANTIBIOTIC CONSULT NOTE - INITIAL  Pharmacy Consult for Vancomycin / Cefepime Indication: HCAP  Allergies  Allergen Reactions  . Amoxicillin [Amoxicillin]     Upset stomach  . Cephalexin Nausea Only  . Metformin And Related     Gi problems  . Phenergan [Promethazine Hcl] Other (See Comments)    Unknown   . Procardia [Nifedipine]     dizziness    Patient Measurements:   Adjusted Body Weight:   Vital Signs: Temp: 98 F (36.7 C) (01/23 1257) Temp Source: Oral (01/23 1257) BP: 107/42 mmHg (01/23 1257) Pulse Rate: 73 (01/23 1257) Intake/Output from previous day:   Intake/Output from this shift:    Labs:  Recent Labs  05/21/14 1327  WBC 5.4  HGB 7.2*  PLT 23*  CREATININE 1.13*   Estimated Creatinine Clearance: 28 mL/min (by C-G formula based on Cr of 1.13). No results for input(s): VANCOTROUGH, VANCOPEAK, VANCORANDOM, GENTTROUGH, GENTPEAK, GENTRANDOM, TOBRATROUGH, TOBRAPEAK, TOBRARND, AMIKACINPEAK, AMIKACINTROU, AMIKACIN in the last 72 hours.   Microbiology: No results found for this or any previous visit (from the past 720 hour(s)).  Medical History: Past Medical History  Diagnosis Date  . Hypertension   . Diabetes mellitus   . Hyperkalemia   . IBS (irritable bowel syndrome)   . Hypercholesterolemia   . Cervical radiculopathy   . Anemia   . Cancer     leukemia  . Leukemia    Assessment: 47 yoF with COPD on 4L home 02, terminal MDS, anemia, IBS, DM, and cervical radiculopathy presents with CC of persistent cough and SOB. Failed outpt antibiotics last month for cough.  Pharmacy consulted to dose Vancomycin and Cefepime for HCAP.    1/23 >> Vanc  >> 1/23 >> Cefepime >>    Tmax: Afebrile WBCs: 5.4K Renal: SCr 1.13, CrCl ~28 CG (N40)  1/23 blood: ordered  Goal of Therapy:  Vancomycin trough level 15-20 mcg/ml  Plan:  Vancomycin 1g IV x 1, then 500mg  IV q24h Cefepime 1g IV q24h F/u renal fxn, cultures, clinical course, vanc trough as  warranted  Ralene Bathe, PharmD, BCPS 05/21/2014, 3:38 PM  Pager: [phone]

## 2014-05-21 NOTE — ED Notes (Signed)
Per pt/family states she has been weak, blurred vision-had transfusion on the 14th but "has not bounced back like she normally does"

## 2014-05-21 NOTE — ED Notes (Signed)
Receiving nurse not available, she will be asked to call back for report by the unit secretary.

## 2014-05-22 DIAGNOSIS — R531 Weakness: Secondary | ICD-10-CM | POA: Insufficient documentation

## 2014-05-22 DIAGNOSIS — M792 Neuralgia and neuritis, unspecified: Secondary | ICD-10-CM | POA: Insufficient documentation

## 2014-05-22 DIAGNOSIS — Z515 Encounter for palliative care: Secondary | ICD-10-CM | POA: Insufficient documentation

## 2014-05-22 LAB — COMPREHENSIVE METABOLIC PANEL
ALBUMIN: 2.3 g/dL — AB (ref 3.5–5.2)
ALK PHOS: 44 U/L (ref 39–117)
ALT: 39 U/L — ABNORMAL HIGH (ref 0–35)
AST: 28 U/L (ref 0–37)
Anion gap: 7 (ref 5–15)
BUN: 51 mg/dL — ABNORMAL HIGH (ref 6–23)
CO2: 25 mmol/L (ref 19–32)
CREATININE: 0.9 mg/dL (ref 0.50–1.10)
Calcium: 8.2 mg/dL — ABNORMAL LOW (ref 8.4–10.5)
Chloride: 100 mmol/L (ref 96–112)
GFR calc non Af Amer: 56 mL/min — ABNORMAL LOW (ref >90)
GFR, EST AFRICAN AMERICAN: 65 mL/min — AB (ref >90)
Glucose, Bld: 229 mg/dL — ABNORMAL HIGH (ref 70–99)
POTASSIUM: 4.3 mmol/L (ref 3.5–5.1)
Sodium: 132 mmol/L — ABNORMAL LOW (ref 135–145)
TOTAL PROTEIN: 5.3 g/dL — AB (ref 6.0–8.3)
Total Bilirubin: 1.4 mg/dL — ABNORMAL HIGH (ref 0.3–1.2)

## 2014-05-22 LAB — URINALYSIS, ROUTINE W REFLEX MICROSCOPIC
Bilirubin Urine: NEGATIVE
Glucose, UA: NEGATIVE mg/dL
Hgb urine dipstick: NEGATIVE
Ketones, ur: NEGATIVE mg/dL
Leukocytes, UA: NEGATIVE
Nitrite: NEGATIVE
Protein, ur: NEGATIVE mg/dL
Specific Gravity, Urine: 1.021 (ref 1.005–1.030)
Urobilinogen, UA: 1 mg/dL (ref 0.0–1.0)
pH: 5.5 (ref 5.0–8.0)

## 2014-05-22 LAB — CBC
HCT: 29.3 % — ABNORMAL LOW (ref 36.0–46.0)
Hemoglobin: 10.2 g/dL — ABNORMAL LOW (ref 12.0–15.0)
MCH: 28.9 pg (ref 26.0–34.0)
MCHC: 34.8 g/dL (ref 30.0–36.0)
MCV: 83 fL (ref 78.0–100.0)
Platelets: 33 10*3/uL — ABNORMAL LOW (ref 150–400)
RBC: 3.53 MIL/uL — AB (ref 3.87–5.11)
RDW: 15.3 % (ref 11.5–15.5)
WBC: 3.3 10*3/uL — ABNORMAL LOW (ref 4.0–10.5)

## 2014-05-22 LAB — GLUCOSE, CAPILLARY
GLUCOSE-CAPILLARY: 314 mg/dL — AB (ref 70–99)
GLUCOSE-CAPILLARY: 177 mg/dL — AB (ref 70–99)
Glucose-Capillary: 198 mg/dL — ABNORMAL HIGH (ref 70–99)
Glucose-Capillary: 106 mg/dL — ABNORMAL HIGH (ref 70–99)

## 2014-05-22 MED ORDER — INSULIN GLARGINE 100 UNIT/ML ~~LOC~~ SOLN
20.0000 [IU] | Freq: Every morning | SUBCUTANEOUS | Status: DC
  Administered 2014-05-23: 20 [IU] via SUBCUTANEOUS
  Filled 2014-05-22: qty 0.2

## 2014-05-22 MED ORDER — SODIUM CHLORIDE 0.9 % IJ SOLN
10.0000 mL | Freq: Two times a day (BID) | INTRAMUSCULAR | Status: DC
  Administered 2014-05-22: 10 mL

## 2014-05-22 MED ORDER — METOPROLOL TARTRATE 12.5 MG HALF TABLET
12.5000 mg | ORAL_TABLET | Freq: Two times a day (BID) | ORAL | Status: DC
  Administered 2014-05-22 – 2014-05-24 (×5): 12.5 mg via ORAL
  Filled 2014-05-22 (×6): qty 1

## 2014-05-22 MED ORDER — ASPIRIN EC 81 MG PO TBEC
81.0000 mg | DELAYED_RELEASE_TABLET | Freq: Every day | ORAL | Status: DC
  Administered 2014-05-22: 81 mg via ORAL
  Filled 2014-05-22: qty 1

## 2014-05-22 MED ORDER — GABAPENTIN 100 MG PO CAPS
100.0000 mg | ORAL_CAPSULE | Freq: Two times a day (BID) | ORAL | Status: DC
  Administered 2014-05-22 – 2014-05-24 (×5): 100 mg via ORAL
  Filled 2014-05-22 (×6): qty 1

## 2014-05-22 MED ORDER — INSULIN GLARGINE 100 UNIT/ML ~~LOC~~ SOLN
10.0000 [IU] | Freq: Every morning | SUBCUTANEOUS | Status: DC
Start: 2014-05-22 — End: 2014-05-22
  Administered 2014-05-22: 10 [IU] via SUBCUTANEOUS
  Filled 2014-05-22: qty 0.1

## 2014-05-22 MED ORDER — INSULIN GLARGINE 100 UNIT/ML ~~LOC~~ SOLN
10.0000 [IU] | Freq: Once | SUBCUTANEOUS | Status: AC
  Administered 2014-05-22: 10 [IU] via SUBCUTANEOUS
  Filled 2014-05-22: qty 0.1

## 2014-05-22 MED ORDER — DULOXETINE HCL 30 MG PO CPEP
30.0000 mg | ORAL_CAPSULE | Freq: Every day | ORAL | Status: DC
  Administered 2014-05-22 – 2014-05-24 (×3): 30 mg via ORAL
  Filled 2014-05-22 (×3): qty 1

## 2014-05-22 MED ORDER — SODIUM CHLORIDE 0.9 % IJ SOLN
10.0000 mL | INTRAMUSCULAR | Status: DC | PRN
  Administered 2014-05-22 – 2014-05-24 (×2): 10 mL
  Filled 2014-05-22 (×2): qty 40

## 2014-05-22 MED ORDER — POTASSIUM CHLORIDE ER 10 MEQ PO TBCR
10.0000 meq | EXTENDED_RELEASE_TABLET | Freq: Every day | ORAL | Status: DC
  Administered 2014-05-22 – 2014-05-24 (×3): 10 meq via ORAL
  Filled 2014-05-22 (×3): qty 1

## 2014-05-22 NOTE — Consult Note (Signed)
Patient ID:Meredith Rodriguez      DOB: [birth year]      UJW:[id number]     Consult Note from the Palliative Medicine Team at [facility] Requested by: Dr Allyson Sabal     PCP: [name], MD Reason for Consultation: Hospice Eligability    Phone [phone]  Assessment /Recommendations: 81 yo female with MDS and transfusion dependent anemia who presented with fatigue, cough, congestion presumed HCAP. Palliative consult for hospice eligibility   1.  Code Status: I changed to DNR today based off discussion with family.  Meredith Rodriguez has living will which they will bring in.  They have discussed this issue in past and this reflects prior discussions.   2. GOC: Patient of Dr Alvy Bimler in hematology clinic.  Met with patient and daughter Meredith Rodriguez today.  Meredith Rodriguez is hard of hearing and prefers all difficult conversations go through her daughter who also goes to clinic with her. It is the same preference when she is seen in clinic. Ultimately they are already looking into hospice care and were to meet with hospice at their home on Thursday (they believe [facility]?).  They are hopeful of getting her through this infection, and today we discussed the likely question they will face regarding hospice care and transfusions. I suspect transfusion will have some set goal and decision point of stopping before hospice will enroll. She did not respond well to last transfusion from a symptomatic standpoint, though has remained markedly functional through her disease.  They are aware it may be getting close to time where transfusions stop making sense.  I think if they can work out a plan with hospice of when transfusions will stop, she should be able to be enrolled. Ultimately this will be up to hospice medical director however. Their goal of this hospitalization is to potentially get her through infection and back home.  If not home by Thursday, would have case management notify [facility] to reschedule appointment to meet with hospice.   Hopefully can be transitioned to oral abx and home in few days.   3. Symptom Management:   1. Neuropathic Pain- on gabapentin. Will add cymbalta to see if this helps 2. Weakness- very functional at home. Still doing light house work and preparing meals even when blood counts are low. Rarely uses assistive device to ambulate.  Will order PT consult to try and prevent her from having major setback in strength/mobility while hospitalized.   4. Psychosocial/Spiritual: 5 children. Lives at home with her husband. Daughter recently passed away in 03-27-2023 which has been a struggle for her.    Will sign off, as goals seem set and hospice eligibility already enquired by Dr Alvy Bimler and referral made. Feel free to contact me if additional questions or concerns.    Brief HPI: 81 yo female with PMHx of MDA and transfusion dependent anemia who presented to [facility] ED yesterday from home with blurry vision, cough, congestion, fatigue.  She was scheduled to have routine transfusion on Monday but found to be anemic with Hgb 7.2 and transfused here. She is following with Dr Alvy Bimler in hematology clinic and her transfusion plan was increased from q2weeks to q10days due to increasing requirement. She has not responded to attempts at erythropoietic agents with prednisone and G-CSF.  Dr Alvy Bimler had also made referral for hospice care at home.  She was found to have possible PNA in ED and is currently being treated with broad spectrum abx.  She is hard of hearing and  prefers detailed conversations about her care occur through her daughter.  She had been at home prior to admission and still able to do light house work and prepare here meals. She does not report that she has significant fatigue doing these things normally. Recently lost a daughter to cancer in November which has saddened her but denies overt depression. Most bothersome symptom is neurpathic pain in her extremities.  No other acute complaints at this time.  Family  concerned she has not been eating/drinking as much this past week.    ROS: Full ROS negative unless otherwise mentioned above.     PMH:  Past Medical History  Diagnosis Date  . Hypertension   . Diabetes mellitus   . Hyperkalemia   . IBS (irritable bowel syndrome)   . Hypercholesterolemia   . Cervical radiculopathy   . Anemia   . Cancer     leukemia  . Leukemia      PSH: Past Surgical History  Procedure Laterality Date  . Varicose veins      surgey 1959  . Tonsilectomy, adenoidectomy, bilateral myringotomy and tubes    . Esophagogastroduodenoscopy  04/01/2011    Procedure: ESOPHAGOGASTRODUODENOSCOPY (EGD);  Surgeon: Missy Sabins, MD;  Location: [facility] ENDOSCOPY;  Service: Endoscopy;  Laterality: N/A;   I have reviewed the [place] and [facility] and  If appropriate update it with new information. Allergies  Allergen Reactions  . Amoxicillin [Amoxicillin]     Upset stomach  . Cephalexin Nausea Only  . Metformin And Related     Gi problems  . Phenergan [Promethazine Hcl] Other (See Comments)    Unknown   . Procardia [Nifedipine]     dizziness   Scheduled Meds: . sodium chloride   Intravenous Once  . aspirin EC  81 mg Oral Daily  . ceFEPime (MAXIPIME) IV  1 g Intravenous Q24H  . DULoxetine  30 mg Oral Daily  . gabapentin  100 mg Oral BID  . insulin aspart  0-15 Units Subcutaneous TID WC  . insulin glargine  10 Units Subcutaneous q morning - 10a  . metoprolol  12.5 mg Oral BID  . pantoprazole  40 mg Oral Daily  . potassium chloride  10 mEq Oral Daily  . sodium chloride  10-40 mL Intracatheter Q12H  . sodium chloride  3 mL Intravenous Q12H  . sodium chloride  3 mL Intravenous Q12H  . vancomycin  500 mg Intravenous Q24H  . vancomycin  1,000 mg Intravenous STAT   Continuous Infusions:  PRN Meds:.sodium chloride, acetaminophen **OR** acetaminophen, levalbuterol, ondansetron **OR** ondansetron (ZOFRAN) IV, sodium chloride, sodium chloride    BP 127/43 mmHg  Pulse 93  Temp(Src)  98.8 F (37.1 C) (Oral)  Resp 20  Wt 52.4 kg (115 lb 8.3 oz)  SpO2 94%   PPS: 70   Intake/Output Summary (Last 24 hours) at 05/22/14 1102 Last data filed at 05/22/14 0900  Gross per 24 hour  Intake   1190 ml  Output      0 ml  Net   1190 ml    Physical Exam:  General: Alert, NAD HEENT: [place], sclera anicteric Neck: supple Chest:   CTAB CVS: RRR Abdomen: soft, NT,ND Ext: no edema Neuro:hard of hearing  Labs: CBC    Component Value Date/Time   WBC 3.3* 05/22/2014 0806   WBC 5.0 05/12/2014 0910   RBC 3.53* 05/22/2014 0806   RBC 2.60* 05/12/2014 0910   RBC 1.82* 03/30/2011 0050   HGB 10.2* 05/22/2014 6754  HGB 7.1* 05/12/2014 0910   HCT 29.3* 05/22/2014 0806   HCT 21.5* 05/12/2014 0910   PLT 33* 05/22/2014 0806   PLT 99* 05/12/2014 0910   MCV 83.0 05/22/2014 0806   MCV 82.7 05/12/2014 0910   MCH 28.9 05/22/2014 0806   MCH 27.3 05/12/2014 0910   MCHC 34.8 05/22/2014 0806   MCHC 33.0 05/12/2014 0910   RDW 15.3 05/22/2014 0806   RDW 15.5* 05/12/2014 0910   LYMPHSABS 1.1 04/28/2014 0856   LYMPHSABS 0.8 12/05/2013 1808   MONOABS 0.6 04/28/2014 0856   MONOABS 0.2 12/05/2013 1808   EOSABS 0.0 04/28/2014 0856   EOSABS 0.0 12/05/2013 1808   BASOSABS 0.1 04/28/2014 0856   BASOSABS 0.0 12/05/2013 1808    BMET    Component Value Date/Time   NA 132* 05/22/2014 0806   NA 141 09/29/2013 0830   K 4.3 05/22/2014 0806   K 4.2 09/29/2013 0830   CL 100 05/22/2014 0806   CL 99 06/01/2012 1028   CO2 25 05/22/2014 0806   CO2 24 09/29/2013 0830   GLUCOSE 229* 05/22/2014 0806   GLUCOSE 172* 09/29/2013 0830   GLUCOSE 230* 06/01/2012 1028   BUN 51* 05/22/2014 0806   BUN 26.6* 09/29/2013 0830   CREATININE 0.90 05/22/2014 0806   CREATININE 1.4* 09/29/2013 0830   CREATININE 0.69 07/18/2010 1202   CALCIUM 8.2* 05/22/2014 0806   CALCIUM 9.7 09/29/2013 0830   GFRNONAA 56* 05/22/2014 0806   GFRAA 65* 05/22/2014 0806    CMP     Component Value Date/Time   NA 132*  05/22/2014 0806   NA 141 09/29/2013 0830   K 4.3 05/22/2014 0806   K 4.2 09/29/2013 0830   CL 100 05/22/2014 0806   CL 99 06/01/2012 1028   CO2 25 05/22/2014 0806   CO2 24 09/29/2013 0830   GLUCOSE 229* 05/22/2014 0806   GLUCOSE 172* 09/29/2013 0830   GLUCOSE 230* 06/01/2012 1028   BUN 51* 05/22/2014 0806   BUN 26.6* 09/29/2013 0830   CREATININE 0.90 05/22/2014 0806   CREATININE 1.4* 09/29/2013 0830   CREATININE 0.69 07/18/2010 1202   CALCIUM 8.2* 05/22/2014 0806   CALCIUM 9.7 09/29/2013 0830   PROT 5.3* 05/22/2014 0806   PROT 7.3 09/29/2013 0830   ALBUMIN 2.3* 05/22/2014 0806   ALBUMIN 3.5 09/29/2013 0830   AST 28 05/22/2014 0806   AST 19 09/29/2013 0830   ALT 39* 05/22/2014 0806   ALT 17 09/29/2013 0830   ALKPHOS 44 05/22/2014 0806   ALKPHOS 84 09/29/2013 0830   BILITOT 1.4* 05/22/2014 0806   BILITOT 0.62 09/29/2013 0830   GFRNONAA 56* 05/22/2014 0806   GFRAA 65* 05/22/2014 0806   1/23 CXR IMPRESSION: 1. Bilateral airspace opacities, right greater than the left. In the acute setting this may represent multifocal pneumonia and or pulmonary edema. 2. Pleural effusions.   Total Time: 60 minutes Greater than 50%  of this time was spent counseling and coordinating care related to the above assessment and plan.  Doran Clay D.O. Palliative Medicine Team at [facility]  Pager: [phone] Team Phone: [phone]

## 2014-05-22 NOTE — Progress Notes (Addendum)
[facility] PROGRESS NOTE  Meredith Rodriguez ZOX:[id number] DOB: February 04, 1926 DOA: 05/21/2014 PCP: [name], MD  Assessment/Plan: Active Problems:   HCAP (healthcare-associated pneumonia)    Multifocal pneumonia, HCAP Blood culture no growth so far Patient is immunocompromised , therefore continue cefepime, vancomycin Currently on Low-flow oxygen, not particularly hypoxic Continue to monitor  Terminal myelodysplastic syndrome Oncology recommend blood transfusion threshold at 8 g, transfusion to 2 units of blood each time every 10 days to weekly. Status post transfusion of 2 units of packed red blood cells, hemoglobin 10.2 Repeat CBC every 48 hours Palliative care consultation ordered, patient's daughter Juliann Pulse requesting to meet with hospice of [facility] Patient is a full code for now Will DC aspirin  Diabetic neuropathy Continue gabapentin   DM2.INSULIN DEPENDENT  Uncontrolled, increase Lantus to 20  Q AM  Continue SSI  HTN  Hold norvasc , BP soft  Acute kidney injury-prerenal Improved BUN/creatinine Continue IV hydration   Code Status: full Family Communication: Patient's daughter Juliann Pulse and son and other family members by the bedside Disposition Plan:  As above    Brief narrative: 81 y.o. female with terminal myelodysplastic syndrome, anemia in neoplastic disease, diabetes, hypertension and cervical radiculopathy presents the emergency department complaining of blurry vision for the past month, a cough that is worse in the last 3 days, nasal congestion and feeling fatigued. Hg 7.2 , she had 2 units PRBC on 1/14 , without any significant improvement in her symptoms. Patient has end-stage myelodysplastic syndrome, not responsive to darbopoeitin, prednisone and G-CSF. [name], MD patient's oncologist strongly recommends palliative care. At this time the patient has not made up her mind about going on hospice.  In the ER the chest x-ray showed bilateral  airspace opacities suspicious for multifocal pneumonia and pleural effusions  Consultants:  Palliative care   Procedures:  None   Antibiotics: vanc and cefepime 1/23>>  HPI/Subjective: Patient generally does not feel well has no specific complaints of nausea vomiting chest pain shortness of breath or abdominal pain  Objective: Filed Vitals:   05/22/14 0200 05/22/14 0430 05/22/14 0602 05/22/14 1046  BP: 104/34 100/40 126/41 127/43  Pulse: 82 87 83 93  Temp: 98.4 F (36.9 C) 98.8 F (37.1 C) 98.8 F (37.1 C)   TempSrc: Oral Oral Oral   Resp: 20 20 20    Weight:      SpO2: 93% 94% 94%     Intake/Output Summary (Last 24 hours) at 05/22/14 1225 Last data filed at 05/22/14 0900  Gross per 24 hour  Intake   1190 ml  Output      0 ml  Net   1190 ml    Exam:  General: alert & oriented x 3 In NAD  Cardiovascular: RRR, nl S1 s2  Respiratory: Decreased breath sounds at the bases, scattered rhonchi, no crackles  Abdomen: soft +BS NT/ND, no masses palpable  Extremities: No cyanosis and no edema      Data Reviewed: Basic Metabolic Panel:  Recent Labs Lab 05/21/14 1327 05/21/14 1602 05/22/14 0806  NA 129*  --  132*  K 4.8  --  4.3  CL 95*  --  100  CO2 25  --  25  GLUCOSE 223*  --  229*  BUN 68*  --  51*  CREATININE 1.13*  --  0.90  CALCIUM 8.7  --  8.2*  MG  --  2.1  --     Liver Function Tests:  Recent Labs Lab 05/21/14 1602 05/22/14 8119  AST 66* 28  ALT 55* 39*  ALKPHOS 52 44  BILITOT 1.4* 1.4*  PROT 5.8* 5.3*  ALBUMIN 2.6* 2.3*   No results for input(s): LIPASE, AMYLASE in the last 168 hours. No results for input(s): AMMONIA in the last 168 hours.  CBC:  Recent Labs Lab 05/21/14 1327 05/22/14 0806  WBC 5.4 3.3*  HGB 7.2* 10.2*  HCT 21.1* 29.3*  MCV 82.1 83.0  PLT 23* 33*    Cardiac Enzymes: No results for input(s): CKTOTAL, CKMB, CKMBINDEX, TROPONINI in the last 168 hours. BNP (last 3 results)  Recent Labs  12/05/13 1816   PROBNP 3277.0*     CBG:  Recent Labs Lab 05/21/14 1309 05/21/14 1808 05/21/14 2014 05/22/14 0808 05/22/14 1200  GLUCAP 184* 186* 229* 198* 314*    No results found for this or any previous visit (from the past 240 hour(s)).   Studies: Dg Chest 2 View  05/21/2014   CLINICAL DATA:  Shortness of breath. Cough and congestion. 12/05/2013.  EXAM: CHEST  2 VIEW  COMPARISON:  02/10/2013  FINDINGS: There is a right chest wall port a catheter with tip in the projection of the SVC. Normal heart size. Calcified atherosclerotic plaque involves the thoracic aorta. There are bilateral pleural effusions right greater than left. Multi focal bilateral airspace opacities are identified right greater than left. The visualized osseous structures are unremarkable.  IMPRESSION: 1. Bilateral airspace opacities, right greater than the left. In the acute setting this may represent multifocal pneumonia and or pulmonary edema. 2. Pleural effusions.   Electronically Signed   By: Kerby Moors M.D.   On: 05/21/2014 15:05    Scheduled Meds: . sodium chloride   Intravenous Once  . aspirin EC  81 mg Oral Daily  . ceFEPime (MAXIPIME) IV  1 g Intravenous Q24H  . DULoxetine  30 mg Oral Daily  . gabapentin  100 mg Oral BID  . insulin aspart  0-15 Units Subcutaneous TID WC  . insulin glargine  10 Units Subcutaneous Once  . [START ON 05/23/2014] insulin glargine  20 Units Subcutaneous q morning - 10a  . metoprolol  12.5 mg Oral BID  . pantoprazole  40 mg Oral Daily  . potassium chloride  10 mEq Oral Daily  . sodium chloride  10-40 mL Intracatheter Q12H  . sodium chloride  3 mL Intravenous Q12H  . sodium chloride  3 mL Intravenous Q12H  . vancomycin  500 mg Intravenous Q24H  . vancomycin  1,000 mg Intravenous STAT   Continuous Infusions:   Active Problems:   HCAP (healthcare-associated pneumonia)    Time spent: 44 minutes   [place] [facility] Pager [phone]. If 7PM-7AM, please contact  night-coverage at [email or web address], password [facility] 05/22/2014, 12:25 PM  LOS: 1 day

## 2014-05-23 ENCOUNTER — Ambulatory Visit: Payer: Medicare Other | Admitting: Family Medicine

## 2014-05-23 ENCOUNTER — Other Ambulatory Visit: Payer: Medicare Other

## 2014-05-23 DIAGNOSIS — N179 Acute kidney failure, unspecified: Secondary | ICD-10-CM

## 2014-05-23 LAB — BASIC METABOLIC PANEL
ANION GAP: 5 (ref 5–15)
BUN: 39 mg/dL — AB (ref 6–23)
CHLORIDE: 102 mmol/L (ref 96–112)
CO2: 27 mmol/L (ref 19–32)
Calcium: 8.5 mg/dL (ref 8.4–10.5)
Creatinine, Ser: 0.73 mg/dL (ref 0.50–1.10)
GFR calc Af Amer: 87 mL/min — ABNORMAL LOW (ref >90)
GFR calc non Af Amer: 75 mL/min — ABNORMAL LOW (ref >90)
GLUCOSE: 62 mg/dL — AB (ref 70–99)
Potassium: 4.2 mmol/L (ref 3.5–5.1)
Sodium: 134 mmol/L — ABNORMAL LOW (ref 135–145)

## 2014-05-23 LAB — URINE CULTURE
COLONY COUNT: NO GROWTH
Culture: NO GROWTH

## 2014-05-23 LAB — CBC
HCT: 26.6 % — ABNORMAL LOW (ref 36.0–46.0)
Hemoglobin: 9.2 g/dL — ABNORMAL LOW (ref 12.0–15.0)
MCH: 28.8 pg (ref 26.0–34.0)
MCHC: 34.6 g/dL (ref 30.0–36.0)
MCV: 83.1 fL (ref 78.0–100.0)
PLATELETS: 24 10*3/uL — AB (ref 150–400)
RBC: 3.2 MIL/uL — AB (ref 3.87–5.11)
RDW: 15.6 % — AB (ref 11.5–15.5)
WBC: 3.9 10*3/uL — AB (ref 4.0–10.5)

## 2014-05-23 LAB — TYPE AND SCREEN
ABO/RH(D): AB NEG
ANTIBODY SCREEN: NEGATIVE
DONOR AG TYPE: NEGATIVE
Donor AG Type: NEGATIVE
UNIT DIVISION: 0
UNIT DIVISION: 0

## 2014-05-23 LAB — GLUCOSE, CAPILLARY
GLUCOSE-CAPILLARY: 103 mg/dL — AB (ref 70–99)
Glucose-Capillary: 91 mg/dL (ref 70–99)
Glucose-Capillary: 86 mg/dL (ref 70–99)
Glucose-Capillary: 102 mg/dL — ABNORMAL HIGH (ref 70–99)

## 2014-05-23 MED ORDER — INSULIN GLARGINE 100 UNIT/ML ~~LOC~~ SOLN
15.0000 [IU] | Freq: Every morning | SUBCUTANEOUS | Status: DC
  Filled 2014-05-23: qty 0.15

## 2014-05-23 MED ORDER — LEVOFLOXACIN 750 MG PO TABS
750.0000 mg | ORAL_TABLET | ORAL | Status: DC
  Administered 2014-05-23: 750 mg via ORAL
  Filled 2014-05-23: qty 1

## 2014-05-23 MED ORDER — MORPHINE SULFATE (CONCENTRATE) 10 MG /0.5 ML PO SOLN: 5.0000 mg | ORAL | Status: AC | PRN

## 2014-05-23 MED ORDER — INSULIN GLARGINE 100 UNIT/ML ~~LOC~~ SOLN: 5.0000 [IU] | Freq: Every morning | SUBCUTANEOUS | Status: DC

## 2014-05-23 NOTE — Progress Notes (Signed)
PT Cancellation Note  Patient Details Name: LYNNZIE BLACKSON MRN: [id number] DOB: [birth year]   Cancelled Treatment:    Reason Eval/Treat Not Completed: Other (comment) (MSW note read. will await  decision [id number] residential. If plans for snf/home will evaluate pt  1/26)   Claretha Cooper 05/23/2014, 3:23 PM Tresa Endo PT [phone]

## 2014-05-23 NOTE — Progress Notes (Signed)
[facility] PROGRESS NOTE  Meredith Rodriguez PYP:[id number] DOB: 06-Jun-1925 DOA: 05/21/2014 PCP: [name], MD  Assessment/Plan: Active Problems:   HCAP (healthcare-associated pneumonia)   Palliative care encounter   Weakness generalized   Neuropathic pain    Multifocal pneumonia, HCAP Blood culture no growth so far Stop cefepime, vancomycin, switched to Levaquin Currently on Low-flow oxygen, not particularly hypoxic Continue to monitor  Terminal myelodysplastic syndrome Oncology recommend blood transfusion threshold at 8 g, transfusion to 2 units of blood each time every 10 days to weekly. Status post transfusion of 2 units of packed red blood cells, hemoglobin now trending downward 9.2 Platelet count trending down and 24 Family does not want any further transfusions today Repeat CBC every 48 hours Patient is a DO NOT RESUSCITATE, family considering home with hospice  Diabetic neuropathy Continue gabapentin   DM2.INSULIN DEPENDENT  Degrees Lantus to 15 units today, Continue SSI  HTN  Hold norvasc , BP soft  Acute kidney injury-prerenal Improved BUN/creatinine DC IV fluids   Code Status: full Family Communication: Patient's daughter Juliann Pulse and son and other family members by the bedside Disposition Plan:  Hopefully home tomorrow with hospice   Brief narrative: 81 y.o. female with terminal myelodysplastic syndrome, anemia in neoplastic disease, diabetes, hypertension and cervical radiculopathy presents the emergency department complaining of blurry vision for the past month, a cough that is worse in the last 3 days, nasal congestion and feeling fatigued. Hg 7.2 , she had 2 units PRBC on 1/14 , without any significant improvement in her symptoms. Patient has end-stage myelodysplastic syndrome, not responsive to darbopoeitin, prednisone and G-CSF. Meredith Lark, MD patient's oncologist strongly recommends palliative care. At this time the patient has not made up her  mind about going on hospice.  In the ER the chest x-ray showed bilateral airspace opacities suspicious for multifocal pneumonia and pleural effusions  Consultants:  Palliative care   Procedures:  None   Antibiotics: vanc and cefepime 1/23>>  HPI/Subjective: Patient generally does not feel well , lying comfortably in bed, weak  Objective: Filed Vitals:   05/23/14 0603 05/23/14 0610 05/23/14 1311 05/23/14 1342  BP: 118/38   112/38  Pulse: 96   90  Temp: 99.5 F (37.5 C)   98.9 F (37.2 C)  TempSrc: Oral   Oral  Resp: 16   18  Height:   5\' 6"  (1.676 m)   Weight:      SpO2: 88% 96%  92%    Intake/Output Summary (Last 24 hours) at 05/23/14 1347 Last data filed at 05/23/14 1310  Gross per 24 hour  Intake    250 ml  Output     75 ml  Net    175 ml    Exam:  General: alert & oriented x 3 In NAD  Cardiovascular: RRR, nl S1 s2  Respiratory: Decreased breath sounds at the bases, scattered rhonchi, no crackles  Abdomen: soft +BS NT/ND, no masses palpable  Extremities: No cyanosis and no edema      Data Reviewed: Basic Metabolic Panel:  Recent Labs Lab 05/21/14 1327 05/21/14 1602 05/22/14 0806 05/23/14 0433  NA 129*  --  132* 134*  K 4.8  --  4.3 4.2  CL 95*  --  100 102  CO2 25  --  25 27  GLUCOSE 223*  --  229* 62*  BUN 68*  --  51* 39*  CREATININE 1.13*  --  0.90 0.73  CALCIUM 8.7  --  8.2* 8.5  MG  --  2.1  --   --     Liver Function Tests:  Recent Labs Lab 05/21/14 1602 05/22/14 0806  AST 66* 28  ALT 55* 39*  ALKPHOS 52 44  BILITOT 1.4* 1.4*  PROT 5.8* 5.3*  ALBUMIN 2.6* 2.3*   No results for input(s): LIPASE, AMYLASE in the last 168 hours. No results for input(s): AMMONIA in the last 168 hours.  CBC:  Recent Labs Lab 05/21/14 1327 05/22/14 0806 05/23/14 0433  WBC 5.4 3.3* 3.9*  HGB 7.2* 10.2* 9.2*  HCT 21.1* 29.3* 26.6*  MCV 82.1 83.0 83.1  PLT 23* 33* 24*    Cardiac Enzymes: No results for input(s): CKTOTAL, CKMB,  CKMBINDEX, TROPONINI in the last 168 hours. BNP (last 3 results)  Recent Labs  12/05/13 1816  PROBNP 3277.0*     CBG:  Recent Labs Lab 05/22/14 1200 05/22/14 1757 05/22/14 2117 05/23/14 0830 05/23/14 1151  GLUCAP 314* 177* 106* 91 103*    Recent Results (from the past 240 hour(s))  Culture, blood (routine x 2)     Status: None (Preliminary result)   Collection Time: 05/21/14  3:34 PM  Result Value Ref Range Status   Specimen Description BLOOD LEFT ANTECUBITAL  Final   Special Requests BOTTLES DRAWN AEROBIC AND ANAEROBIC 5CC EACH  Final   Culture   Final           BLOOD CULTURE RECEIVED NO GROWTH TO DATE CULTURE WILL BE HELD FOR 5 DAYS BEFORE ISSUING A FINAL NEGATIVE REPORT Performed at [facility]    Report Status PENDING  Incomplete  Culture, blood (routine x 2)     Status: None (Preliminary result)   Collection Time: 05/21/14  3:34 PM  Result Value Ref Range Status   Specimen Description BLOOD RIGHT ARM  Final   Special Requests BOTTLES DRAWN AEROBIC AND ANAEROBIC 5CC EACH  Final   Culture   Final           BLOOD CULTURE RECEIVED NO GROWTH TO DATE CULTURE WILL BE HELD FOR 5 DAYS BEFORE ISSUING A FINAL NEGATIVE REPORT Performed at [facility]    Report Status PENDING  Incomplete     Studies: Dg Chest 2 View  05/21/2014   CLINICAL DATA:  Shortness of breath. Cough and congestion. 12/05/2013.  EXAM: CHEST  2 VIEW  COMPARISON:  02/10/2013  FINDINGS: There is a right chest wall port a catheter with tip in the projection of the SVC. Normal heart size. Calcified atherosclerotic plaque involves the thoracic aorta. There are bilateral pleural effusions right greater than left. Multi focal bilateral airspace opacities are identified right greater than left. The visualized osseous structures are unremarkable.  IMPRESSION: 1. Bilateral airspace opacities, right greater than the left. In the acute setting this may represent multifocal pneumonia and or pulmonary  edema. 2. Pleural effusions.   Electronically Signed   By: Kerby Moors M.D.   On: 05/21/2014 15:05    Scheduled Meds: . sodium chloride   Intravenous Once  . ceFEPime (MAXIPIME) IV  1 g Intravenous Q24H  . DULoxetine  30 mg Oral Daily  . gabapentin  100 mg Oral BID  . insulin aspart  0-15 Units Subcutaneous TID WC  . [START ON 05/24/2014] insulin glargine  5 Units Subcutaneous q morning - 10a  . metoprolol  12.5 mg Oral BID  . pantoprazole  40 mg Oral Daily  . potassium chloride  10 mEq Oral Daily  . sodium chloride  10-40 mL Intracatheter Q12H  .  sodium chloride  3 mL Intravenous Q12H  . sodium chloride  3 mL Intravenous Q12H  . vancomycin  500 mg Intravenous Q24H   Continuous Infusions:   Active Problems:   HCAP (healthcare-associated pneumonia)   Palliative care encounter   Weakness generalized   Neuropathic pain    Time spent: 40 minutes   [place] [facility] Pager [phone]. If 7PM-7AM, please contact night-coverage at [email or web address], password [facility] 05/23/2014, 1:47 PM  LOS: 2 days

## 2014-05-23 NOTE — Progress Notes (Signed)
ANTIBIOTIC CONSULT NOTE - INITIAL  Pharmacy Consult for levofloxacin Indication: pneumonia  Allergies  Allergen Reactions  . Amoxicillin [Amoxicillin]     Upset stomach  . Cephalexin Nausea Only  . Metformin And Related     Gi problems  . Phenergan [Promethazine Hcl] Other (See Comments)    Unknown   . Procardia [Nifedipine]     dizziness    Patient Measurements: Height: 5\' 6"  (167.6 cm) Weight: 115 lb 8.3 oz (52.4 kg) IBW/kg (Calculated) : 59.3   Vital Signs: Temp: 98.9 F (37.2 C) (01/25 1342) Temp Source: Oral (01/25 1342) BP: 112/38 mmHg (01/25 1342) Pulse Rate: 90 (01/25 1342) Intake/Output from previous day: 01/24 0701 - 01/25 0700 In: 370 [P.O.:360; I.V.:10] Out: -  Intake/Output from this shift: Total I/O In: 120 [P.O.:120] Out: 75 [Urine:75]  Labs:  Recent Labs  05/21/14 1327 05/22/14 0806 05/23/14 0433  WBC 5.4 3.3* 3.9*  HGB 7.2* 10.2* 9.2*  PLT 23* 33* 24*  CREATININE 1.13* 0.90 0.73   Estimated Creatinine Clearance: 41.8 mL/min (by C-G formula based on Cr of 0.73). No results for input(s): VANCOTROUGH, VANCOPEAK, VANCORANDOM, GENTTROUGH, GENTPEAK, GENTRANDOM, TOBRATROUGH, TOBRAPEAK, TOBRARND, AMIKACINPEAK, AMIKACINTROU, AMIKACIN in the last 72 hours.   Microbiology: Recent Results (from the past 720 hour(s))  Culture, blood (routine x 2)     Status: None (Preliminary result)   Collection Time: 05/21/14  3:34 PM  Result Value Ref Range Status   Specimen Description BLOOD LEFT ANTECUBITAL  Final   Special Requests BOTTLES DRAWN AEROBIC AND ANAEROBIC 5CC EACH  Final   Culture   Final           BLOOD CULTURE RECEIVED NO GROWTH TO DATE CULTURE WILL BE HELD FOR 5 DAYS BEFORE ISSUING A FINAL NEGATIVE REPORT Performed at [facility]    Report Status PENDING  Incomplete  Culture, blood (routine x 2)     Status: None (Preliminary result)   Collection Time: 05/21/14  3:34 PM  Result Value Ref Range Status   Specimen Description BLOOD  RIGHT ARM  Final   Special Requests BOTTLES DRAWN AEROBIC AND ANAEROBIC 5CC EACH  Final   Culture   Final           BLOOD CULTURE RECEIVED NO GROWTH TO DATE CULTURE WILL BE HELD FOR 5 DAYS BEFORE ISSUING A FINAL NEGATIVE REPORT Performed at [facility]    Report Status PENDING  Incomplete    Medical History: Past Medical History  Diagnosis Date  . Hypertension   . Diabetes mellitus   . Hyperkalemia   . IBS (irritable bowel syndrome)   . Hypercholesterolemia   . Cervical radiculopathy   . Anemia   . Cancer     leukemia  . Leukemia     Assessment: 53 yoF with COPD on 4L home 02, terminal MDS, anemia, IBS, DM, and cervical radiculopathy presents with CC of persistent cough and SOB. Failed outpt antibiotics last month for cough.  Patient initially on Vanc and cefepime, now pharmacy consulted to dose levaquin.  Patient taking po meds and eating.  Goal of Therapy:  Levofloxacin per renal function  Plan:  Levofloxacin 750mg  po q48h Follow renal function  Dolly Rias RPh 05/23/2014, 2:05 PM Pager [phone]

## 2014-05-23 NOTE — Progress Notes (Signed)
Spoke with Standish from [place] as well as Santiago Glad from [facility].  Met with family this afternoon.  They have decided to stop with blood transfusions and pursue [facility] if available.  Erling Conte to evaluate this afternoon to help make determination for [facility] eligibility. Family does not have additional questions or concerns at this time.  No charge  Doran Clay D.O. Palliative Medicine Team at [facility]  Pager: [phone] Team Phone: [phone]

## 2014-05-23 NOTE — Progress Notes (Signed)
Notified by [facility] Vinnie Level that family had questions regarding [facility] services. Patient had been previously set up to be admitted to [facility] in her home on 1/28. She was admitted to [facility] on 1/23 for HCAP.  Met with family at bedside. Patient remained asleep in the bed until end of family conversations. Present were patient's husband Mikki Santee, daughter Tye Maryland, son Richardson Landry and SIL Liliane Channel. Education provided regarding hospice services, team approach and philosophy of care with good understanding voiced by all.  During visit, family had questions regarding residential hospice. They voiced the choice to not pursue any further blood transfusions, Tye Maryland stated that they have not seen "any difference" with the last 2 transfusions and want to focus on comfort.   They wish to focus on comfort, they do not feel that patient can be cared for at home. Information regarding home care services left with family.  Writer notified [facility] Vinnie Level regarding family's choice for [facility].  Flo Shanks RN, BSN, [facility]) Hospital Nurse  Liaison [phone]

## 2014-05-23 NOTE — Progress Notes (Signed)
Inpatient Diabetes Program Recommendations  AACE/ADA: New Consensus Statement on Inpatient Glycemic Control (2013)  Target Ranges:  Prepandial:   less than 140 mg/dL      Peak postprandial:   less than 180 mg/dL (1-2 hours)      Critically ill patients:  140 - 180 mg/dL    Results for DERRICKA, MERTZ (MRN [id number]) as of 05/23/2014 10:41  Ref. Range 05/22/2014 08:08 05/22/2014 12:00 05/22/2014 17:57 05/22/2014 21:17 05/23/2014 08:30  Glucose-Capillary Latest Range: 70-99 mg/dL 198 (H) 314 (H) 177 (H) 106 (H) 91    Hypoglycemia this am.  Consider decreasing Lantus to 5 units QAM. Decrease Novolog to sensitive tidwc and hs.  Will continue to follow. Thank you. Lorenda Peck, RD, LDN, CDE Inpatient Diabetes Coordinator [phone]

## 2014-05-23 NOTE — Progress Notes (Signed)
Clinical Social Work Department BRIEF PSYCHOSOCIAL ASSESSMENT 05/23/2014  Patient:  Meredith Rodriguez, Meredith Rodriguez     Account Number:  [id number]     Admit date:  05/21/2014  Clinical Social Worker:  Earlie Server  Date/Time:  05/23/2014 03:00 PM  Referred by:  Physician  Date Referred:  05/23/2014 Referred for  Residential hospice placement   Other Referral:   Interview type:  Patient Other interview type:    PSYCHOSOCIAL DATA Living Status:  FAMILY Admitted from facility:   Level of care:   Primary support name:  Herbie [place] Primary support relationship to patient:  SPOUSE Degree of support available:   Strong    CURRENT CONCERNS Current Concerns  Post-Acute Placement   Other Concerns:    SOCIAL WORK ASSESSMENT / PLAN CSW received referral in order to assist with DC planning. CSW reviewed chart and met with patient at bedside. Patient's husband, dtr, son, and son-in-law involved in assessment and patient agreeable for them to be involved with process. CSW introduced myself and explained role.    Patient was living at home with husband prior to admission. This is patient and husband's second marriage and they have been together about 15 years. Patient has a son and dtr from first marriage who are involved with her care. Patient was planning to return home with hospice services but after meeting with hospice team, patient and family are interested in residential hospice placement. CSW offered hospice choice and patient and family prefer [facility]. CSW explained that patient would be evaluated and bed availability would need to be checked. CSW encouraged family to have alternative option in case [facility] is unable to accept patient. Patient and family asked to review list over night and wants to wait to hear from [facility] until they make and further referrals.    CSW and family spoke about alternative plans if hospice is not an option and family reports they are still considering [place] vs SNF.  Family guarded and does not want to discuss any further options until they have received a response from [facility].    CSW will continue to follow.   Assessment/plan status:  Psychosocial Support/Ongoing Assessment of Needs Other assessment/ plan:   Information/referral to community resources:   Hospice choice    PATIENT'S/FAMILY'S RESPONSE TO PLAN OF CARE: Patient alert but minimally engaged during assessment. Patient allowed family to be involved and make decisions. Family feels that patient is most appropriate for hospice and feels that she needs residential placement. Family reports understanding of process but are hesitant to make any further decisions until they have received a response from [facility]. Family is hopeful for hospice because they report they want patient to be comfortable and get the best care.  Family has CSW contact information and report they will call with any further questions.       [place], [place] [phone]

## 2014-05-23 NOTE — Care Management Note (Addendum)
    Page 1 of 1   05/24/2014     1:51:34 PM CARE MANAGEMENT NOTE 05/24/2014  Patient:  Meredith Rodriguez, Meredith Rodriguez   Account Number:  [id number]  Date Initiated:  05/23/2014  Documentation initiated by:  Sunday Spillers  Subjective/Objective Assessment:   81 yo female admitted with PNA, pmh myelodysplastic syndrome. PTA lived at home with spouse.     Action/Plan:   Home with hospice, PTA hospice planned for admission 1/28, called to see while in hospital.   Anticipated DC Date:  05/24/2014   Anticipated DC Plan:  [place]  In-house referral  Hospice / [facility]  CM consult      [place] Choice  Resumption Of Svcs/PTA Provider   Choice offered to / List presented to:  C-4 Adult Children           Status of service:  Completed, signed off Medicare Important Message given?  YES (If response is "NO", the following Medicare IM given date fields will be blank) Date Medicare IM given:  05/24/2014 Medicare IM given by:  [facility] Date Additional Medicare IM given:   Additional Medicare IM given by:    Discharge Disposition:  [place]  Per UR Regulation:  Reviewed for med. necessity/level of care/duration of stay  If discussed at [place] of Stay Meetings, dates discussed:    Comments:  05/24/14 Dessa Phi RN BSN NCM [id number] D/C [place].  05-23-14 Sunday Spillers RN CM 1100 Contacted [facility] regarding OP referral. They state the daughter had arranged with them to be admitted to home hospice on 1/28. Called referral center and then recieved a callback from [place]. Let her know patient was admitted and plan for d/c today, asked if they could come speak with patient and family to assure hospice arrangements were in place.

## 2014-05-23 NOTE — Consult Note (Signed)
Meredith Rodriguez: Received request from [place] for family interest in [place]. Chart reviewed. Met with patient and family. All agreeable to transfer to [facility]. Family completed paper work. CSW aware. Dr. Orpah Melter to assume care per family request. Will need DC summary faxed to [phone]. RN please call report to [phone]. Please arrange transport for patient to arrive before noon if possible. Please leave R chest port accessible for use at [facility]. Will follow up in am. Thank you. Erling Conte, [place]

## 2014-05-23 NOTE — Progress Notes (Signed)
CRITICAL VALUE ALERT  Critical value received:  PLT 24  Date of notification:  [id number]  Time of notification:  0515  Critical value read back:Y  Nurse who received alert:  [facility]  MD notified (1st page): Fredirick Maudlin  Time of first page:  0515  MD notified (2nd page):na  Time of second page:na Responding MD:  Fredirick Maudlin  Time MD responded:  [phone]

## 2014-05-23 NOTE — Progress Notes (Signed)
This shift critical value for platelets of 24. Attending, T. Rogue Bussing notified. Confirmed no active bleeding. No new orders given, will continue to monitor and intervene appropriately.

## 2014-05-24 LAB — GLUCOSE, CAPILLARY
Glucose-Capillary: 57 mg/dL — ABNORMAL LOW (ref 70–99)
Glucose-Capillary: 61 mg/dL — ABNORMAL LOW (ref 70–99)
Glucose-Capillary: 72 mg/dL (ref 70–99)
Glucose-Capillary: 191 mg/dL — ABNORMAL HIGH (ref 70–99)

## 2014-05-24 MED ORDER — LEVALBUTEROL HCL 0.63 MG/3ML IN NEBU: 0.6300 mg | INHALATION_SOLUTION | Freq: Four times a day (QID) | RESPIRATORY_TRACT | Status: AC | PRN

## 2014-05-24 MED ORDER — ACETAMINOPHEN 325 MG PO TABS: 650.0000 mg | ORAL_TABLET | Freq: Four times a day (QID) | ORAL | Status: AC | PRN

## 2014-05-24 MED ORDER — METOPROLOL TARTRATE 25 MG PO TABS: 12.5000 mg | ORAL_TABLET | Freq: Two times a day (BID) | ORAL | Status: AC

## 2014-05-24 MED ORDER — PANTOPRAZOLE SODIUM 40 MG PO TBEC: 40.0000 mg | DELAYED_RELEASE_TABLET | Freq: Every day | ORAL | Status: AC

## 2014-05-24 MED ORDER — DULOXETINE HCL 30 MG PO CPEP: 30.0000 mg | ORAL_CAPSULE | Freq: Every day | ORAL | Status: AC

## 2014-05-24 MED ORDER — LEVOFLOXACIN 750 MG PO TABS: 750.0000 mg | ORAL_TABLET | ORAL | Status: AC

## 2014-05-24 NOTE — Progress Notes (Signed)
PT Cancellation Note  Patient Details Name: Meredith Rodriguez MRN: [id number] DOB: 1925/05/29   Cancelled Treatment:    Reason Eval/Treat Not Completed: PT screened, no needs identified, will sign off (patient to transfer to [facility])   Lipan 05/24/2014, 8:29 AM

## 2014-05-24 NOTE — Discharge Summary (Signed)
Physician Discharge Summary  Meredith Rodriguez MRN: [id number] DOB/AGE: Sep 12, 1925 81 y.o.  PCP: [name], MD   Admit date: 05/21/2014 Discharge date: 05/24/2014  Discharge Diagnoses:  End-stage myelodysplastic syndrome   HCAP (healthcare-associated pneumonia)   Palliative care encounter   Weakness generalized   Neuropathic pain  Follow-up recommendations Patient being discharged to [facility]    Medication List    STOP taking these medications        amLODipine 5 MG tablet  Commonly known as:  NORVASC     aspirin 81 MG tablet     B-D ULTRAFINE III SHORT PEN 31G X 8 MM Misc  Generic drug:  Insulin Pen Needle     cholecalciferol 1000 UNITS tablet  Commonly known as:  VITAMIN D     HUMALOG KWIKPEN 100 UNIT/ML KiwkPen  Generic drug:  insulin lispro     insulin glargine 100 UNIT/ML injection  Commonly known as:  LANTUS     predniSONE 10 MG tablet  Commonly known as:  DELTASONE      TAKE these medications        acetaminophen 325 MG tablet  Commonly known as:  TYLENOL  Take 2 tablets (650 mg total) by mouth every 6 (six) hours as needed for mild pain (or Fever >/= 101).     DULoxetine 30 MG capsule  Commonly known as:  CYMBALTA  Take 1 capsule (30 mg total) by mouth daily.     furosemide 20 MG tablet  Commonly known as:  LASIX  Take 1 tablet (20 mg total) by mouth daily.     gabapentin 100 MG capsule  Commonly known as:  NEURONTIN  Take 2 capsules by mouth 3 times daily.     levalbuterol 0.63 MG/3ML nebulizer solution  Commonly known as:  XOPENEX  Take 3 mLs (0.63 mg total) by nebulization every 6 (six) hours as needed for wheezing or shortness of breath.     levofloxacin 750 MG tablet  Commonly known as:  LEVAQUIN  Take 1 tablet (750 mg total) by mouth every other day.     lidocaine-prilocaine cream  Commonly known as:  EMLA  Apply 1 application topically as needed (to access port).     metoprolol tartrate 25 MG tablet  Commonly known  as:  LOPRESSOR  Take 0.5 tablets (12.5 mg total) by mouth 2 (two) times daily.     morphine CONCENTRATE 10 mg / 0.5 ml concentrated solution  Take 0.25 mLs (5 mg total) by mouth every 2 (two) hours as needed for severe pain.     ondansetron 4 MG disintegrating tablet  Commonly known as:  ZOFRAN ODT  Take 1 tablet (4 mg total) by mouth every 8 (eight) hours as needed for nausea or vomiting.     ONETOUCH DELICA LANCETS 78H Misc  USE AS DIRECTED. DX. 250.00     pantoprazole 40 MG tablet  Commonly known as:  PROTONIX  Take 1 tablet (40 mg total) by mouth daily.     potassium chloride 10 MEQ tablet  Commonly known as:  KLOR-CON 10  Take 1 tablet (10 mEq total) by mouth daily.        Discharge Condition:   Disposition: Hospice home   Consults: Palliative care   Significant Diagnostic Studies: Dg Chest 2 View  05/21/2014   CLINICAL DATA:  Shortness of breath. Cough and congestion. 12/05/2013.  EXAM: CHEST  2 VIEW  COMPARISON:  02/10/2013  FINDINGS: There is a right chest  wall port a catheter with tip in the projection of the SVC. Normal heart size. Calcified atherosclerotic plaque involves the thoracic aorta. There are bilateral pleural effusions right greater than left. Multi focal bilateral airspace opacities are identified right greater than left. The visualized osseous structures are unremarkable.  IMPRESSION: 1. Bilateral airspace opacities, right greater than the left. In the acute setting this may represent multifocal pneumonia and or pulmonary edema. 2. Pleural effusions.   Electronically Signed   By: Kerby Moors M.D.   On: 05/21/2014 15:05      Microbiology: Recent Results (from the past 240 hour(s))  Culture, blood (routine x 2)     Status: None (Preliminary result)   Collection Time: 05/21/14  3:34 PM  Result Value Ref Range Status   Specimen Description BLOOD LEFT ANTECUBITAL  Final   Special Requests BOTTLES DRAWN AEROBIC AND ANAEROBIC 5CC EACH  Final   Culture    Final           BLOOD CULTURE RECEIVED NO GROWTH TO DATE CULTURE WILL BE HELD FOR 5 DAYS BEFORE ISSUING A FINAL NEGATIVE REPORT Performed at [facility]    Report Status PENDING  Incomplete  Culture, blood (routine x 2)     Status: None (Preliminary result)   Collection Time: 05/21/14  3:34 PM  Result Value Ref Range Status   Specimen Description BLOOD RIGHT ARM  Final   Special Requests BOTTLES DRAWN AEROBIC AND ANAEROBIC 5CC EACH  Final   Culture   Final           BLOOD CULTURE RECEIVED NO GROWTH TO DATE CULTURE WILL BE HELD FOR 5 DAYS BEFORE ISSUING A FINAL NEGATIVE REPORT Performed at [facility]    Report Status PENDING  Incomplete  Urine culture     Status: None   Collection Time: 05/22/14  8:45 AM  Result Value Ref Range Status   Specimen Description URINE, CLEAN CATCH  Final   Special Requests NONE  Final   Colony Count NO GROWTH Performed at [facility]   Final   Culture NO GROWTH Performed at [facility]   Final   Report Status 05/23/2014 FINAL  Final     Labs: Results for orders placed or performed during the hospital encounter of 05/21/14 (from the past 48 hour(s))  Glucose, capillary     Status: Abnormal   Collection Time: 05/22/14 12:00 PM  Result Value Ref Range   Glucose-Capillary 314 (H) 70 - 99 mg/dL   Comment 1 Notify RN   Glucose, capillary     Status: Abnormal   Collection Time: 05/22/14  5:57 PM  Result Value Ref Range   Glucose-Capillary 177 (H) 70 - 99 mg/dL   Comment 1 Notify RN   Glucose, capillary     Status: Abnormal   Collection Time: 05/22/14  9:17 PM  Result Value Ref Range   Glucose-Capillary 106 (H) 70 - 99 mg/dL   Comment 1 Notify RN   CBC     Status: Abnormal   Collection Time: 05/23/14  4:33 AM  Result Value Ref Range   WBC 3.9 (L) 4.0 - 10.5 K/uL    Comment: WHITE COUNT CONFIRMED ON SMEAR   RBC 3.20 (L) 3.87 - 5.11 MIL/uL   Hemoglobin 9.2 (L) 12.0 - 15.0 g/dL   HCT 26.6 (L) 36.0 - 46.0 %    MCV 83.1 78.0 - 100.0 fL   MCH 28.8 26.0 - 34.0 pg   MCHC 34.6 30.0 -  36.0 g/dL   RDW 15.6 (H) 11.5 - 15.5 %   Platelets 24 (LL) 150 - 400 K/uL    Comment: SPECIMEN CHECKED FOR CLOTS REPEATED TO VERIFY PLATELET COUNT CONFIRMED BY SMEAR CRITICAL RESULT CALLED TO, READ BACK BY AND VERIFIED WITH: Jonette Eva RN AT 0500 ON 01.25.16 BY SHUEA DELTA CHECK NOTED   Basic metabolic panel     Status: Abnormal   Collection Time: 05/23/14  4:33 AM  Result Value Ref Range   Sodium 134 (L) 135 - 145 mmol/L   Potassium 4.2 3.5 - 5.1 mmol/L   Chloride 102 96 - 112 mmol/L   CO2 27 19 - 32 mmol/L   Glucose, Bld 62 (L) 70 - 99 mg/dL   BUN 39 (H) 6 - 23 mg/dL   Creatinine, Ser 0.73 0.50 - 1.10 mg/dL   Calcium 8.5 8.4 - 10.5 mg/dL   GFR calc non Af Amer 75 (L) >90 mL/min   GFR calc Af Amer 87 (L) >90 mL/min    Comment: (NOTE) The eGFR has been calculated using the CKD EPI equation. This calculation has not been validated in all clinical situations. eGFR's persistently <90 mL/min signify possible Chronic Kidney Disease.    Anion gap 5 5 - 15  Glucose, capillary     Status: None   Collection Time: 05/23/14  8:30 AM  Result Value Ref Range   Glucose-Capillary 91 70 - 99 mg/dL  Glucose, capillary     Status: Abnormal   Collection Time: 05/23/14 11:51 AM  Result Value Ref Range   Glucose-Capillary 103 (H) 70 - 99 mg/dL  Glucose, capillary     Status: None   Collection Time: 05/23/14  5:20 PM  Result Value Ref Range   Glucose-Capillary 86 70 - 99 mg/dL  Glucose, capillary     Status: Abnormal   Collection Time: 05/23/14  9:19 PM  Result Value Ref Range   Glucose-Capillary 102 (H) 70 - 99 mg/dL  Glucose, capillary     Status: Abnormal   Collection Time: 05/24/14  7:31 AM  Result Value Ref Range   Glucose-Capillary 57 (L) 70 - 99 mg/dL   Comment 1 Notify RN   Glucose, capillary     Status: Abnormal   Collection Time: 05/24/14  7:47 AM  Result Value Ref Range   Glucose-Capillary 61 (L) 70 -  99 mg/dL   Comment 1 Notify RN   Glucose, capillary     Status: None   Collection Time: 05/24/14  8:08 AM  Result Value Ref Range   Glucose-Capillary 72 70 - 99 mg/dL   Comment 1 Notify RN      HPI :*81 y.o. female with terminal myelodysplastic syndrome, anemia in neoplastic disease, diabetes, hypertension and cervical radiculopathy presents the emergency department complaining of blurry vision for the past month, a cough that is worse in the last 3 days, nasal congestion and feeling fatigued. Hg 7.2 , she had 2 units PRBC on 1/14 , without any significant improvement in her symptoms. Patient has end-stage myelodysplastic syndrome, not responsive to darbopoeitin, prednisone and G-CSF. [name], MD patient's oncologist strongly recommends palliative care. At this time the patient has not made up her mind about going on hospice.  In the ER the chest x-ray showed bilateral airspace opacities suspicious for multifocal pneumonia and pleural effusions  HOSPITAL COURSE:  Multifocal pneumonia, HCAP Blood culture no growth so far Stop cefepime, vancomycin, switched to Levaquin every 48 hours 3 doses Currently on Low-flow oxygen, not particularly  hypoxic Aspiration precautions  Terminal myelodysplastic syndrome Oncology recommend blood transfusion threshold at 8 g, transfusion to 2 units of blood each time every 10 days to weekly. Status post transfusion of 2 units of packed red blood cells 1/23, hemoglobin now trending downward 9.2 on 1/25 Platelet count of 24 Seen by palliative care, they recommend DO NOT RESUSCITATE, Patient being discharged to [facility]   Diabetic neuropathy Continue gabapentin   DM2.INSULIN DEPENDENT  Degrees Lantus to 15 units today, Continue SSI  HTN  Discontinued Norvasc Decrease dose of metoprolol, Continue Lasix for comfort   Acute kidney injury-prerenal Improved BUN/creatinine DC IV fluids    Discharge Exam:    Blood pressure 116/39, pulse  92, temperature 98.2 F (36.8 C), temperature source Oral, resp. rate 18, height $RemoveBe'5\' 6"'WapFTmyBN$  (1.676 m), weight 52.4 kg (115 lb 8.3 oz), SpO2 98 %.   General: alert & oriented x 3 In NAD   Cardiovascular: RRR, nl S1 s2   Respiratory: Decreased breath sounds at the bases, scattered rhonchi, no crackles   Abdomen: soft +BS NT/ND, no masses palpable   Extremities: No cyanosis and no edema       Discharge Instructions    Diet - low sodium heart healthy    Complete by:  As directed      Increase activity slowly    Complete by:  As directed              Signed: Eri Mcevers 05/24/2014, 10:05 AM

## 2014-05-24 NOTE — Progress Notes (Signed)
Clinical Social Work  CSW faxed DC summary to [employer]. RN to call report. CSW prepared DC packet with DNR, hard scripts, PTAR forms, and DC summary included. CSW informed patient and family of DC plans at bedside. Family reports they are very happy that patient was accepted to [facility] and feels it will be the best placement for patient. Patient and family agreeable to [facility] for transportation. PTAR arranged, request #: [id number].  CSW is signing off but available if needed.  [place], [place] [phone]

## 2014-05-27 ENCOUNTER — Ambulatory Visit: Payer: Medicare Other | Admitting: Family Medicine

## 2014-05-27 LAB — CULTURE, BLOOD (ROUTINE X 2)
CULTURE: NO GROWTH
CULTURE: NO GROWTH

## 2014-05-30 ENCOUNTER — Ambulatory Visit (HOSPITAL_COMMUNITY)
Admission: RE | Admit: 2014-05-30 | Discharge: 2014-05-30 | Disposition: A | Discharge status: Home / Self Care | Payer: Medicare Other | Source: Ambulatory Visit | Attending: Oncology | Admitting: Oncology

## 2014-05-30 ENCOUNTER — Other Ambulatory Visit: Payer: Self-pay | Admitting: Family Medicine

## 2014-05-30 DEATH — deceased

## 2014-06-02 ENCOUNTER — Other Ambulatory Visit: Payer: Medicare Other

## 2014-06-13 ENCOUNTER — Other Ambulatory Visit: Payer: Medicare Other

## 2014-06-23 ENCOUNTER — Other Ambulatory Visit: Payer: Medicare Other

## 2014-07-04 ENCOUNTER — Other Ambulatory Visit: Payer: Medicare Other

## 2014-07-14 ENCOUNTER — Ambulatory Visit: Payer: Medicare Other | Admitting: Hematology and Oncology

## 2014-07-14 ENCOUNTER — Other Ambulatory Visit: Payer: Medicare Other

## 2015-10-10 IMAGING — CR DG CHEST 2V
2 series · 2 of 2 positions shown · non-contrast
Comparison: 02/11/2013

CLINICAL DATA: Shortness of breath, weakness, leukemia

EXAM:
CHEST  2 VIEW

[w chest pa]
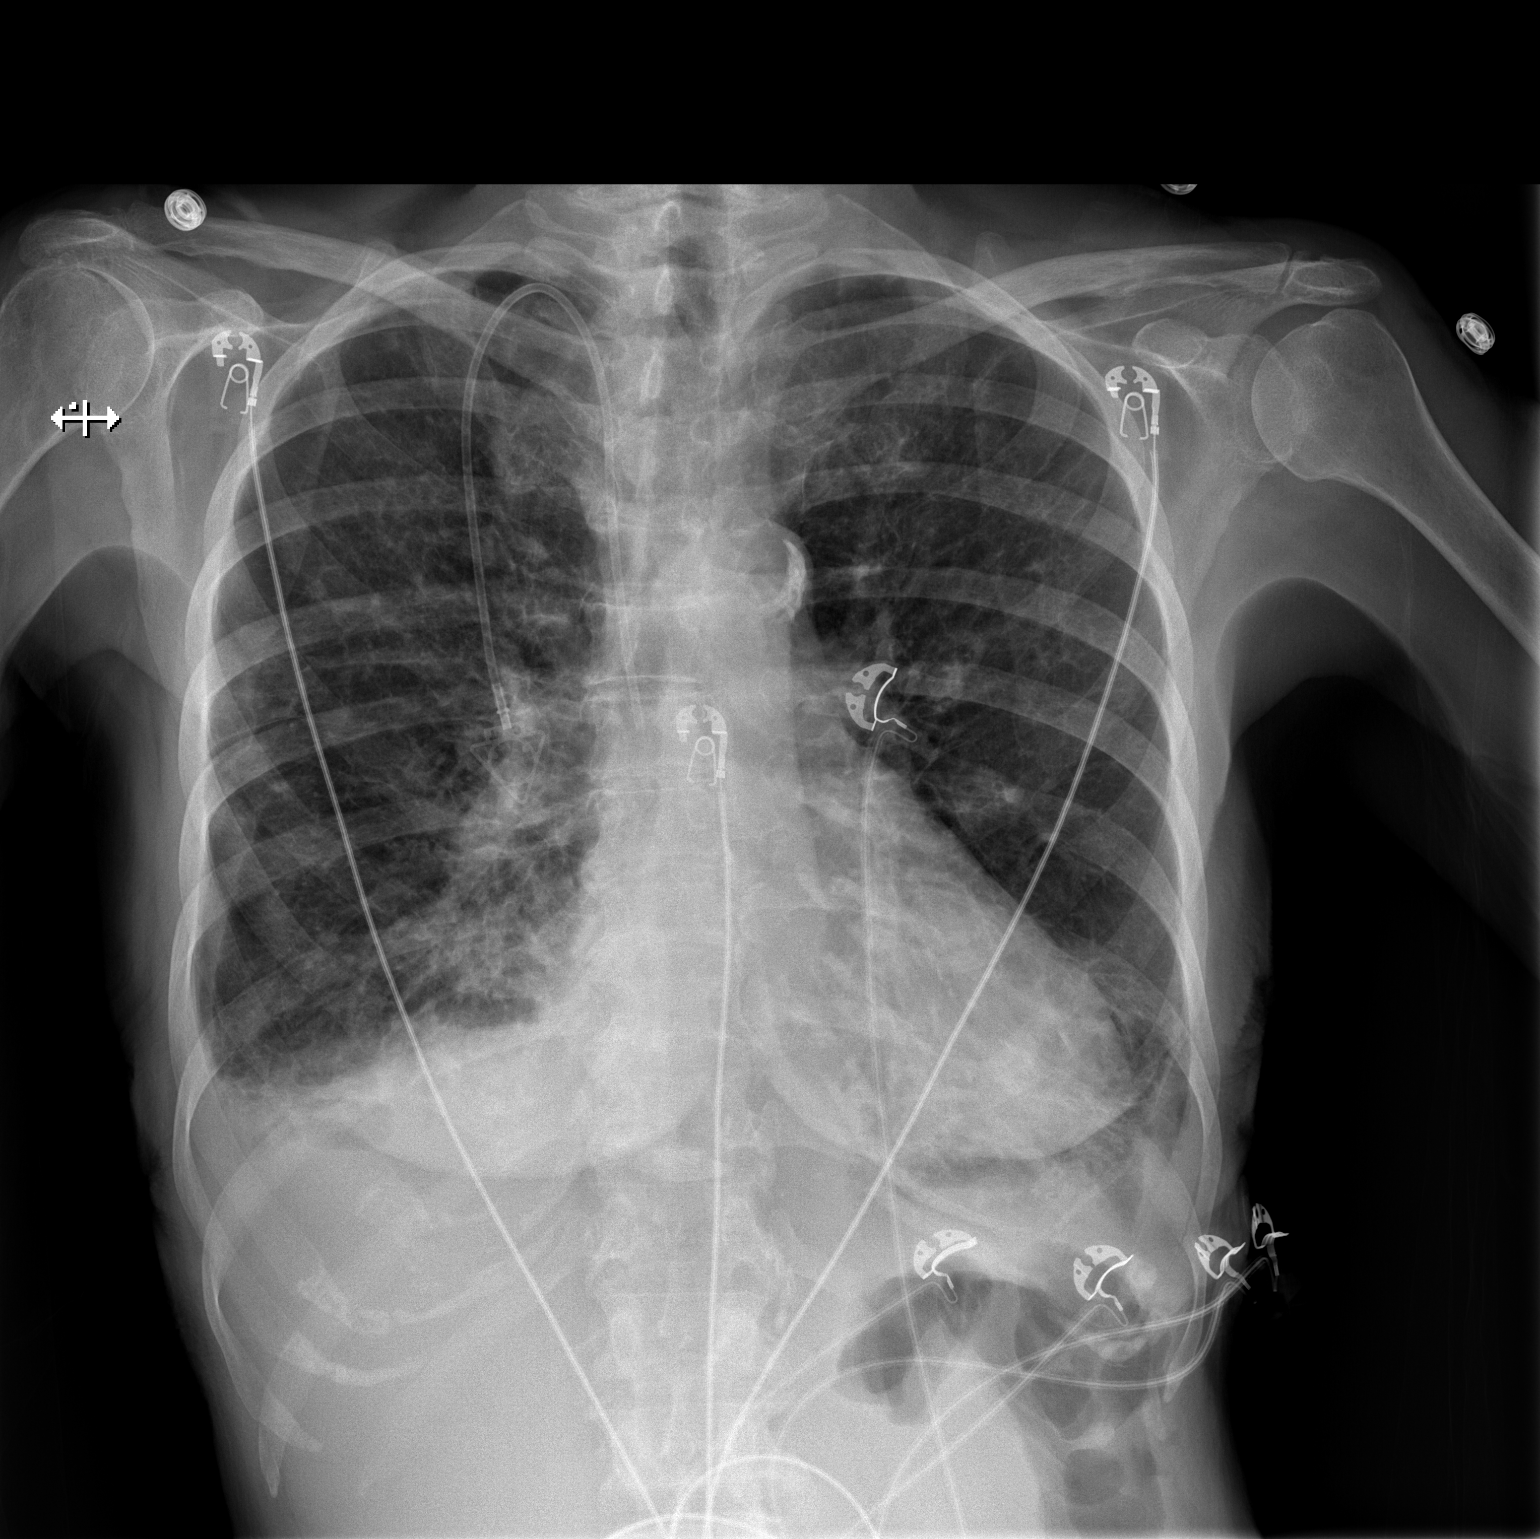

[w chest lat]
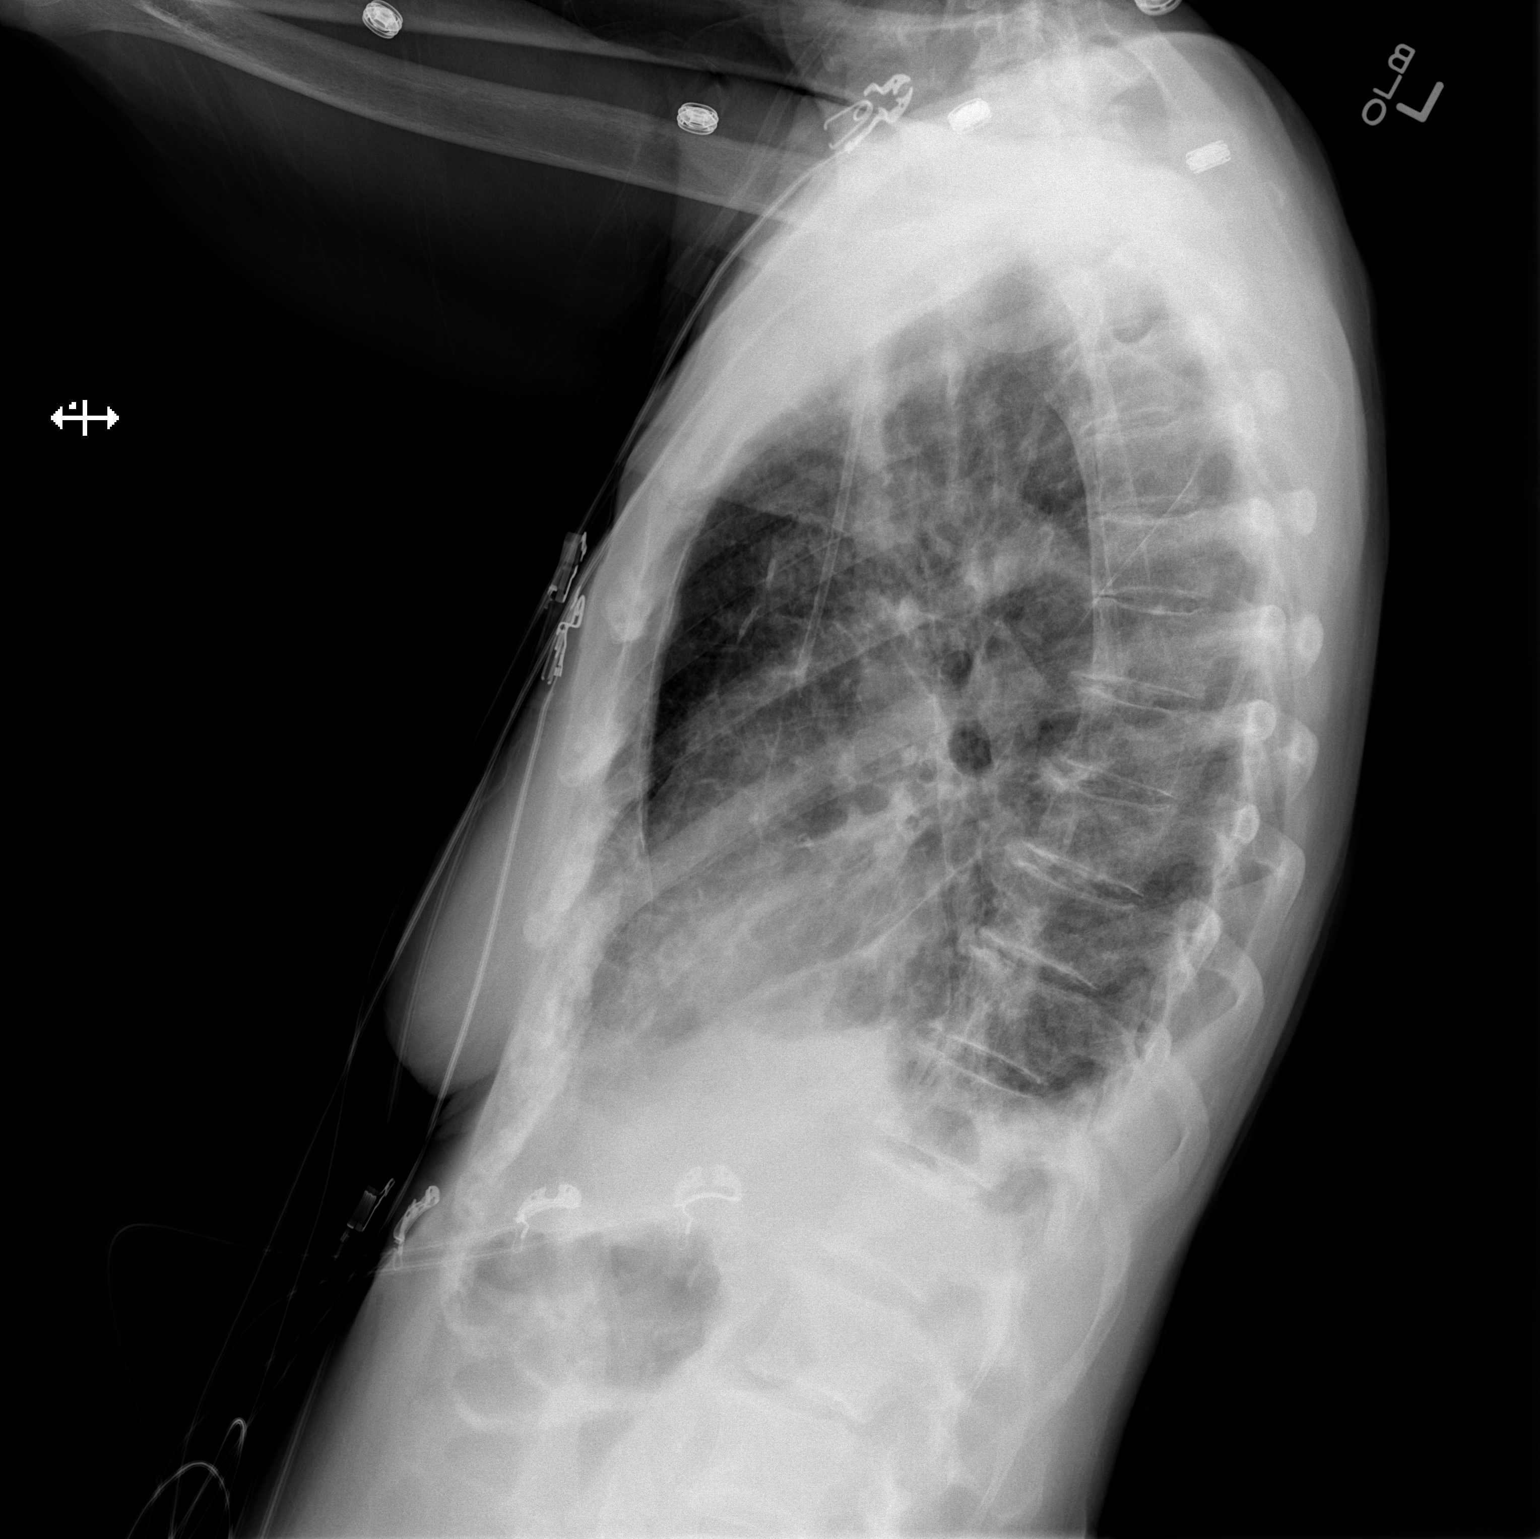

[2 of 2 positions shown; findings below may reference images not displayed]

FINDINGS: Mild patchy bilateral lower lobe opacities, atelectasis versus
pneumonia. No frank interstitial edema. Small right pleural
effusion. No pneumothorax.

The heart is normal in size.

Right chest power port terminating at the cavoatrial junction.
IMPRESSION: Mild patchy bilateral lower lobe opacities, atelectasis versus
pneumonia.

Small right pleural effusion.
